# Patient Record
Sex: Male | Born: 1965 | Race: White | Hispanic: No | Marital: Married | Smoking: Former smoker
Health system: Southern US, Community
[De-identification: ages and names within clinical notes are randomized; demographics above are authoritative.]

## PROBLEM LIST (undated history)

## (undated) ENCOUNTER — Emergency Department (HOSPITAL_COMMUNITY): Payer: BC Managed Care – PPO | Source: Home / Self Care

## (undated) DIAGNOSIS — C61 Malignant neoplasm of prostate: Secondary | ICD-10-CM

## (undated) DIAGNOSIS — Z8709 Personal history of other diseases of the respiratory system: Secondary | ICD-10-CM

## (undated) DIAGNOSIS — J45909 Unspecified asthma, uncomplicated: Secondary | ICD-10-CM

## (undated) DIAGNOSIS — Z973 Presence of spectacles and contact lenses: Secondary | ICD-10-CM

## (undated) DIAGNOSIS — E119 Type 2 diabetes mellitus without complications: Secondary | ICD-10-CM

## (undated) DIAGNOSIS — C801 Malignant (primary) neoplasm, unspecified: Secondary | ICD-10-CM

## (undated) DIAGNOSIS — I10 Essential (primary) hypertension: Secondary | ICD-10-CM

## (undated) HISTORY — PX: EYE SURGERY: SHX253

## (undated) HISTORY — PX: TONSILLECTOMY: SUR1361

## (undated) HISTORY — PX: OTHER SURGICAL HISTORY: SHX169

---

## 2001-06-11 ENCOUNTER — Emergency Department (HOSPITAL_COMMUNITY): Admission: EM | Admit: 2001-06-11 | Discharge: 2001-06-11 | Payer: Self-pay | Admitting: *Deleted

## 2005-02-20 ENCOUNTER — Ambulatory Visit: Payer: Self-pay | Admitting: Internal Medicine

## 2005-07-06 ENCOUNTER — Ambulatory Visit: Payer: Self-pay | Admitting: Internal Medicine

## 2005-07-30 ENCOUNTER — Ambulatory Visit: Payer: Self-pay | Admitting: Internal Medicine

## 2006-10-02 ENCOUNTER — Ambulatory Visit: Payer: Self-pay | Admitting: Family Medicine

## 2008-03-27 ENCOUNTER — Telehealth: Payer: Self-pay | Admitting: *Deleted

## 2008-08-23 ENCOUNTER — Ambulatory Visit: Payer: Self-pay | Admitting: Internal Medicine

## 2008-08-23 DIAGNOSIS — E109 Type 1 diabetes mellitus without complications: Secondary | ICD-10-CM | POA: Insufficient documentation

## 2008-08-23 DIAGNOSIS — R062 Wheezing: Secondary | ICD-10-CM | POA: Insufficient documentation

## 2009-05-13 DIAGNOSIS — E785 Hyperlipidemia, unspecified: Secondary | ICD-10-CM | POA: Insufficient documentation

## 2009-08-22 ENCOUNTER — Encounter: Payer: Self-pay | Admitting: Family Medicine

## 2009-09-21 ENCOUNTER — Ambulatory Visit: Payer: Self-pay | Admitting: Family Medicine

## 2009-09-21 DIAGNOSIS — J309 Allergic rhinitis, unspecified: Secondary | ICD-10-CM | POA: Insufficient documentation

## 2009-09-21 DIAGNOSIS — J019 Acute sinusitis, unspecified: Secondary | ICD-10-CM

## 2009-09-23 ENCOUNTER — Ambulatory Visit: Payer: Self-pay | Admitting: Internal Medicine

## 2009-09-23 DIAGNOSIS — J45909 Unspecified asthma, uncomplicated: Secondary | ICD-10-CM | POA: Insufficient documentation

## 2009-09-23 DIAGNOSIS — R0789 Other chest pain: Secondary | ICD-10-CM | POA: Insufficient documentation

## 2009-09-26 ENCOUNTER — Telehealth (INDEPENDENT_AMBULATORY_CARE_PROVIDER_SITE_OTHER): Payer: Self-pay | Admitting: *Deleted

## 2009-10-01 ENCOUNTER — Telehealth (INDEPENDENT_AMBULATORY_CARE_PROVIDER_SITE_OTHER): Payer: Self-pay | Admitting: *Deleted

## 2009-10-02 ENCOUNTER — Ambulatory Visit: Payer: Self-pay | Admitting: Cardiology

## 2009-10-02 ENCOUNTER — Encounter: Payer: Self-pay | Admitting: Internal Medicine

## 2009-10-02 ENCOUNTER — Encounter (HOSPITAL_COMMUNITY): Admission: RE | Admit: 2009-10-02 | Discharge: 2009-12-17 | Payer: Self-pay | Admitting: Internal Medicine

## 2009-10-02 ENCOUNTER — Ambulatory Visit: Payer: Self-pay

## 2009-11-08 ENCOUNTER — Encounter: Payer: Self-pay | Admitting: Family Medicine

## 2009-11-18 ENCOUNTER — Ambulatory Visit: Payer: Self-pay | Admitting: Family Medicine

## 2010-02-25 ENCOUNTER — Ambulatory Visit: Payer: Self-pay | Admitting: Internal Medicine

## 2010-02-25 DIAGNOSIS — J069 Acute upper respiratory infection, unspecified: Secondary | ICD-10-CM | POA: Insufficient documentation

## 2010-07-15 NOTE — Assessment & Plan Note (Signed)
Summary: Sinusitis, rhinitis   Vital Signs:  Patient profile:   45 year old male Weight:      199 pounds O2 Sat:      96 % on Room air Temp:     100.3 degrees F oral Pulse rate:   91 / minute BP sitting:   148 / 84  (left arm) Cuff size:   large  Vitals Entered By: Payton Spark CMA (September 21, 2009 12:46 PM)  O2 Flow:  Room air CC: ? Sinus infction x 1 week. All OTC treatments not helping.    CC:  ? Sinus infction x 1 week. All OTC treatments not helping. [name]  History of Present Illness: ? Sinus infction x 1 week. All OTC treatments not helping.   Pain and pressure over the nasal bridge.  Soreness. Worse onteh left side.  Sxs started last Saturday. Nasal congestin.  Clear nasal d/c.  Used a nettipot this AM and didn't really help.  No ear pain or pressure. Had ST about 2 days ago but it better.  Yesterday took 2 sudafed.  Never checked his temperature. Feels he is about the same.   Current Medications (verified): 1)  Lantus Solostar 100 Unit/ml Soln (Insulin Glargine) 2)  Humalog 100 Unit/ml Soln (Insulin Lispro (Human)) 3)  Altace 10 Mg Caps (Ramipril) 4)  Proair Hfa 108 (90 Base) Mcg/act Aers (Albuterol Sulfate) .... Use As Directed As Needed  Allergies (verified): No Known Drug Allergies  Physical Exam  General:  Well-developed,well-nourished,in no acute distress; alert,appropriate and cooperative throughout examination Head:  Normocephalic and atraumatic without obvious abnormalities. No apparent alopecia or balding. Tender over the nasal bride and left maxillary sinus.  Eyes:  No corneal or conjunctival inflammation noted. EOMI. Perrla.  Ears:  External ear exam shows no significant lesions or deformities.  Otoscopic examination reveals clear canals, tympanic membranes are intact bilaterally without bulging, retraction, inflammation or discharge. Hearing is grossly normal bilaterally. Nose:  External nasal examination shows no deformity or inflammation. Nasal mucosa are  pink and moist without lesions or exudates. Mouth:  Oral mucosa and oropharynx without lesions or exudates.  Teeth in good repair. Neck:  No deformities, masses, or tenderness noted. Lungs:  Normal respiratory effort, chest expands symmetrically. Lungs are clear to auscultation, no crackles or wheezes. Heart:  Normal rate and regular rhythm. S1 and S2 normal without gallop, murmur, click, rub or other extra sounds. Skin:  no rashes.   Cervical Nodes:  No lymphadenopathy noted Psych:  Cognition and judgment appear intact. Alert and cooperative with normal attention span and concentration. No apparent delusions, illusions, hallucinations   Impression & Recommendations:  Problem # 1:  SINUSITIS - ACUTE-NOS (ICD-461.9) Dsicussed that we often don't tx with ABx unless has had sxs for 10 days but since not gettig better and his sxs are worse on one side will treat.  Call if not better in one week. Discussed starting his singulair and I do think part of his sxs are allergic and thi will help him get better more quickly is he treats his allergies as well.  The following medications were removed from the medication list:    Azithromycin 500 Mg Tabs (Azithromycin) .[name]... 2 by mouth x 1 then 1 by mouth once daily for another 4 days His updated medication list for this problem includes:    Amoxicillin 875 Mg Tabs (Amoxicillin) .[name]... Take 1 tablet by mouth two times a day for 10 days  Instructed on treatment. Call if symptoms  persist or worsen.   Problem # 2:  RHINITIS (ICD-477.9) Discussed starting his singulair and I do think part of his sxs are allergic and thi will help him get better more quickly is he treats his allergies as well.   Also highly recommended he be formally tested for asthma. Says Dr. Fabian Sharp has discussed this with him but he just hasn't done it.   Complete Medication List: 1)  Lantus Solostar 100 Unit/ml Soln (Insulin glargine) 2)  Humalog 100 Unit/ml Soln (Insulin lispro  (human)) 3)  Altace 10 Mg Caps (Ramipril) 4)  Proair Hfa 108 (90 Base) Mcg/act Aers (Albuterol sulfate) .... Use as directed as needed 5)  Amoxicillin 875 Mg Tabs (Amoxicillin) .... Take 1 tablet by mouth two times a day for 10 days Prescriptions: AMOXICILLIN 875 MG TABS (AMOXICILLIN) Take 1 tablet by mouth two times a day for 10 days  #20 x 0   Entered and Authorized by:   Nani Gasser MD   Signed by:   Nani Gasser MD on 09/21/2009   Method used:   Electronically to        [facility]  [address] [phone]* (retail)       [address]       [place], [place]  [address]       Ph: [phone] or [phone]       Fax: [phone]   RxID:   [id number]

## 2010-07-15 NOTE — Progress Notes (Signed)
Summary: Nuclear Pre-Procedure  Phone Note Outgoing Call Call back at Work Phone [phone]   Call placed by: Stanton Kidney, EMT-P,  October 01, 2009 1:45 PM Action Taken: Phone Call Completed Summary of Call: Left message with information on Myoview Information Sheet (see scanned document for details).     Nuclear Med Background Indications for Stress Test: Evaluation for Ischemia   History: Asthma   Symptoms: Chest Pain, SOB    Nuclear Pre-Procedure Cardiac Risk Factors: History of Smoking, IDDM Type 1 Height (in): 70

## 2010-07-15 NOTE — Assessment & Plan Note (Signed)
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   Dr. Evlyn Kanner is his endocrinologist.   Preventive Screening-Counseling & Management  Alcohol-Tobacco     Alcohol drinks/day: <1     >5/day in last 3 mos: Socially     Smoking Status: quit     Year Quit: 20 years ago  Caffeine-Diet-Exercise     Caffeine use/day: 2 diet coke     Does Patient Exercise: yes     Type of exercise: wts and crunches     Exercise (avg: min/session): <30     Times/week: 3  Hep-HIV-STD-Contraception     HIV Risk: no risk noted     STD Risk: no risk noted     Testicular SE Education/Counseling to perform regular STE      Sexual History:  currently monogamous.        Drug Use:  never.    Allergies (verified): No Known Drug Allergies  Past History:  Past medical, surgical, family and social histories (including risk factors) reviewed, and no changes noted (except as noted below).  Past Medical History: Reviewed history from 08/23/2008 and no changes required. Diabetes mellitus, type I hx of wheezing ? rad   Past Surgical History: Reviewed history from 08/23/2008 and no changes required. 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 Evlyn Kanner every 3 months   Family History: Reviewed history from 09/23/2009 and no changes required. Father: type 2 diabetes Mother: liver cancer Siblings: no  adopted   Social  History: Reviewed history from 09/23/2009 and no changes required. Married Former Smoker hh  of  3  STD Risk:  no risk noted HIV Risk:  no risk noted Sexual History:  currently monogamous Drug Use:  never  Review of Systems      See HPI General:  Denies chills, fatigue, and fever. Eyes:  Denies blurring. ENT:  Denies sinus pressure and sore throat. CV:  Denies chest pain or discomfort. Resp:  Denies cough. GI:  Denies nausea and vomiting. GU:  Denies decreased libido, dysuria, and erectile dysfunction. MS:  Denies joint pain. Derm:  does have a freely mobile area on the R upper shoulder. Neuro:  Denies tingling. Psych:  Denies anxiety and depression. Endo:  See HPI; DM, seeing endrocrine. Heme:  Denies abnormal bruising, enlarge lymph nodes, and fevers. Allergy:  Denies seasonal allergies.   Impression & Recommendations:  Problem # 1:  HEALTH MAINTENANCE EXAM (ICD-V70.0) The patient's preventative maintenance and recommended screening tests for an annual wellness exam were reviewed in full today. Brought up to date unless services declined.  Counselled on the importance of diet, exercise, and its role in overall health and mortality. The patient's FH and SH was reviewed, including their home life, tobacco status, and drug and alcohol status.   Add PSA to  labs from endocrine  Complete Medication List: 1)  Lantus Solostar 100 Unit/ml Soln (Insulin glargine) 2)  Humalog 100 Unit/ml Soln (Insulin lispro (human)) 3)  Altace 10 Mg Caps (Ramipril) 4)  Proair Hfa 108 (90 Base) Mcg/act Aers (Albuterol sulfate) .... Use as directed as needed  Current Allergies (reviewed today): No known allergies   Physical Exam General Appearance: well developed, well nourished, no acute distress Eyes: conjunctiva and lids normal, PERRLA, EOMI Ears, Nose, Mouth, Throat: TM clear, nares clear, oral exam WNL Neck: supple, no lymphadenopathy, no thyromegaly, no JVD Respiratory: clear to  auscultation and percussion, respiratory effort normal Cardiovascular: regular rate and rhythm, S1-S2, no murmur, rub or gallop, no bruits, peripheral pulses normal and symmetric, no cyanosis, clubbing, edema or varicosities Chest: no scars, masses, tenderness; no asymmetry, skin changes, nipple discharge, no gynecomastia   Gastrointestinal: soft, non-tender; no hepatosplenomegaly, masses; active bowel sounds all quadrants, guaiac negative stool; no masses, tenderness, hemorrhoids  Genitourinary: no hernia, testicular mass, penile discharge, priapism or prostate enlargement Lymphatic: no cervical, axillary or inguinal adenopathy Musculoskeletal: gait normal, muscle tone and strength WNL, no joint swelling, effusions, discoloration, crepitus  Skin: clear, good turgor, color WNL, no rashes, lesions, or ulcerations Neurologic: normal mental status, normal reflexes, normal strength, sensation, and motion Psychiatric: alert; oriented to person, place and time Other Exam:

## 2010-07-15 NOTE — Assessment & Plan Note (Signed)
Summary: Cardiology Nuclear Study  Nuclear Med Background Indications for Stress Test: Evaluation for Ischemia   History: Asthma   Symptoms: Chest Pain, DOE, SOB    Nuclear Pre-Procedure Cardiac Risk Factors: History of Smoking, IDDM Type 1 Caffeine/Decaff Intake: None NPO After: 8:00 AM Lungs: clear IV 0.9% NS with Angio Cath: 22g     IV Site: (R) AC IV Started by: Irean Hong RN Chest Size (in) 44     Height (in): 70 Weight (lb): 200 BMI: 28.80 Tech Comments: FBS 228 at 11:30 am.  Nuclear Med Study 1 or 2 day study:  1 day     Stress Test Type:  Stress Reading MD:  Olga Millers, MD     Referring MD:  W.[facility] Resting Radionuclide:  Technetium 8m Tetrofosmin     Resting Radionuclide Dose:  11 mCi  Stress Radionuclide:  Technetium 2m Tetrofosmin     Stress Radionuclide Dose:  33 mCi   Stress Protocol Exercise Time (min):  8:01 min     Max HR:  157 bpm     Predicted Max HR:  178 bpm  Max Systolic BP: 216 mm Hg     Percent Max HR:  88.20 %     METS: 10.10 Rate Pressure Product:  16109    Stress Test Technologist:  Milana Na EMT-P     Nuclear Technologist:  Domenic Polite CNMT  Rest Procedure  Myocardial perfusion imaging was performed at rest 45 minutes following the intravenous administration of Myoview Technetium 33m Tetrofosmin.  Stress Procedure  The patient exercised for 8:01. The patient stopped due to fatigue and denied any chest pain.  There were no significant ST-T wave changes.  Myoview was injected at peak exercise and myocardial perfusion imaging was performed after a brief delay.  QPS Raw Data Images:  Acuisition technically good; normal left ventricular size. Stress Images:  There is normal uptake in all areas. Rest Images:  Normal homogeneous uptake in all areas of the myocardium. Subtraction (SDS):  No evidence of ischemia. Transient Ischemic Dilatation:  1.03  (Normal <1.22)  Lung/Heart Ratio:  .29  (Normal <0.45)  Quantitative Gated  Spect Images QGS EDV:  110 ml QGS ESV:  47 ml QGS EF:  57 % QGS cine images:  Normal wall motion.   Overall Impression  Exercise Capacity: Good exercise capacity. BP Response: Hypertensive blood pressure response. Clinical Symptoms: No chest pain ECG Impression: No significant ST segment change suggestive of ischemia. Overall Impression: There is no sign of scar or ischemia.  Appended Document: Cardiology Nuclear Study Tell patient that results of stress test are normal.   Did have some excess elevation of BP .    Check BP readings   to make sure they are normal. If pain is persistent or  progressive  then rov or see dr Dallas Schimke   Appended Document: Cardiology Nuclear Study LMT[facility]B  Appended Document: Cardiology Nuclear Study Pt aware of results.

## 2010-07-15 NOTE — Progress Notes (Signed)
Summary: Nuclear Pre-Procedure  Phone Note Outgoing Call Call back at Work Phone [phone]   Call placed by: Stanton Kidney, EMT-P,  September 26, 2009 1:21 PM Action Taken: Phone Call Completed Summary of Call: Reviewed information on Myoview Information Sheet (see scanned document for further details).  Spoke with Patient, R/S to 10/02/09 at 12:30p.     Nuclear Med Background Indications for Stress Test: Evaluation for Ischemia   History: Asthma   Symptoms: Chest Pain, SOB    Nuclear Pre-Procedure Cardiac Risk Factors: History of Smoking, IDDM Type 1 Height (in): 70

## 2010-07-15 NOTE — Assessment & Plan Note (Signed)
Summary: CHEST TIGHTNESS // RS   Vital Signs:  Patient profile:   45 year old male Height:      70 inches Weight:      203 pounds BMI:     29.23 O2 Sat:      99 % on Room air Pulse rate:   106 / minute BP sitting:   144 / 84  (left arm) Cuff size:   large  Vitals Entered By: Romualdo Bolk, CMA (AAMA) (September 23, 2009 11:48 AM)  O2 Flow:  Room air CC: Sinus infection x 1 week seen at [facility]. Pt states that heart rate was elevated. Pt states that he having chest pressure for 1 week. Then every once in awhile he gets chest pains that has been going on for 1 month.   History of Present Illness: [name] comesin for acute visit  because of concern about  cause  of  some left sided chest pressure.    worried about cv causes .   pressure when lays down esp in left side of chest . Has a sinus infection a week ago and was [facility]  and [facility]  with antibiotic   and BP was elevated  .   No  related exercise change  recently  . has had  some chest / discomfort off and on over the last months . this new cp  came with the uri.  Asthma  not bothering him . DM  :  a1c was 7.8.Marland Kitchen  sees Dr Evlyn Kanner .      when tries to get below 7  has had hypoglycemia.     has never had a stress test   but was supposed to do  this . Unsure why not done.   Preventive Screening-Counseling & Management  Alcohol-Tobacco     Alcohol drinks/day: <1     >5/day in last 3 mos: Socially     Smoking Status: quit     Year Quit: 20 years ago  Caffeine-Diet-Exercise     Caffeine use/day: 2 diet coke     Does Patient Exercise: yes     Type of exercise: wts and crunches     Exercise (avg: min/session): 30-60     Times/week: 3  EKG  Procedure date:  09/23/2009  Findings:      Normal sinus rhythm with rate of:  86  Current Medications (verified): 1)  Lantus Solostar 100 Unit/ml Soln (Insulin Glargine) 2)  Humalog 100 Unit/ml Soln (Insulin Lispro (Human)) 3)  Altace 10 Mg Caps (Ramipril) 4)  Proair  Hfa 108 (90 Base) Mcg/act Aers (Albuterol Sulfate) .... Use As Directed As Needed 5)  Amoxicillin 875 Mg Tabs (Amoxicillin) .... Take 1 Tablet By Mouth Two Times A Day For 10 Days  Allergies (verified): No Known Drug Allergies  Past History:  Past medical, surgical, family and social histories (including risk factors) reviewed, and no changes noted (except as noted below).  Past Medical History: Reviewed history from 08/23/2008 and no changes required. Diabetes mellitus, type I hx of wheezing ? rad   Past Surgical History: Reviewed history from 08/23/2008 and no changes required. 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 Evlyn Kanner every 3 months   Family History: Reviewed history from 08/23/2008 and no changes required. Father: type 2 diabetes Mother: liver cancer Siblings: no  adopted   Social History: Reviewed history from 08/23/2008 and no changes required. Married Former  Smoker hh  of  3   Review of Systems       The patient complains of chest pain.  The patient denies anorexia, fever, weight loss, weight gain, vision loss, decreased hearing, hoarseness, syncope, dyspnea on exertion, peripheral edema, abdominal pain, melena, hematochezia, severe indigestion/heartburn, difficulty walking, depression, unusual weight change, abnormal bleeding, enlarged lymph nodes, and angioedema.    Physical Exam  General:  Well-developed,well-nourished,in no acute distress; alert,appropriate and cooperative throughout examination Head:  normocephalic and atraumatic.   Eyes:  vision grossly intact and pupils equal.   Nose:  no nasal discharge.  congested face non tender  Mouth:  pharynx pink and moist.   Neck:  No deformities, masses, or tenderness noted. Chest Wall:  no deformities, no tenderness, and no masses.   Lungs:  Normal respiratory effort, chest expands symmetrically. Lungs are clear to auscultation,  no crackles or wheezes.no dullness.   Heart:  Normal rate and regular rhythm. S1 and S2 normal without gallop, murmur, click, rub or other extra sounds.no lifts.   Abdomen:  Bowel sounds positive,abdomen soft and non-tender without masses, organomegaly or  noted. Pulses:  pulses intact without delay   Extremities:  no clubbing cyanosis or edema  Neurologic:  non focal grossly  Skin:  turgor normal, color normal, no ecchymoses, and no petechiae.   Cervical Nodes:  No lymphadenopathy noted Psych:  Oriented X3, normally interactive, good eye contact, not depressed appearing, and slightly anxious.     Impression & Recommendations:  Problem # 1:  CHEST DISCOMFORT (ICD-786.59) unsure cause      nl exam   rec stress test as his risk of diabetes and  re further testing for risk assessment . Orders: T-2 View CXR ([id number]) Cardiology Referral (Cardiology) EKG w/ Interpretation (93000)  Problem # 2:  SINUSITIS - ACUTE-NOS (ICD-461.9) Assessment: Improved  His updated medication list for this problem includes:    Amoxicillin 875 Mg Tabs (Amoxicillin) .Marland Kitchen... Take 1 tablet by mouth two times a day for 10 days  Problem # 3:  ASTHMA (ICD-493.90) probable     His updated medication list for this problem includes:    Proair Hfa 108 (90 Base) Mcg/act Aers (Albuterol sulfate) ..... Use as directed as needed  Problem # 4:  RHINITIS (ICD-477.9) seems allergic     Problem # 5:  DIABETES MELLITUS, TYPE I (ICD-250.01) under care per endocrine   and  says doesnt need to be on lipid therapy at this time .  on ace inhibitor.  His updated medication list for this problem includes:    Lantus Solostar 100 Unit/ml Soln (Insulin glargine)    Humalog 100 Unit/ml Soln (Insulin lispro (human))    Altace 10 Mg Caps (Ramipril)  Orders: Cardiology Referral (Cardiology) EKG w/ Interpretation (93000)  Complete Medication List: 1)  Lantus Solostar 100 Unit/ml Soln (Insulin glargine) 2)  Humalog 100 Unit/ml Soln  (Insulin lispro (human)) 3)  Altace 10 Mg Caps (Ramipril) 4)  Proair Hfa 108 (90 Base) Mcg/act Aers (Albuterol sulfate) .... Use as directed as needed 5)  Amoxicillin 875 Mg Tabs (Amoxicillin) .... Take 1 tablet by mouth two times a day for 10 days  Patient Instructions: 1)  this discomfort seems not cardiac   but because of your risk factors  will arrange to either get a stress test or get cardiology  to see you.  2)  would get chest x ray    in the meantime.

## 2010-07-15 NOTE — Assessment & Plan Note (Signed)
Summary: SINUSITIS/DLO   Vital Signs:  Patient profile:   45 year old male Weight:      201 pounds Temp:     98.4 degrees F oral Pulse rate:   76 / minute Pulse rhythm:   regular BP sitting:   102 / 58  (left arm) Cuff size:   large  Vitals Entered By: Selena Batten Dance CMA Duncan Dull) (February 25, 2010 4:14 PM) CC: ? Sinus infection (symptoms x5 days)   History of Present Illness: CC: sinus infx?  5-6d h/o ST transitioning to itchy ears.  A little hoarse as well.  No lung involvement yet.  Doesn't think it's viral, wife got abx despite negative rapid strep.  Now ST only in am.  + fever/chill saturday.  + coughing, mildly productive.  Not significant RN, congestion.  + sinus pressure next to nose.  No abd pain, n/v/d.  No ear pain, tooth pain.    T1DM, possible h/o asthma.  A1c low 7's.  No smokers at home.  No new pets, no h/o allergies.  + wife getting over similar sickness at home  Current Medications (verified): 1)  Lantus Solostar 100 Unit/ml Soln (Insulin Glargine) 2)  Humalog 100 Unit/ml Soln (Insulin Lispro (Human)) 3)  Altace 10 Mg Caps (Ramipril) 4)  Proair Hfa 108 (90 Base) Mcg/act Aers (Albuterol Sulfate) .... Use As Directed As Needed  Allergies (verified): No Known Drug Allergies  Past History:  Past Medical History: Last updated: 08/23/2008 Diabetes mellitus, type I hx of wheezing ? rad  PMH-FH-SH reviewed for relevance  Review of Systems       per HPI  Physical Exam  General:  Well-developed,well-nourished,in no acute distress; alert,appropriate and cooperative throughout examination Head:  normocephalic and atraumatic.   Eyes:  vision grossly intact and pupils equal.   Ears:  External ear exam shows no significant lesions or deformities.  Otoscopic examination reveals clear canals, tympanic membranes are intact bilaterally without bulging, retraction, inflammation or discharge. Hearing is grossly normal bilaterally. Nose:  no nasal discharge.  congested  face non tender  Mouth:  pharynx pink, moist. + PND Neck:  No deformities, masses, or tenderness noted. Lungs:  Normal respiratory effort, chest expands symmetrically. Lungs are clear to auscultation, no crackles or wheezes.no dullness.   Heart:  Normal rate and regular rhythm. S1 and S2 normal without gallop, murmur, click, rub or other extra sounds. Pulses:  pulses intact without delay   Extremities:  no clubbing cyanosis or edema  Skin:  turgor normal, color normal, no ecchymoses, and no petechiae.     Impression & Recommendations:  Problem # 1:  VIRAL URI (ICD-465.9) Instructed on symptomatic treatment. Call if symptoms persist or worsen.  treat symptomatically, push rest and fluids.  advised likely run its course 7-10 days, cough can last longer.  Provided with WASP for azithro to cover sinusitis/bronchitis given h/o T1DM, increased risk for complications.  Last A1c per patient recall low 7's.  His updated medication list for this problem includes:    Fenesin Ir 400 Mg Tabs (Guaifenesin) .Marland Kitchen... 1 1/2 pills at 8am and at noon, with fluid for cough    Cheratussin Ac 100-10 Mg/38ml Syrp (Guaifenesin-codeine) ..... One teaspoon by mouth at bedtime as needed cough  Complete Medication List: 1)  Lantus Solostar 100 Unit/ml Soln (Insulin glargine) 2)  Humalog 100 Unit/ml Soln (Insulin lispro (human)) 3)  Altace 10 Mg Caps (Ramipril) 4)  Proair Hfa 108 (90 Base) Mcg/act Aers (Albuterol sulfate) .... Use as directed as  needed 5)  Fenesin Ir 400 Mg Tabs (Guaifenesin) .Marland Kitchen.. 1 1/2 pills at 8am and at noon, with fluid for cough 6)  Cheratussin Ac 100-10 Mg/57ml Syrp (Guaifenesin-codeine) .... One teaspoon by mouth at bedtime as needed cough 7)  Zithromax Z-pak 250 Mg Tabs (Azithromycin) .... Use as directed  Patient Instructions: 1)  Sounds like you have a viral upper respiratory infection.  however given your diabetes, you are at increased risk of complications. 2)  Antibiotics are not needed for  this, but we will give you a prescription to hold in case you are not improving as expected.  Viral infections usually take 7-10 days to resolve.  The cough can last up to 4-6 weeks to go away. 3)  Use medication as prescribed: guaifenesin IR 400mg  in am, then cough suppressant at night for rest. 4)  Ensure you get plenty of fluids and rest. 5)  Please return if you are not improving as expected, or if you have high fevers (>101.5) or difficulty swallowing, drooling or trouble opening mouth. 6)  Call clinic with questions.  Pleasure to see you today!  Prescriptions: ZITHROMAX Z-PAK 250 MG TABS (AZITHROMYCIN) use as directed  #1 x 0   Entered and Authorized by:   Eustaquio Boyden  MD   Signed by:   Eustaquio Boyden  MD on 02/25/2010   Method used:   Print then Give to Patient   RxID:   [id number] Christena Deem Z-PAK 250 MG TABS (AZITHROMYCIN) use as directed  #1 x 0   Entered and Authorized by:   Eustaquio Boyden  MD   Signed by:   Eustaquio Boyden  MD on 02/25/2010   Method used:   Electronically to        [facility] [address] [phone]* (retail)       [address]       [place], [place]  [address]       Ph: [phone] or [phone]       Fax: [phone]   RxID:   [id number] CHERATUSSIN AC 100-10 MG/5ML SYRP (GUAIFENESIN-CODEINE) one teaspoon by mouth at bedtime as needed cough  #100cc x 0   Entered and Authorized by:   Eustaquio Boyden  MD   Signed by:   Eustaquio Boyden  MD on 02/25/2010   Method used:   Print then Give to Patient   RxID:   [id number] FENESIN IR 400 MG TABS (GUAIFENESIN) 1 1/2 pills at 8am and at noon, with fluid for cough  #30 x 0   Entered and Authorized by:   Eustaquio Boyden  MD   Signed by:   Eustaquio Boyden  MD on 02/25/2010   Method used:   [facility]ronically to        [facility] [address] [phone]* (retail)       [address]       [place], [place]  [address]       Ph: [phone] or [phone]       Fax: [phone]   RxID:    [id number]   Current Allergies (reviewed today): No known allergies

## 2010-07-15 NOTE — Letter (Signed)
Summary: Patient Questionnaire  Patient Questionnaire   Imported By: Beau Fanny 08/22/2009 16:25:30  _____________________________________________________________________  External Attachment:    Type:   Image     Comment:   External Document

## 2011-03-09 DIAGNOSIS — Z800 Family history of malignant neoplasm of digestive organs: Secondary | ICD-10-CM | POA: Insufficient documentation

## 2015-07-15 ENCOUNTER — Other Ambulatory Visit: Payer: Self-pay | Admitting: Urology

## 2015-07-15 DIAGNOSIS — C61 Malignant neoplasm of prostate: Secondary | ICD-10-CM

## 2015-07-22 ENCOUNTER — Encounter (HOSPITAL_COMMUNITY)
Admission: RE | Admit: 2015-07-22 | Discharge: 2015-07-22 | Disposition: A | Discharge status: Home / Self Care | Payer: BC Managed Care – PPO | Source: Ambulatory Visit | Attending: Urology | Admitting: Urology

## 2015-07-22 DIAGNOSIS — C61 Malignant neoplasm of prostate: Secondary | ICD-10-CM | POA: Diagnosis not present

## 2015-07-22 MED ORDER — TECHNETIUM TC 99M MEDRONATE IV KIT
25.0000 | PACK | Freq: Once | INTRAVENOUS | Status: AC | PRN
  Administered 2015-07-22: 27 via INTRAVENOUS

## 2015-08-01 ENCOUNTER — Other Ambulatory Visit: Payer: Self-pay | Admitting: Urology

## 2015-08-13 ENCOUNTER — Ambulatory Visit
Admission: RE | Admit: 2015-08-13 | Payer: BC Managed Care – PPO | Source: Ambulatory Visit | Admitting: Radiation Oncology

## 2015-08-13 ENCOUNTER — Inpatient Hospital Stay
Admission: RE | Admit: 2015-08-13 | Payer: BC Managed Care – PPO | Source: Ambulatory Visit | Admitting: Radiation Oncology

## 2015-08-22 ENCOUNTER — Encounter (HOSPITAL_COMMUNITY)
Admission: RE | Admit: 2015-08-22 | Discharge: 2015-08-22 | Disposition: A | Discharge status: Home / Self Care | Payer: BC Managed Care – PPO | Source: Ambulatory Visit | Attending: Urology | Admitting: Urology

## 2015-08-22 ENCOUNTER — Encounter (HOSPITAL_COMMUNITY): Payer: Self-pay

## 2015-08-22 ENCOUNTER — Ambulatory Visit (HOSPITAL_COMMUNITY)
Admission: RE | Admit: 2015-08-22 | Discharge: 2015-08-22 | Disposition: A | Discharge status: Home / Self Care | Payer: BC Managed Care – PPO | Source: Ambulatory Visit | Attending: Urology | Admitting: Urology

## 2015-08-22 DIAGNOSIS — Z01810 Encounter for preprocedural cardiovascular examination: Secondary | ICD-10-CM | POA: Diagnosis not present

## 2015-08-22 DIAGNOSIS — I10 Essential (primary) hypertension: Secondary | ICD-10-CM | POA: Diagnosis not present

## 2015-08-22 HISTORY — DX: Malignant (primary) neoplasm, unspecified: C80.1

## 2015-08-22 HISTORY — DX: Type 2 diabetes mellitus without complications: E11.9

## 2015-08-22 HISTORY — DX: Presence of spectacles and contact lenses: Z97.3

## 2015-08-22 HISTORY — DX: Unspecified asthma, uncomplicated: J45.909

## 2015-08-22 HISTORY — DX: Personal history of other diseases of the respiratory system: Z87.09

## 2015-08-22 HISTORY — DX: Essential (primary) hypertension: I10

## 2015-08-22 LAB — BASIC METABOLIC PANEL WITH GFR
Anion gap: 9 (ref 5–15)
BUN: 13 mg/dL (ref 6–20)
CO2: 25 mmol/L (ref 22–32)
Calcium: 9 mg/dL (ref 8.9–10.3)
Chloride: 104 mmol/L (ref 101–111)
Creatinine, Ser: 0.86 mg/dL (ref 0.61–1.24)
GFR calc Af Amer: >60 mL/min
GFR calc non Af Amer: >60 mL/min
Glucose, Bld: 149 mg/dL — ABNORMAL HIGH (ref 65–99)
Potassium: 4.3 mmol/L (ref 3.5–5.1)
Sodium: 138 mmol/L (ref 135–145)

## 2015-08-22 LAB — CBC
HCT: 39.1 % (ref 39.0–52.0)
Hemoglobin: 14.2 g/dL (ref 13.0–17.0)
MCH: 31.3 pg (ref 26.0–34.0)
MCHC: 36.3 g/dL — ABNORMAL HIGH (ref 30.0–36.0)
MCV: 86.1 fL (ref 78.0–100.0)
Platelets: 306 K/uL (ref 150–400)
RBC: 4.54 MIL/uL (ref 4.22–5.81)
RDW: 12.4 % (ref 11.5–15.5)
WBC: 4.9 K/uL (ref 4.0–10.5)

## 2015-08-22 LAB — TYPE AND SCREEN
ABO/RH(D): O POS
Antibody Screen: NEGATIVE

## 2015-08-22 LAB — ABO/RH: ABO/RH(D): O POS

## 2015-08-22 NOTE — Patient Instructions (Addendum)
[name]  08/22/2015   Your procedure is scheduled on: Monday September 02, 2015  Report to [facility]  Entrance take [place]  elevators to 3rd floor to  [name] at 5:15 AM.  Call this number if you have problems the morning of surgery [phone]   Remember: ONLY 1 PERSON MAY GO WITH YOU TO SHORT STAY TO GET  READY MORNING OF [name].  Do not eat food or drink liquids :After Midnight.    : TAKE 1/2 EVENING DOSE OF INSULIN NIGHT PRIOR TO SURGERY; MAY USE PROAIR INHALER IF NEEDED MORNING OF SURGERY (bring with you day of surgery) DO NOT TAKE ANY DIABETIC MEDICATIONS DAY OF YOUR SURGERY                               You may not have any metal on your body including hair pins and              piercings  Do not wear jewelry,  lotions, powders or colognes, deodorant                        Men may shave face and neck.   Do not bring valuables to the hospital. [name].  Contacts, dentures or bridgework may not be worn into surgery.  Leave suitcase in the car. After surgery it may be brought to your room.    Special Instructions: FOLLOW SURGEON'S INSTRUCTION IN REGARDS TO BOWEL PREPARATION PRIOR TO SURGICAL PROCEDURE;   8-10 OZ MAGNESIUM CITRATE BY NOON DAY BEFORE PROCEDURE; FLEETS EENEMA NIGHT BEFORE PROCEDURE              Please read over the following fact sheets you were given:INCENTIVE SPIROMETER; BLOOD TRANSFUSION INFORMATION SHEET  _____________________________________________________________________             [facility] - Preparing for Surgery Before surgery, you can play an important role.  Because skin is not sterile, your skin needs to be as free of germs as possible.  You can reduce the number of germs on your skin by washing with CHG (chlorahexidine gluconate) soap before surgery.  CHG is an antiseptic cleaner which kills germs and bonds with the skin to continue killing germs even  after washing. Please DO NOT use if you have an allergy to CHG or antibacterial soaps.  If your skin becomes reddened/irritated stop using the CHG and inform your nurse when you arrive at Short Stay. Do not shave (including legs and underarms) for at least 48 hours prior to the first CHG shower.  You may shave your face/neck. Please follow these instructions carefully:  1.  Shower with CHG Soap the night before surgery and the  morning of Surgery.  2.  If you choose to wash your hair, wash your hair first as usual with your  normal  shampoo.  3.  After you shampoo, rinse your hair and body thoroughly to remove the  shampoo.                           4.  Use CHG as you would any other liquid soap.  You can apply chg directly  to the skin and wash                       Gently with a scrungie or clean washcloth.  5.  Apply the CHG Soap to your body ONLY FROM THE NECK DOWN.   Do not use on face/ open                           Wound or open sores. Avoid contact with eyes, ears mouth and genitals (private parts).                       Wash face,  Genitals (private parts) with your normal soap.             6.  Wash thoroughly, paying special attention to the area where your surgery  will be performed.  7.  Thoroughly rinse your body with warm water from the neck down.  8.  DO NOT shower/wash with your normal soap after using and rinsing off  the CHG Soap.                9.  Pat yourself dry with a clean towel.            10.  Wear clean pajamas.            11.  Place clean sheets on your bed the night of your first shower and do not  sleep with pets. Day of Surgery : Do not apply any lotions/deodorants the morning of surgery.  Please wear clean clothes to the hospital/surgery center.  FAILURE TO FOLLOW THESE INSTRUCTIONS MAY RESULT IN THE CANCELLATION OF YOUR SURGERY PATIENT SIGNATURE_________________________________  NURSE  SIGNATURE__________________________________  ________________________________________________________________________   [name]  An incentive spirometer is a tool that can help keep your lungs clear and active. This tool measures how well you are filling your lungs with each breath. Taking long deep breaths may help reverse or decrease the chance of developing breathing (pulmonary) problems (especially infection) following:  A long period of time when you are unable to move or be active. BEFORE THE PROCEDURE   If the spirometer includes an indicator to show your best effort, your nurse or respiratory therapist will set it to a desired goal.  If possible, sit up straight or lean slightly forward. Try not to slouch.  Hold the incentive spirometer in an upright position. INSTRUCTIONS FOR USE   Sit on the edge of your bed if possible, or sit up as far as you can in bed or on a chair.  Hold the incentive spirometer in an upright position.  Breathe out normally.  Place the mouthpiece in your mouth and seal your lips tightly around it.  Breathe in slowly and as deeply as possible, raising the piston or the ball toward the top of the column.  Hold your breath for 3-5 seconds or for as long as possible. Allow the piston or ball to fall to the bottom of the column.  Remove the mouthpiece from your mouth and breathe out normally.  Rest for a few seconds and repeat Steps 1 through 7 at least 10 times every 1-2 hours when you are awake. Take your time and take a few normal breaths between deep breaths.  The spirometer may include an indicator to show your best effort. Use the indicator as a goal to work toward during each repetition.  After each  set of 10 deep breaths, practice coughing to be sure your lungs are clear. If you have an incision (the cut made at the time of surgery), support your incision when coughing by placing a pillow or rolled up towels firmly against it. Once  you are able to get out of bed, walk around indoors and cough well. You may stop using the incentive spirometer when instructed by your caregiver.  RISKS AND COMPLICATIONS  Take your time so you do not get dizzy or light-headed.  If you are in pain, you may need to take or ask for pain medication before doing incentive spirometry. It is harder to take a deep breath if you are having pain. AFTER USE  Rest and breathe slowly and easily.  It can be helpful to keep track of a log of your progress. Your caregiver can provide you with a simple table to help with this. If you are using the spirometer at home, follow these instructions: [place] IF:   You are having difficultly using the spirometer.  You have trouble using the spirometer as often as instructed.  Your pain medication is not giving enough relief while using the spirometer.  You develop fever of 100.5 F (38.1 C) or higher. SEEK IMMEDIATE MEDICAL CARE IF:   You cough up bloody sputum that had not been present before.  You develop fever of 102 F (38.9 C) or greater.  You develop worsening pain at or near the incision site. MAKE SURE YOU:   Understand these instructions.  Will watch your condition.  Will get help right away if you are not doing well or get worse. Document Released: 10/12/2006 Document Revised: 08/24/2011 Document Reviewed: 12/13/2006 ExitCare Patient Information 2014 ExitCare, [place].   ________________________________________________________________________  WHAT IS A BLOOD TRANSFUSION? Blood Transfusion Information  A transfusion is the replacement of blood or some of its parts. Blood is made up of multiple cells which provide different functions.  Red blood cells carry oxygen and are used for blood loss replacement.  White blood cells fight against infection.  Platelets control bleeding.  Plasma helps clot blood.  Other blood products are available for specialized needs, such as  hemophilia or other clotting disorders. BEFORE THE TRANSFUSION  Who gives blood for transfusions?   Healthy volunteers who are fully evaluated to make sure their blood is safe. This is blood bank blood. Transfusion therapy is the safest it has ever been in the practice of medicine. Before blood is taken from a donor, a complete history is taken to make sure that person has no history of diseases nor engages in risky social behavior (examples are intravenous drug use or sexual activity with multiple partners). The donor's travel history is screened to minimize risk of transmitting infections, such as malaria. The donated blood is tested for signs of infectious diseases, such as HIV and hepatitis. The blood is then tested to be sure it is compatible with you in order to minimize the chance of a transfusion reaction. If you or a relative donates blood, this is often done in anticipation of surgery and is not appropriate for emergency situations. It takes many days to process the donated blood. RISKS AND COMPLICATIONS Although transfusion therapy is very safe and saves many lives, the main dangers of transfusion include:   Getting an infectious disease.  Developing a transfusion reaction. This is an allergic reaction to something in the blood you were given. Every precaution is taken to prevent this. The decision to have  a blood transfusion has been considered carefully by your caregiver before blood is given. Blood is not given unless the benefits outweigh the risks. AFTER THE TRANSFUSION  Right after receiving a blood transfusion, you will usually feel much better and more energetic. This is especially true if your red blood cells have gotten low (anemic). The transfusion raises the level of the red blood cells which carry oxygen, and this usually causes an energy increase.  The nurse administering the transfusion will monitor you carefully for complications. HOME CARE INSTRUCTIONS  No special  instructions are needed after a transfusion. You may find your energy is better. Speak with your caregiver about any limitations on activity for underlying diseases you may have. SEEK MEDICAL CARE IF:   Your condition is not improving after your transfusion.  You develop redness or irritation at the intravenous (IV) site. SEEK IMMEDIATE MEDICAL CARE IF:  Any of the following symptoms occur over the next 12 hours:  Shaking chills.  You have a temperature by mouth above 102 F (38.9 C), not controlled by medicine.  Chest, back, or muscle pain.  People around you feel you are not acting correctly or are confused.  Shortness of breath or difficulty breathing.  Dizziness and fainting.  You get a rash or develop hives.  You have a decrease in urine output.  Your urine turns a dark color or changes to pink, red, or brown. Any of the following symptoms occur over the next 10 days:  You have a temperature by mouth above 102 F (38.9 C), not controlled by medicine.  Shortness of breath.  Weakness after normal activity.  The white part of the eye turns yellow (jaundice).  You have a decrease in the amount of urine or are urinating less often.  Your urine turns a dark color or changes to pink, red, or brown. Document Released: 05/29/2000 Document Revised: 08/24/2011 Document Reviewed: 01/16/2008 [facility] Patient Information 2014 [place], [place].  _______________________________________________________________________

## 2015-08-23 LAB — HEMOGLOBIN A1C
Hgb A1c MFr Bld: 7.9 % — ABNORMAL HIGH (ref 4.8–5.6)
Mean Plasma Glucose: 180 mg/dL

## 2015-08-30 NOTE — H&P (Signed)
  Chief Complaint Prostate Cancer     History of Present Illness [name] is a 50 year old gentleman with an elevated PSA of 6.1 and a small confined prostate nodule at the right mid prostate. He underwent a TRUS biopsy of the prostate by Dr. Louis Meckel on 07/05/15 confirming Gleason 4+4=8 adenocarcinoma of the prostate with 9 out of 12 biopsy cores positive for malignancy. He has no family history of prostate cancer. He underwent staging studies including a bone scan and CT scan of the pelvis. His bone scan on 07/22/15 demonstrated no evidence of bone metastases and his CT scan of the pelvis from 07/22/15 demonstrated also demonstrated no lymphadenopathy or evidence of pelvic metastases.  History of okay and then nylon in his abdominal dista  His PMH is significant for insulin dependent diabetes and hypertension  TNM stage: cT2a N0 M0 PSA: 6.1  Gleason score: 4+4=8 Biopsy (07/05/15): 9/12 cores positive   Left: L apex (5%, 3+3=6), L mid (5%, 3+3=6), L lateral base (5%, 3+3=6)   Right: R apex (30%, 4+3=7, PNI), R lateral apex (40%, 4+3=7), R mid (60%, 3+4=7), R lateral mid (90%, 3+4=7), R base (50%, 4+3=7), R lateral base (70%, 4+4=8) Prostate volume: 27 cc  Nomogram OC disease: 14% EPE: 84% SVI: 39% LNI: 39% PFS (surgery): 33% at 5 years, 21% at 10 years  Urinary function: IPSS is 11. Erectile funciton: SHIM score is 11. He does take PDE 5 inhibitors and estimates that he can achieve erections adequate for intercourse approximately 70% a time with medical therapy. He is unable to achieve adequate erections without medication.   Past Medical History Problems  1. History of diabetes mellitus (Z86.39) 2. History of hypertension (Z86.79)  Surgical History Problems  1. History of Inguinal Hernia Repair  He has undergone a prior open inguinal hernia repair.   Current Meds 1. NovoLOG 100 UNIT/ML Subcutaneous Solution;  Therapy: (Recorded:20Dec2016) to Recorded 2. ProAir HFA 108 (90  Base) MCG/ACT Inhalation Aerosol Solution;  Therapy: (Recorded:20Dec2016) to Recorded 3. Ramipril 10 MG Oral Capsule;  Therapy: (Recorded:20Dec2016) to Recorded 4. Tyler Aas FlexTouch 100 UNIT/ML Subcutaneous Solution Pen-injector;  Therapy: (Recorded:20Dec2016) to Recorded  Allergies Medication  1. No Known Drug Allergies  Family History He is adopted and has no knowledge of his family medical history.   Social History Problems    Alcohol use (Z78.9)   3   Married   Never a smoker   Number of children   1 son   Occupation   Press photographer  Review of Systems Genitourinary, constitutional, skin, eye, otolaryngeal, hematologic/lymphatic, cardiovascular, pulmonary, endocrine, musculoskeletal, gastrointestinal, neurological and psychiatric system(s) were reviewed and pertinent findings if present are noted and are otherwise negative.      Physical Exam Constitutional: Well nourished and well developed . No acute distress.  ENT:. The ears and nose are normal in appearance.  Neck: The appearance of the neck is normal and no neck mass is present.  Pulmonary: No respiratory distress, normal respiratory rhythm and effort and clear bilateral breath sounds.  Cardiovascular: Heart rate and rhythm are normal . No peripheral edema.  Abdomen: The abdomen is soft and nontender. No masses are palpated. No CVA tenderness. No hernias are palpable. No hepatosplenomegaly noted.      Discussion/Summary 1. High risk, localized prostate cancer:   He does wish to proceed with surgical therapy. He will proceed with a unilateral left nerve sparing robot-assisted laparoscopic radical prostatectomy and bilateral pelvic lymphadenectomy.

## 2015-08-31 NOTE — Anesthesia Preprocedure Evaluation (Addendum)
Anesthesia Evaluation  Patient identified by MRN, date of birth, ID band Patient awake    Reviewed: Allergy & Precautions, H&P , Patient's Chart, lab work & pertinent test results, reviewed documented beta blocker date and time   Airway Mallampati: II  TM Distance: >3 FB Neck ROM: full    Dental no notable dental hx. (+) Caps, Dental Advisory Given   Pulmonary former smoker,    Pulmonary exam normal breath sounds clear to auscultation       Cardiovascular hypertension,  Rhythm:regular Rate:Normal     Neuro/Psych    GI/Hepatic   Endo/Other  diabetes  Renal/GU      Musculoskeletal   Abdominal   Peds  Hematology   Anesthesia Other Findings Diabetes mellitus without complication .... No insulin since yest am.  BS this am 38....  Rx'd w/Dextrose. Now 124 in DS     Hypertension ....controlled       Asthma .Marland Kitchen... Regular use of inhaler in spring. No flare-ups.  Breathing well today      Reproductive/Obstetrics                          Anesthesia Physical Anesthesia Plan  ASA: II  Anesthesia Plan: General   Post-op Pain Management:    Induction: Intravenous  Airway Management Planned: Oral ETT  Additional Equipment:   Intra-op Plan:   Post-operative Plan: Extubation in OR  Informed Consent: I have reviewed the patients History and Physical, chart, labs and discussed the procedure including the risks, benefits and alternatives for the proposed anesthesia with the patient or authorized representative who has indicated his/her understanding and acceptance.   Dental Advisory Given and Dental advisory given  Plan Discussed with: CRNA and Surgeon  Anesthesia Plan Comments: (  Discussed general anesthesia, including possible nausea, instrumentation of airway, sore throat,pulmonary aspiration, etc. I asked if the were any outstanding questions, or  concerns before we proceeded. )         Anesthesia Quick Evaluation

## 2015-09-01 MED ORDER — MAGNESIUM CITRATE PO SOLN
1.0000 | Freq: Once | ORAL | Status: DC
  Filled 2015-09-01: qty 296

## 2015-09-01 MED ORDER — FLEET ENEMA 7-19 GM/118ML RE ENEM
1.0000 | ENEMA | Freq: Once | RECTAL | Status: DC
  Filled 2015-09-01: qty 1

## 2015-09-02 ENCOUNTER — Inpatient Hospital Stay (HOSPITAL_COMMUNITY): Payer: BC Managed Care – PPO | Admitting: Anesthesiology

## 2015-09-02 ENCOUNTER — Inpatient Hospital Stay (HOSPITAL_COMMUNITY)
Admission: AD | Admit: 2015-09-02 | Discharge: 2015-09-03 | DRG: 708 | Disposition: A | Discharge status: Home / Self Care | Payer: BC Managed Care – PPO | Source: Ambulatory Visit | Attending: Urology | Admitting: Urology

## 2015-09-02 ENCOUNTER — Encounter (HOSPITAL_COMMUNITY): Payer: Self-pay

## 2015-09-02 ENCOUNTER — Encounter (HOSPITAL_COMMUNITY)
Admission: AD | Disposition: A | Discharge status: Home / Self Care | Payer: Self-pay | Source: Ambulatory Visit | Attending: Urology

## 2015-09-02 DIAGNOSIS — Z79899 Other long term (current) drug therapy: Secondary | ICD-10-CM

## 2015-09-02 DIAGNOSIS — E119 Type 2 diabetes mellitus without complications: Secondary | ICD-10-CM | POA: Diagnosis present

## 2015-09-02 DIAGNOSIS — Z87891 Personal history of nicotine dependence: Secondary | ICD-10-CM | POA: Diagnosis not present

## 2015-09-02 DIAGNOSIS — I10 Essential (primary) hypertension: Secondary | ICD-10-CM | POA: Diagnosis present

## 2015-09-02 DIAGNOSIS — Z794 Long term (current) use of insulin: Secondary | ICD-10-CM | POA: Diagnosis not present

## 2015-09-02 DIAGNOSIS — C61 Malignant neoplasm of prostate: Secondary | ICD-10-CM | POA: Diagnosis present

## 2015-09-02 HISTORY — PX: LYMPHADENECTOMY: SHX5960

## 2015-09-02 HISTORY — PX: ROBOT ASSISTED LAPAROSCOPIC RADICAL PROSTATECTOMY: SHX5141

## 2015-09-02 LAB — GLUCOSE, CAPILLARY
GLUCOSE-CAPILLARY: 121 mg/dL — AB (ref 65–99)
GLUCOSE-CAPILLARY: 36 mg/dL — AB (ref 65–99)
GLUCOSE-CAPILLARY: 124 mg/dL — AB (ref 65–99)
GLUCOSE-CAPILLARY: 119 mg/dL — AB (ref 65–99)
GLUCOSE-CAPILLARY: 158 mg/dL — AB (ref 65–99)
Glucose-Capillary: 189 mg/dL — ABNORMAL HIGH (ref 65–99)
Glucose-Capillary: 147 mg/dL — ABNORMAL HIGH (ref 65–99)
Glucose-Capillary: 285 mg/dL — ABNORMAL HIGH (ref 65–99)

## 2015-09-02 LAB — HEMOGLOBIN AND HEMATOCRIT, BLOOD
HEMATOCRIT: 39.9 % (ref 39.0–52.0)
Hemoglobin: 14.4 g/dL (ref 13.0–17.0)

## 2015-09-02 SURGERY — XI ROBOTIC ASSISTED LAPAROSCOPIC RADICAL PROSTATECTOMY LEVEL 2
Anesthesia: General

## 2015-09-02 MED ORDER — HYDROMORPHONE HCL 1 MG/ML IJ SOLN
0.2500 mg | INTRAMUSCULAR | Status: DC | PRN
  Administered 2015-09-02 (×4): 0.5 mg via INTRAVENOUS

## 2015-09-02 MED ORDER — ROCURONIUM BROMIDE 100 MG/10ML IV SOLN
INTRAVENOUS | Status: DC | PRN
Start: 2015-09-02 — End: 2015-09-02
  Administered 2015-09-02: 20 mg via INTRAVENOUS
  Administered 2015-09-02: 60 mg via INTRAVENOUS
  Administered 2015-09-02 (×2): 20 mg via INTRAVENOUS

## 2015-09-02 MED ORDER — CEFAZOLIN SODIUM-DEXTROSE 2-3 GM-% IV SOLR
INTRAVENOUS | Status: AC
  Filled 2015-09-02: qty 50

## 2015-09-02 MED ORDER — HYDROMORPHONE HCL 1 MG/ML IJ SOLN
INTRAMUSCULAR | Status: AC
  Filled 2015-09-02: qty 1

## 2015-09-02 MED ORDER — DEXTROSE 50 % IV SOLN
12.5000 g | INTRAVENOUS | Status: DC | PRN
  Administered 2015-09-02: 12.5 g via INTRAVENOUS

## 2015-09-02 MED ORDER — LIDOCAINE HCL 1 % IJ SOLN
INTRAMUSCULAR | Status: AC
  Filled 2015-09-02: qty 20

## 2015-09-02 MED ORDER — FENTANYL CITRATE (PF) 250 MCG/5ML IJ SOLN
INTRAMUSCULAR | Status: AC
  Filled 2015-09-02: qty 5

## 2015-09-02 MED ORDER — PROPOFOL 10 MG/ML IV BOLUS
INTRAVENOUS | Status: DC | PRN
  Administered 2015-09-02: 200 mg via INTRAVENOUS
  Administered 2015-09-02: 50 mg via INTRAVENOUS

## 2015-09-02 MED ORDER — SUGAMMADEX SODIUM 200 MG/2ML IV SOLN
INTRAVENOUS | Status: AC
  Filled 2015-09-02: qty 2

## 2015-09-02 MED ORDER — LIDOCAINE HCL (CARDIAC) 20 MG/ML IV SOLN
INTRAVENOUS | Status: DC | PRN
  Administered 2015-09-02: 80 mg via INTRAVENOUS

## 2015-09-02 MED ORDER — INSULIN ASPART 100 UNIT/ML ~~LOC~~ SOLN
0.0000 [IU] | SUBCUTANEOUS | Status: DC
  Administered 2015-09-02: 2 [IU] via SUBCUTANEOUS
  Administered 2015-09-02: 3 [IU] via SUBCUTANEOUS
  Administered 2015-09-02: 8 [IU] via SUBCUTANEOUS
  Administered 2015-09-03 (×2): 5 [IU] via SUBCUTANEOUS
  Administered 2015-09-03: 2 [IU] via SUBCUTANEOUS

## 2015-09-02 MED ORDER — DEXTROSE 50 % IV SOLN
INTRAVENOUS | Status: AC
  Filled 2015-09-02: qty 50

## 2015-09-02 MED ORDER — FENTANYL CITRATE (PF) 100 MCG/2ML IJ SOLN
INTRAMUSCULAR | Status: DC | PRN
  Administered 2015-09-02 (×2): 50 ug via INTRAVENOUS
  Administered 2015-09-02: 100 ug via INTRAVENOUS
  Administered 2015-09-02: 50 ug via INTRAVENOUS

## 2015-09-02 MED ORDER — ROCURONIUM BROMIDE 100 MG/10ML IV SOLN
INTRAVENOUS | Status: AC
  Filled 2015-09-02: qty 1

## 2015-09-02 MED ORDER — KETOROLAC TROMETHAMINE 30 MG/ML IJ SOLN
30.0000 mg | Freq: Once | INTRAMUSCULAR | Status: AC
  Administered 2015-09-02: 30 mg via INTRAVENOUS

## 2015-09-02 MED ORDER — MIDAZOLAM HCL 2 MG/2ML IJ SOLN
INTRAMUSCULAR | Status: AC
  Filled 2015-09-02: qty 2

## 2015-09-02 MED ORDER — ONDANSETRON HCL 4 MG/2ML IJ SOLN
INTRAMUSCULAR | Status: AC
  Filled 2015-09-02: qty 4

## 2015-09-02 MED ORDER — PHENYLEPHRINE 40 MCG/ML (10ML) SYRINGE FOR IV PUSH (FOR BLOOD PRESSURE SUPPORT)
PREFILLED_SYRINGE | INTRAVENOUS | Status: AC
  Filled 2015-09-02: qty 10

## 2015-09-02 MED ORDER — PROPOFOL 10 MG/ML IV BOLUS
INTRAVENOUS | Status: AC
  Filled 2015-09-02: qty 20

## 2015-09-02 MED ORDER — POTASSIUM CHLORIDE IN NACL 20-0.45 MEQ/L-% IV SOLN
INTRAVENOUS | Status: DC
  Administered 2015-09-02 – 2015-09-03 (×2): via INTRAVENOUS
  Filled 2015-09-02 (×5): qty 1000

## 2015-09-02 MED ORDER — DIPHENHYDRAMINE HCL 50 MG/ML IJ SOLN: 12.5000 mg | Freq: Four times a day (QID) | INTRAMUSCULAR | Status: DC | PRN

## 2015-09-02 MED ORDER — PHENYLEPHRINE HCL 10 MG/ML IJ SOLN
INTRAMUSCULAR | Status: DC | PRN
  Administered 2015-09-02 (×2): 40 ug via INTRAVENOUS
  Administered 2015-09-02 (×3): 80 ug via INTRAVENOUS

## 2015-09-02 MED ORDER — HYDROCODONE-ACETAMINOPHEN 5-325 MG PO TABS: 1.0000 | ORAL_TABLET | Freq: Four times a day (QID) | ORAL | Status: DC | PRN

## 2015-09-02 MED ORDER — LACTATED RINGERS IV SOLN
INTRAVENOUS | Status: DC | PRN
Start: 2015-09-02 — End: 2015-09-02
  Administered 2015-09-02 (×2): via INTRAVENOUS

## 2015-09-02 MED ORDER — MIDAZOLAM HCL 5 MG/5ML IJ SOLN
INTRAMUSCULAR | Status: DC | PRN
  Administered 2015-09-02: 2 mg via INTRAVENOUS

## 2015-09-02 MED ORDER — SODIUM CHLORIDE 0.9 % IV BOLUS (SEPSIS)
1000.0000 mL | Freq: Once | INTRAVENOUS | Status: AC
  Administered 2015-09-02: 1000 mL via INTRAVENOUS

## 2015-09-02 MED ORDER — SUGAMMADEX SODIUM 200 MG/2ML IV SOLN
INTRAVENOUS | Status: DC | PRN
  Administered 2015-09-02: 200 mg via INTRAVENOUS

## 2015-09-02 MED ORDER — DEXAMETHASONE SODIUM PHOSPHATE 10 MG/ML IJ SOLN
INTRAMUSCULAR | Status: AC
  Filled 2015-09-02: qty 1

## 2015-09-02 MED ORDER — DEXAMETHASONE SODIUM PHOSPHATE 10 MG/ML IJ SOLN
INTRAMUSCULAR | Status: DC | PRN
  Administered 2015-09-02: 5 mg via INTRAVENOUS

## 2015-09-02 MED ORDER — DEXTROSE 5 % IV SOLN
INTRAVENOUS | Status: DC
  Administered 2015-09-02: 06:00:00 via INTRAVENOUS

## 2015-09-02 MED ORDER — HEPARIN SODIUM (PORCINE) 1000 UNIT/ML IJ SOLN
INTRAMUSCULAR | Status: AC
  Filled 2015-09-02: qty 1

## 2015-09-02 MED ORDER — BUPIVACAINE-EPINEPHRINE (PF) 0.25% -1:200000 IJ SOLN
INTRAMUSCULAR | Status: AC
  Filled 2015-09-02: qty 30

## 2015-09-02 MED ORDER — GLUCOSE 40 % PO GEL
ORAL | Status: AC
  Filled 2015-09-02: qty 1

## 2015-09-02 MED ORDER — CEFAZOLIN SODIUM-DEXTROSE 2-3 GM-% IV SOLR
2.0000 g | INTRAVENOUS | Status: AC
  Administered 2015-09-02: 2 g via INTRAVENOUS

## 2015-09-02 MED ORDER — KETOROLAC TROMETHAMINE 30 MG/ML IJ SOLN
INTRAMUSCULAR | Status: AC
  Filled 2015-09-02: qty 1

## 2015-09-02 MED ORDER — ALBUTEROL SULFATE (2.5 MG/3ML) 0.083% IN NEBU: 3.0000 mL | INHALATION_SOLUTION | Freq: Four times a day (QID) | RESPIRATORY_TRACT | Status: DC | PRN

## 2015-09-02 MED ORDER — SODIUM CHLORIDE 0.9 % IR SOLN
Status: DC | PRN
  Administered 2015-09-02: 250 mL

## 2015-09-02 MED ORDER — LACTATED RINGERS IV SOLN
INTRAVENOUS | Status: DC | PRN
  Administered 2015-09-02: 10:00:00

## 2015-09-02 MED ORDER — MORPHINE SULFATE (PF) 2 MG/ML IV SOLN
2.0000 mg | INTRAVENOUS | Status: DC | PRN
  Administered 2015-09-02 (×2): 4 mg via INTRAVENOUS
  Filled 2015-09-02 (×2): qty 2

## 2015-09-02 MED ORDER — ONDANSETRON HCL 4 MG/2ML IJ SOLN
INTRAMUSCULAR | Status: DC | PRN
  Administered 2015-09-02 (×4): 2 mg via INTRAVENOUS

## 2015-09-02 MED ORDER — ACETAMINOPHEN 325 MG PO TABS: 650.0000 mg | ORAL_TABLET | ORAL | Status: DC | PRN

## 2015-09-02 MED ORDER — DOCUSATE SODIUM 100 MG PO CAPS
100.0000 mg | ORAL_CAPSULE | Freq: Two times a day (BID) | ORAL | Status: DC
  Administered 2015-09-02 – 2015-09-03 (×3): 100 mg via ORAL
  Filled 2015-09-02 (×4): qty 1

## 2015-09-02 MED ORDER — BUPIVACAINE-EPINEPHRINE (PF) 0.5% -1:200000 IJ SOLN
INTRAMUSCULAR | Status: AC
  Filled 2015-09-02: qty 30

## 2015-09-02 MED ORDER — LIDOCAINE HCL (CARDIAC) 20 MG/ML IV SOLN
INTRAVENOUS | Status: AC
  Filled 2015-09-02: qty 5

## 2015-09-02 MED ORDER — SULFAMETHOXAZOLE-TRIMETHOPRIM 800-160 MG PO TABS: 1.0000 | ORAL_TABLET | Freq: Two times a day (BID) | ORAL | Status: DC

## 2015-09-02 MED ORDER — CEFAZOLIN SODIUM 1-5 GM-% IV SOLN
1.0000 g | Freq: Three times a day (TID) | INTRAVENOUS | Status: AC
  Administered 2015-09-02 (×2): 1 g via INTRAVENOUS
  Filled 2015-09-02 (×2): qty 50

## 2015-09-02 MED ORDER — KETOROLAC TROMETHAMINE 15 MG/ML IJ SOLN
15.0000 mg | Freq: Four times a day (QID) | INTRAMUSCULAR | Status: DC
  Administered 2015-09-02 – 2015-09-03 (×4): 15 mg via INTRAVENOUS
  Filled 2015-09-02 (×5): qty 1

## 2015-09-02 MED ORDER — ACETAMINOPHEN 160 MG/5ML PO SOLN
975.0000 mg | Freq: Once | ORAL | Status: AC
  Administered 2015-09-02: 975 mg via ORAL
  Filled 2015-09-02: qty 40.6

## 2015-09-02 MED ORDER — BUPIVACAINE-EPINEPHRINE 0.25% -1:200000 IJ SOLN
INTRAMUSCULAR | Status: DC | PRN
  Administered 2015-09-02: 30 mL

## 2015-09-02 MED ORDER — DIPHENHYDRAMINE HCL 12.5 MG/5ML PO ELIX: 12.5000 mg | ORAL_SOLUTION | Freq: Four times a day (QID) | ORAL | Status: DC | PRN

## 2015-09-02 SURGICAL SUPPLY — 52 items
APPLICATOR COTTON TIP 6IN STRL (MISCELLANEOUS) ×4 IMPLANT
CATH FOLEY 2WAY SLVR 18FR 30CC (CATHETERS) ×4 IMPLANT
CATH ROBINSON RED A/P 16FR (CATHETERS) ×4 IMPLANT
CATH ROBINSON RED A/P 8FR (CATHETERS) ×4 IMPLANT
CATH TIEMANN FOLEY 18FR 5CC (CATHETERS) ×4 IMPLANT
CHLORAPREP W/TINT 26ML (MISCELLANEOUS) ×4 IMPLANT
CLIP LIGATING HEM O LOK PURPLE (MISCELLANEOUS) ×8 IMPLANT
COVER SURGICAL LIGHT HANDLE (MISCELLANEOUS) ×4 IMPLANT
COVER TIP SHEARS 8 DVNC (MISCELLANEOUS) ×2 IMPLANT
COVER TIP SHEARS 8MM DA VINCI (MISCELLANEOUS) ×2
CUTTER ECHEON FLEX ENDO 45 340 (ENDOMECHANICALS) ×4 IMPLANT
DECANTER SPIKE VIAL GLASS SM (MISCELLANEOUS) ×4 IMPLANT
DRAPE ARM DVNC X/XI (DISPOSABLE) ×8 IMPLANT
DRAPE COLUMN DVNC XI (DISPOSABLE) ×2 IMPLANT
DRAPE DA VINCI XI ARM (DISPOSABLE) ×8
DRAPE DA VINCI XI COLUMN (DISPOSABLE) ×2
DRAPE SURG IRRIG POUCH 19X23 (DRAPES) ×4 IMPLANT
DRSG TEGADERM 4X4.75 (GAUZE/BANDAGES/DRESSINGS) ×4 IMPLANT
DRSG TEGADERM 6X8 (GAUZE/BANDAGES/DRESSINGS) ×2 IMPLANT
ELECT REM PT RETURN 9FT ADLT (ELECTROSURGICAL) ×4
ELECTRODE REM PT RTRN 9FT ADLT (ELECTROSURGICAL) ×2 IMPLANT
GLOVE BIO SURGEON STRL SZ 6.5 (GLOVE) ×4 IMPLANT
GLOVE BIO SURGEONS STRL SZ 6.5 (GLOVE) ×2
GLOVE BIOGEL M STRL SZ7.5 (GLOVE) ×8 IMPLANT
GOWN STRL REUS W/TWL LRG LVL3 (GOWN DISPOSABLE) ×12 IMPLANT
HOLDER FOLEY CATH W/STRAP (MISCELLANEOUS) ×4 IMPLANT
IV LACTATED RINGERS 1000ML (IV SOLUTION) ×4 IMPLANT
LIQUID BAND (GAUZE/BANDAGES/DRESSINGS) ×2 IMPLANT
NDL SAFETY ECLIPSE 18X1.5 (NEEDLE) ×2 IMPLANT
NEEDLE HYPO 18GX1.5 SHARP (NEEDLE) ×4
PACK ROBOT UROLOGY CUSTOM (CUSTOM PROCEDURE TRAY) ×4 IMPLANT
RELOAD GREEN ECHELON 45 (STAPLE) ×4 IMPLANT
SEAL CANN UNIV 5-8 DVNC XI (MISCELLANEOUS) ×8 IMPLANT
SEAL XI 5MM-8MM UNIVERSAL (MISCELLANEOUS) ×8
SET TUBE IRRIG SUCTION NO TIP (IRRIGATION / IRRIGATOR) ×4 IMPLANT
SOLUTION ELECTROLUBE (MISCELLANEOUS) ×4 IMPLANT
SUT ETHILON 3 0 PS 1 (SUTURE) ×4 IMPLANT
SUT MNCRL 3 0 RB1 (SUTURE) ×2 IMPLANT
SUT MNCRL 3 0 VIOLET RB1 (SUTURE) ×2 IMPLANT
SUT MNCRL AB 4-0 PS2 18 (SUTURE) ×8 IMPLANT
SUT MONOCRYL 3 0 RB1 (SUTURE) ×4
SUT VIC AB 0 CT1 27 (SUTURE) ×4
SUT VIC AB 0 CT1 27XBRD ANTBC (SUTURE) ×2 IMPLANT
SUT VIC AB 0 UR5 27 (SUTURE) ×4 IMPLANT
SUT VIC AB 2-0 SH 27 (SUTURE) ×4
SUT VIC AB 2-0 SH 27X BRD (SUTURE) ×2 IMPLANT
SUT VICRYL 0 UR6 27IN ABS (SUTURE) ×8 IMPLANT
SYR 27GX1/2 1ML LL SAFETY (SYRINGE) ×4 IMPLANT
TOWEL OR 17X26 10 PK STRL BLUE (TOWEL DISPOSABLE) ×4 IMPLANT
TOWEL OR NON WOVEN STRL DISP B (DISPOSABLE) ×4 IMPLANT
TUBING INSUFFLATION 10FT LAP (TUBING) IMPLANT
WATER STERILE IRR 1500ML POUR (IV SOLUTION) ×6 IMPLANT

## 2015-09-02 NOTE — Progress Notes (Addendum)
Post-op note  Subjective: The patient is doing well.  No complaints.  Denies N/V.  Objective: Vital signs in last 24 hours: Temp:  [96.8 F (36 C)-98.1 F (36.7 C)] 97.5 F (36.4 C) (03/20 1358) Pulse Rate:  [85-98] 96 (03/20 1358) Resp:  [9-18] 18 (03/20 1358) BP: (144-159)/(77-87) 154/79 mmHg (03/20 1358) SpO2:  [95 %-100 %] 100 % (03/20 1358) Weight:  [92.222 kg (203 lb 5 oz)-92.8 kg (204 lb 9.4 oz)] 92.8 kg (204 lb 9.4 oz) (03/20 1204)  Intake/Output from previous day:   Intake/Output this shift: Total I/O In: 2500 [I.V.:1500; IV Piggyback:1000] Out: 225 [Urine:50; Drains:50; Blood:125]  Physical Exam:  General: Alert and oriented. Abdomen: Soft, Nondistended. Incisions: Clean and dry. Urine: amber  Lab Results:  Recent Labs  09/02/15 1039  HGB 14.4  HCT 39.9    Assessment/Plan: POD#0   1) Continue to monitor  2) DVT prophy, clears, IS, amb, pain control   LOS: 0 days   [name] 09/02/2015, 2:39 PM

## 2015-09-02 NOTE — Anesthesia Procedure Notes (Signed)
Procedure Name: Intubation Date/Time: 09/02/2015 7:58 AM Performed by: Freddie Breech Pre-anesthesia Checklist: Patient identified, Emergency Drugs available, Suction available, Patient being monitored and Timeout performed Patient Re-evaluated:Patient Re-evaluated prior to inductionOxygen Delivery Method: Circle system utilized Preoxygenation: Pre-oxygenation with 100% oxygen Intubation Type: IV induction Ventilation: Mask ventilation without difficulty and Oral airway inserted - appropriate to patient size Laryngoscope Size: Mac and 4 Grade View: Grade II Tube size: 7.5 mm Number of attempts: 1 Airway Equipment and Method: Patient positioned with wedge pillow and Stylet Placement Confirmation: ETT inserted through vocal cords under direct vision,  positive ETCO2,  CO2 detector and breath sounds checked- equal and bilateral Secured at: 22 cm Tube secured with: Tape Dental Injury: Teeth and Oropharynx as per pre-operative assessment

## 2015-09-02 NOTE — Progress Notes (Signed)
Hypoglycemic Event  CBG: 36  Treatment: D50 IV 25 mL  Symptoms: Shaky and Nervous/irritable  Follow-up CBG: [id number] CBG Result:121  Possible Reasons for Event: Inadequate meal intake and Other: NPO for surgery   Comments/MD notified:DR Joslin Made aware    Tennis Ship

## 2015-09-02 NOTE — Discharge Instructions (Signed)

## 2015-09-02 NOTE — Transfer of Care (Signed)
Immediate Anesthesia Transfer of Care Note  Patient: [name]  Procedure(s) Performed: Procedure(s): XI ROBOTIC ASSISTED LAPAROSCOPIC RADICAL PROSTATECTOMY LEVEL 2 (N/A) BILATERAL LYMPHADENECTOMY (Bilateral)  Patient Location: PACU  Anesthesia Type:General  Level of Consciousness:  sedated, patient cooperative and responds to stimulation  Airway & Oxygen Therapy:Patient Spontanous Breathing and Patient connected to face mask oxgen  Post-op Assessment:  Report given to PACU RN and Post -op Vital signs reviewed and stable  Post vital signs:  Reviewed and stable  Last Vitals:  Filed Vitals:   09/02/15 0541 09/02/15 1026  BP: 152/79   Pulse: 98   Temp: 36.7 C 36 C  Resp: 18     Complications: No apparent anesthesia complications

## 2015-09-02 NOTE — Op Note (Signed)
Preoperative diagnosis: Clinically localized adenocarcinoma of the prostate (clinical stage T2a N0 M0)  Postoperative diagnosis: Clinically localized adenocarcinoma of the prostate (clinical stage T2a N0 M0)  Procedure:  1. Robotic assisted laparoscopic radical prostatectomy (left nerve sparing) 2. Bilateral robotic assisted laparoscopic pelvic lymphadenectomy  Surgeon: Pryor Curia. M.D.  Assistant(s): Debbrah Alar, PA-C  Resident: Dr. Jearld Adjutant  Anesthesia: General  Complications: None  EBL: 125 mL  IVF:  1700 mL crystalloid  Specimens: 1. Prostate and seminal vesicles 2. Right pelvic lymph nodes 3. Left pelvic lymph nodes  Disposition of specimens: Pathology  Drains: 1. 20 Fr coude catheter 2. # 19 Blake pelvic drain  Indication: [name] is a 50 y.o. patient with clinically localized prostate cancer.  After a thorough review of the management options for treatment of prostate cancer, he elected to proceed with surgical therapy and the above procedure(s).  We have discussed the potential benefits and risks of the procedure, side effects of the proposed treatment, the likelihood of the patient achieving the goals of the procedure, and any potential problems that might occur during the procedure or recuperation. Informed consent has been obtained.  Description of procedure:  The patient was taken to the operating room and a general anesthetic was administered. He was given preoperative antibiotics, placed in the dorsal lithotomy position, and prepped and draped in the usual sterile fashion. Next a preoperative timeout was performed. A urethral catheter was placed into the bladder and a site was selected near the umbilicus for placement of the camera port. This was placed using a standard open Hassan technique which allowed entry into the peritoneal cavity under direct vision and without difficulty. An 8 mm port was placed and a pneumoperitoneum established. The  camera was then used to inspect the abdomen and there was no evidence of any intra-abdominal injuries or other abnormalities. The remaining abdominal ports were then placed. 8 mm robotic ports were placed in the right lower quadrant, left lower quadrant, and far left lateral abdominal wall. A 5 mm port was placed in the right upper quadrant and a 12 mm port was placed in the right lateral abdominal wall for laparoscopic assistance. All ports were placed under direct vision without difficulty. The surgical cart was then docked.   Utilizing the cautery scissors, the bladder was reflected posteriorly allowing entry into the space of Retzius and identification of the endopelvic fascia and prostate. The periprostatic fat was then removed from the prostate allowing full exposure of the endopelvic fascia. The endopelvic fascia was then incised from the apex back to the base of the prostate bilaterally and the underlying levator muscle fibers were swept laterally off the prostate thereby isolating the dorsal venous complex. The dorsal vein was then stapled and divided with a 45 mm Flex Echelon stapler. Attention then turned to the bladder neck which was divided anteriorly thereby allowing entry into the bladder and exposure of the urethral catheter. The catheter balloon was deflated and the catheter was brought into the operative field and used to retract the prostate anteriorly. The posterior bladder neck was then examined and was divided allowing further dissection between the bladder and prostate posteriorly until the vasa deferentia and seminal vessels were identified. The vasa deferentia were isolated, divided, and lifted anteriorly. The seminal vesicles were dissected down to their tips with care to control the seminal vascular arterial blood supply. These structures were then lifted anteriorly and the space between Denonvillier's fascia and the anterior rectum was developed  with a combination of sharp and blunt  dissection. This isolated the vascular pedicles of the prostate.  The lateral prostatic fascia on the left side of the prostate was then sharply incised allowing release of the neurovascular bundle. The vascular pedicle of the prostate on the left side was then ligated with Weck clips between the prostate and neurovascular bundle and divided with sharp cold scissor dissection resulting in neurovascular bundle preservation. On the right side, a wide non nerve sparing dissection was performed with Weck clips used to ligate the vascular pedicle of the prostate. The neurovascular bundle on the left side was then separated off the apex of the prostate and urethra.  The urethra was then sharply transected allowing the prostate specimen to be disarticulated. The pelvis was copiously irrigated and hemostasis was ensured. There was no evidence for rectal injury.  Attention then turned to the right pelvic sidewall. The fibrofatty tissue between the external iliac vein, confluence of the iliac vessels, hypogastric artery, and Cooper's ligament was dissected free from the pelvic sidewall with care to preserve the obturator nerve. Weck clips were used for lymphostasis and hemostasis. An identical procedure was performed on the contralateral side and the lymphatic packets were removed for permanent pathologic analysis.  Attention then turned to the urethral anastomosis. A 2-0 Vicryl slip knot was placed between Denonvillier's fascia, the posterior bladder neck, and the posterior urethra to reapproximate these structures. A double-armed 3-0 Monocryl suture was then used to perform a 360 running tension-free anastomosis between the bladder neck and urethra. A new urethral catheter was then placed into the bladder and irrigated. There were no blood clots within the bladder and the anastomosis appeared to be watertight. A #19 Blake drain was then brought through the left lateral 8 mm port site and positioned appropriately  within the pelvis. It was secured to the skin with a nylon suture. The surgical cart was then undocked. The right lateral 12 mm port site was closed at the fascial level with a 0 Vicryl suture placed laparoscopically. All remaining ports were then removed under direct vision. The prostate specimen was removed intact within the Endopouch retrieval bag via the periumbilical camera port site. This fascial opening was closed with two running 0 Vicryl sutures. 0.25% Marcaine was then injected into all port sites and all incisions were reapproximated at the skin level with 4-0 Monocryl subcuticular sutures and Dermabond. The patient appeared to tolerate the procedure well and without complications. The patient was able to be extubated and transferred to the recovery unit in satisfactory condition.   Pryor Curia MD

## 2015-09-03 LAB — GLUCOSE, CAPILLARY
GLUCOSE-CAPILLARY: 232 mg/dL — AB (ref 65–99)
GLUCOSE-CAPILLARY: 79 mg/dL (ref 65–99)
Glucose-Capillary: 127 mg/dL — ABNORMAL HIGH (ref 65–99)
Glucose-Capillary: 213 mg/dL — ABNORMAL HIGH (ref 65–99)

## 2015-09-03 LAB — HEMOGLOBIN AND HEMATOCRIT, BLOOD
HEMATOCRIT: 37.6 % — AB (ref 39.0–52.0)
HEMOGLOBIN: 12.9 g/dL — AB (ref 13.0–17.0)

## 2015-09-03 MED ORDER — BISACODYL 10 MG RE SUPP
10.0000 mg | Freq: Once | RECTAL | Status: AC
  Administered 2015-09-03: 10 mg via RECTAL
  Filled 2015-09-03: qty 1

## 2015-09-03 MED ORDER — HYDROCODONE-ACETAMINOPHEN 5-325 MG PO TABS
1.0000 | ORAL_TABLET | Freq: Four times a day (QID) | ORAL | Status: DC | PRN
  Administered 2015-09-03: 1 via ORAL
  Filled 2015-09-03: qty 1

## 2015-09-03 NOTE — Progress Notes (Signed)
Patient ID: [name], male   DOB: [birth year], 50 y.o.   MRN: [id number]  1 Day Post-Op Subjective: The patient is doing well.  No nausea or vomiting. Pain is adequately controlled.  Objective: Vital signs in last 24 hours: Temp:  [96.8 F (36 C)-99.2 F (37.3 C)] 98.6 F (37 C) (03/21 0446) Pulse Rate:  [85-107] 95 (03/21 0446) Resp:  [9-18] 18 (03/21 0446) BP: (131-159)/(58-87) 131/64 mmHg (03/21 0446) SpO2:  [95 %-100 %] 98 % (03/21 0446) Weight:  [92.8 kg (204 lb 9.4 oz)] 92.8 kg (204 lb 9.4 oz) (03/20 1204)  Intake/Output from previous day: 03/20 0701 - 03/21 0700 In: 4865 [I.V.:3815; IV Piggyback:1050] Out: [id number] [Urine:975; Drains:165; Blood:125] Intake/Output this shift:    Physical Exam:  General: Alert and oriented. CV: RRR Lungs: Clear bilaterally. GI: Soft, Nondistended. Incisions: Clean, dry, and intact Urine: Clear Extremities: Nontender, no erythema, no edema.  Lab Results:  Recent Labs  09/02/15 1039 09/03/15 0554  HGB 14.4 12.9*  HCT 39.9 37.6*      Assessment/Plan: POD# 1 s/p robotic prostatectomy.  1) SL IVF 2) Ambulate, Incentive spirometry 3) Transition to oral pain medication 4) Dulcolax suppository 5) D/C pelvic drain 6) Plan for likely discharge later today   Pryor Curia. MD   LOS: 1 day   Gustav Knueppel,LES 09/03/2015, 7:58 AM

## 2015-09-03 NOTE — Discharge Summary (Signed)
  Date of admission: 09/02/2015  Date of discharge: 09/03/2015  Admission diagnosis: Prostate Cancer  Discharge diagnosis: Prostate Cancer  History and Physical: For full details, please see admission history and physical. Briefly, [name] is a 50 y.o. gentleman with localized prostate cancer.  After discussing management/treatment options, he elected to proceed with surgical treatment.  Hospital Course: [name] was taken to the operating room on 09/02/2015 and underwent a robotic assisted laparoscopic radical prostatectomy. He tolerated this procedure well and without complications. Postoperatively, he was able to be transferred to a regular hospital room following recovery from anesthesia.  He was able to begin ambulating the night of surgery. He remained hemodynamically stable overnight.  He had excellent urine output with appropriately minimal output from his pelvic drain and his pelvic drain was removed on POD #1.  He was transitioned to oral pain medication, tolerated a clear liquid diet, and had met all discharge criteria and was able to be discharged home later on POD#1.  Laboratory values:  Recent Labs  09/02/15 1039 09/03/15 0554  HGB 14.4 12.9*  HCT 39.9 37.6*    Disposition: Home  Discharge instruction: He was instructed to be ambulatory but to refrain from heavy lifting, strenuous activity, or driving. He was instructed on urethral catheter care.  Discharge medications:     Medication List    STOP taking these medications        multivitamin with minerals Tabs tablet      TAKE these medications        HYDROcodone-acetaminophen 5-325 MG tablet  Commonly known as:  NORCO  Take 1-2 tablets by mouth every 6 (six) hours as needed.     NOVOLOG FLEXPEN 100 UNIT/ML FlexPen  Generic drug:  insulin aspart  Inject 4-8 Units into the skin 3 (three) times daily. Sliding Scale     PROAIR HFA 108 (90 Base) MCG/ACT inhaler  Generic drug:  albuterol  Inhale 1 puff  into the lungs every 6 (six) hours as needed for wheezing or shortness of breath.     ramipril 10 MG capsule  Commonly known as:  ALTACE  Take 10 mg by mouth daily.     sulfamethoxazole-trimethoprim 800-160 MG tablet  Commonly known as:  BACTRIM DS,SEPTRA DS  Take 1 tablet by mouth 2 (two) times daily. Start the day prior to foley removal appointment     TRESIBA FLEXTOUCH 200 UNIT/ML Sopn  Generic drug:  Insulin Degludec  Inject 36 Units into the skin every morning.        Followup: He will followup in 1 week for catheter removal and to discuss his surgical pathology results.

## 2015-09-03 NOTE — Care Management Note (Signed)
Case Management Note  Patient Details  Name: [name] MRN: [id number] Date of Birth: 11/02/1965  Subjective/Objective:                    Action/Plan:d/c home no needs or orders.   Expected Discharge Date:                 Expected Discharge Plan:  Home/Self Care  In-House Referral:     Discharge planning Services  CM Consult  Post Acute Care Choice:    Choice offered to:     DME Arranged:    DME Agency:     HH Arranged:    [place] Agency:     Status of Service:  Completed, signed off  Medicare Important Message Given:    Date Medicare IM Given:    Medicare IM give by:    Date Additional Medicare IM Given:    Additional Medicare Important Message give by:     If discussed at [place] of Stay Meetings, dates discussed:    Additional Comments:  Dessa Phi, RN 09/03/2015, 1:16 PM

## 2015-09-03 NOTE — Anesthesia Postprocedure Evaluation (Signed)
Anesthesia Post Note  Patient: [name]  Procedure(s) Performed: Procedure(s) (LRB): XI ROBOTIC ASSISTED LAPAROSCOPIC RADICAL PROSTATECTOMY LEVEL 2 (N/A) BILATERAL LYMPHADENECTOMY (Bilateral)  Patient location during evaluation: PACU Anesthesia Type: General Level of consciousness: sedated Pain management: satisfactory to patient Vital Signs Assessment: post-procedure vital signs reviewed and stable Respiratory status: spontaneous breathing Cardiovascular status: stable Anesthetic complications: no    Last Vitals:  Filed Vitals:   09/03/15 0138 09/03/15 0446  BP: 135/58 131/64  Pulse: 103 95  Temp: 36.9 C 37 C  Resp: 16 18    Last Pain:  Filed Vitals:   09/03/15 0548  PainSc: Asleep                 Nguyen Todorov EDWARD

## 2015-09-03 NOTE — Care Management Note (Signed)
Case Management Note  Patient Details  Name: [name] MRN: [id number] Date of Birth: 1966/04/22  Subjective/Objective:50 y/o m admitted w/Prostate Ca, POD#1 lap rad prostatectomy. From home.                    Action/Plan:d/c plan home.   Expected Discharge Date:                 Expected Discharge Plan:  Home/Self Care  In-House Referral:     Discharge planning Services  CM Consult  Post Acute Care Choice:    Choice offered to:     DME Arranged:    DME Agency:     HH Arranged:    HH Agency:     Status of Service:  In process, will continue to follow  Medicare Important Message Given:    Date Medicare IM Given:    Medicare IM give by:    Date Additional Medicare IM Given:    Additional Medicare Important Message give by:     If discussed at [place] of Stay Meetings, dates discussed:    Additional Comments:  Dessa Phi, RN 09/03/2015, 11:42 AM

## 2017-12-29 DIAGNOSIS — I10 Essential (primary) hypertension: Secondary | ICD-10-CM | POA: Insufficient documentation

## 2018-02-01 ENCOUNTER — Encounter: Payer: Self-pay | Admitting: Radiation Oncology

## 2018-02-01 ENCOUNTER — Other Ambulatory Visit (HOSPITAL_COMMUNITY): Payer: Self-pay | Admitting: Urology

## 2018-02-01 DIAGNOSIS — C61 Malignant neoplasm of prostate: Secondary | ICD-10-CM

## 2018-02-10 ENCOUNTER — Ambulatory Visit (HOSPITAL_COMMUNITY)
Admission: RE | Admit: 2018-02-10 | Discharge: 2018-02-10 | Disposition: A | Discharge status: Home / Self Care | Payer: BC Managed Care – PPO | Source: Ambulatory Visit | Attending: Urology | Admitting: Urology

## 2018-02-10 DIAGNOSIS — C61 Malignant neoplasm of prostate: Secondary | ICD-10-CM | POA: Diagnosis present

## 2018-02-10 DIAGNOSIS — M899 Disorder of bone, unspecified: Secondary | ICD-10-CM | POA: Diagnosis not present

## 2018-02-10 MED ORDER — AXUMIN (FLUCICLOVINE F 18) INJECTION
10.4000 | Freq: Once | INTRAVENOUS | Status: AC
  Administered 2018-02-10: 10.4 via INTRAVENOUS

## 2018-02-18 NOTE — Progress Notes (Signed)
GU Location of Tumor / Histology: Biochemically recurrent prostate cancer  01/28/18 PSA  0.22 12/31/17 PSA  0.22 08/30/17 PSA  0.10 05/18/17 PSA  0.069 10/19/16  PSA  0.040 04/20/16 PSA  0.020 10/24/15 PSA  0.01  If Prostate Cancer, Gleason Score is (4 + 4) and PSA is (6.1) pre treatment.  [name] s/p UNS RAL radical prostatectomy and BPLND on 09/02/2015. Negative surgical margins with 1 of 14 positive lymph nodes.    Past/Anticipated interventions by urology, if any: prostate biopsy, prostatectomy, PET scan (negative), referral to radiation oncology for salvage therapy  Past/Anticipated interventions by medical oncology, if any: no  Weight changes, if any: no  Bowel/Bladder complaints, if any: IPSS 3. SHIM 5. Reports ED. Denies urinary incontinence. Reports his stream is occasionally not as strong as it was prior to his prostatectomy.   Nausea/Vomiting, if any: no  Pain issues, if any:  no  SAFETY ISSUES:  Prior radiation? no  Pacemaker/ICD? no  Possible current pregnancy? no  Is the patient on methotrexate? no  Current Complaints / other details:  52 year old male. Married. Discussed short term ADT with Dr. Alinda Money. No ADT received yet. Patient was adopted. Patient is a Type I diabetic.

## 2018-02-21 ENCOUNTER — Ambulatory Visit
Admission: RE | Admit: 2018-02-21 | Discharge: 2018-02-21 | Disposition: A | Discharge status: Home / Self Care | Payer: BC Managed Care – PPO | Source: Ambulatory Visit | Attending: Radiation Oncology | Admitting: Radiation Oncology

## 2018-02-21 ENCOUNTER — Encounter: Payer: Self-pay | Admitting: Medical Oncology

## 2018-02-21 ENCOUNTER — Other Ambulatory Visit: Payer: Self-pay

## 2018-02-21 ENCOUNTER — Encounter: Payer: Self-pay | Admitting: Radiation Oncology

## 2018-02-21 VITALS — BP 168/94 | HR 101 | Temp 98.6°F | Resp 20 | Ht 70.0 in | Wt 210.2 lb

## 2018-02-21 DIAGNOSIS — E119 Type 2 diabetes mellitus without complications: Secondary | ICD-10-CM | POA: Diagnosis not present

## 2018-02-21 DIAGNOSIS — Z794 Long term (current) use of insulin: Secondary | ICD-10-CM | POA: Diagnosis not present

## 2018-02-21 DIAGNOSIS — C61 Malignant neoplasm of prostate: Secondary | ICD-10-CM

## 2018-02-21 DIAGNOSIS — I10 Essential (primary) hypertension: Secondary | ICD-10-CM | POA: Diagnosis not present

## 2018-02-21 DIAGNOSIS — Z87891 Personal history of nicotine dependence: Secondary | ICD-10-CM | POA: Insufficient documentation

## 2018-02-21 HISTORY — DX: Malignant neoplasm of prostate: C61

## 2018-02-21 NOTE — Progress Notes (Signed)
Radiation Oncology         [phone] ________________________________  Initial outpatient Consultation  Name: [name] MRN: [id number]  Date: 02/21/2018  DOB: 1965-08-02  CC:Sun, Mikeal Hawthorne, MD  Raynelle Bring, MD   REFERRING PHYSICIAN: Raynelle Bring, MD  DIAGNOSIS: 52 y.o. gentleman with biochemical recurrence of prostate cancer following prostatectomy.    ICD-10-CM   1. Malignant neoplasm of prostate ([place]) C61     HISTORY OF PRESENT ILLNESS:[name] is a 52 y.o. gentleman with a history of prostate cancer. He had a pretreamtnet PSA of 6.1, and proceeded with prostatectomy, which was performed on 09/02/15.  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 One of 14 lymph nodes was positive for metastatic disease, with a Gleason score of 4+5=9. He has been followed in surveillance with PSA, and was 0.069 on 05/18/17, 0.10 on 08/30/17, 0.22 on 12/31/17. PET scan performed on 02/10/18 showed no signs of metastatic disease.  The patient reviewed the biopsy results with his urologist and he has kindly been referred today for discussion of potential salvage radiation treatment options.     PREVIOUS RADIATION THERAPY: No  Past Medical History:  Past Medical History:  Diagnosis Date  . Asthma   . Cancer [facility])    prostate cancer   . Diabetes mellitus without complication ([place])   . History of bronchitis   . Hypertension   . Prostate cancer ([place])   . Wears glasses     Past Surgical History: Past Surgical History:  Procedure Laterality Date  . EYE SURGERY     laser eye surgery bilat   . left groin hernia     . LYMPHADENECTOMY Bilateral 09/02/2015   Procedure: BILATERAL LYMPHADENECTOMY;  Surgeon: Raynelle Bring, MD;  Location: [facility];  Service: Urology;  Laterality: Bilateral;  . ROBOT ASSISTED LAPAROSCOPIC RADICAL PROSTATECTOMY N/A  09/02/2015   Procedure: XI ROBOTIC ASSISTED LAPAROSCOPIC RADICAL PROSTATECTOMY LEVEL 2;  Surgeon: Raynelle Bring, MD;  Location: [facility];  Service: Urology;  Laterality: N/A;  . 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 TONSILLECTOMY      Social History:  Social History   Socioeconomic History  . Marital status: Married    Spouse name: Not on file  . Number of children: Not on file  . Years of education: Not on file  . Highest education level: Not on file  Occupational History  . Not on file  Social Needs  . Financial resource strain: Not on file  . Food insecurity:    Worry: Not on file    Inability: Not on file  . Transportation needs:    Medical: Not on file    Non-medical: Not on file  Tobacco Use  . Smoking status: Former Smoker    Packs/day: 0.50    Years: 7.00    Pack years: 3.50    Types: Cigarettes    Last attempt to quit: 06/15/1998    Years since quitting: 19.7  . Smokeless tobacco: Never Used  Substance and Sexual Activity  . Alcohol use: Yes    Comment: beer daily Monday through Friday 5 beers through the weekends  . Drug use: No  . Sexual activity: Not on file  Lifestyle  . Physical activity:    Days per week: Not on file    Minutes per session: Not on file  . Stress:  Not on file  Relationships  . Social connections:    Talks on phone: Not on file    Gets together: Not on file    Attends religious service: Not on file    Active member of club or organization: Not on file    Attends meetings of clubs or organizations: Not on file    Relationship status: Not on file  . Intimate partner violence:    Fear of current or ex partner: Not on file    Emotionally abused: Not on file    Physically abused: Not on file    Forced sexual activity: Not on file  Other Topics Concern  . Not on file  Social History Narrative  . Not on file  The patient is married. He works for a [employer] systems [employer] to [place] reviewed. No pertinent family history. The patient is adopted and does not know his biologic family history.  ALLERGIES: Patient has no known allergies.  MEDICATIONS:  Current Outpatient Medications  Medication Sig Dispense Refill  . amLODipine (NORVASC) 2.5 MG tablet     . insulin lispro (HUMALOG) 100 UNIT/ML injection Inject into the skin.    Marland Kitchen NOVOLOG FLEXPEN 100 UNIT/ML FlexPen Inject 4-8 Units into the skin 3 (three) times daily. Sliding Scale    . PROAIR HFA 108 (90 Base) MCG/ACT inhaler Inhale 1 puff into the lungs every 6 (six) hours as needed for wheezing or shortness of breath.     . ramipril (ALTACE) 10 MG capsule Take 10 mg by mouth daily.    . TRESIBA FLEXTOUCH 200 UNIT/ML SOPN Inject 36 Units into the skin every morning.      No current facility-administered medications for this encounter.     REVIEW OF SYSTEMS:  On review of systems, the patient reports that he is doing well overall. He denies any chest pain, shortness of breath, cough, fevers, chills, night sweats, unintended weight changes. He denies any bowel disturbances, and denies abdominal pain, nausea or vomiting. The patient completed an IPSS and IIEF questionnaire.  His IPSS score was 3 indicating mild urinary outflow obstructive symptoms.  He indicated that his erectile function is limited to complete sexual activity He denies any new musculoskeletal or joint aches or pains, new skin lesions or concerns. A complete review of systems is obtained and is otherwise negative.   PHYSICAL EXAM:  Wt Readings from Last 3 Encounters:  02/21/18 210 lb 3.2 oz (95.3 kg)  09/02/15 204 lb 9.4 oz (92.8 kg)  08/22/15 203 lb 5 oz (92.2 kg)   Temp Readings from Last 3 Encounters:  02/21/18 98.6 F (37 C) (Oral)  09/03/15 98.6 F (37 C) (Oral)  08/22/15 98 F (36.7 C) (Oral)   BP Readings from Last 3 Encounters:  02/21/18 (!) 168/94  09/03/15 131/64  08/22/15 (!) 143/77   Pulse Readings from Last 3  Encounters:  02/21/18 (!) 101  09/03/15 95  08/22/15 (!) 102    In general this is a well appearing caucasian male in no acute distress. He's alert and oriented x4 and appropriate throughout the examination. Cardiopulmonary assessment is negative for acute distress and he exhibits normal effort.   KPS = 100  100 - Normal; no complaints; no evidence of disease. 90   - Able to carry on normal activity; minor signs or symptoms of disease. 80   - Normal activity with effort; some signs or symptoms of disease. 74   - Cares for self;  unable to carry on normal activity or to do active work. 60   - Requires occasional assistance, but is able to care for most of his personal needs. 50   - Requires considerable assistance and frequent medical care. 50   - Disabled; requires special care and assistance. 33   - Severely disabled; hospital admission is indicated although death not imminent. 72   - Very sick; hospital admission necessary; active supportive treatment necessary. 10   - Moribund; fatal processes progressing rapidly. 0     - Dead  Karnofsky DA, Abelmann Stephenson, Craver LS and Burchenal [facility] [phone]) The use of the nitrogen mustards in the palliative treatment of carcinoma: with particular reference to bronchogenic carcinoma Cancer 1 634-56   LABORATORY DATA:  Lab Results  Component Value Date   WBC 4.9 08/22/2015   HGB 12.9 (L) 09/03/2015   HCT 37.6 (L) 09/03/2015   MCV 86.1 08/22/2015   PLT 306 08/22/2015   Lab Results  Component Value Date   NA 138 08/22/2015   K 4.3 08/22/2015   CL 104 08/22/2015   CO2 25 08/22/2015   No results found for: ALT, AST, GGT, ALKPHOS, BILITOT   RADIOGRAPHY: Nm Pet (axumin) Skull Base To Mid Thigh  Result Date: 02/10/2018 CLINICAL DATA:  Prostate carcinoma with biochemical recurrence. PSA equal 0.22 EXAM: NUCLEAR MEDICINE PET SKULL BASE TO THIGH TECHNIQUE: 10.2 mCi F-18 Fluciclovine was injected intravenously. Full-ring PET imaging was performed from  the skull base to thigh after the radiotracer. CT data was obtained and used for attenuation correction and anatomic localization. COMPARISON:  CT 07/22/2015 FINDINGS: NECK No radiotracer activity in neck lymph nodes. Incidental CT finding: None CHEST No radiotracer accumulation within mediastinal or hilar lymph nodes. No suspicious pulmonary nodules on the CT scan. Incidental CT finding: None ABDOMEN/PELVIS Prostate: No focal activity in the prostate bed. Lymph nodes: No abnormal radiotracer accumulation within pelvic or abdominal nodes. Liver: No evidence of liver metastasis Focal activity within the musculature superficial to the RIGHT iliac wing SUV max equal 5.1. Subtle low-density lesion at this level measuring 15 mm (image 164/5.) Incidental CT finding: Calcification of the vas deferens. SKELETON No focal  activity to suggest skeletal metastasis. IMPRESSION: 1. No evidence of local recurrence in the prostate bed or metastatic lymph nodes in the pelvis. 2. No evidence of distant metastatic disease or skeletal metastasis. 3. Focal activity within the musculature superficial to the RIGHT iliac wing would be highly unusual location for solitary prostate cancer metastasis. Favor inflammatory lesion or other benign lesion. Electronically Signed   By: Suzy Bouchard M.D.   On: 02/10/2018 15:52      IMPRESSION/PLAN: 1.   52 y.o. gentleman with a biochemical recurrence of his PSA in the setting of prior prostatectomy.  We met with the patient and his wife and reviewed his case and his recent work-up. Although his PET was negative, the presumption is that his PSA is driven by microscopic local recurrence in the prostatic fossa given the high risk features seen at the time of his prostatectomy. He would be a candidate for androgen deprivation therapy (ADT) as well as radiotherapy to the fossa and pelvic nodes. At the end of the discussion, the patient is interested in moving forward with therapy. I will notify Dr.  Alinda Money so we can get him scheduled for his ADT then coordinate simulation to occur shortly after. We discussed the risks, benefits, short, and long term effects of radiotherapy, and the patient is interested in  proceeding. He will sign consent on the day of his simulation. 2.  Possible genetic predisposition to malignancy. The patient is counseled on the possibilty of his cancer being genetically driven. He was offered referral and will be contacted in the near future by a counselor to determine if he has enough indication to test.  I spent 60 minutes face to face with the patient and more than 50% of that time was spent in counseling and/or coordination of care.      Carola Rhine, PAC    Tyler Pita, MD  [facility] Direct Dial: [phone]  Fax: [phone] [email or web address]  Skype  [email or web address]   This document serves as a record of services personally performed by Tyler Pita, MD and Shona Simpson, PAC. It was created on their behalf by Wilburn Mylar, a trained medical scribe. The creation of this record is based on the scribe's personal observations and the provider's statements to them. This document has been checked and approved by the attending provider.

## 2018-02-21 NOTE — Progress Notes (Signed)
See progress note under physician encounter. 

## 2018-02-23 ENCOUNTER — Encounter: Payer: Self-pay | Admitting: General Practice

## 2018-02-23 NOTE — Progress Notes (Signed)
[place] Psychosocial Distress Screening Clinical Social Work  Clinical Social Work was referred by distress screening protocol.  The patient scored a 8 on the Psychosocial Distress Thermometer which indicates moderate distress. Clinical Social Worker contacted patient by phone to assess for distress and other psychosocial needs. Unable to reach by phone, left generic VM requesting call back if desired.   ONCBCN DISTRESS SCREENING 02/21/2018  Screening Type Initial Screening  Distress experienced in past week (1-10) 8  Family Problem type Partner;Children  Emotional problem type Nervousness/Anxiety;Adjusting to illness  Physician notified of physical symptoms Yes  Referral to clinical psychology No  Referral to clinical social work No  Referral to dietition No  Referral to financial advocate No  Referral to support programs No  Referral to palliative care No    Clinical Social Worker follow up needed: No.  Await call back from patient.  VM left.   If yes, follow up plan:  Beverely Pace, [place], LCSW Clinical Social Worker Phone:  [phone]

## 2018-02-24 ENCOUNTER — Telehealth: Payer: Self-pay | Admitting: General Practice

## 2018-02-24 NOTE — Telephone Encounter (Signed)
[place] Psychosocial Distress Screening Clinical Social Work  Clinical Social Work was referred by distress screening protocol.  The patient scored a 8 on the Psychosocial Distress Thermometer which indicates moderate distress. Clinical Social Worker contacted patient by phone to assess for distress and other psychosocial needs.   Expressed concern about upcoming CT Sim appointment.  Multiple medical concerns in addition to cancer have resulted in frequent MD visits and treatments - is becoming fatigued by multiple issues needing to be addressed.  Family also facing additional stressors including son making college decisions and in laws in failing health resulting in caregiver stress.  Aware that he will be in radiation treatment which will require juggling of work and family responsibilities.  Has adequate support; however, is also facing significant psychosocial stressors.  Nurse navigator alerted and asked to call to clarify medical issues for patient.  Will mail information packet on [place] and resources for coping w stressors related to cancer diagnosis and treatment.    ONCBCN DISTRESS SCREENING 02/21/2018  Screening Type Initial Screening  Distress experienced in past week (1-10) 8  Family Problem type Partner;Children  Emotional problem type Nervousness/Anxiety;Adjusting to illness  Physician notified of physical symptoms Yes  Referral to clinical psychology No  Referral to clinical social work No  Referral to dietition No  Referral to financial advocate No  Referral to support programs No  Referral to palliative care No    Clinical Social Worker follow up needed: No.  If yes, follow up plan:  Beverely Pace, [place], LCSW Clinical Social Worker Phone:  [phone]

## 2018-03-02 ENCOUNTER — Telehealth: Payer: Self-pay | Admitting: Radiation Oncology

## 2018-03-02 NOTE — Progress Notes (Signed)
Phoned Caryl Pina, RN for Dr.Borden at [facility]. Inquired when patient received ADT. Caryl Pina, RN reports calling the patient several times attempting to set up an appointment for him to receive the injection. She states, "one time I called him and he told me he was too busy to talk." She reports calling him 3 more times and leaving him message with no return call. Caryl Pina, RN expressed her intent to continue reaching out to the patient until an appointment is secured.

## 2018-03-15 ENCOUNTER — Telehealth: Payer: Self-pay | Admitting: Medical Oncology

## 2018-03-15 NOTE — Telephone Encounter (Signed)
Left a message requesting a return call as follow up to consult visit with Dr. Tammi Klippel.

## 2018-03-17 ENCOUNTER — Encounter: Payer: Self-pay | Admitting: Medical Oncology

## 2018-03-23 ENCOUNTER — Ambulatory Visit
Admission: RE | Admit: 2018-03-23 | Discharge: 2018-03-23 | Disposition: A | Discharge status: Home / Self Care | Payer: BC Managed Care – PPO | Source: Ambulatory Visit | Attending: Radiation Oncology | Admitting: Radiation Oncology

## 2018-03-23 DIAGNOSIS — C61 Malignant neoplasm of prostate: Secondary | ICD-10-CM | POA: Diagnosis present

## 2018-03-23 DIAGNOSIS — Z510 Encounter for antineoplastic radiation therapy: Secondary | ICD-10-CM | POA: Diagnosis present

## 2018-03-23 NOTE — Progress Notes (Signed)
  Radiation Oncology         [phone] ________________________________  Name: [name] MRN: [id number]  Date: 03/23/2018  DOB: July 01, 1965  SIMULATION AND TREATMENT PLANNING NOTE    ICD-10-CM   1. Prostate cancer ([place]) C61     DIAGNOSIS:  52 y.o. gentleman following prostatectomy with extraprostatic extension identified at the right posterolateral at the apex, mid, and base, with lymphovascular invasion, and with negative margins with one of 14 lymph nodes positive for metastatic disease, with a Gleason score of 4+5=9 with detectable rising PSA of 0.22   NARRATIVE:  The patient was brought to the [place].  Identity was confirmed.  All relevant records and images related to the planned course of therapy were reviewed.  The patient freely provided informed written consent to proceed with treatment after reviewing the details related to the planned course of therapy. The consent form was witnessed and verified by the simulation staff.  Then, the patient was set-up in a stable reproducible supine position for radiation therapy.  A vacuum lock pillow device was custom fabricated to position his legs in a reproducible immobilized position.  Then, I performed a urethrogram under sterile conditions to identify the prostatic bed.  CT images were obtained.  Surface markings were placed.  The CT images were loaded into the planning software.  Then the prostate bed target, pelvic lymph node target and avoidance structures including the rectum, bladder, bowel and hips were contoured.  Treatment planning then occurred.  The radiation prescription was entered and confirmed.  A total of one complex treatment devices were fabricated. I have requested : Intensity Modulated Radiotherapy (IMRT) is medically necessary for this case for the following reason:  Rectal sparing.Marland Kitchen  PLAN:  The patient will receive 45 Gy in 25 fractions of 1.8 Gy, followed by a boost to the prostate bed to a total  dose of 68.4 Gy with 13 additional fractions of 1.8 Gy.   ________________________________  Sheral Apley Tammi Klippel, M.D.  This document serves as a record of services personally performed by Tyler Pita, MD. It was created on his behalf by Wilburn Mylar, a trained medical scribe. The creation of this record is based on the scribe's personal observations and the provider's statements to them. This document has been checked and approved by the attending provider.

## 2018-03-30 DIAGNOSIS — C61 Malignant neoplasm of prostate: Secondary | ICD-10-CM | POA: Diagnosis not present

## 2018-03-31 ENCOUNTER — Encounter: Payer: Self-pay | Admitting: Medical Oncology

## 2018-03-31 ENCOUNTER — Ambulatory Visit
Admission: RE | Admit: 2018-03-31 | Discharge: 2018-03-31 | Disposition: A | Discharge status: Home / Self Care | Payer: BC Managed Care – PPO | Source: Ambulatory Visit | Attending: Radiation Oncology | Admitting: Radiation Oncology

## 2018-03-31 DIAGNOSIS — C61 Malignant neoplasm of prostate: Secondary | ICD-10-CM | POA: Diagnosis not present

## 2018-04-01 ENCOUNTER — Ambulatory Visit
Admission: RE | Admit: 2018-04-01 | Discharge: 2018-04-01 | Disposition: A | Discharge status: Home / Self Care | Payer: BC Managed Care – PPO | Source: Ambulatory Visit | Attending: Radiation Oncology | Admitting: Radiation Oncology

## 2018-04-01 DIAGNOSIS — C61 Malignant neoplasm of prostate: Secondary | ICD-10-CM | POA: Diagnosis not present

## 2018-04-04 ENCOUNTER — Ambulatory Visit: Payer: BC Managed Care – PPO

## 2018-04-04 ENCOUNTER — Ambulatory Visit
Admission: RE | Admit: 2018-04-04 | Discharge: 2018-04-04 | Disposition: A | Discharge status: Home / Self Care | Payer: BC Managed Care – PPO | Source: Ambulatory Visit | Attending: Radiation Oncology | Admitting: Radiation Oncology

## 2018-04-04 DIAGNOSIS — C61 Malignant neoplasm of prostate: Secondary | ICD-10-CM | POA: Diagnosis not present

## 2018-04-05 ENCOUNTER — Ambulatory Visit: Payer: BC Managed Care – PPO

## 2018-04-05 ENCOUNTER — Ambulatory Visit
Admission: RE | Admit: 2018-04-05 | Discharge: 2018-04-05 | Disposition: A | Discharge status: Home / Self Care | Payer: BC Managed Care – PPO | Source: Ambulatory Visit | Attending: Radiation Oncology | Admitting: Radiation Oncology

## 2018-04-05 DIAGNOSIS — C61 Malignant neoplasm of prostate: Secondary | ICD-10-CM | POA: Diagnosis not present

## 2018-04-06 ENCOUNTER — Ambulatory Visit: Payer: BC Managed Care – PPO

## 2018-04-06 ENCOUNTER — Ambulatory Visit
Admission: RE | Admit: 2018-04-06 | Discharge: 2018-04-06 | Disposition: A | Discharge status: Home / Self Care | Payer: BC Managed Care – PPO | Source: Ambulatory Visit | Attending: Radiation Oncology | Admitting: Radiation Oncology

## 2018-04-06 DIAGNOSIS — C61 Malignant neoplasm of prostate: Secondary | ICD-10-CM | POA: Diagnosis not present

## 2018-04-07 ENCOUNTER — Ambulatory Visit
Admission: RE | Admit: 2018-04-07 | Discharge: 2018-04-07 | Disposition: A | Discharge status: Home / Self Care | Payer: BC Managed Care – PPO | Source: Ambulatory Visit | Attending: Radiation Oncology | Admitting: Radiation Oncology

## 2018-04-07 ENCOUNTER — Ambulatory Visit: Payer: BC Managed Care – PPO

## 2018-04-07 DIAGNOSIS — C61 Malignant neoplasm of prostate: Secondary | ICD-10-CM | POA: Diagnosis not present

## 2018-04-08 ENCOUNTER — Ambulatory Visit: Payer: BC Managed Care – PPO

## 2018-04-08 ENCOUNTER — Ambulatory Visit
Admission: RE | Admit: 2018-04-08 | Discharge: 2018-04-08 | Disposition: A | Discharge status: Home / Self Care | Payer: BC Managed Care – PPO | Source: Ambulatory Visit | Attending: Radiation Oncology | Admitting: Radiation Oncology

## 2018-04-08 DIAGNOSIS — C61 Malignant neoplasm of prostate: Secondary | ICD-10-CM | POA: Diagnosis not present

## 2018-04-11 ENCOUNTER — Ambulatory Visit
Admission: RE | Admit: 2018-04-11 | Discharge: 2018-04-11 | Disposition: A | Discharge status: Home / Self Care | Payer: BC Managed Care – PPO | Source: Ambulatory Visit | Attending: Radiation Oncology | Admitting: Radiation Oncology

## 2018-04-11 ENCOUNTER — Ambulatory Visit: Payer: BC Managed Care – PPO

## 2018-04-11 DIAGNOSIS — C61 Malignant neoplasm of prostate: Secondary | ICD-10-CM | POA: Diagnosis not present

## 2018-04-12 ENCOUNTER — Ambulatory Visit
Admission: RE | Admit: 2018-04-12 | Discharge: 2018-04-12 | Disposition: A | Discharge status: Home / Self Care | Payer: BC Managed Care – PPO | Source: Ambulatory Visit | Attending: Radiation Oncology | Admitting: Radiation Oncology

## 2018-04-12 ENCOUNTER — Ambulatory Visit: Payer: BC Managed Care – PPO

## 2018-04-12 DIAGNOSIS — C61 Malignant neoplasm of prostate: Secondary | ICD-10-CM | POA: Diagnosis not present

## 2018-04-13 ENCOUNTER — Ambulatory Visit
Admission: RE | Admit: 2018-04-13 | Discharge: 2018-04-13 | Disposition: A | Discharge status: Home / Self Care | Payer: BC Managed Care – PPO | Source: Ambulatory Visit | Attending: Radiation Oncology | Admitting: Radiation Oncology

## 2018-04-13 ENCOUNTER — Ambulatory Visit: Payer: BC Managed Care – PPO

## 2018-04-13 DIAGNOSIS — C61 Malignant neoplasm of prostate: Secondary | ICD-10-CM | POA: Diagnosis not present

## 2018-04-14 ENCOUNTER — Ambulatory Visit
Admission: RE | Admit: 2018-04-14 | Discharge: 2018-04-14 | Disposition: A | Discharge status: Home / Self Care | Payer: BC Managed Care – PPO | Source: Ambulatory Visit | Attending: Radiation Oncology | Admitting: Radiation Oncology

## 2018-04-14 ENCOUNTER — Ambulatory Visit: Payer: BC Managed Care – PPO

## 2018-04-14 DIAGNOSIS — C61 Malignant neoplasm of prostate: Secondary | ICD-10-CM | POA: Diagnosis not present

## 2018-04-15 ENCOUNTER — Ambulatory Visit: Payer: BC Managed Care – PPO

## 2018-04-15 ENCOUNTER — Ambulatory Visit
Admission: RE | Admit: 2018-04-15 | Discharge: 2018-04-15 | Disposition: A | Discharge status: Home / Self Care | Payer: BC Managed Care – PPO | Source: Ambulatory Visit | Attending: Radiation Oncology | Admitting: Radiation Oncology

## 2018-04-15 DIAGNOSIS — Z510 Encounter for antineoplastic radiation therapy: Secondary | ICD-10-CM | POA: Diagnosis present

## 2018-04-15 DIAGNOSIS — C61 Malignant neoplasm of prostate: Secondary | ICD-10-CM | POA: Insufficient documentation

## 2018-04-15 LAB — GLUCOSE, CAPILLARY: GLUCOSE-CAPILLARY: 54 mg/dL — AB (ref 70–99)

## 2018-04-15 NOTE — Progress Notes (Signed)
Received patient in the clinic for PUT encounter. Patient revealed his blood sugar was 54 per his OneTouch monitor. Provided patient with orange juice, peanut butter and graham crackers. Assessed blood sugar approximately 15 minutes later to discover it was up to 91. Then, 15 minutes after that it was up to 107. Advised patient to follow up with PCP. Patient verbalized understanding. Patient discharged in no distress home with wife. Steady gait noted.

## 2018-04-18 ENCOUNTER — Ambulatory Visit: Payer: BC Managed Care – PPO

## 2018-04-18 ENCOUNTER — Ambulatory Visit
Admission: RE | Admit: 2018-04-18 | Discharge: 2018-04-18 | Disposition: A | Discharge status: Home / Self Care | Payer: BC Managed Care – PPO | Source: Ambulatory Visit | Attending: Radiation Oncology | Admitting: Radiation Oncology

## 2018-04-18 DIAGNOSIS — C61 Malignant neoplasm of prostate: Secondary | ICD-10-CM | POA: Diagnosis not present

## 2018-04-19 ENCOUNTER — Ambulatory Visit
Admission: RE | Admit: 2018-04-19 | Discharge: 2018-04-19 | Disposition: A | Discharge status: Home / Self Care | Payer: BC Managed Care – PPO | Source: Ambulatory Visit | Attending: Radiation Oncology | Admitting: Radiation Oncology

## 2018-04-19 ENCOUNTER — Ambulatory Visit: Payer: BC Managed Care – PPO

## 2018-04-19 DIAGNOSIS — C61 Malignant neoplasm of prostate: Secondary | ICD-10-CM | POA: Diagnosis not present

## 2018-04-20 ENCOUNTER — Ambulatory Visit: Payer: BC Managed Care – PPO

## 2018-04-20 ENCOUNTER — Ambulatory Visit
Admission: RE | Admit: 2018-04-20 | Discharge: 2018-04-20 | Disposition: A | Discharge status: Home / Self Care | Payer: BC Managed Care – PPO | Source: Ambulatory Visit | Attending: Radiation Oncology | Admitting: Radiation Oncology

## 2018-04-20 DIAGNOSIS — C61 Malignant neoplasm of prostate: Secondary | ICD-10-CM | POA: Diagnosis not present

## 2018-04-21 ENCOUNTER — Ambulatory Visit
Admission: RE | Admit: 2018-04-21 | Discharge: 2018-04-21 | Disposition: A | Discharge status: Home / Self Care | Payer: BC Managed Care – PPO | Source: Ambulatory Visit | Attending: Radiation Oncology | Admitting: Radiation Oncology

## 2018-04-21 ENCOUNTER — Ambulatory Visit: Payer: BC Managed Care – PPO

## 2018-04-21 DIAGNOSIS — C61 Malignant neoplasm of prostate: Secondary | ICD-10-CM | POA: Diagnosis not present

## 2018-04-22 ENCOUNTER — Ambulatory Visit
Admission: RE | Admit: 2018-04-22 | Discharge: 2018-04-22 | Disposition: A | Discharge status: Home / Self Care | Payer: BC Managed Care – PPO | Source: Ambulatory Visit | Attending: Radiation Oncology | Admitting: Radiation Oncology

## 2018-04-22 ENCOUNTER — Ambulatory Visit: Payer: BC Managed Care – PPO

## 2018-04-22 DIAGNOSIS — C61 Malignant neoplasm of prostate: Secondary | ICD-10-CM | POA: Diagnosis not present

## 2018-04-25 ENCOUNTER — Ambulatory Visit
Admission: RE | Admit: 2018-04-25 | Discharge: 2018-04-25 | Disposition: A | Discharge status: Home / Self Care | Payer: BC Managed Care – PPO | Source: Ambulatory Visit | Attending: Radiation Oncology | Admitting: Radiation Oncology

## 2018-04-25 ENCOUNTER — Ambulatory Visit: Payer: BC Managed Care – PPO

## 2018-04-25 DIAGNOSIS — C61 Malignant neoplasm of prostate: Secondary | ICD-10-CM | POA: Diagnosis not present

## 2018-04-26 ENCOUNTER — Ambulatory Visit
Admission: RE | Admit: 2018-04-26 | Discharge: 2018-04-26 | Disposition: A | Discharge status: Home / Self Care | Payer: BC Managed Care – PPO | Source: Ambulatory Visit | Attending: Radiation Oncology | Admitting: Radiation Oncology

## 2018-04-26 ENCOUNTER — Ambulatory Visit: Payer: BC Managed Care – PPO

## 2018-04-26 DIAGNOSIS — C61 Malignant neoplasm of prostate: Secondary | ICD-10-CM | POA: Diagnosis not present

## 2018-04-27 ENCOUNTER — Ambulatory Visit: Payer: BC Managed Care – PPO

## 2018-04-27 ENCOUNTER — Ambulatory Visit
Admission: RE | Admit: 2018-04-27 | Discharge: 2018-04-27 | Disposition: A | Discharge status: Home / Self Care | Payer: BC Managed Care – PPO | Source: Ambulatory Visit | Attending: Radiation Oncology | Admitting: Radiation Oncology

## 2018-04-27 DIAGNOSIS — C61 Malignant neoplasm of prostate: Secondary | ICD-10-CM | POA: Diagnosis not present

## 2018-04-28 ENCOUNTER — Ambulatory Visit: Payer: BC Managed Care – PPO

## 2018-04-28 ENCOUNTER — Ambulatory Visit
Admission: RE | Admit: 2018-04-28 | Discharge: 2018-04-28 | Disposition: A | Discharge status: Home / Self Care | Payer: BC Managed Care – PPO | Source: Ambulatory Visit | Attending: Radiation Oncology | Admitting: Radiation Oncology

## 2018-04-28 DIAGNOSIS — C61 Malignant neoplasm of prostate: Secondary | ICD-10-CM | POA: Diagnosis not present

## 2018-04-29 ENCOUNTER — Ambulatory Visit
Admission: RE | Admit: 2018-04-29 | Discharge: 2018-04-29 | Disposition: A | Discharge status: Home / Self Care | Payer: BC Managed Care – PPO | Source: Ambulatory Visit | Attending: Radiation Oncology | Admitting: Radiation Oncology

## 2018-04-29 ENCOUNTER — Ambulatory Visit: Payer: BC Managed Care – PPO

## 2018-04-29 DIAGNOSIS — C61 Malignant neoplasm of prostate: Secondary | ICD-10-CM | POA: Diagnosis not present

## 2018-05-02 ENCOUNTER — Ambulatory Visit
Admission: RE | Admit: 2018-05-02 | Discharge: 2018-05-02 | Disposition: A | Discharge status: Home / Self Care | Payer: BC Managed Care – PPO | Source: Ambulatory Visit | Attending: Radiation Oncology | Admitting: Radiation Oncology

## 2018-05-02 ENCOUNTER — Ambulatory Visit: Payer: BC Managed Care – PPO

## 2018-05-02 DIAGNOSIS — C61 Malignant neoplasm of prostate: Secondary | ICD-10-CM | POA: Diagnosis not present

## 2018-05-03 ENCOUNTER — Ambulatory Visit: Payer: BC Managed Care – PPO

## 2018-05-03 ENCOUNTER — Ambulatory Visit
Admission: RE | Admit: 2018-05-03 | Discharge: 2018-05-03 | Disposition: A | Discharge status: Home / Self Care | Payer: BC Managed Care – PPO | Source: Ambulatory Visit | Attending: Radiation Oncology | Admitting: Radiation Oncology

## 2018-05-03 DIAGNOSIS — C61 Malignant neoplasm of prostate: Secondary | ICD-10-CM | POA: Diagnosis not present

## 2018-05-04 ENCOUNTER — Ambulatory Visit
Admission: RE | Admit: 2018-05-04 | Discharge: 2018-05-04 | Disposition: A | Discharge status: Home / Self Care | Payer: BC Managed Care – PPO | Source: Ambulatory Visit | Attending: Radiation Oncology | Admitting: Radiation Oncology

## 2018-05-04 ENCOUNTER — Ambulatory Visit: Payer: BC Managed Care – PPO

## 2018-05-04 DIAGNOSIS — C61 Malignant neoplasm of prostate: Secondary | ICD-10-CM | POA: Diagnosis not present

## 2018-05-05 ENCOUNTER — Ambulatory Visit: Payer: BC Managed Care – PPO

## 2018-05-05 ENCOUNTER — Ambulatory Visit
Admission: RE | Admit: 2018-05-05 | Discharge: 2018-05-05 | Disposition: A | Discharge status: Home / Self Care | Payer: BC Managed Care – PPO | Source: Ambulatory Visit | Attending: Radiation Oncology | Admitting: Radiation Oncology

## 2018-05-05 DIAGNOSIS — C61 Malignant neoplasm of prostate: Secondary | ICD-10-CM | POA: Diagnosis not present

## 2018-05-06 ENCOUNTER — Ambulatory Visit
Admission: RE | Admit: 2018-05-06 | Discharge: 2018-05-06 | Disposition: A | Discharge status: Home / Self Care | Payer: BC Managed Care – PPO | Source: Ambulatory Visit | Attending: Radiation Oncology | Admitting: Radiation Oncology

## 2018-05-06 ENCOUNTER — Ambulatory Visit: Payer: BC Managed Care – PPO

## 2018-05-06 DIAGNOSIS — C61 Malignant neoplasm of prostate: Secondary | ICD-10-CM | POA: Diagnosis not present

## 2018-05-08 ENCOUNTER — Ambulatory Visit
Admission: RE | Admit: 2018-05-08 | Discharge: 2018-05-08 | Disposition: A | Discharge status: Home / Self Care | Payer: BC Managed Care – PPO | Source: Ambulatory Visit | Attending: Radiation Oncology | Admitting: Radiation Oncology

## 2018-05-08 DIAGNOSIS — C61 Malignant neoplasm of prostate: Secondary | ICD-10-CM | POA: Diagnosis not present

## 2018-05-09 ENCOUNTER — Ambulatory Visit
Admission: RE | Admit: 2018-05-09 | Discharge: 2018-05-09 | Disposition: A | Discharge status: Home / Self Care | Payer: BC Managed Care – PPO | Source: Ambulatory Visit | Attending: Radiation Oncology | Admitting: Radiation Oncology

## 2018-05-09 ENCOUNTER — Ambulatory Visit: Payer: BC Managed Care – PPO

## 2018-05-09 DIAGNOSIS — C61 Malignant neoplasm of prostate: Secondary | ICD-10-CM | POA: Diagnosis not present

## 2018-05-10 ENCOUNTER — Ambulatory Visit: Payer: BC Managed Care – PPO

## 2018-05-10 ENCOUNTER — Ambulatory Visit
Admission: RE | Admit: 2018-05-10 | Discharge: 2018-05-10 | Disposition: A | Discharge status: Home / Self Care | Payer: BC Managed Care – PPO | Source: Ambulatory Visit | Attending: Radiation Oncology | Admitting: Radiation Oncology

## 2018-05-10 DIAGNOSIS — C61 Malignant neoplasm of prostate: Secondary | ICD-10-CM | POA: Diagnosis not present

## 2018-05-11 ENCOUNTER — Ambulatory Visit: Payer: BC Managed Care – PPO

## 2018-05-11 ENCOUNTER — Ambulatory Visit
Admission: RE | Admit: 2018-05-11 | Discharge: 2018-05-11 | Disposition: A | Discharge status: Home / Self Care | Payer: BC Managed Care – PPO | Source: Ambulatory Visit | Attending: Radiation Oncology | Admitting: Radiation Oncology

## 2018-05-11 DIAGNOSIS — C61 Malignant neoplasm of prostate: Secondary | ICD-10-CM | POA: Diagnosis not present

## 2018-05-16 ENCOUNTER — Ambulatory Visit
Admission: RE | Admit: 2018-05-16 | Discharge: 2018-05-16 | Disposition: A | Discharge status: Home / Self Care | Payer: BC Managed Care – PPO | Source: Ambulatory Visit | Attending: Radiation Oncology | Admitting: Radiation Oncology

## 2018-05-16 ENCOUNTER — Ambulatory Visit: Payer: BC Managed Care – PPO

## 2018-05-16 DIAGNOSIS — C61 Malignant neoplasm of prostate: Secondary | ICD-10-CM | POA: Diagnosis not present

## 2018-05-16 DIAGNOSIS — Z510 Encounter for antineoplastic radiation therapy: Secondary | ICD-10-CM | POA: Insufficient documentation

## 2018-05-17 ENCOUNTER — Ambulatory Visit: Payer: BC Managed Care – PPO

## 2018-05-17 ENCOUNTER — Ambulatory Visit
Admission: RE | Admit: 2018-05-17 | Discharge: 2018-05-17 | Disposition: A | Discharge status: Home / Self Care | Payer: BC Managed Care – PPO | Source: Ambulatory Visit | Attending: Radiation Oncology | Admitting: Radiation Oncology

## 2018-05-17 DIAGNOSIS — C61 Malignant neoplasm of prostate: Secondary | ICD-10-CM | POA: Diagnosis not present

## 2018-05-18 ENCOUNTER — Ambulatory Visit: Payer: BC Managed Care – PPO

## 2018-05-18 ENCOUNTER — Ambulatory Visit
Admission: RE | Admit: 2018-05-18 | Discharge: 2018-05-18 | Disposition: A | Discharge status: Home / Self Care | Payer: BC Managed Care – PPO | Source: Ambulatory Visit | Attending: Radiation Oncology | Admitting: Radiation Oncology

## 2018-05-18 DIAGNOSIS — C61 Malignant neoplasm of prostate: Secondary | ICD-10-CM | POA: Diagnosis not present

## 2018-05-19 ENCOUNTER — Ambulatory Visit
Admission: RE | Admit: 2018-05-19 | Discharge: 2018-05-19 | Disposition: A | Discharge status: Home / Self Care | Payer: BC Managed Care – PPO | Source: Ambulatory Visit | Attending: Radiation Oncology | Admitting: Radiation Oncology

## 2018-05-19 ENCOUNTER — Ambulatory Visit: Payer: BC Managed Care – PPO

## 2018-05-19 DIAGNOSIS — C61 Malignant neoplasm of prostate: Secondary | ICD-10-CM | POA: Diagnosis not present

## 2018-05-20 ENCOUNTER — Ambulatory Visit
Admission: RE | Admit: 2018-05-20 | Discharge: 2018-05-20 | Disposition: A | Discharge status: Home / Self Care | Payer: BC Managed Care – PPO | Source: Ambulatory Visit | Attending: Radiation Oncology | Admitting: Radiation Oncology

## 2018-05-20 ENCOUNTER — Ambulatory Visit: Payer: BC Managed Care – PPO

## 2018-05-20 DIAGNOSIS — C61 Malignant neoplasm of prostate: Secondary | ICD-10-CM | POA: Diagnosis not present

## 2018-05-23 ENCOUNTER — Ambulatory Visit: Payer: BC Managed Care – PPO

## 2018-05-23 ENCOUNTER — Ambulatory Visit
Admission: RE | Admit: 2018-05-23 | Discharge: 2018-05-23 | Disposition: A | Discharge status: Home / Self Care | Payer: BC Managed Care – PPO | Source: Ambulatory Visit | Attending: Radiation Oncology | Admitting: Radiation Oncology

## 2018-05-23 DIAGNOSIS — C61 Malignant neoplasm of prostate: Secondary | ICD-10-CM | POA: Diagnosis not present

## 2018-05-24 ENCOUNTER — Encounter: Payer: Self-pay | Admitting: Radiation Oncology

## 2018-05-24 ENCOUNTER — Ambulatory Visit: Payer: BC Managed Care – PPO

## 2018-05-24 ENCOUNTER — Ambulatory Visit
Admission: RE | Admit: 2018-05-24 | Discharge: 2018-05-24 | Disposition: A | Discharge status: Home / Self Care | Payer: BC Managed Care – PPO | Source: Ambulatory Visit | Attending: Radiation Oncology | Admitting: Radiation Oncology

## 2018-05-24 DIAGNOSIS — C61 Malignant neoplasm of prostate: Secondary | ICD-10-CM | POA: Diagnosis not present

## 2018-05-25 ENCOUNTER — Ambulatory Visit: Payer: BC Managed Care – PPO

## 2018-05-26 ENCOUNTER — Ambulatory Visit: Payer: BC Managed Care – PPO

## 2018-05-26 NOTE — Progress Notes (Signed)
  Radiation Oncology         [phone] ________________________________  Name: [name] MRN: [id number]  Date: 05/24/2018  DOB: 1966/01/27  End of Treatment Note  Diagnosis:   52 y.o. male following prostatectomy with extraprostatic extension identified at the right posterolateral at the apex, mid, and base, with lymphovascular invasion, and with negative margins with one of 14 lymph nodes positive for metastatic disease, with a Gleason score of 4+5=9with detectable rising PSA of 0.22       Indication for treatment:  Curative, Salvage Prostatic Fossa Radiotherapy       Radiation treatment dates:   03/31/2018 - 05/24/2018  Site/dose:   The patient received 45 Gy in 25 fractions of 1.8 Gy, followed by a boost to the prostate bed to a total dose of 68.4 Gy with 13 additional fractions of 1.8 Gy.  Beams/energy:   The prostatic fossa was treated using helical intensity modulated radiotherapy delivering 6 megavolt photons. Image guidance was performed with megavoltage CT studies prior to each fraction. He was immobilized with a body fix lower extremity mold.  Narrative: The patient tolerated radiation treatment relatively well.   He experienced mild urinary irritation with incomplete emptying, leakage, and nocturia x1. He also reported having some diarrhea but takes Imodium with relief.  Plan: The patient has completed radiation treatment. He will return to radiation oncology clinic for routine followup in one month. I advised him to call or return sooner if he has any questions or concerns related to his recovery or treatment. ________________________________  Sheral Apley. Tammi Klippel, M.D.  This document serves as a record of services personally performed by Tyler Pita, MD. It was created on his behalf by Rae Lips, a trained medical scribe. The creation of this record is based on the scribe's personal observations and the provider's statements to them. This document has been  checked and approved by the attending provider.

## 2018-05-27 ENCOUNTER — Ambulatory Visit: Payer: BC Managed Care – PPO

## 2018-06-29 ENCOUNTER — Other Ambulatory Visit: Payer: Self-pay

## 2018-06-29 ENCOUNTER — Ambulatory Visit
Admission: RE | Admit: 2018-06-29 | Discharge: 2018-06-29 | Disposition: A | Discharge status: Home / Self Care | Payer: BC Managed Care – PPO | Source: Ambulatory Visit | Attending: Urology | Admitting: Urology

## 2018-06-29 ENCOUNTER — Encounter: Payer: Self-pay | Admitting: Urology

## 2018-06-29 VITALS — BP 142/84 | HR 106 | Temp 98.1°F | Wt 214.8 lb

## 2018-06-29 DIAGNOSIS — Z794 Long term (current) use of insulin: Secondary | ICD-10-CM | POA: Diagnosis not present

## 2018-06-29 DIAGNOSIS — Z79899 Other long term (current) drug therapy: Secondary | ICD-10-CM | POA: Diagnosis not present

## 2018-06-29 DIAGNOSIS — R197 Diarrhea, unspecified: Secondary | ICD-10-CM | POA: Diagnosis not present

## 2018-06-29 DIAGNOSIS — C61 Malignant neoplasm of prostate: Secondary | ICD-10-CM | POA: Insufficient documentation

## 2018-06-29 DIAGNOSIS — R351 Nocturia: Secondary | ICD-10-CM | POA: Insufficient documentation

## 2018-06-29 DIAGNOSIS — R9721 Rising PSA following treatment for malignant neoplasm of prostate: Secondary | ICD-10-CM | POA: Diagnosis not present

## 2018-06-29 NOTE — Progress Notes (Signed)
Radiation Oncology         [phone] ________________________________  Name: SUBHAN HOOPES MRN: [id number]  Date: 06/29/2018  DOB: 1965-07-30  Post Treatment Note  CC: Sandi Mariscal, MD  Raynelle Bring, MD  Diagnosis:   53 y.o. gentleman with biochemical recurrence of prostate cancer following prostatectomy, withdetectable risingPSA of0.22.    Interval Since Last Radiation:  5 weeks  03/31/2018 - 05/24/2018:   The patient received 45 Gy in 25 fractions of 1.8 Gy, followed by a boost to the prostate bed to a total dose of 68.4 Gy with 13 additional fractions of 1.8 Gy.  Narrative:  The patient returns today for routine follow-up.  He tolerated radiation treatment relatively well.   He experienced mild urinary irritation with incomplete emptying, leakage, and nocturia x1. He also reported having some diarrhea which was relieved with Imodium prn.                              On review of systems, the patient states that he is doing well overall.  He has noticed a gradual improvement in his lower urinary tract symptoms.  He specifically denies dysuria, gross hematuria, straining to void or incontinence.  He continues with nocturia 2-3 times per night as well as urgency which is gradually improving.  He denies excessive daytime frequency.  He reports a healthy appetite and is maintaining his weight.  He denies abdominal pain, nausea, vomiting, diarrhea or constipation.  At this point, he is quite pleased with his progress to date.  ALLERGIES:  has No Known Allergies.  Meds: Current Outpatient Medications  Medication Sig Dispense Refill  . amLODipine (NORVASC) 2.5 MG tablet     . insulin lispro (HUMALOG) 100 UNIT/ML injection Inject into the skin.    Marland Kitchen NOVOLOG FLEXPEN 100 UNIT/ML FlexPen Inject 4-8 Units into the skin 3 (three) times daily. Sliding Scale    . PROAIR HFA 108 (90 Base) MCG/ACT inhaler Inhale 1 puff into the lungs every 6 (six) hours as needed for wheezing or shortness of breath.      . ramipril (ALTACE) 10 MG capsule Take 10 mg by mouth daily.    . TRESIBA FLEXTOUCH 200 UNIT/ML SOPN Inject 36 Units into the skin every morning.      No current facility-administered medications for this encounter.     Physical Findings:  weight is 214 lb 12.8 oz (97.4 kg). His oral temperature is 98.1 F (36.7 C). His blood pressure is 142/84 (abnormal) and his pulse is 106 (abnormal). His oxygen saturation is 100%.  Pain Assessment Pain Score: 0-No pain/10 In general this is a well appearing Caucasian male in no acute distress.  He's alert and oriented x4 and appropriate throughout the examination. Cardiopulmonary assessment is negative for acute distress and he exhibits normal effort.   Lab Findings: Lab Results  Component Value Date   WBC 4.9 08/22/2015   HGB 12.9 (L) 09/03/2015   HCT 37.6 (L) 09/03/2015   MCV 86.1 08/22/2015   PLT 306 08/22/2015     Radiographic Findings: No results found.  Impression/Plan: 1. 53 y.o. gentleman with biochemical recurrence of prostate cancer following prostatectomy, withdetectable risingPSA of0.22.   He will continue to follow up with urology for ongoing PSA determinations and has an appointment scheduled with Dr. Alinda Money on 08/02/18. He understands what to expect with regards to PSA monitoring going forward. I will look forward to following his response to treatment  via correspondence with urology, and would be happy to continue to participate in his care if clinically indicated. I talked to the patient about what to expect in the future, including his risk for erectile dysfunction and rectal bleeding. I encouraged him to call or return to the office if he has any questions regarding his previous radiation or possible radiation side effects. He was comfortable with this plan and will follow up as needed.    Nicholos Johns, PA-C

## 2018-09-08 DIAGNOSIS — Z0000 Encounter for general adult medical examination without abnormal findings: Secondary | ICD-10-CM | POA: Insufficient documentation

## 2018-09-19 IMAGING — PT NM PET NOPR SKULL BASE TO THIGH
8 series · 25 of 25 positions shown · non-contrast
Comparison: CT 07/22/2015

CLINICAL DATA: Prostate carcinoma with biochemical recurrence. PSA
equal

EXAM:
NUCLEAR MEDICINE PET SKULL BASE TO THIGH
TECHNIQUE: 10.2 mCi F-18 Fluciclovine was injected intravenously. Full-ring PET
imaging was performed from the skull base to thigh after the
radiotracer. CT data was obtained and used for attenuation
correction and anatomic localization.

[Series 4: pet sk_thigh ac · axial · 2.0mm · 4.07mm/px · z∈[+693,+1645]mm · 8 of 471 slices shown]
[im 1/471]
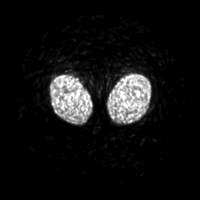
[im 68/471]
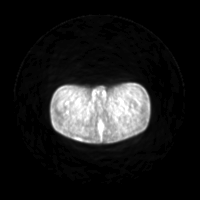
[im 135/471]
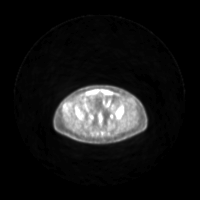
[im 202/471]
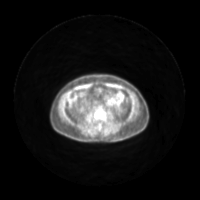
[im 269/471]
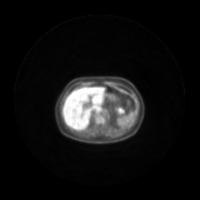
[im 336/471]
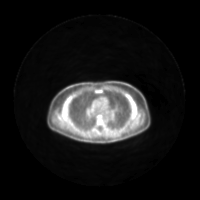
[im 403/471]
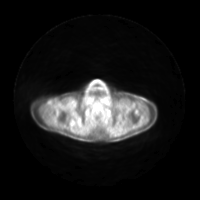
[im 471/471]
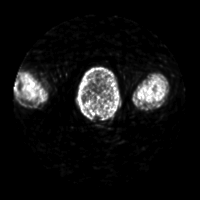

[Series 5: ct sk_thigh 5.0 b31f · axial · 5.0mm · 0.98mm/px · z∈[+692,+1644]mm · 4 of 239 slices shown]
[im 1/239]
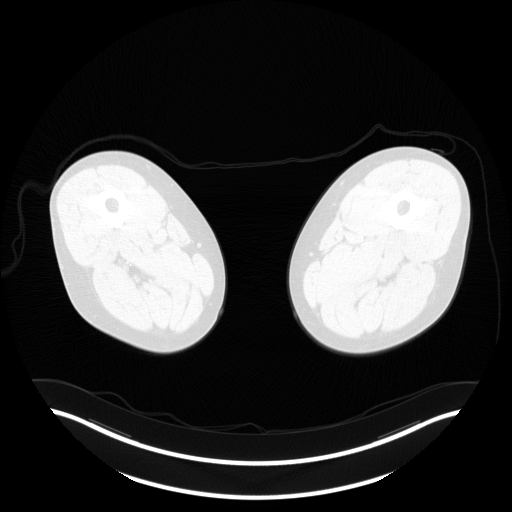
[im 80/239]
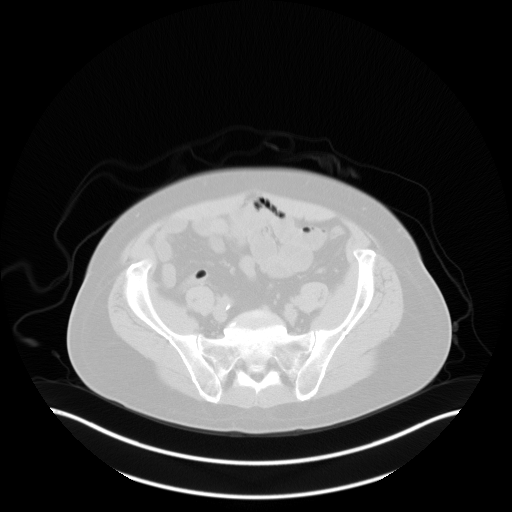
[im 159/239]
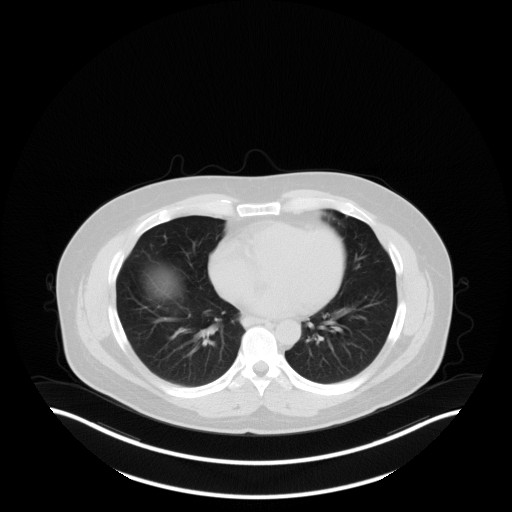
[im 239/239  brain]
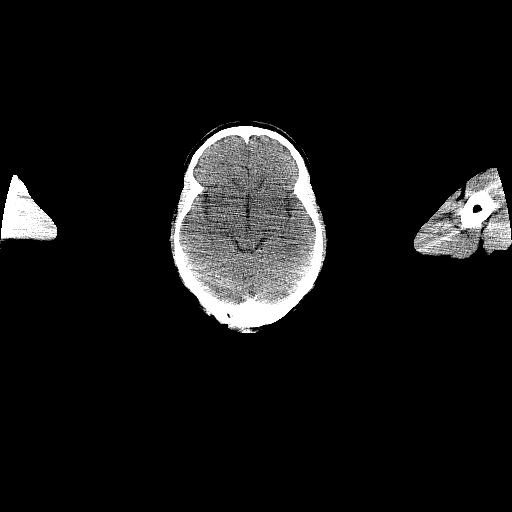

[Series 6: pet sk_thigh nac · axial · 5.0mm · 4.07mm/px · z∈[+692,+1644]mm · 5 of 239 slices shown]
[im 1/239]
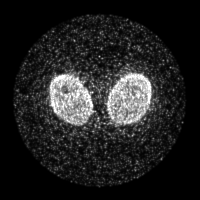
[im 60/239]
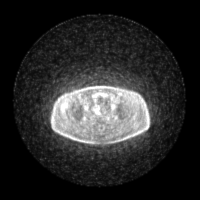
[im 120/239]
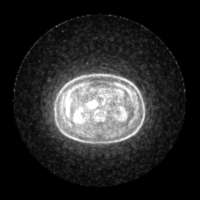
[im 179/239]
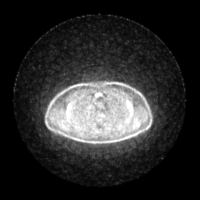
[im 239/239]
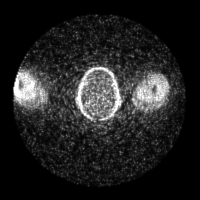

[Series 8: ct sk_thigh 5.0 (id)_bone · axial · 5.0mm · 0.65mm/px · 1 of 63 slices shown]
[im 1/63  bone]
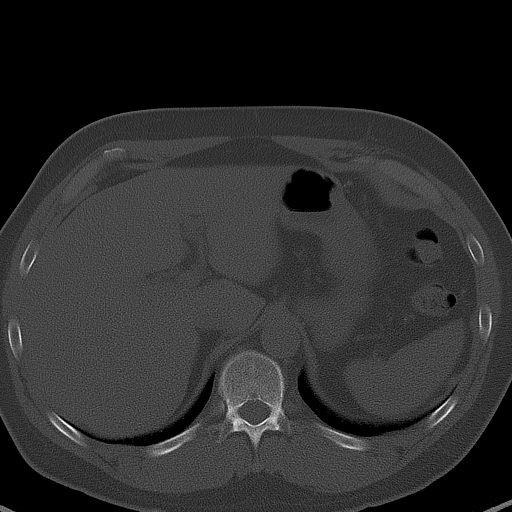

[Series 603: mip range · coronal · 1.97mm/px · 1 of 32 slices shown]
[im 1/32]
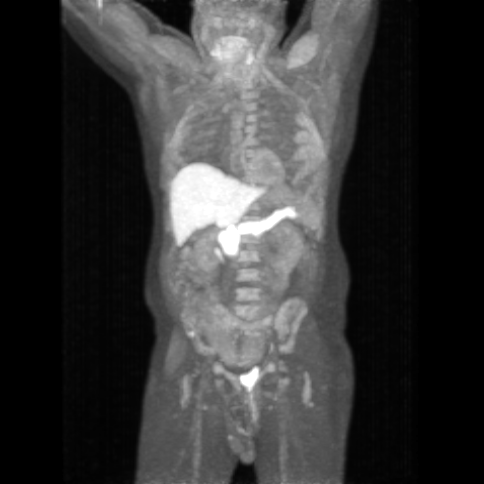

[Series 604: range-ct sk_thigh 5.0 (id)<alpha range> · 1 of 45 slices shown (1 of 2)]
[im 1/45]
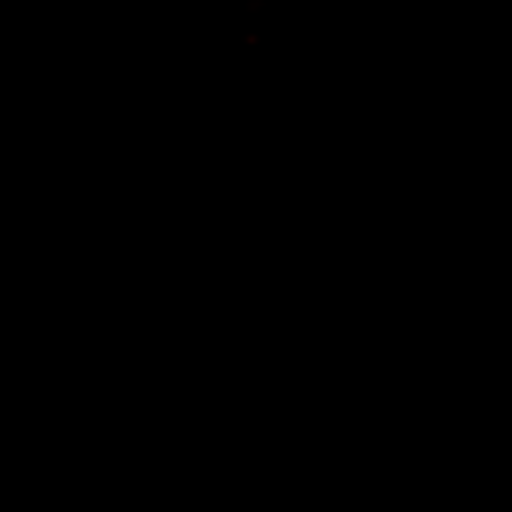

[Series 605: range-ct sk_thigh 5.0 (id)<alpha range> · 4 of 229 slices shown (2 of 2)]
[im 1/229]
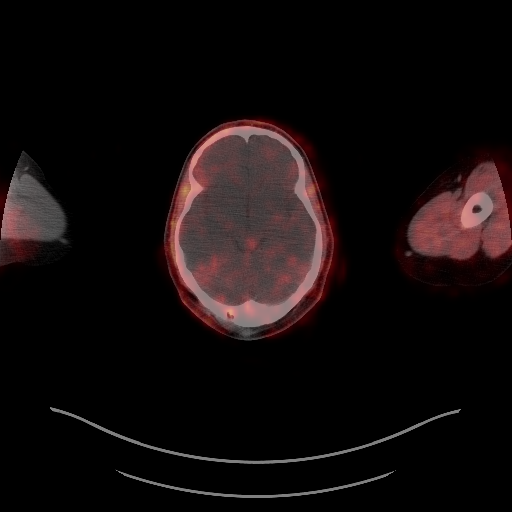
[im 77/229]
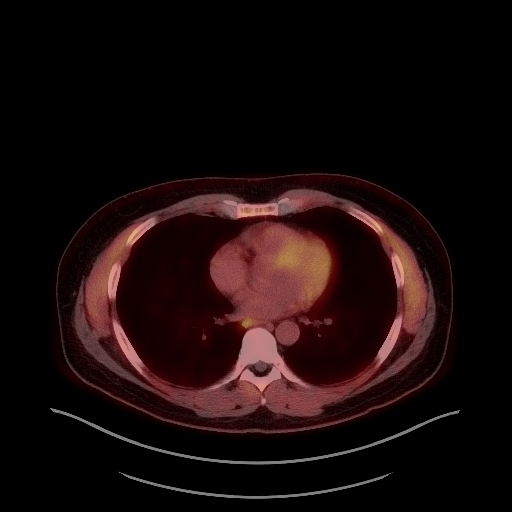
[im 153/229]
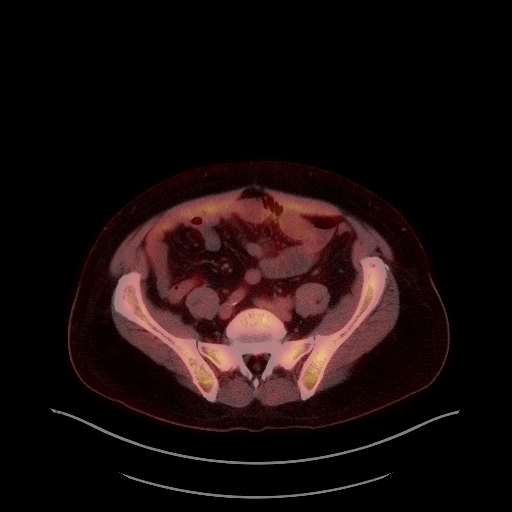
[im 229/229]
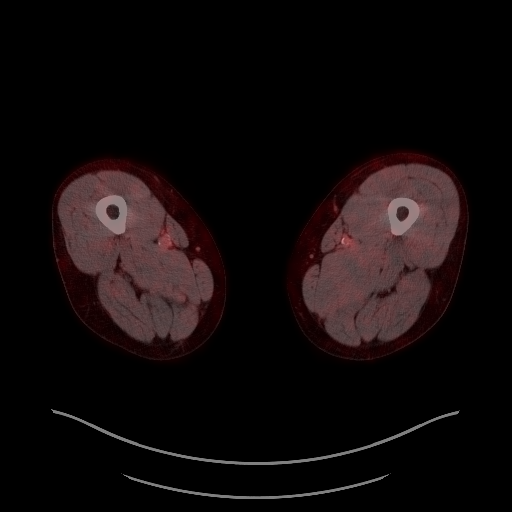

[Series 1072: results mm oncology reading · 1.1mm · 0.69mm/px · 1 of 1 slices shown]
[im 1/1]
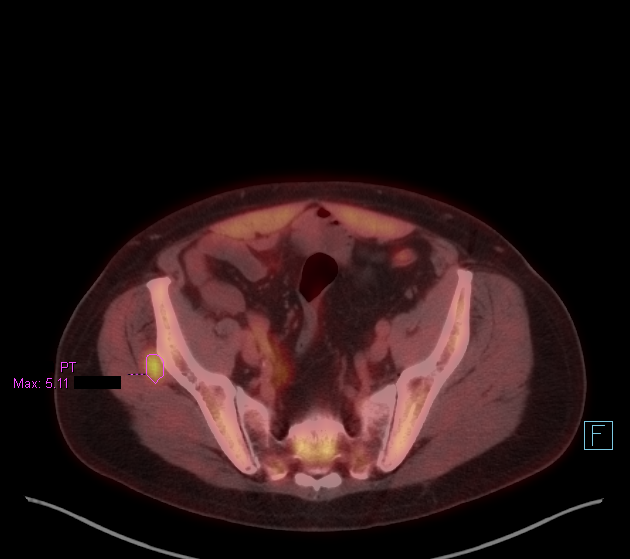

[25 of 25 positions shown; findings below may reference images not displayed]

FINDINGS: NECK

No radiotracer activity in neck lymph nodes.

Incidental CT finding: None

CHEST

No radiotracer accumulation within mediastinal or hilar lymph nodes.
No suspicious pulmonary nodules on the CT scan.

Incidental CT finding: None

ABDOMEN/PELVIS

Prostate: No focal activity in the prostate bed.

Lymph nodes: No abnormal radiotracer accumulation within pelvic or
abdominal nodes.

Liver: No evidence of liver metastasis

Focal activity within the musculature superficial to the RIGHT iliac
wing SUV max equal 5.1. Subtle low-density lesion at this level
measuring 15 mm (image 164/5.)

Incidental CT finding: Calcification of the vas deferens.

SKELETON

No focal  activity to suggest skeletal metastasis.
IMPRESSION: 1. No evidence of local recurrence in the prostate bed or metastatic
lymph nodes in the pelvis.
2. No evidence of distant metastatic disease or skeletal metastasis.
3. Focal activity within the musculature superficial to the RIGHT
iliac wing would be highly unusual location for solitary prostate
cancer metastasis. Favor inflammatory lesion or other benign lesion.

## 2019-05-19 DIAGNOSIS — E669 Obesity, unspecified: Secondary | ICD-10-CM | POA: Insufficient documentation

## 2020-05-18 ENCOUNTER — Ambulatory Visit: Payer: BC Managed Care – PPO | Attending: Internal Medicine

## 2020-05-18 DIAGNOSIS — Z23 Encounter for immunization: Secondary | ICD-10-CM

## 2020-05-18 NOTE — Progress Notes (Signed)
   Covid-19 Vaccination Clinic  Name:  [name]    MRN: [id number] DOB: July 15, 1965  05/18/2020  [name] was observed post Covid-19 immunization for 15 minutes without incident. He was provided with Vaccine Information Sheet and instruction to access the V-Safe system.   [name] was instructed to call 911 with any severe reactions post vaccine: Marland Kitchen Difficulty breathing  . Swelling of face and throat  . A fast heartbeat  . A bad rash all over body  . Dizziness and weakness   Immunizations Administered    Name Date Dose VIS Date Route   JANSSEN COVID-19 VACCINE 05/18/2020  1:15 PM 0.5 mL 04/03/2020 Intramuscular   Manufacturer: [employer]   Lot: [id number]   [place]: [phone]

## 2021-02-14 ENCOUNTER — Ambulatory Visit (HOSPITAL_COMMUNITY)
Admission: RE | Admit: 2021-02-14 | Discharge: 2021-02-14 | Disposition: A | Discharge status: Home / Self Care | Payer: BC Managed Care – PPO | Source: Ambulatory Visit | Attending: Cardiovascular Disease | Admitting: Cardiovascular Disease

## 2021-02-14 ENCOUNTER — Other Ambulatory Visit (HOSPITAL_COMMUNITY): Payer: Self-pay | Admitting: Sports Medicine

## 2021-02-14 ENCOUNTER — Other Ambulatory Visit: Payer: Self-pay

## 2021-02-14 DIAGNOSIS — M25562 Pain in left knee: Secondary | ICD-10-CM | POA: Insufficient documentation

## 2021-02-14 DIAGNOSIS — M25572 Pain in left ankle and joints of left foot: Secondary | ICD-10-CM | POA: Insufficient documentation

## 2021-02-14 DIAGNOSIS — M79605 Pain in left leg: Secondary | ICD-10-CM | POA: Diagnosis not present

## 2021-03-03 DIAGNOSIS — S82402A Unspecified fracture of shaft of left fibula, initial encounter for closed fracture: Secondary | ICD-10-CM | POA: Insufficient documentation

## 2021-07-02 ENCOUNTER — Other Ambulatory Visit (HOSPITAL_COMMUNITY): Payer: Self-pay | Admitting: Urology

## 2021-07-02 DIAGNOSIS — C61 Malignant neoplasm of prostate: Secondary | ICD-10-CM

## 2021-07-09 ENCOUNTER — Ambulatory Visit (HOSPITAL_COMMUNITY)
Admission: RE | Admit: 2021-07-09 | Discharge: 2021-07-09 | Disposition: A | Discharge status: Home / Self Care | Payer: BC Managed Care – PPO | Source: Ambulatory Visit | Attending: Urology | Admitting: Urology

## 2021-07-09 ENCOUNTER — Other Ambulatory Visit: Payer: Self-pay

## 2021-07-09 DIAGNOSIS — C61 Malignant neoplasm of prostate: Secondary | ICD-10-CM | POA: Diagnosis present

## 2021-07-09 MED ORDER — PIFLIFOLASTAT F 18 (PYLARIFY) INJECTION
9.0000 | Freq: Once | INTRAVENOUS | Status: AC
  Administered 2021-07-09: 15:00:00 8.9 via INTRAVENOUS

## 2021-07-28 ENCOUNTER — Telehealth: Payer: Self-pay | Admitting: Radiation Oncology

## 2021-07-28 NOTE — Telephone Encounter (Signed)
Called patient to schedule a consultation w. Dr. Manning. No answer, LVM for a return call.  ?

## 2021-08-06 NOTE — Progress Notes (Signed)
Histology and Location of Primary Cancer: Biochemically recurrent prostate Ca with mets to right 8th rib.  PSA:  5.57 05/2021, 1.33 12/2020, and 0.38 06/2020.  Sites of Visceral and Bony Metastatic Disease:   Location(s) of Symptomatic Metastases:    Past/Anticipated chemotherapy by medical oncology, if any:   Pain on a scale of 0-10 is:  0/10  IPSS:  9 SHIM:  5   If Spine Met(s), symptoms, if any, include: Bowel/Bladder retention or incontinence (please describe): No bowel or bladder issues Numbness or weakness in extremities (please describe): No Current Decadron regimen, if applicable:  No  Ambulatory status?None  SAFETY ISSUES: Prior radiation? Yes, 03/31/2018-05/24/2018 (Prostate Ca) Pacemaker/ICD? No Possible current pregnancy? Male Is the patient on methotrexate? No  Current Complaints / other details:

## 2021-08-11 ENCOUNTER — Other Ambulatory Visit: Payer: Self-pay

## 2021-08-11 ENCOUNTER — Ambulatory Visit
Admission: RE | Admit: 2021-08-11 | Discharge: 2021-08-11 | Disposition: A | Discharge status: Home / Self Care | Payer: BC Managed Care – PPO | Source: Ambulatory Visit | Attending: Radiation Oncology | Admitting: Radiation Oncology

## 2021-08-11 VITALS — BP 157/81 | HR 95 | Temp 97.0°F | Resp 18 | Ht 70.0 in | Wt 217.2 lb

## 2021-08-11 DIAGNOSIS — Z87891 Personal history of nicotine dependence: Secondary | ICD-10-CM | POA: Insufficient documentation

## 2021-08-11 DIAGNOSIS — E119 Type 2 diabetes mellitus without complications: Secondary | ICD-10-CM | POA: Diagnosis not present

## 2021-08-11 DIAGNOSIS — C7951 Secondary malignant neoplasm of bone: Secondary | ICD-10-CM | POA: Diagnosis not present

## 2021-08-11 DIAGNOSIS — I700 Atherosclerosis of aorta: Secondary | ICD-10-CM | POA: Insufficient documentation

## 2021-08-11 DIAGNOSIS — R590 Localized enlarged lymph nodes: Secondary | ICD-10-CM | POA: Diagnosis not present

## 2021-08-11 DIAGNOSIS — I10 Essential (primary) hypertension: Secondary | ICD-10-CM | POA: Insufficient documentation

## 2021-08-11 DIAGNOSIS — C61 Malignant neoplasm of prostate: Secondary | ICD-10-CM | POA: Diagnosis not present

## 2021-08-11 DIAGNOSIS — E103523 Type 1 diabetes mellitus with proliferative diabetic retinopathy with traction retinal detachment involving the macula, bilateral: Secondary | ICD-10-CM | POA: Insufficient documentation

## 2021-08-11 NOTE — Progress Notes (Signed)
Radiation Oncology         [phone] ________________________________  Initial Outpatient Consultation  Name: DEVELLE SIEVERS MRN: [id number]  Date: 08/11/2021  DOB: 1966-04-23  WN:IOEVO, Annie Main, MD  Raynelle Bring, MD   REFERRING PHYSICIAN: Raynelle Bring, MD  DIAGNOSIS: 56 y.o. gentleman with oligometastatic recurrence of prostate cancer to the right posterior 8th rib, prescacral lymph node and paraaortic lymph nodes.    ICD-10-CM   1. Prostate cancer [facility])  C61       HISTORY OF PRESENT ILLNESS: MATISSE SALAIS is a 57 y.o. male with a diagnosis of prostate cancer. He had prostatectomy, which was performed on 09/02/15.  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 One of 14 lymph nodes was positive for metastatic disease, with a Gleason score of 4+5=9. He has been followed in surveillance with PSA, and was 0.069 on 05/18/17, 0.10 on 08/30/17, 0.22 on 12/31/17. PET scan performed on 02/10/18 showed no signs of metastatic disease.   The patient received 45 Gy in 25 fractions of 1.8 Gy, followed by a boost to the prostate bed to a total dose of 68.4 Gy with 13 additional fractions of 1.8 Gy - 03/31/2018 - 05/24/2018.  PSA was as low as 0.022 on 07/26/18 and increased as shown to 5/57 on 06/10/21  So, PSMA PET was completed on 07/09/21 showing: 1. Solitary sclerotic skeletal metastasis in the posterior RIGHT eighth rib with intense radiotracer activity. Findings consistent with oligometastatic skeletal diease.   2. Tiny lymph node in the presacral space with intense radiotracer activity most consistent with a small nodal metastasis. Cluster of small periaortic retroperitoneal nodes of similar size are also concerning for early nodal metastasis.     3. No evidence of local recurrence in the prostatectomy bed.    The patient reviewed the  biopsy results with his urologist and he has kindly been referred today for discussion of potential radiation treatment options.   PREVIOUS RADIATION THERAPY: Yes as above  PAST MEDICAL HISTORY:  Past Medical History:  Diagnosis Date   Asthma    Cancer ([place])    prostate cancer    Diabetes mellitus without complication ([place])    History of bronchitis    Hypertension    Prostate cancer ([place])    Wears glasses       PAST SURGICAL HISTORY: Past Surgical History:  Procedure Laterality Date   EYE SURGERY     laser eye surgery bilat    left groin hernia      LYMPHADENECTOMY Bilateral 09/02/2015   Procedure: BILATERAL LYMPHADENECTOMY;  Surgeon: Raynelle Bring, MD;  Location: [facility];  Service: Urology;  Laterality: Bilateral;   ROBOT ASSISTED LAPAROSCOPIC RADICAL PROSTATECTOMY N/A 09/02/2015   Procedure: XI ROBOTIC ASSISTED LAPAROSCOPIC RADICAL PROSTATECTOMY LEVEL 2;  Surgeon: Raynelle Bring, MD;  Location: [facility];  Service: Urology;  Laterality: N/A;   schwannoma     removed approx 16 to 18 years ago    TONSILLECTOMY      FAMILY HISTORY: No family history on file.  SOCIAL HISTORY:  Social History   Socioeconomic History   Marital status: Married    Spouse name: Not on file   Number of children: Not on file   Years of education: Not on file   Highest education level: Not on file  Occupational History   Not on file  Tobacco Use   Smoking status: Former  Packs/day: 0.50    Years: 7.00    Pack years: 3.50    Types: Cigarettes    Quit date: 06/15/1998    Years since quitting: 23.1   Smokeless tobacco: Never  Vaping Use   Vaping Use: Never used  Substance and Sexual Activity   Alcohol use: Yes    Comment: beer daily Monday through Friday 5 beers through the weekends   Drug use: No   Sexual activity: Not on file  Other Topics Concern   Not on file  Social History Narrative   Not on file   Social Determinants of Health   Financial Resource Strain: Not on file  Food  Insecurity: Not on file  Transportation Needs: Not on file  Physical Activity: Not on file  Stress: Not on file  Social Connections: Not on file  Intimate Partner Violence: Not on file    ALLERGIES: Patient has no known allergies.  MEDICATIONS:  Current Outpatient Medications  Medication Sig Dispense Refill   amLODipine (NORVASC) 2.5 MG tablet      insulin lispro (HUMALOG) 100 UNIT/ML injection Inject into the skin.     NOVOLOG FLEXPEN 100 UNIT/ML FlexPen Inject 4-8 Units into the skin 3 (three) times daily. Sliding Scale     PROAIR HFA 108 (90 Base) MCG/ACT inhaler Inhale 1 puff into the lungs every 6 (six) hours as needed for wheezing or shortness of breath.      ramipril (ALTACE) 10 MG capsule Take 10 mg by mouth daily.     TRESIBA FLEXTOUCH 200 UNIT/ML SOPN Inject 36 Units into the skin every morning.      No current facility-administered medications for this visit.    REVIEW OF SYSTEMS:  On review of systems, the patient reports that he is doing well overall. He denies any chest pain, shortness of breath, cough, fevers, chills, night sweats, unintended weight changes. He denies any bowel disturbances, and denies abdominal pain, nausea or vomiting. He denies any new musculoskeletal or joint aches or pains. A complete review of systems is obtained and is otherwise negative.   PHYSICAL EXAM:  Wt Readings from Last 3 Encounters:  06/29/18 214 lb 12.8 oz (97.4 kg)  02/21/18 210 lb 3.2 oz (95.3 kg)  09/02/15 204 lb 9.4 oz (92.8 kg)   Temp Readings from Last 3 Encounters:  06/29/18 98.1 F (36.7 C) (Oral)  02/21/18 98.6 F (37 C) (Oral)  09/03/15 98.6 F (37 C) (Oral)   BP Readings from Last 3 Encounters:  06/29/18 (!) 142/84  02/21/18 (!) 168/94  09/03/15 131/64   Pulse Readings from Last 3 Encounters:  06/29/18 (!) 106  02/21/18 (!) 101  09/03/15 95    /10  In general this is a well appearing gentleman in no acute distress. He's alert and oriented x4 and appropriate  throughout the examination. Cardiopulmonary assessment is negative for acute distress, and he exhibits normal effort.    KPS = 100  100 - Normal; no complaints; no evidence of disease. 90   - Able to carry on normal activity; minor signs or symptoms of disease. 80   - Normal activity with effort; some signs or symptoms of disease. 71   - Cares for self; unable to carry on normal activity or to do active work. 60   - Requires occasional assistance, but is able to care for most of his personal needs. 50   - Requires considerable assistance and frequent medical care. 50   - Disabled; requires special care and  assistance. 41   - Severely disabled; hospital admission is indicated although death not imminent. 45   - Very sick; hospital admission necessary; active supportive treatment necessary. 10   - Moribund; fatal processes progressing rapidly. 0     - Dead  Karnofsky DA, Abelmann [place], Craver LS and Burchenal [facility] [phone]) The use of the nitrogen mustards in the palliative treatment of carcinoma: with particular reference to bronchogenic carcinoma Cancer 1 634-56  LABORATORY DATA:  Lab Results  Component Value Date   WBC 4.9 08/22/2015   HGB 12.9 (L) 09/03/2015   HCT 37.6 (L) 09/03/2015   MCV 86.1 08/22/2015   PLT 306 08/22/2015   Lab Results  Component Value Date   NA 138 08/22/2015   K 4.3 08/22/2015   CL 104 08/22/2015   CO2 25 08/22/2015   No results found for: ALT, AST, GGT, ALKPHOS, BILITOT   RADIOGRAPHY: No results found.    IMPRESSION/PLAN: 1.  56 y.o. gentleman with oligometastatic recurrence of prostate cancer to the right posterior 8th rib, prescacral lymph node and paraaortic lymph nodes.  Today, I talked to the patient and family about the findings and work-up thus far.  We discussed the natural history of oligometastatic prostate cancer and general treatment, highlighting the role of stereotactic radiotherapy in the management.  We discussed the available radiation  techniques, and focused on the details of logistics and delivery.  We reviewed the anticipated acute and late sequelae associated with radiation in this setting.  The patient was encouraged to ask questions that I answered to the best of my ability.   The patient would like to proceed with radiation and will be scheduled for CT simulation.  At the conclusion of our conversation, the patient is interested in moving forward with SBRT to the right 8th rib and Ultrahypofractionated Radiotherapy [facility]) to the paraaortic and presacral lymph nodes.  We personally spent 60 minutes in this encounter including chart review, reviewing radiological studies, meeting face-to-face with the patient, entering orders and completing documentation.      Tyler Pita, MD  [facility]   Radiation Oncology Direct Dial: [phone]   Fax: [phone] [email or web address]   Skype   LinkedIn

## 2021-08-17 NOTE — Progress Notes (Signed)
?  Radiation Oncology         [phone] ?________________________________ ? ?Name: [name] MRN: [id number]  ?Date: 08/18/2021  DOB: [birth year] ? ?STEREOTACTIC BODY RADIOTHERAPY ?SIMULATION AND TREATMENT PLANNING NOTE ? ?  ICD-10-CM   ?1. Prostate cancer ([place])  C61   ?  ? ? ?DIAGNOSIS:  56 y.o. gentleman with oligometastatic recurrence of prostate cancer to the right posterior 8th rib, prescacral lymph node and paraaortic lymph nodes. ? ?NARRATIVE:  The patient was brought to the [place].  Identity was confirmed.  All relevant records and images related to the planned course of therapy were reviewed.  The patient freely provided informed written consent to proceed with treatment after reviewing the details related to the planned course of therapy. The consent form was witnessed and verified by the simulation staff.  Then, the patient was set-up in a stable reproducible  supine position for radiation therapy.  A BodyFix immobilization pillow was fabricated for reproducible positioning.  Surface markings were placed.  The CT images were loaded into the planning software.  The gross target volumes (GTV) and planning target volumes (PTV) were delinieated, and avoidance structures were contoured.  Treatment planning then occurred.  The radiation prescription was entered and confirmed.  A total of two complex treatment devices were fabricated in the form of the BodyFix immobilization pillow and a neck accuform cushion.  I have requested : 3D Simulation  I have requested a DVH of the following structures: targets and all normal structures near the target including lungs, spinal cord, heart, liver, kidneys and spinal cord as noted on the radiation plan to maintain doses in adherence with established limits ? ?SPECIAL TREATMENT PROCEDURE:  The planned course of therapy using radiation constitutes a special treatment procedure. Special care is required in the management of this patient for the  following reasons. High dose per fraction requiring special monitoring for increased toxicities of treatment including daily imaging..  The special nature of the planned course of radiotherapy will require increased physician supervision and oversight to ensure patient's safety with optimal treatment outcomes.    This requires extended time and effort.   ? ?PLAN:  The patient will receive 50 Gy in 5 fractions to the right 8th rib, and 50 Gy in 10 fractions to the paraaortic/pre-sacral adenopathy, while delivering 30 Gy in 10 fractions to the involved echelons. ? ?________________________________ ? ?Sheral Apley Tammi Klippel, M.D. ? ?

## 2021-08-18 ENCOUNTER — Other Ambulatory Visit: Payer: Self-pay

## 2021-08-18 ENCOUNTER — Ambulatory Visit
Admission: RE | Admit: 2021-08-18 | Discharge: 2021-08-18 | Disposition: A | Discharge status: Home / Self Care | Payer: BC Managed Care – PPO | Source: Ambulatory Visit | Attending: Radiation Oncology | Admitting: Radiation Oncology

## 2021-08-18 DIAGNOSIS — Z510 Encounter for antineoplastic radiation therapy: Secondary | ICD-10-CM | POA: Diagnosis present

## 2021-08-18 DIAGNOSIS — C61 Malignant neoplasm of prostate: Secondary | ICD-10-CM | POA: Diagnosis present

## 2021-08-25 DIAGNOSIS — C61 Malignant neoplasm of prostate: Secondary | ICD-10-CM | POA: Diagnosis not present

## 2021-08-27 ENCOUNTER — Ambulatory Visit
Admission: RE | Admit: 2021-08-27 | Discharge: 2021-08-27 | Disposition: A | Discharge status: Home / Self Care | Payer: BC Managed Care – PPO | Source: Ambulatory Visit | Attending: Radiation Oncology | Admitting: Radiation Oncology

## 2021-08-27 DIAGNOSIS — C61 Malignant neoplasm of prostate: Secondary | ICD-10-CM

## 2021-08-28 ENCOUNTER — Ambulatory Visit
Admission: RE | Admit: 2021-08-28 | Discharge: 2021-08-28 | Disposition: A | Discharge status: Home / Self Care | Payer: BC Managed Care – PPO | Source: Ambulatory Visit | Attending: Radiation Oncology | Admitting: Radiation Oncology

## 2021-08-28 ENCOUNTER — Other Ambulatory Visit: Payer: Self-pay

## 2021-08-28 DIAGNOSIS — C61 Malignant neoplasm of prostate: Secondary | ICD-10-CM | POA: Diagnosis not present

## 2021-08-29 ENCOUNTER — Ambulatory Visit
Admission: RE | Admit: 2021-08-29 | Discharge: 2021-08-29 | Disposition: A | Discharge status: Home / Self Care | Payer: BC Managed Care – PPO | Source: Ambulatory Visit | Attending: Radiation Oncology | Admitting: Radiation Oncology

## 2021-08-29 DIAGNOSIS — C61 Malignant neoplasm of prostate: Secondary | ICD-10-CM | POA: Diagnosis not present

## 2021-09-01 ENCOUNTER — Ambulatory Visit
Admission: RE | Admit: 2021-09-01 | Discharge: 2021-09-01 | Disposition: A | Discharge status: Home / Self Care | Payer: BC Managed Care – PPO | Source: Ambulatory Visit | Attending: Radiation Oncology | Admitting: Radiation Oncology

## 2021-09-01 ENCOUNTER — Other Ambulatory Visit: Payer: Self-pay

## 2021-09-01 DIAGNOSIS — C61 Malignant neoplasm of prostate: Secondary | ICD-10-CM

## 2021-09-02 ENCOUNTER — Ambulatory Visit
Admission: RE | Admit: 2021-09-02 | Discharge: 2021-09-02 | Disposition: A | Discharge status: Home / Self Care | Payer: BC Managed Care – PPO | Source: Ambulatory Visit | Attending: Radiation Oncology | Admitting: Radiation Oncology

## 2021-09-02 DIAGNOSIS — C61 Malignant neoplasm of prostate: Secondary | ICD-10-CM | POA: Diagnosis not present

## 2021-09-03 ENCOUNTER — Other Ambulatory Visit: Payer: Self-pay

## 2021-09-03 ENCOUNTER — Ambulatory Visit
Admission: RE | Admit: 2021-09-03 | Discharge: 2021-09-03 | Disposition: A | Discharge status: Home / Self Care | Payer: BC Managed Care – PPO | Source: Ambulatory Visit | Attending: Radiation Oncology | Admitting: Radiation Oncology

## 2021-09-03 DIAGNOSIS — C61 Malignant neoplasm of prostate: Secondary | ICD-10-CM | POA: Diagnosis not present

## 2021-09-04 ENCOUNTER — Ambulatory Visit
Admission: RE | Admit: 2021-09-04 | Discharge: 2021-09-04 | Disposition: A | Discharge status: Home / Self Care | Payer: BC Managed Care – PPO | Source: Ambulatory Visit | Attending: Radiation Oncology | Admitting: Radiation Oncology

## 2021-09-04 DIAGNOSIS — C61 Malignant neoplasm of prostate: Secondary | ICD-10-CM | POA: Diagnosis not present

## 2021-09-05 ENCOUNTER — Ambulatory Visit
Admission: RE | Admit: 2021-09-05 | Discharge: 2021-09-05 | Disposition: A | Discharge status: Home / Self Care | Payer: BC Managed Care – PPO | Source: Ambulatory Visit | Attending: Radiation Oncology | Admitting: Radiation Oncology

## 2021-09-05 ENCOUNTER — Other Ambulatory Visit: Payer: Self-pay

## 2021-09-05 ENCOUNTER — Encounter: Payer: Self-pay | Admitting: Urology

## 2021-09-05 DIAGNOSIS — C61 Malignant neoplasm of prostate: Secondary | ICD-10-CM

## 2021-09-05 DIAGNOSIS — C778 Secondary and unspecified malignant neoplasm of lymph nodes of multiple regions: Secondary | ICD-10-CM

## 2021-09-08 ENCOUNTER — Ambulatory Visit
Admission: RE | Admit: 2021-09-08 | Discharge: 2021-09-08 | Disposition: A | Discharge status: Home / Self Care | Payer: BC Managed Care – PPO | Source: Ambulatory Visit | Attending: Radiation Oncology | Admitting: Radiation Oncology

## 2021-09-08 DIAGNOSIS — C61 Malignant neoplasm of prostate: Secondary | ICD-10-CM | POA: Diagnosis not present

## 2021-09-09 ENCOUNTER — Other Ambulatory Visit: Payer: Self-pay

## 2021-09-09 ENCOUNTER — Ambulatory Visit
Admission: RE | Admit: 2021-09-09 | Discharge: 2021-09-09 | Disposition: A | Discharge status: Home / Self Care | Payer: BC Managed Care – PPO | Source: Ambulatory Visit | Attending: Radiation Oncology | Admitting: Radiation Oncology

## 2021-09-09 DIAGNOSIS — C61 Malignant neoplasm of prostate: Secondary | ICD-10-CM | POA: Diagnosis not present

## 2021-10-14 ENCOUNTER — Telehealth: Payer: Self-pay

## 2021-10-14 NOTE — Telephone Encounter (Signed)
Telephone appointment reminder. I left a voicemail reminding patient of their 9:00am-10/15/21 telephone appointment w/ Ashlyn Bruning PA-C. I left my extension [phone] and requested that patient return my call, so that I may complete the nursing portion of this appointment. ?

## 2021-10-15 ENCOUNTER — Ambulatory Visit
Admission: RE | Admit: 2021-10-15 | Discharge: 2021-10-15 | Disposition: A | Discharge status: Home / Self Care | Payer: BC Managed Care – PPO | Source: Ambulatory Visit | Attending: Urology | Admitting: Urology

## 2021-10-15 ENCOUNTER — Encounter: Payer: Self-pay | Admitting: Urology

## 2021-10-15 ENCOUNTER — Telehealth: Payer: Self-pay | Admitting: *Deleted

## 2021-10-15 DIAGNOSIS — C778 Secondary and unspecified malignant neoplasm of lymph nodes of multiple regions: Secondary | ICD-10-CM | POA: Insufficient documentation

## 2021-10-15 DIAGNOSIS — C61 Malignant neoplasm of prostate: Secondary | ICD-10-CM

## 2021-10-15 NOTE — Progress Notes (Incomplete)
?Radiation Oncology         [phone] ?________________________________ ? ?Name: [name] MRN: [id number]  ?Date: 10/15/2021  DOB: 1965/12/04 ? ?Post Treatment Note ? ?CC: [name], MD  [name], MD ? ?Diagnosis:   56 y.o. gentleman with oligometastatic recurrence of prostate cancer to the right posterior 8th rib, prescacral lymph node and paraaortic lymph nodes.    ? ?Interval Since Last Radiation:  5 weeks  ?08/27/21 - 09/09/21:  The patient received 50 Gy in 5 fractions to the right 8th rib, and 50 Gy in 10 fractions to the paraaortic/pre-sacral adenopathy, while delivering 30 Gy in 10 fractions to the involved nodal echelons. ? ?03/31/2018 - 05/24/2018: The patient received 45 Gy in 25 fractions of 1.8 Gy, followed by a boost to the prostate bed to a total dose of 68.4 Gy with 13 additional fractions of 1.8 Gy. ? ?Narrative:  I spoke with the patient to conduct his routine scheduled 1 month follow up visit via telephone to spare the patient unnecessary potential exposure in the healthcare setting during the current COVID-19 pandemic.  The patient was notified in advance and gave permission to proceed with this visit format. ? ?He tolerated radiation treatment relatively well without ill side effects                             ? ?On review of systems, the patient states *** ? ?ALLERGIES:  has No Known Allergies. ? ?Meds: ?Current Outpatient Medications  ?Medication Sig Dispense Refill  ? amLODipine (NORVASC) 2.5 MG tablet     ? amLODipine (NORVASC) 5 MG tablet Take 5 mg by mouth daily.    ? Continuous Blood Gluc Sensor (FREESTYLE LIBRE 2 SENSOR) MISC USE TO MONITOR CBG EVERY 14 DAYS AS DIRECTED    ? insulin lispro (HUMALOG) 100 UNIT/ML injection Inject into the skin.    ? Insulin Pen Needle (B-D UF III MINI PEN NEEDLES) 31G X 5 MM MISC USE TO INJECT INSULIN 4 TIMES DAILY E10.9    ? Insulin Syringe-Needle U-100 (B-D INS SYRINGE 0.5CC/31GX5/16) 31G X 5/16" 0.5 ML MISC use 1 syringe as directed  (does 5 injections a day    ? NOVOLOG FLEXPEN 100 UNIT/ML FlexPen Inject 4-8 Units into the skin 3 (three) times daily. Sliding Scale    ? PROAIR HFA 108 (90 Base) MCG/ACT inhaler Inhale 1 puff into the lungs every 6 (six) hours as needed for wheezing or shortness of breath.     ? QUICKVUE AT-HOME COVID-19 TEST KIT See admin instructions.    ? ramipril (ALTACE) 10 MG capsule Take 10 mg by mouth daily.    ? rosuvastatin (CRESTOR) 5 MG tablet TAKE 1 TABLET BY MOUTH TWICE A WEEK    ? TRESIBA FLEXTOUCH 200 UNIT/ML SOPN Inject 36 Units into the skin every morning.     ? ?No current facility-administered medications for this visit.  ? ? ?Physical Findings: ? vitals were not taken for this visit.  ? /10 ?Unable to assess due to telephone follow-up visit format. ? ?Lab Findings: ?Lab Results  ?Component Value Date  ? WBC 4.9 08/22/2015  ? HGB 12.9 (L) 09/03/2015  ? HCT 37.6 (L) 09/03/2015  ? MCV 86.1 08/22/2015  ? PLT 306 08/22/2015  ? ? ? ?Radiographic Findings: ?No results found. ? ?Impression/Plan: ?1. 56 y.o. gentleman with oligometastatic recurrence of prostate cancer to the right posterior 8th rib, prescacral lymph node and paraaortic  lymph nodes.    ?The patient appears to have recovered well from the effects of his recent stereotactic body radiotherapy and is currently without complaints.  We discussed that while we are happy to continue to participate in his care if clinically indicated, at this point, we will plan to see him back on an as-needed basis.  He will continue in routine follow-up under the care and direction of Dr. Alinda Money for continued monitoring of his PSA and management of his systemic disease.  He appears to have a good understanding of these recommendations and is comfortable and in agreement with the stated plan.  He knows that he is welcome to call at anytime with any questions or concerns related to his previous radiotherapy. ? ? ? ?Nicholos Johns, PA-C  ?

## 2021-10-15 NOTE — Telephone Encounter (Signed)
CALLED PATIENT TO RESCHEDULE TELEPHONE FU, PATIENT AGREED TO BE CALLED ON 10/30/21 @ 8:30 AM BY ASHLYN BRUNING ?

## 2021-10-15 NOTE — Progress Notes (Signed)
Unable to reach patient in reference to today's 10/15/21 telephone appointment w/ Ashlyn Bruning PA-C. Multiple voicemail's left for patient. ?

## 2021-10-15 NOTE — Progress Notes (Signed)
?  Radiation Oncology         [phone] ?________________________________ ? ?Name: [name] MRN: [id number]  ?Date: 09/05/2021  DOB: 1965-07-15 ? ?End of Treatment Note ? ?Diagnosis:   56 y.o. gentleman with oligometastatic recurrence of prostate cancer to the right posterior 8th rib, prescacral lymph node and paraaortic lymph nodes.    ? ?Indication for treatment:  Curative, Definitive SBRT      ? ?Radiation treatment dates:   08/27/21 - 09/09/21 ? ?Site/dose:   The patient received 50 Gy in 5 fractions to the right 8th rib, and 50 Gy in 10 fractions to the paraaortic/pre-sacral adenopathy, while delivering 30 Gy in 10 fractions to the involved nodal echelons. ? ?Beams/energy:   The patient was treated using stereotactic body radiotherapy according to a 3D conformal radiotherapy plan.  Volumetric arc fields were employed to deliver 6 MV X-rays.  Image guidance was performed with per fraction cone beam CT prior to treatment under personal MD supervision.  Immobilization was achieved using BodyFix Pillow. ? ?Narrative: The patient tolerated radiation treatment relatively well without ill side effects. ? ?Plan: The patient has completed radiation treatment. The patient will return to radiation oncology clinic for routine followup in one month. I advised them to call or return sooner if they have any questions or concerns related to their recovery or treatment. ?________________________________ ? ?Sheral Apley Tammi Klippel, M.D. ? ?  ?

## 2021-10-29 ENCOUNTER — Encounter: Payer: Self-pay | Admitting: Urology

## 2021-10-29 NOTE — Progress Notes (Signed)
Telephone appointment. I verified patient identity and began nursing interview. Patient reports doing well. No issues reported at this time. ? ?Meaningful use complete. ?I-PSS score of 3 (mild). ?No urinary management medications. ?Urology appointment- August, 2023 -per patient ? ?Patient notified of his 8:30am-10/30/21 telephone appointment w/ Ashlyn Bruning PA-C. I left my extension [phone] in case patient needs anything. ?Patient verbalized understanding of information. ? ?Patient preferred contact [phone] ?

## 2021-10-30 ENCOUNTER — Ambulatory Visit
Admission: RE | Admit: 2021-10-30 | Discharge: 2021-10-30 | Disposition: A | Discharge status: Home / Self Care | Payer: BC Managed Care – PPO | Source: Ambulatory Visit | Attending: Urology | Admitting: Urology

## 2021-10-30 DIAGNOSIS — C61 Malignant neoplasm of prostate: Secondary | ICD-10-CM

## 2021-10-30 NOTE — Progress Notes (Signed)
Radiation Oncology         [phone] ________________________________  Name: [name] MRN: [id number]  Date: 10/30/2021  DOB: 21-Mar-1966  Post Treatment Note  CC: [name], MD  [name], MD  Diagnosis:   56 y.o. gentleman with oligometastatic recurrence of prostate cancer to the right posterior 8th rib, prescacral lymph node and paraaortic lymph nodes.     Interval Since Last Radiation:  7 weeks  08/27/21 - 09/09/21:  The patient received 50 Gy in 5 fractions SBRT to the right 8th rib, and 50 Gy in 10 fractions UHFR to the paraaortic/pre-sacral adenopathy, while delivering 30 Gy in 10 fractions to the involved nodal echelons.  03/31/2018 - 05/24/2018: The patient received 45 Gy in 25 fractions of 1.8 Gy, followed by a boost to the prostate bed to a total dose of 68.4 Gy with 13 additional fractions of 1.8 Gy.  Narrative:  I spoke with the patient to conduct his routine scheduled 1 month follow up visit via telephone to spare the patient unnecessary potential exposure in the healthcare setting during the current COVID-19 pandemic.  The patient was notified in advance and gave permission to proceed with this visit format.  He tolerated radiation treatment relatively well without ill side effects                              On review of systems, the patient states that he is doing very general.  He had a follow-up visit with Dr. Alinda Money earlier this month and his PSA, drawn prior to that visit on 10/07/2021, had increased from 5.57 in December 2022 up to 9.74.  Therefore, he was started on Xtandi and given a 42-month Eligard injection on 10/22/2021.  He feels like he is tolerating the ADT fairly well despite some decreased stamina, decreased libido and occasional hot flashes.  He specifically denies dysuria, excessive daytime frequency, urgency, straining to void, incomplete bladder emptying or incontinence.  He has had some intermittent gross hematuria with passage of very small,  dark clots.  He reports a healthy appetite and is maintaining his weight.  He denies abdominal pain, nausea, vomiting, diarrhea or constipation.  He is quite anxious and interested in having a repeat PSMA PET scan to assess his treatment response and/or whether there is any additional metastatic disease that may be driving the PSA increase.  His next scheduled follow-up visit with Dr. Alinda Money is 01/13/2022.  ALLERGIES:  has No Known Allergies.  Meds: Current Outpatient Medications  Medication Sig Dispense Refill   amLODipine (NORVASC) 2.5 MG tablet      amLODipine (NORVASC) 5 MG tablet Take 5 mg by mouth daily.     Continuous Blood Gluc Sensor (FREESTYLE LIBRE 2 SENSOR) MISC USE TO MONITOR CBG EVERY 14 DAYS AS DIRECTED     insulin lispro (HUMALOG) 100 UNIT/ML injection Inject into the skin.     Insulin Pen Needle (B-D UF III MINI PEN NEEDLES) 31G X 5 MM MISC USE TO INJECT INSULIN 4 TIMES DAILY E10.9     Insulin Syringe-Needle U-100 (B-D INS SYRINGE 0.5CC/31GX5/16) 31G X 5/16" 0.5 ML MISC use 1 syringe as directed (does 5 injections a day     NOVOLOG FLEXPEN 100 UNIT/ML FlexPen Inject 4-8 Units into the skin 3 (three) times daily. Sliding Scale     PROAIR HFA 108 (90 Base) MCG/ACT inhaler Inhale 1 puff into the lungs every 6 (six) hours as needed  for wheezing or shortness of breath.      QUICKVUE AT-HOME COVID-19 TEST KIT See admin instructions.     ramipril (ALTACE) 10 MG capsule Take 10 mg by mouth daily.     rosuvastatin (CRESTOR) 5 MG tablet TAKE 1 TABLET BY MOUTH TWICE A WEEK     TRESIBA FLEXTOUCH 200 UNIT/ML SOPN Inject 36 Units into the skin every morning.      No current facility-administered medications for this encounter.    Physical Findings:  vitals were not taken for this visit.  Pain Assessment Pain Score: 0-No pain/10 Unable to assess due to telephone follow-up visit format.  Lab Findings: Lab Results  Component Value Date   WBC 4.9 08/22/2015   HGB 12.9 (L) 09/03/2015    HCT 37.6 (L) 09/03/2015   MCV 86.1 08/22/2015   PLT 306 08/22/2015     Radiographic Findings: No results found.  Impression/Plan: 1. 56 y.o. gentleman with oligometastatic recurrence of prostate cancer to the right posterior 8th rib, prescacral lymph node and paraaortic lymph nodes.    The patient appears to have recovered well from the effects of his recent stereotactic body radiotherapy (SBRT)/ultra-hypo-fractionated radiotherapy [facility]) and is currently without complaints aside from intermittent gross hematuria that resolves spontaneously with increased fluid intake.  He was recently started on Xtandi and given a 54-month Eligard injection on 10/22/2021.  He feels like he is tolerating the ADT fairly well despite some decreased stamina, decreased libido and occasional hot flashes.  He is quite anxious and interested in having a repeat PSMA PET scan to assess his treatment response and/or whether there is any additional metastatic disease that may be driving the PSA increase.  His next scheduled follow-up visit with Dr. Alinda Money is 01/13/2022 and he will discuss this further at that time. We discussed that while we are happy to continue to participate in his care if clinically indicated, at this point, we will plan to see him back on an as-needed basis.  He will continue in routine follow-up under the care and direction of Dr. Alinda Money for continued monitoring of his PSA and management of his systemic disease.  He appears to have a good understanding of these recommendations and is comfortable and in agreement with the stated plan.  He knows that he is welcome to call at anytime with any questions or concerns related to his previous radiotherapy.    Nicholos Johns, PA-C

## 2022-03-01 ENCOUNTER — Encounter (HOSPITAL_COMMUNITY): Payer: Self-pay

## 2022-03-01 ENCOUNTER — Other Ambulatory Visit: Payer: Self-pay

## 2022-03-01 ENCOUNTER — Emergency Department (HOSPITAL_COMMUNITY)
Admission: EM | Admit: 2022-03-01 | Discharge: 2022-03-02 | Disposition: A | Discharge status: Home / Self Care | Payer: BC Managed Care – PPO | Attending: Emergency Medicine | Admitting: Emergency Medicine

## 2022-03-01 DIAGNOSIS — R319 Hematuria, unspecified: Secondary | ICD-10-CM | POA: Insufficient documentation

## 2022-03-01 DIAGNOSIS — Z794 Long term (current) use of insulin: Secondary | ICD-10-CM | POA: Insufficient documentation

## 2022-03-01 DIAGNOSIS — R339 Retention of urine, unspecified: Secondary | ICD-10-CM | POA: Insufficient documentation

## 2022-03-01 DIAGNOSIS — Z8546 Personal history of malignant neoplasm of prostate: Secondary | ICD-10-CM | POA: Diagnosis not present

## 2022-03-01 LAB — URINALYSIS, ROUTINE W REFLEX MICROSCOPIC
Bacteria, UA: NONE SEEN
Bilirubin Urine: NEGATIVE
Glucose, UA: 50 mg/dL — AB
Ketones, ur: NEGATIVE mg/dL
Leukocytes,Ua: NEGATIVE
Nitrite: NEGATIVE
Protein, ur: 100 mg/dL — AB
RBC / HPF: >50 RBC/hpf — ABNORMAL HIGH (ref 0–5)
Specific Gravity, Urine: 1.013 (ref 1.005–1.030)
pH: 6 (ref 5.0–8.0)

## 2022-03-01 MED ORDER — CEPHALEXIN 500 MG PO CAPS
500.0000 mg | ORAL_CAPSULE | Freq: Once | ORAL | Status: AC
  Administered 2022-03-01: 500 mg via ORAL
  Filled 2022-03-01: qty 1

## 2022-03-01 MED ORDER — CEPHALEXIN 500 MG PO CAPS: 500.0000 mg | ORAL_CAPSULE | Freq: Four times a day (QID) | ORAL | 0 refills | Status: DC

## 2022-03-01 NOTE — ED Triage Notes (Signed)
Ambulatory to ED with c/o urinary retention. States he hasnt urinated in 5 hours. Currently being tx for prostate cancer.

## 2022-03-01 NOTE — ED Provider Notes (Signed)
[place] DEPT Provider Note   CSN: [id number] Arrival date & time: 03/01/22  2118     History  Chief Complaint  Patient presents with   Urinary Retention    [name] is a 56 y.o. male.  Patient has a history of prostate cancer.  He states that he had been able to pee for 5 to 6 hours and he is having blood come out of his penis.  The history is provided by the patient and medical records. No language interpreter was used.  Hematuria This is a new problem. The current episode started 6 to 12 hours ago. The problem occurs constantly. The problem has not changed since onset.Pertinent negatives include no chest pain, no abdominal pain and no headaches. Nothing aggravates the symptoms. Nothing relieves the symptoms. He has tried nothing for the symptoms.       Home Medications Prior to Admission medications   Medication Sig Start Date End Date Taking? Authorizing Provider  cephALEXin (KEFLEX) 500 MG capsule Take 1 capsule (500 mg total) by mouth 4 (four) times daily. 03/01/22  Yes Milton Ferguson, MD  amLODipine (NORVASC) 2.5 MG tablet  12/29/17   [provider]  amLODipine (NORVASC) 5 MG tablet Take 5 mg by mouth daily. 06/23/21   [provider]  Continuous Blood Gluc Sensor (FREESTYLE LIBRE 2 SENSOR) MISC USE TO MONITOR CBG EVERY 14 DAYS AS DIRECTED 06/11/21   [provider]  insulin lispro (HUMALOG) 100 UNIT/ML injection Inject into the skin.    [provider]  Insulin Pen Needle (B-D UF III MINI PEN NEEDLES) 31G X 5 MM MISC USE TO INJECT INSULIN 4 TIMES DAILY E10.9 01/22/16   [provider]  Insulin Syringe-Needle U-100 (B-D INS SYRINGE 0.5CC/31GX5/16) 31G X 5/16" 0.5 ML MISC use 1 syringe as directed (does 5 injections a day 02/04/10   [provider]  NOVOLOG FLEXPEN 100 UNIT/ML FlexPen Inject 4-8 Units into the skin 3 (three) times daily. Sliding Scale 07/25/15   [provider]   PROAIR HFA 108 (90 Base) MCG/ACT inhaler Inhale 1 puff into the lungs every 6 (six) hours as needed for wheezing or shortness of breath.  07/26/15   [provider]  QUICKVUE AT-HOME COVID-19 TEST KIT See admin instructions. 06/09/21   [provider]  ramipril (ALTACE) 10 MG capsule Take 10 mg by mouth daily.    [provider]  rosuvastatin (CRESTOR) 5 MG tablet TAKE 1 TABLET BY MOUTH TWICE A WEEK    [provider]  TRESIBA FLEXTOUCH 200 UNIT/ML SOPN Inject 36 Units into the skin every morning.  07/25/15   [provider]      Allergies    Patient has no known allergies.    Review of Systems   Review of Systems  Constitutional:  Negative for appetite change and fatigue.  HENT:  Negative for congestion, ear discharge and sinus pressure.   Eyes:  Negative for discharge.  Respiratory:  Negative for cough.   Cardiovascular:  Negative for chest pain.  Gastrointestinal:  Negative for abdominal pain and diarrhea.  Genitourinary:  Positive for hematuria. Negative for frequency.  Musculoskeletal:  Negative for back pain.  Skin:  Negative for rash.  Neurological:  Negative for seizures and headaches.  Psychiatric/Behavioral:  Negative for hallucinations.     Physical Exam Updated Vital Signs BP (!) 163/88 (BP Location: Left Arm)   Pulse 94   Temp 98.2 F (36.8 C) (Oral)   Resp  20   Ht _0  (1.778 m)   Wt 98.5 kg   SpO2 100%   BMI 31.16 kg/m  Physical Exam Vitals and nursing note reviewed.  Constitutional:      Appearance: He is well-developed.  HENT:     Head: Normocephalic.     Nose: Nose normal.  Eyes:     General: No scleral icterus.    Conjunctiva/sclera: Conjunctivae normal.  Neck:     Thyroid: No thyromegaly.  Cardiovascular:     Rate and Rhythm: Normal rate and regular rhythm.     Heart sounds: No murmur heard.    No friction rub. No gallop.  Pulmonary:     Breath sounds: No stridor. No wheezing or rales.  Chest:      Chest wall: No tenderness.  Abdominal:     General: There is no distension.     Tenderness: There is no abdominal tenderness. There is no rebound.  Genitourinary:    Comments: Blood from penis Musculoskeletal:        General: Normal range of motion.     Cervical back: Neck supple.  Lymphadenopathy:     Cervical: No cervical adenopathy.  Skin:    Findings: No erythema or rash.  Neurological:     Mental Status: He is alert and oriented to person, place, and time.     Motor: No abnormal muscle tone.     Coordination: Coordination normal.  Psychiatric:        Behavior: Behavior normal.     ED Results / Procedures / Treatments   Labs (all labs ordered are listed, but only abnormal results are displayed) Labs Reviewed  URINALYSIS, ROUTINE W REFLEX MICROSCOPIC - Abnormal; Notable for the following components:      Result Value   Color, Urine RED (*)    APPearance CLOUDY (*)    Glucose, UA 50 (*)    Hgb urine dipstick MODERATE (*)    Protein, ur 100 (*)    RBC / HPF >50 (*)    All other components within normal limits  URINE CULTURE    EKG None  Radiology No results found.  Procedures Procedures    Medications Ordered in ED Medications  cephALEXin (KEFLEX) capsule 500 mg (has no administration in time range)    ED Course/ Medical Decision Making/ A&P  Patient had a Foley catheter placed with relieved his obstruction                         Medical Decision Making Amount and/or Complexity of Data Reviewed Labs: ordered.  Risk Prescription drug management.  This patient presents to the ED for concern of hematuria, this involves an extensive number of treatment options, and is a complaint that carries with it a high risk of complications and morbidity.  The differential diagnosis includes UTI, bleeding from prostate cancer   Co morbidities that complicate the patient evaluation  Prostate cancer   Additional history obtained:  Additional history obtained  from family External records from outside source obtained and reviewed including hospital records   Lab Tests:  I Ordered, and personally interpreted labs.  The pertinent results include: Urinalysis shows greater than 50 RBCs   Imaging Studies ordered:  No imaging  Cardiac Monitoring: / EKG:  The patient was maintained on a cardiac monitor.  I personally viewed and interpreted the cardiac monitored which showed an underlying rhythm of: Normal sinus rhythm   Consultations Obtained: No consultant  Problem  List / ED Course / Critical interventions / Medication management  Urinary retention and  hematuria and prostate cancer I ordered medication including Keflex for possible UTI Reevaluation of the patient after these medicines showed that the patient stayed the same I have reviewed the patients home medicines and have made adjustments as needed   Social Determinants of Health:  None   Test / Admission - Considered:  Patient does not need admission  Patient with hematuria and urinary retention.  Patient improving with a Foley catheter.  He will have the urine cultured and started on Keflex and will fill see his urologist this week        Final Clinical Impression(s) / ED Diagnoses Final diagnoses:  Urinary retention    Rx / DC Orders ED Discharge Orders          Ordered    cephALEXin (KEFLEX) 500 MG capsule  4 times daily        03/01/22 2305              Milton Ferguson, MD 03/06/22 1211

## 2022-03-01 NOTE — ED Notes (Signed)
Pt provided leg bag and alcohol wipes. Pt educated on changing bags and cleaning the bags. Pt provided teach back and verbalized understanding.

## 2022-03-01 NOTE — Discharge Instructions (Signed)
Drink plenty of fluids.  Follow-up with your urologist this week.

## 2022-03-02 ENCOUNTER — Emergency Department (HOSPITAL_COMMUNITY)
Admission: EM | Admit: 2022-03-02 | Discharge: 2022-03-02 | Disposition: A | Discharge status: Home / Self Care | Payer: BC Managed Care – PPO | Source: Home / Self Care | Attending: Emergency Medicine | Admitting: Emergency Medicine

## 2022-03-02 ENCOUNTER — Encounter (HOSPITAL_COMMUNITY): Payer: Self-pay

## 2022-03-02 DIAGNOSIS — R339 Retention of urine, unspecified: Secondary | ICD-10-CM | POA: Insufficient documentation

## 2022-03-02 DIAGNOSIS — Z8546 Personal history of malignant neoplasm of prostate: Secondary | ICD-10-CM | POA: Insufficient documentation

## 2022-03-02 NOTE — ED Provider Notes (Signed)
[place] DEPT Provider Note   CSN: [id number] Arrival date & time: 03/02/22  0743     History  Chief Complaint  Patient presents with   Hematuria    [name] is a 56 y.o. male with medical history of prostate cancer.  Patient presents to the ED for evaluation of urinary retention.  The patient was seen last night in the ED for the same complaint.  Patient had Foley catheter placed, urine was cultured, patient was started on Keflex for suspected UTI, the patient was advised to follow-up with his urologist.  Patient reports last night he went home after being discharged from ED, noted that his Foley catheter was not passing urine.  The patient states that he then dislodged his Foley catheter to attempt to urinate on his own however was unsuccessful.  Patient now complaining of urinary retention, pressure and pain.  Patient requesting that we reinsert his Foley catheter. The patient denies any fevers, nausea or vomiting.   Hematuria       Home Medications Prior to Admission medications   Medication Sig Start Date End Date Taking? Authorizing Provider  amLODipine (NORVASC) 2.5 MG tablet  12/29/17   [provider]  amLODipine (NORVASC) 5 MG tablet Take 5 mg by mouth daily. 06/23/21   [provider]  cephALEXin (KEFLEX) 500 MG capsule Take 1 capsule (500 mg total) by mouth 4 (four) times daily. 03/01/22   Milton Ferguson, MD  Continuous Blood Gluc Sensor (FREESTYLE LIBRE 2 SENSOR) MISC USE TO MONITOR CBG EVERY 14 DAYS AS DIRECTED 06/11/21   [provider]  insulin lispro (HUMALOG) 100 UNIT/ML injection Inject into the skin.    [provider]  Insulin Pen Needle (B-D UF III MINI PEN NEEDLES) 31G X 5 MM MISC USE TO INJECT INSULIN 4 TIMES DAILY E10.9 01/22/16   [provider]  Insulin Syringe-Needle U-100 (B-D INS SYRINGE 0.5CC/31GX5/16) 31G X 5/16" 0.5 ML MISC use 1 syringe as directed (does 5 injections a day  02/04/10   [provider]  NOVOLOG FLEXPEN 100 UNIT/ML FlexPen Inject 4-8 Units into the skin 3 (three) times daily. Sliding Scale 07/25/15   [provider]  PROAIR HFA 108 (90 Base) MCG/ACT inhaler Inhale 1 puff into the lungs every 6 (six) hours as needed for wheezing or shortness of breath.  07/26/15   [provider]  QUICKVUE AT-HOME COVID-19 TEST KIT See admin instructions. 06/09/21   [provider]  ramipril (ALTACE) 10 MG capsule Take 10 mg by mouth daily.    [provider]  rosuvastatin (CRESTOR) 5 MG tablet TAKE 1 TABLET BY MOUTH TWICE A WEEK    [provider]  TRESIBA FLEXTOUCH 200 UNIT/ML SOPN Inject 36 Units into the skin every morning.  07/25/15   [provider]      Allergies    Patient has no known allergies.    Review of Systems   Review of Systems  Constitutional:  Negative for chills and fever.  Gastrointestinal:  Negative for nausea and vomiting.  Genitourinary:  Positive for difficulty urinating, dysuria and hematuria.    Physical Exam Updated Vital Signs BP (!) 171/87   Pulse 85   Temp 97.8 F (36.6 C) (Oral)   Resp 18   SpO2 97%  Physical Exam Vitals and nursing note reviewed.  Constitutional:      General: He is not in acute distress.    Appearance: He is not ill-appearing, toxic-appearing or diaphoretic.  HENT:     Head: Normocephalic and atraumatic.     Nose: Nose normal. No congestion.     Mouth/Throat:     Mouth: Mucous membranes are moist.     Pharynx: Oropharynx is clear.  Eyes:     Extraocular Movements: Extraocular movements intact.     Conjunctiva/sclera: Conjunctivae normal.     Pupils: Pupils are equal, round, and reactive to light.  Cardiovascular:     Rate and Rhythm: Normal rate and regular rhythm.  Pulmonary:     Effort: Pulmonary effort is normal.     Breath sounds: Normal breath sounds. No wheezing.  Abdominal:     General: Abdomen is flat. Bowel sounds are normal.      Palpations: Abdomen is soft.     Tenderness: There is abdominal tenderness in the suprapubic area. There is no right CVA tenderness or left CVA tenderness.  Musculoskeletal:     Cervical back: Normal range of motion and neck supple. No tenderness.  Skin:    General: Skin is warm and dry.     Capillary Refill: Capillary refill takes less than 2 seconds.  Neurological:     Mental Status: He is alert and oriented to person, place, and time.     ED Results / Procedures / Treatments   Labs (all labs ordered are listed, but only abnormal results are displayed) Labs Reviewed - No data to display  EKG None  Radiology No results found.  Procedures Procedures   Medications Ordered in ED Medications - No data to display  ED Course/ Medical Decision Making/ A&P                           Medical Decision Making  56 year old male presents to the ED for evaluation.  Please see HPI for further details.  On examination patient uncomfortable, unable to sit still.  Patient complaining of suprapubic abdominal tenderness.  The patient is afebrile and nontachycardic.  The patient is not hypoxic.  Patient bladder scan showed 450 mL of urine in bladder.  The patient had a Foley catheter placed with return of frank bloody urine and immediate relief of suprapubic abdominal tenderness.  Patient urine culture has not yet resulted, I will advise the patient to continue taking Keflex and follow-up with his urologist this week.  Patient was given return precautions and he voiced understanding.  I counseled him and explained to the patient how to flush his Foley catheter and provided him with supplies in the event that his Foley catheter becomes clogged again.  Patient stable for discharge at this time.  Final Clinical Impression(s) / ED Diagnoses Final diagnoses:  Urinary retention    Rx / DC Orders ED Discharge Orders     None         Azucena Cecil, PA-C 03/02/22 5947     Regan Lemming, MD 03/02/22 1454

## 2022-03-02 NOTE — Discharge Instructions (Signed)
Please return to the ED with any new symptoms or worsening symptoms such as inability to pass urine, clogged Foley catheter Please continue taking Keflex. Please follow-up with urology this week Please utilize supplies provided to flush Foley catheter at home in the event that it becomes clogged again

## 2022-03-02 NOTE — ED Triage Notes (Signed)
PT c/o hematuria, foley placed in ER last night and patient removed home due to having blood clots with no output. Patient is pacing around room in discomfort.  Hx prostate ca with radiation last 3 months ago.

## 2022-03-03 LAB — URINE CULTURE: Culture: NO GROWTH

## 2022-03-20 ENCOUNTER — Inpatient Hospital Stay (HOSPITAL_COMMUNITY)
Admission: EM | Admit: 2022-03-20 | Discharge: 2022-03-23 | DRG: 669 | Disposition: A | Discharge status: Home / Self Care | Payer: BC Managed Care – PPO | Attending: Internal Medicine | Admitting: Internal Medicine

## 2022-03-20 DIAGNOSIS — Z794 Long term (current) use of insulin: Secondary | ICD-10-CM | POA: Diagnosis not present

## 2022-03-20 DIAGNOSIS — Z79899 Other long term (current) drug therapy: Secondary | ICD-10-CM | POA: Diagnosis not present

## 2022-03-20 DIAGNOSIS — Z923 Personal history of irradiation: Secondary | ICD-10-CM | POA: Diagnosis not present

## 2022-03-20 DIAGNOSIS — Z87891 Personal history of nicotine dependence: Secondary | ICD-10-CM

## 2022-03-20 DIAGNOSIS — C7951 Secondary malignant neoplasm of bone: Secondary | ICD-10-CM | POA: Diagnosis present

## 2022-03-20 DIAGNOSIS — E119 Type 2 diabetes mellitus without complications: Secondary | ICD-10-CM | POA: Diagnosis present

## 2022-03-20 DIAGNOSIS — I10 Essential (primary) hypertension: Secondary | ICD-10-CM | POA: Diagnosis present

## 2022-03-20 DIAGNOSIS — Y842 Radiological procedure and radiotherapy as the cause of abnormal reaction of the patient, or of later complication, without mention of misadventure at the time of the procedure: Secondary | ICD-10-CM | POA: Diagnosis present

## 2022-03-20 DIAGNOSIS — D62 Acute posthemorrhagic anemia: Secondary | ICD-10-CM | POA: Diagnosis present

## 2022-03-20 DIAGNOSIS — R339 Retention of urine, unspecified: Secondary | ICD-10-CM | POA: Diagnosis present

## 2022-03-20 DIAGNOSIS — E785 Hyperlipidemia, unspecified: Secondary | ICD-10-CM | POA: Diagnosis present

## 2022-03-20 DIAGNOSIS — J45909 Unspecified asthma, uncomplicated: Secondary | ICD-10-CM | POA: Diagnosis present

## 2022-03-20 DIAGNOSIS — R338 Other retention of urine: Secondary | ICD-10-CM | POA: Diagnosis present

## 2022-03-20 DIAGNOSIS — D649 Anemia, unspecified: Secondary | ICD-10-CM

## 2022-03-20 DIAGNOSIS — R319 Hematuria, unspecified: Secondary | ICD-10-CM | POA: Diagnosis present

## 2022-03-20 DIAGNOSIS — C61 Malignant neoplasm of prostate: Secondary | ICD-10-CM | POA: Diagnosis present

## 2022-03-20 DIAGNOSIS — R310 Gross hematuria: Secondary | ICD-10-CM | POA: Diagnosis not present

## 2022-03-20 DIAGNOSIS — N3041 Irradiation cystitis with hematuria: Principal | ICD-10-CM | POA: Diagnosis present

## 2022-03-20 LAB — CBC WITH DIFFERENTIAL/PLATELET
Abs Immature Granulocytes: 0.02 10*3/uL (ref 0.00–0.07)
Basophils Absolute: 0 10*3/uL (ref 0.0–0.1)
Basophils Relative: 0 %
Eosinophils Absolute: 0 10*3/uL (ref 0.0–0.5)
Eosinophils Relative: 0 %
HCT: 23 % — ABNORMAL LOW (ref 39.0–52.0)
Hemoglobin: 7.8 g/dL — ABNORMAL LOW (ref 13.0–17.0)
Immature Granulocytes: 0 %
Lymphocytes Relative: 2 %
Lymphs Abs: 0.1 10*3/uL — ABNORMAL LOW (ref 0.7–4.0)
MCH: 32.8 pg (ref 26.0–34.0)
MCHC: 33.9 g/dL (ref 30.0–36.0)
MCV: 96.6 fL (ref 80.0–100.0)
Monocytes Absolute: 0.5 10*3/uL (ref 0.1–1.0)
Monocytes Relative: 7 %
Neutro Abs: 6.3 10*3/uL (ref 1.7–7.7)
Neutrophils Relative %: 91 %
Platelets: 392 10*3/uL (ref 150–400)
RBC: 2.38 MIL/uL — ABNORMAL LOW (ref 4.22–5.81)
RDW: 13.3 % (ref 11.5–15.5)
WBC: 7 10*3/uL (ref 4.0–10.5)
nRBC: 0 % (ref 0.0–0.2)

## 2022-03-20 LAB — URINALYSIS, ROUTINE W REFLEX MICROSCOPIC

## 2022-03-20 LAB — BASIC METABOLIC PANEL
Anion gap: 11 (ref 5–15)
BUN: 16 mg/dL (ref 6–20)
CO2: 19 mmol/L — ABNORMAL LOW (ref 22–32)
Calcium: 8.8 mg/dL — ABNORMAL LOW (ref 8.9–10.3)
Chloride: 104 mmol/L (ref 98–111)
Creatinine, Ser: 1.2 mg/dL (ref 0.61–1.24)
GFR, Estimated: >60 mL/min (ref >60)
Glucose, Bld: 109 mg/dL — ABNORMAL HIGH (ref 70–99)
Potassium: 4.1 mmol/L (ref 3.5–5.1)
Sodium: 134 mmol/L — ABNORMAL LOW (ref 135–145)

## 2022-03-20 LAB — I-STAT CHEM 8, ED
BUN: 14 mg/dL (ref 6–20)
Calcium, Ion: 1.13 mmol/L — ABNORMAL LOW (ref 1.15–1.40)
Chloride: 102 mmol/L (ref 98–111)
Creatinine, Ser: 1.2 mg/dL (ref 0.61–1.24)
Glucose, Bld: 106 mg/dL — ABNORMAL HIGH (ref 70–99)
HCT: 23 % — ABNORMAL LOW (ref 39.0–52.0)
Hemoglobin: 7.8 g/dL — ABNORMAL LOW (ref 13.0–17.0)
Potassium: 4.1 mmol/L (ref 3.5–5.1)
Sodium: 134 mmol/L — ABNORMAL LOW (ref 135–145)
TCO2: 21 mmol/L — ABNORMAL LOW (ref 22–32)

## 2022-03-20 LAB — URINALYSIS, MICROSCOPIC (REFLEX)
Bacteria, UA: NONE SEEN
RBC / HPF: >50 RBC/hpf (ref 0–5)
Squamous Epithelial / HPF: NONE SEEN (ref 0–5)
WBC, UA: NONE SEEN WBC/hpf (ref 0–5)

## 2022-03-20 LAB — I-STAT BETA HCG BLOOD, ED (MC, WL, AP ONLY): I-stat hCG, quantitative: <5 m[IU]/mL (ref <5)

## 2022-03-20 MED ORDER — LIDOCAINE HCL (CARDIAC) PF 100 MG/5ML IV SOSY
PREFILLED_SYRINGE | INTRAVENOUS | Status: AC
  Filled 2022-03-20: qty 5

## 2022-03-20 MED ORDER — LIDOCAINE HCL URETHRAL/MUCOSAL 2 % EX GEL
1.0000 | Freq: Once | CUTANEOUS | Status: AC
  Administered 2022-03-20: 1 via URETHRAL
  Filled 2022-03-20: qty 11

## 2022-03-20 MED ORDER — ENZALUTAMIDE 40 MG PO TABS: 80.0000 mg | ORAL_TABLET | Freq: Two times a day (BID) | ORAL | Status: DC

## 2022-03-20 MED ORDER — FENTANYL CITRATE PF 50 MCG/ML IJ SOSY
50.0000 ug | PREFILLED_SYRINGE | Freq: Once | INTRAMUSCULAR | Status: AC
  Administered 2022-03-20: 50 ug via INTRAVENOUS
  Filled 2022-03-20: qty 1

## 2022-03-20 MED ORDER — AMLODIPINE BESYLATE 5 MG PO TABS
5.0000 mg | ORAL_TABLET | Freq: Every day | ORAL | Status: DC
  Administered 2022-03-22 – 2022-03-23 (×2): 5 mg via ORAL
  Filled 2022-03-20 (×2): qty 1

## 2022-03-20 MED ORDER — ALPRAZOLAM 0.25 MG PO TABS
0.2500 mg | ORAL_TABLET | Freq: Three times a day (TID) | ORAL | Status: DC | PRN
  Administered 2022-03-21: 0.25 mg via ORAL
  Filled 2022-03-20: qty 1

## 2022-03-20 MED ORDER — MORPHINE SULFATE (PF) 2 MG/ML IV SOLN
2.0000 mg | INTRAVENOUS | Status: DC | PRN
  Administered 2022-03-21 – 2022-03-23 (×6): 2 mg via INTRAVENOUS
  Filled 2022-03-20 (×6): qty 1

## 2022-03-20 NOTE — H&P (Signed)
PCP:   [name], MD   Chief Complaint:  Hematuria  HPI: This is a 56 year old male with history of prostate cancer with mets.  The approximately 5 months ago he underwent radiation therapy and is currently maintained on Xtandi.  Since his radiation therapy, the patient has been doing well.  Two weeks ago he developed hematuria.  He came to the ER where Foley was placed.  He was discharged with the foley in place.  The next day at home the foley clotted off and he removed it.  He passed a handful of clots but has been urinating successfully since.  Today he again developed hematuria with clots.  He had abdominal pain all day.  He denies fever, chills, nausea, vomiting or burning urination.  He came to the ER.  In the ER at 82 French catheter was placed which clotted off, a 22 three-way French catheter was placed which also clotted and was removed.  The hospitalist service has been asked admit.  Urology has been consulted and will see patient in the ER.  Admission the patient's hemoglobin is 7.8, baseline hemoglobin 12.9, last done in 2017  History provided by the patient and his significant other was present at bedside.  Review of Systems:  The patient endorses hematuria, abdominal pain and clots.  The patient denies anorexia, fever, weight loss,, vision loss, decreased hearing, hoarseness, chest pain, syncope, dyspnea on exertion, peripheral edema, balance deficits, hemoptysis, melena, hematochezia, severe indigestion/heartburn, hematuria, incontinence, genital sores, muscle weakness, suspicious skin lesions, transient blindness, difficulty walking, depression, unusual weight change, abnormal bleeding, enlarged lymph nodes, angioedema, and breast masses.  Past Medical History: Past Medical History:  Diagnosis Date   Asthma    Cancer ([place])    prostate cancer    Diabetes mellitus without complication ([place])    History of bronchitis    Hypertension    Prostate cancer ([place])    Wears glasses     Past Surgical History:  Procedure Laterality Date   EYE SURGERY     laser eye surgery bilat    left groin hernia      LYMPHADENECTOMY Bilateral 09/02/2015   Procedure: BILATERAL LYMPHADENECTOMY;  Surgeon: Raynelle Bring, MD;  Location: [facility];  Service: Urology;  Laterality: Bilateral;   ROBOT ASSISTED LAPAROSCOPIC RADICAL PROSTATECTOMY N/A 09/02/2015   Procedure: XI ROBOTIC ASSISTED LAPAROSCOPIC RADICAL PROSTATECTOMY LEVEL 2;  Surgeon: Raynelle Bring, MD;  Location: [facility];  Service: Urology;  Laterality: N/A;   schwannoma     removed approx 16 to 18 years ago    TONSILLECTOMY      Medications: Prior to Admission medications   Medication Sig Start Date End Date Taking? Authorizing Provider  ALPRAZolam (XANAX) 0.25 MG tablet Take 0.25 mg by mouth 3 (three) times daily as needed. 03/17/22   [provider]  amLODipine (NORVASC) 2.5 MG tablet  12/29/17   [provider]  amLODipine (NORVASC) 5 MG tablet Take 5 mg by mouth daily. 06/23/21   [provider]  cephALEXin (KEFLEX) 500 MG capsule Take 1 capsule (500 mg total) by mouth 4 (four) times daily. 03/01/22   Milton Ferguson, MD  Continuous Blood Gluc Sensor (FREESTYLE LIBRE 2 SENSOR) MISC USE TO MONITOR CBG EVERY 14 DAYS AS DIRECTED 06/11/21   [provider]  insulin lispro (HUMALOG) 100 UNIT/ML injection Inject into the skin.    [provider]  Insulin Pen Needle (B-D UF III MINI PEN NEEDLES) 31G X 5 MM MISC USE TO INJECT INSULIN  4 TIMES DAILY E10.9 01/22/16   [provider]  Insulin Syringe-Needle U-100 (B-D INS SYRINGE 0.5CC/31GX5/16) 31G X 5/16" 0.5 ML MISC use 1 syringe as directed (does 5 injections a day 02/04/10   [provider]  LYUMJEV TEMPO PEN 100 UNIT/ML SOPN Inject into the skin. 02/05/22   [provider]  NOVOLOG FLEXPEN 100 UNIT/ML FlexPen Inject 4-8 Units into the skin 3 (three) times daily. Sliding Scale 07/25/15   [provider]  PROAIR HFA 108  (90 Base) MCG/ACT inhaler Inhale 1 puff into the lungs every 6 (six) hours as needed for wheezing or shortness of breath.  07/26/15   [provider]  QUICKVUE AT-HOME COVID-19 TEST KIT See admin instructions. 06/09/21   [provider]  ramipril (ALTACE) 10 MG capsule Take 10 mg by mouth daily.    [provider]  rosuvastatin (CRESTOR) 5 MG tablet TAKE 1 TABLET BY MOUTH TWICE A WEEK    [provider]  TRESIBA FLEXTOUCH 200 UNIT/ML SOPN Inject 36 Units into the skin every morning.  07/25/15   [provider]  XTANDI 40 MG tablet Take 80 mg by mouth 2 (two) times daily. 03/04/22   [provider]    Allergies:  No Known Allergies  Social History:  reports that he quit smoking about 23 years ago. His smoking use included cigarettes. He has a 3.50 pack-year smoking history. He has never used smokeless tobacco. He reports current alcohol use. He reports that he does not use drugs.  Family History: No family history on file.  Physical Exam: Vitals:   03/20/22 1946 03/20/22 1949  BP: (!) 187/68 (!) 187/68  Pulse: (!) 115   Resp: 16   Temp: 98.6 F (37 C)   TempSrc: Oral   SpO2: 99%   Weight: 109.3 kg   Height: $Remove'5\' 10"'[id number]$  (1.778 m)     General:  Alert and oriented times three, well developed and nourished, no acute distress Eyes: PERRLA, pink conjunctiva, no scleral icterus ENT: Moist oral mucosa, neck supple, no thyromegaly Lungs: clear to ascultation, no wheeze, no crackles, no use of accessory muscles Cardiovascular: regular rate and rhythm, no regurgitation, no gallops, no murmurs. No carotid bruits, no JVD Abdomen: soft, positive BS, non-tender, non-distended, no organomegaly, not an acute abdomen GU: Hematuria Neuro: CN II - XII grossly intact, sensation intact Musculoskeletal: strength 5/5 all extremities, 2+ B/L pitting edema Skin: no rash, no subcutaneous crepitation, no decubitus Psych: appropriate patient   Labs on  Admission:  Recent Labs    03/20/22 2109 03/20/22 2117  NA 134* 134*  K 4.1 4.1  CL 102 104  CO2  --  19*  GLUCOSE 106* 109*  BUN 14 16  CREATININE 1.20 1.20  CALCIUM  --  8.8*   No results for input(s): "AST", "ALT", "ALKPHOS", "BILITOT", "PROT", "ALBUMIN" in the last 72 hours. No results for input(s): "LIPASE", "AMYLASE" in the last 72 hours. Recent Labs    03/20/22 2109 03/20/22 2117  WBC  --  7.0  NEUTROABS  --  6.3  HGB 7.8* 7.8*  HCT 23.0* 23.0*  MCV  --  96.6  PLT  --  392   No results for input(s): "CKTOTAL", "CKMB", "CKMBINDEX", "TROPONINI" in the last 72 hours. Invalid input(s): "POCBNP" No results for input(s): "DDIMER" in the last 72 hours. No results for input(s): "HGBA1C" in the last 72 hours. No results for input(s): "CHOL", "HDL", "LDLCALC", "TRIG", "CHOLHDL", "LDLDIRECT" in the last 72 hours. No  results for input(s): "TSH", "T4TOTAL", "T3FREE", "THYROIDAB" in the last 72 hours.  Invalid input(s): "FREET3" No results for input(s): "VITAMINB12", "FOLATE", "FERRITIN", "TIBC", "IRON", "RETICCTPCT" in the last 72 hours.  Micro Results: No results found for this or any previous visit (from the past 240 hour(s)).   Radiological Exams on Admission: No results found.  Assessment/Plan Present on Admission:  Hematuria with clots/acute urinary retention -Admit to [place] -Urology currently at bedside.  Defer management to urology -Serial H&H's -SCDs on for DVT prophylaxis  Acute anemia, secondary to acute hemorrhage -Serial H&H.  See above -Baseline hemoglobin 12.9 -Patient has been typed and screened.  Transfusion not ordered.  Transfuse if hemoglobin less than 7  Hypertension uncontrolled -Likely related to pain -As needed blood pressure medications ordered  Dyslipidemia -Stable resume Crestor  Borderline diabetes mellitus -Sliding scale insulin   Prostate cancer ([place])  Malignant neoplasm of prostate metastatic to bone [facility]) -Per  oncologist  Mckinley Adelstein 03/20/2022, 10:50 PM

## 2022-03-20 NOTE — ED Provider Notes (Signed)
[facility] DEPT Provider Note   CSN: [id number] Arrival date & time: 03/20/22  1932     History  No chief complaint on file.   [name] is a 56 y.o. male.  Patient is a 56 year old male who presents with urinary retention and bloody urine.  He states that he has a history of prostate cancer status post radiation.  Last month he had 2 episodes where he had urinary retention and had to have a Foley catheter placed.  The urine was bloody and he had clots.  He actually pulled the catheter out himself a couple days after the Foley catheter was placed due to he felt like it was clogged.  He followed up with urology after that and seem to be doing okay.  However he has had pretty much daily hematuria with clots.  Today he complains of feeling like his urine is blocked again.  He complains of lower abdominal pain.  No nausea or vomiting.  No fevers.  [facility] is followed by [facility].  He states that he has only had dribbling urine throughout the day.  Now he is not able to pee at all and he feels like he is blocked.       Home Medications Prior to Admission medications   Medication Sig Start Date End Date Taking? Authorizing Provider  ALPRAZolam (XANAX) 0.25 MG tablet Take 0.25 mg by mouth 3 (three) times daily as needed. 03/17/22   [provider]  amLODipine (NORVASC) 2.5 MG tablet  12/29/17   [provider]  amLODipine (NORVASC) 5 MG tablet Take 5 mg by mouth daily. 06/23/21   [provider]  cephALEXin (KEFLEX) 500 MG capsule Take 1 capsule (500 mg total) by mouth 4 (four) times daily. 03/01/22   Milton Ferguson, MD  Continuous Blood Gluc Sensor (FREESTYLE LIBRE 2 SENSOR) MISC USE TO MONITOR CBG EVERY 14 DAYS AS DIRECTED 06/11/21   [provider]  insulin lispro (HUMALOG) 100 UNIT/ML injection Inject into the skin.    [provider]  Insulin Pen Needle (B-D UF III MINI PEN NEEDLES) 31G X 5 MM MISC USE TO INJECT  INSULIN 4 TIMES DAILY E10.9 01/22/16   [provider]  Insulin Syringe-Needle U-100 (B-D INS SYRINGE 0.5CC/31GX5/16) 31G X 5/16" 0.5 ML MISC use 1 syringe as directed (does 5 injections a day 02/04/10   [provider]  LYUMJEV TEMPO PEN 100 UNIT/ML SOPN Inject into the skin. 02/05/22   [provider]  NOVOLOG FLEXPEN 100 UNIT/ML FlexPen Inject 4-8 Units into the skin 3 (three) times daily. Sliding Scale 07/25/15   [provider]  PROAIR HFA 108 (90 Base) MCG/ACT inhaler Inhale 1 puff into the lungs every 6 (six) hours as needed for wheezing or shortness of breath.  07/26/15   [provider]  QUICKVUE AT-HOME COVID-19 TEST KIT See admin instructions. 06/09/21   [provider]  ramipril (ALTACE) 10 MG capsule Take 10 mg by mouth daily.    [provider]  rosuvastatin (CRESTOR) 5 MG tablet TAKE 1 TABLET BY MOUTH TWICE A WEEK    [provider]  TRESIBA FLEXTOUCH 200 UNIT/ML SOPN Inject 36 Units into the skin every morning.  07/25/15   [provider]  XTANDI 40 MG tablet Take 80 mg by mouth 2 (two) times daily. 03/04/22   [provider]      Allergies    Patient has no known allergies.    Review of  Systems   Review of Systems  Constitutional:  Negative for chills, diaphoresis, fatigue and fever.  HENT:  Negative for congestion, rhinorrhea and sneezing.   Eyes: Negative.   Respiratory:  Negative for cough, chest tightness and shortness of breath.   Cardiovascular:  Negative for chest pain and leg swelling.  Gastrointestinal:  Positive for abdominal pain. Negative for blood in stool, diarrhea, nausea and vomiting.  Genitourinary:  Positive for difficulty urinating and hematuria. Negative for flank pain and frequency.  Musculoskeletal:  Negative for arthralgias and back pain.  Skin:  Negative for rash.  Neurological:  Negative for dizziness, speech difficulty, weakness, numbness and headaches.    Physical  Exam Updated Vital Signs BP (!) 187/114   Pulse (!) 105   Temp 98.6 F (37 C) (Oral)   Resp 16   Ht 5\' 10"  (1.778 m)   Wt 109.3 kg   SpO2 100%   BMI 34.58 kg/m  Physical Exam Constitutional:      Appearance: He is well-developed.     Comments: Appears uncomfortable  HENT:     Head: Normocephalic and atraumatic.  Eyes:     Pupils: Pupils are equal, round, and reactive to light.  Cardiovascular:     Rate and Rhythm: Normal rate and regular rhythm.     Heart sounds: Normal heart sounds.  Pulmonary:     Effort: Pulmonary effort is normal. No respiratory distress.     Breath sounds: Normal breath sounds. No wheezing or rales.  Chest:     Chest wall: No tenderness.  Abdominal:     General: Bowel sounds are normal.     Palpations: Abdomen is soft.     Tenderness: There is abdominal tenderness. There is no guarding or rebound.  Musculoskeletal:        General: Normal range of motion.     Cervical back: Normal range of motion and neck supple.  Lymphadenopathy:     Cervical: No cervical adenopathy.  Skin:    General: Skin is warm and dry.     Findings: No rash.  Neurological:     Mental Status: He is alert and oriented to person, place, and time.     ED Results / Procedures / Treatments   Labs (all labs ordered are listed, but only abnormal results are displayed) Labs Reviewed  URINALYSIS, ROUTINE W REFLEX MICROSCOPIC - Abnormal; Notable for the following components:      Result Value   Color, Urine RED (*)    APPearance TURBID (*)    Glucose, UA   (*)    Value: TEST NOT REPORTED DUE TO COLOR INTERFERENCE OF URINE PIGMENT   Hgb urine dipstick   (*)    Value: TEST NOT REPORTED DUE TO COLOR INTERFERENCE OF URINE PIGMENT   Bilirubin Urine   (*)    Value: TEST NOT REPORTED DUE TO COLOR INTERFERENCE OF URINE PIGMENT   Ketones, ur   (*)    Value: TEST NOT REPORTED DUE TO COLOR INTERFERENCE OF URINE PIGMENT   Protein, ur   (*)    Value: TEST NOT REPORTED DUE TO COLOR  INTERFERENCE OF URINE PIGMENT   Nitrite   (*)    Value: TEST NOT REPORTED DUE TO COLOR INTERFERENCE OF URINE PIGMENT   Leukocytes,Ua   (*)    Value: TEST NOT REPORTED DUE TO COLOR INTERFERENCE OF URINE PIGMENT   All other components within normal limits  BASIC METABOLIC PANEL - Abnormal; Notable for the following components:  Sodium 134 (*)    CO2 19 (*)    Glucose, Bld 109 (*)    Calcium 8.8 (*)    All other components within normal limits  CBC WITH DIFFERENTIAL/PLATELET - Abnormal; Notable for the following components:   RBC 2.38 (*)    Hemoglobin 7.8 (*)    HCT 23.0 (*)    Lymphs Abs 0.1 (*)    All other components within normal limits  I-STAT CHEM 8, ED - Abnormal; Notable for the following components:   Sodium 134 (*)    Glucose, Bld 106 (*)    Calcium, Ion 1.13 (*)    TCO2 21 (*)    Hemoglobin 7.8 (*)    HCT 23.0 (*)    All other components within normal limits  URINE CULTURE  URINALYSIS, MICROSCOPIC (REFLEX)  I-STAT BETA HCG BLOOD, ED ([facility], [facility], [facility] ONLY)  TYPE AND SCREEN    EKG None  Radiology No results found.  Procedures Procedures    Medications Ordered in ED Medications  lidocaine (cardiac) 100 mg/35mL (XYLOCAINE) 100 MG/5ML injection 2% (  Not Given 03/20/22 2235)  lidocaine (XYLOCAINE) 2 % jelly 1 Application (1 Application Urethral Given 03/20/22 2229)  fentaNYL (SUBLIMAZE) injection 50 mcg (50 mcg Intravenous Given 03/20/22 2242)    ED Course/ Medical Decision Making/ A&P                           Medical Decision Making Amount and/or Complexity of Data Reviewed Labs: ordered. Radiology: ordered.  Risk Prescription drug management.   Patient is a 56 year old male who presents with urinary retention and gross hematuria.  Foley catheter was placed on arrival with about 3 to 400 cc of grossly bloody urine and some clots.  Ultimately this catheter clotted and was not draining.  Is unable to be irrigated.  It was pulled and a 22-gauge three-way  catheter was placed.  This also did not drain.  We were able to flush it somewhat and withdraw bloody urine from the irrigation port but it never would drain.  Ultimately patient was adamantly insisting that the catheter be removed.  It was removed and a large clot passed.  His hemoglobin is noted to be low at 7.8.  On chart review, the last labs he had was about 6 years ago and his hemoglobin was normal at that time.  His other labs are nonconcerning.  I discussed this patient with urology, Dr. Kathrynn Ducking who will come in and see the pt given that we are unable to get a catheter in that would drain.  Also spoke with Dr. Claria Dice who will admit the pt.  Urology is requesting a CT abd/pelvis hematuria protocol which has been ordered.  Final Clinical Impression(s) / ED Diagnoses Final diagnoses:  Urinary retention  Gross hematuria  Anemia, unspecified type    Rx / DC Orders ED Discharge Orders     None         Malvin Johns, MD 03/20/22 2336

## 2022-03-20 NOTE — ED Triage Notes (Signed)
Ambulatory to ED with c/o pain with catheter. Hx of hematuria and blood clots. Unable to pass clots d/t small size of catheter.   States last time this happened, had to have catheter replaced.   Hx of prostate ca with radiation.

## 2022-03-20 NOTE — Consult Note (Addendum)
Urology Consult   Physician requesting consult: Dr. Quintella Baton, MD  Reason for consult: Clot retention, gross hematuria  History of Present Illness: [name] is a 56 y.o. 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  Patient notes that he developed gross hematuria approximately 2 weeks ago and was passing large sized clots at that time.  He initially had a Foley catheter but had it removed.  He developed gross hematuria again yesterday with clot retention.  He took 2 baby aspirin hoping to thin out his blood to help him pass the clots but was unsuccessful.  He then presented to the emergency department for further evaluation.  In the emergency department, he is afebrile, tachycardic and hypertensive likely secondary to pain.  Labs notable for hemoglobin 7.8, creatinine 1.2.  UA without concern for infection.  Urine culture in process.  Cross-sectional imaging in process.  He denies any fevers or chills.  Denies any dysuria.  [facility] in August with plans for follow-up in November.  ED nursing staff initially placed an 29 French catheter due to urinary retention.  That catheter clotted off and they exchanged it for 22 [place] three-way catheter.  This catheter subsequently clotted off and it was removed.  I was able to place a 24 three-way hematuria catheter with minimal clot return.  Approximately 400 cc of watermelon colored urine.  Catheter irrigated without any issue and draining urine was light pink.  The third port was plugged and CBI was not started.    Past Medical History:  Diagnosis Date   Asthma    Cancer ([place])    prostate cancer    Diabetes mellitus without complication ([place])    History of bronchitis    Hypertension    Prostate cancer ([place])    Wears glasses     Past Surgical History:  Procedure Laterality Date   EYE  SURGERY     laser eye surgery bilat    left groin hernia      LYMPHADENECTOMY Bilateral 09/02/2015   Procedure: BILATERAL LYMPHADENECTOMY;  Surgeon: Raynelle Bring, MD;  Location: [facility];  Service: Urology;  Laterality: Bilateral;   ROBOT ASSISTED LAPAROSCOPIC RADICAL PROSTATECTOMY N/A 09/02/2015   Procedure: XI ROBOTIC ASSISTED LAPAROSCOPIC RADICAL PROSTATECTOMY LEVEL 2;  Surgeon: Raynelle Bring, MD;  Location: [facility];  Service: Urology;  Laterality: N/A;   schwannoma     removed approx 16 to 18 years ago    [facility] Medications:  Home Meds:  No outpatient medications have been marked as taking for the 03/20/22 encounter [facility] Encounter).    Scheduled Meds:  lidocaine (cardiac) 100 mg/95m       Continuous Infusions: PRN Meds:.lidocaine (cardiac) 100 mg/544m Allergies: No Known Allergies  No family history on file.  Social History:  reports that he quit smoking about 23 years ago. His smoking use included cigarettes. He has a 3.50 pack-year smoking history. He has never used smokeless tobacco. He reports current alcohol use. He reports that he does not use drugs.  ROS: A complete review of systems was performed.  All systems are negative except for pertinent findings as noted.  Physical Exam:  Vital signs in last 24 hours: Temp:  [98.6 F (37 C)] 98.6 F (37 C) (10/06 1946) Pulse Rate:  [105-115] 105 (10/06 2130) Resp:  [16] 16 (10/06  1946) BP: (187)/(68-114) 187/114 (10/06 2130) SpO2:  [99 %-100 %] 100 % (10/06 2130) Weight:  [109.3 kg] 109.3 kg (10/06 1946)  Constitutional:  Alert and oriented, in mild distress Cardiovascular: Regular rate  Respiratory: Normal respiratory effort on room air GI: Abdomen soft, suprapubic distention secondary to urinary retention GU: Blood at urethral meatus, circumcised male phallus Neurologic: Grossly intact, no focal deficits  Laboratory Data:  Recent Labs    03/20/22 2109 03/20/22 2117  WBC  --  7.0   HGB 7.8* 7.8*  HCT 23.0* 23.0*  PLT  --  392    Recent Labs    03/20/22 2109 03/20/22 2117  NA 134* 134*  K 4.1 4.1  CL 102 104  GLUCOSE 106* 109*  BUN 14 16  CALCIUM  --  8.8*  CREATININE 1.20 1.20     Results for orders placed or performed during the hospital encounter of 03/20/22 (from the past 24 hour(s))  Urinalysis, Routine w reflex microscopic Urine, Clean Catch     Status: Abnormal   Collection Time: 03/20/22  8:07 PM  Result Value Ref Range   Color, Urine RED (A) YELLOW   APPearance TURBID (A) CLEAR   Specific Gravity, Urine  1.005 - 1.030    TEST NOT REPORTED DUE TO COLOR INTERFERENCE OF URINE PIGMENT   pH  5.0 - 8.0    TEST NOT REPORTED DUE TO COLOR INTERFERENCE OF URINE PIGMENT   Glucose, UA (A) NEGATIVE mg/dL    TEST NOT REPORTED DUE TO COLOR INTERFERENCE OF URINE PIGMENT   Hgb urine dipstick (A) NEGATIVE    TEST NOT REPORTED DUE TO COLOR INTERFERENCE OF URINE PIGMENT   Bilirubin Urine (A) NEGATIVE    TEST NOT REPORTED DUE TO COLOR INTERFERENCE OF URINE PIGMENT   Ketones, ur (A) NEGATIVE mg/dL    TEST NOT REPORTED DUE TO COLOR INTERFERENCE OF URINE PIGMENT   Protein, ur (A) NEGATIVE mg/dL    TEST NOT REPORTED DUE TO COLOR INTERFERENCE OF URINE PIGMENT   Nitrite (A) NEGATIVE    TEST NOT REPORTED DUE TO COLOR INTERFERENCE OF URINE PIGMENT   Leukocytes,Ua (A) NEGATIVE    TEST NOT REPORTED DUE TO COLOR INTERFERENCE OF URINE PIGMENT  Urinalysis, Microscopic (reflex)     Status: None   Collection Time: 03/20/22  8:07 PM  Result Value Ref Range   RBC / HPF >50 0 - 5 RBC/hpf   WBC, UA NONE SEEN 0 - 5 WBC/hpf   Bacteria, UA NONE SEEN NONE SEEN   Squamous Epithelial / LPF NONE SEEN 0 - 5  I-Stat beta hCG blood, ED ([facility], [facility], [facility] only)     Status: None   Collection Time: 03/20/22  8:27 PM  Result Value Ref Range   I-stat hCG, quantitative <5.0 <5 mIU/mL   Comment 3          I-stat chem 8, ED     Status: Abnormal   Collection Time: 03/20/22  9:09 PM  Result  Value Ref Range   Sodium 134 (L) 135 - 145 mmol/L   Potassium 4.1 3.5 - 5.1 mmol/L   Chloride 102 98 - 111 mmol/L   BUN 14 6 - 20 mg/dL   Creatinine, Ser 1.20 0.61 - 1.24 mg/dL   Glucose, Bld 106 (H) 70 - 99 mg/dL   Calcium, Ion 1.13 (L) 1.15 - 1.40 mmol/L   TCO2 21 (L) 22 - 32 mmol/L   Hemoglobin 7.8 (L) 13.0 - 17.0 g/dL   HCT  23.0 (L) 39.0 - 58.0 %  Basic metabolic panel     Status: Abnormal   Collection Time: 03/20/22  9:17 PM  Result Value Ref Range   Sodium 134 (L) 135 - 145 mmol/L   Potassium 4.1 3.5 - 5.1 mmol/L   Chloride 104 98 - 111 mmol/L   CO2 19 (L) 22 - 32 mmol/L   Glucose, Bld 109 (H) 70 - 99 mg/dL   BUN 16 6 - 20 mg/dL   Creatinine, Ser 1.20 0.61 - 1.24 mg/dL   Calcium 8.8 (L) 8.9 - 10.3 mg/dL   GFR, Estimated >60 >60 mL/min   Anion gap 11 5 - 15  CBC with Differential     Status: Abnormal   Collection Time: 03/20/22  9:17 PM  Result Value Ref Range   WBC 7.0 4.0 - 10.5 K/uL   RBC 2.38 (L) 4.22 - 5.81 MIL/uL   Hemoglobin 7.8 (L) 13.0 - 17.0 g/dL   HCT 23.0 (L) 39.0 - 52.0 %   MCV 96.6 80.0 - 100.0 fL   MCH 32.8 26.0 - 34.0 pg   MCHC 33.9 30.0 - 36.0 g/dL   RDW 13.3 11.5 - 15.5 %   Platelets 392 150 - 400 K/uL   nRBC 0.0 0.0 - 0.2 %   Neutrophils Relative % 91 %   Neutro Abs 6.3 1.7 - 7.7 K/uL   Lymphocytes Relative 2 %   Lymphs Abs 0.1 (L) 0.7 - 4.0 K/uL   Monocytes Relative 7 %   Monocytes Absolute 0.5 0.1 - 1.0 K/uL   Eosinophils Relative 0 %   Eosinophils Absolute 0.0 0.0 - 0.5 K/uL   Basophils Relative 0 %   Basophils Absolute 0.0 0.0 - 0.1 K/uL   Immature Granulocytes 0 %   Abs Immature Granulocytes 0.02 0.00 - 0.07 K/uL  Type and screen [place]     Status: None   Collection Time: 03/20/22  9:17 PM  Result Value Ref Range   ABO/RH(D) O POS    Antibody Screen NEG    Sample Expiration      03/23/2022,2359 Performed at [facility], [place] [address] [place], [place] [address]    No results found for  this or any previous visit (from the past 240 hour(s)).  Renal Function: Recent Labs    03/20/22 2109 03/20/22 2117  CREATININE 1.20 1.20   Estimated Creatinine Clearance: 87.1 mL/min (by C-G formula based on SCr of 1.2 mg/dL).  Radiologic Imaging: No results found.  I independently reviewed the above imaging studies.  Impression/Recommendation Castrate sensitive metastatic prostate cancer status post radiation therapy, ADT, s/p RALP in 2017 with Dr. 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 -Continue hematuria catheter at this time to gravity drainage.  Urine color has greatly improved and is now light pink.  If hematuria worsens, consider starting CBI. -We will follow-up CT images to rule out any other cause of patient's hematuria. -Urology will continue to follow along.   Coralyn Pear, MD Resident Physician [facility] 03/20/2022, 11:35 PM   I have seen and examined the patient and agree with the above assessment and plan.  In clot retention with persistent gross hematuria. CT with large well formed clot in bladder. Will take to OR for cystoscopy, clot evacuation, fulguration  of bladder.  [facility]  Pager: [phone]

## 2022-03-20 NOTE — ED Notes (Signed)
3 way catheter removed per pt's insistence.  EDP notified.  Pt passed large clot.  Pt in 10/10 pain. Unable to pass urine through penis after catheter removed. EDP aware.  Awaiting urology consult.

## 2022-03-20 NOTE — ED Notes (Signed)
Removed 90F catheter. Replaced with 65F 3 way catheter. No urine return initially.  Irrigation yielded minimal result.  Pt in continued severe pain. Unable to irrigate further.  EDP at bedside.

## 2022-03-21 ENCOUNTER — Inpatient Hospital Stay (HOSPITAL_COMMUNITY): Payer: BC Managed Care – PPO | Admitting: Anesthesiology

## 2022-03-21 ENCOUNTER — Inpatient Hospital Stay (HOSPITAL_COMMUNITY): Payer: BC Managed Care – PPO

## 2022-03-21 ENCOUNTER — Encounter (HOSPITAL_COMMUNITY)
Admission: EM | Disposition: A | Discharge status: Home / Self Care | Payer: Self-pay | Source: Home / Self Care | Attending: Internal Medicine

## 2022-03-21 ENCOUNTER — Encounter (HOSPITAL_COMMUNITY): Payer: Self-pay | Admitting: Family Medicine

## 2022-03-21 ENCOUNTER — Other Ambulatory Visit: Payer: Self-pay

## 2022-03-21 DIAGNOSIS — R338 Other retention of urine: Secondary | ICD-10-CM

## 2022-03-21 DIAGNOSIS — I10 Essential (primary) hypertension: Secondary | ICD-10-CM

## 2022-03-21 HISTORY — PX: CYSTOSCOPY WITH FULGERATION: SHX6638

## 2022-03-21 LAB — BASIC METABOLIC PANEL
Anion gap: 10 (ref 5–15)
BUN: 16 mg/dL (ref 6–20)
CO2: 23 mmol/L (ref 22–32)
Calcium: 8.9 mg/dL (ref 8.9–10.3)
Chloride: 103 mmol/L (ref 98–111)
Creatinine, Ser: 1.01 mg/dL (ref 0.61–1.24)
GFR, Estimated: >60 mL/min (ref >60)
Glucose, Bld: 115 mg/dL — ABNORMAL HIGH (ref 70–99)
Potassium: 4.4 mmol/L (ref 3.5–5.1)
Sodium: 136 mmol/L (ref 135–145)

## 2022-03-21 LAB — HEMOGLOBIN A1C
Hgb A1c MFr Bld: 7.7 % — ABNORMAL HIGH (ref 4.8–5.6)
Mean Plasma Glucose: 174.29 mg/dL

## 2022-03-21 LAB — HIV ANTIBODY (ROUTINE TESTING W REFLEX): HIV Screen 4th Generation wRfx: NONREACTIVE

## 2022-03-21 LAB — PROTIME-INR
INR: 1 (ref 0.8–1.2)
Prothrombin Time: 13.3 seconds (ref 11.4–15.2)

## 2022-03-21 LAB — GLUCOSE, CAPILLARY
Glucose-Capillary: 93 mg/dL (ref 70–99)
Glucose-Capillary: 149 mg/dL — ABNORMAL HIGH (ref 70–99)
Glucose-Capillary: 280 mg/dL — ABNORMAL HIGH (ref 70–99)
Glucose-Capillary: 298 mg/dL — ABNORMAL HIGH (ref 70–99)

## 2022-03-21 LAB — HEMOGLOBIN AND HEMATOCRIT, BLOOD
HCT: 23.6 % — ABNORMAL LOW (ref 39.0–52.0)
HCT: 23.3 % — ABNORMAL LOW (ref 39.0–52.0)
Hemoglobin: 8.1 g/dL — ABNORMAL LOW (ref 13.0–17.0)
Hemoglobin: 7.8 g/dL — ABNORMAL LOW (ref 13.0–17.0)

## 2022-03-21 SURGERY — CYSTOSCOPY, WITH BLADDER FULGURATION
Anesthesia: General | Site: Urethra

## 2022-03-21 MED ORDER — PROMETHAZINE HCL 25 MG/ML IJ SOLN: 6.2500 mg | INTRAMUSCULAR | Status: DC | PRN

## 2022-03-21 MED ORDER — ACETAMINOPHEN 10 MG/ML IV SOLN
INTRAVENOUS | Status: DC | PRN
  Administered 2022-03-21: 1000 mg via INTRAVENOUS

## 2022-03-21 MED ORDER — LACTATED RINGERS IV SOLN: INTRAVENOUS | Status: DC | PRN

## 2022-03-21 MED ORDER — FENTANYL CITRATE PF 50 MCG/ML IJ SOSY
PREFILLED_SYRINGE | INTRAMUSCULAR | Status: AC
  Administered 2022-03-21: 50 ug via INTRAVENOUS
  Filled 2022-03-21: qty 2

## 2022-03-21 MED ORDER — DEXAMETHASONE SODIUM PHOSPHATE 10 MG/ML IJ SOLN
INTRAMUSCULAR | Status: AC
  Filled 2022-03-21: qty 1

## 2022-03-21 MED ORDER — SODIUM CHLORIDE 0.9 % IR SOLN
Status: DC | PRN
  Administered 2022-03-21: 3000 mL

## 2022-03-21 MED ORDER — OXYCODONE HCL 5 MG PO TABS: 5.0000 mg | ORAL_TABLET | Freq: Once | ORAL | Status: DC | PRN

## 2022-03-21 MED ORDER — FENTANYL CITRATE (PF) 100 MCG/2ML IJ SOLN
INTRAMUSCULAR | Status: DC | PRN
  Administered 2022-03-21: 100 ug via INTRAVENOUS

## 2022-03-21 MED ORDER — ENZALUTAMIDE 40 MG PO TABS: 160.0000 mg | ORAL_TABLET | Freq: Every day | ORAL | Status: DC

## 2022-03-21 MED ORDER — ACETAMINOPHEN 650 MG RE SUPP: 650.0000 mg | Freq: Four times a day (QID) | RECTAL | Status: DC | PRN

## 2022-03-21 MED ORDER — PROPOFOL 10 MG/ML IV BOLUS
INTRAVENOUS | Status: AC
  Filled 2022-03-21: qty 20

## 2022-03-21 MED ORDER — ACETAMINOPHEN 325 MG PO TABS
650.0000 mg | ORAL_TABLET | Freq: Four times a day (QID) | ORAL | Status: DC | PRN
  Administered 2022-03-23: 650 mg via ORAL
  Filled 2022-03-21: qty 2

## 2022-03-21 MED ORDER — ONDANSETRON HCL 4 MG/2ML IJ SOLN
INTRAMUSCULAR | Status: AC
  Filled 2022-03-21: qty 2

## 2022-03-21 MED ORDER — MIDAZOLAM HCL 2 MG/2ML IJ SOLN: 0.5000 mg | Freq: Once | INTRAMUSCULAR | Status: DC | PRN

## 2022-03-21 MED ORDER — ONDANSETRON HCL 4 MG/2ML IJ SOLN
INTRAMUSCULAR | Status: DC | PRN
  Administered 2022-03-21: 4 mg via INTRAVENOUS

## 2022-03-21 MED ORDER — ONDANSETRON HCL 4 MG/2ML IJ SOLN: 4.0000 mg | Freq: Four times a day (QID) | INTRAMUSCULAR | Status: DC | PRN

## 2022-03-21 MED ORDER — PHENYLEPHRINE 80 MCG/ML (10ML) SYRINGE FOR IV PUSH (FOR BLOOD PRESSURE SUPPORT)
PREFILLED_SYRINGE | INTRAVENOUS | Status: DC | PRN
  Administered 2022-03-21 (×5): 80 ug via INTRAVENOUS

## 2022-03-21 MED ORDER — IOHEXOL 300 MG/ML  SOLN
125.0000 mL | Freq: Once | INTRAMUSCULAR | Status: AC | PRN
  Administered 2022-03-21: 125 mL via INTRAVENOUS

## 2022-03-21 MED ORDER — PROPOFOL 10 MG/ML IV BOLUS
INTRAVENOUS | Status: DC | PRN
  Administered 2022-03-21: 200 mg via INTRAVENOUS

## 2022-03-21 MED ORDER — CIPROFLOXACIN IN D5W 400 MG/200ML IV SOLN
INTRAVENOUS | Status: AC
  Filled 2022-03-21: qty 200

## 2022-03-21 MED ORDER — INSULIN ASPART 100 UNIT/ML IJ SOLN
0.0000 [IU] | Freq: Three times a day (TID) | INTRAMUSCULAR | Status: DC
  Administered 2022-03-21: 8 [IU] via SUBCUTANEOUS
  Administered 2022-03-23: 3 [IU] via SUBCUTANEOUS

## 2022-03-21 MED ORDER — MEPERIDINE HCL 50 MG/ML IJ SOLN: 6.2500 mg | INTRAMUSCULAR | Status: DC | PRN

## 2022-03-21 MED ORDER — CIPROFLOXACIN IN D5W 400 MG/200ML IV SOLN
INTRAVENOUS | Status: DC | PRN
  Administered 2022-03-21: 400 mg via INTRAVENOUS

## 2022-03-21 MED ORDER — INSULIN DETEMIR 100 UNIT/ML ~~LOC~~ SOLN
10.0000 [IU] | Freq: Every day | SUBCUTANEOUS | Status: DC
  Administered 2022-03-21: 10 [IU] via SUBCUTANEOUS
  Filled 2022-03-21 (×4): qty 0.1

## 2022-03-21 MED ORDER — OXYCODONE HCL 5 MG/5ML PO SOLN: 5.0000 mg | Freq: Once | ORAL | Status: DC | PRN

## 2022-03-21 MED ORDER — FENTANYL CITRATE PF 50 MCG/ML IJ SOSY
25.0000 ug | PREFILLED_SYRINGE | INTRAMUSCULAR | Status: DC | PRN
  Administered 2022-03-21: 50 ug via INTRAVENOUS

## 2022-03-21 MED ORDER — LIDOCAINE 2% (20 MG/ML) 5 ML SYRINGE
INTRAMUSCULAR | Status: DC | PRN
  Administered 2022-03-21: 100 mg via INTRAVENOUS

## 2022-03-21 MED ORDER — INSULIN ASPART 100 UNIT/ML IJ SOLN
0.0000 [IU] | Freq: Every day | INTRAMUSCULAR | Status: DC
  Administered 2022-03-21: 3 [IU] via SUBCUTANEOUS

## 2022-03-21 MED ORDER — ONDANSETRON HCL 4 MG PO TABS: 4.0000 mg | ORAL_TABLET | Freq: Four times a day (QID) | ORAL | Status: DC | PRN

## 2022-03-21 MED ORDER — INSULIN ASPART 100 UNIT/ML IJ SOLN
0.0000 [IU] | Freq: Three times a day (TID) | INTRAMUSCULAR | Status: DC
  Administered 2022-03-21: 8 [IU] via SUBCUTANEOUS
  Filled 2022-03-21: qty 0.15

## 2022-03-21 MED ORDER — LIDOCAINE HCL (PF) 2 % IJ SOLN
INTRAMUSCULAR | Status: AC
  Filled 2022-03-21: qty 5

## 2022-03-21 MED ORDER — DEXAMETHASONE SODIUM PHOSPHATE 10 MG/ML IJ SOLN
INTRAMUSCULAR | Status: DC | PRN
  Administered 2022-03-21: 4 mg via INTRAVENOUS

## 2022-03-21 MED ORDER — ACETAMINOPHEN 10 MG/ML IV SOLN
INTRAVENOUS | Status: AC
  Filled 2022-03-21: qty 100

## 2022-03-21 MED ORDER — MIDAZOLAM HCL 2 MG/2ML IJ SOLN
INTRAMUSCULAR | Status: AC
  Filled 2022-03-21: qty 2

## 2022-03-21 MED ORDER — HYDRALAZINE HCL 20 MG/ML IJ SOLN: 5.0000 mg | INTRAMUSCULAR | Status: DC | PRN

## 2022-03-21 MED ORDER — SODIUM CHLORIDE 0.9 % IV BOLUS
250.0000 mL | Freq: Once | INTRAVENOUS | Status: AC
  Administered 2022-03-21: 250 mL via INTRAVENOUS

## 2022-03-21 MED ORDER — FENTANYL CITRATE (PF) 100 MCG/2ML IJ SOLN
INTRAMUSCULAR | Status: AC
  Filled 2022-03-21: qty 2

## 2022-03-21 MED ORDER — MIDAZOLAM HCL 5 MG/5ML IJ SOLN
INTRAMUSCULAR | Status: DC | PRN
  Administered 2022-03-21: 2 mg via INTRAVENOUS

## 2022-03-21 SURGICAL SUPPLY — 18 items
BAG DRN RND TRDRP ANRFLXCHMBR (UROLOGICAL SUPPLIES)
BAG URINE DRAIN 2000ML AR STRL (UROLOGICAL SUPPLIES) IMPLANT
BAG URO CATCHER STRL LF (MISCELLANEOUS) ×2 IMPLANT
DRAPE FOOT SWITCH (DRAPES) ×2 IMPLANT
ELECT REM PT RETURN 15FT ADLT (MISCELLANEOUS) ×2 IMPLANT
EVACUATOR MICROVAS BLADDER (UROLOGICAL SUPPLIES) IMPLANT
GLOVE SURG LX STRL 7.5 STRW (GLOVE) ×2 IMPLANT
GOWN STRL REUS W/ TWL XL LVL3 (GOWN DISPOSABLE) ×2 IMPLANT
GOWN STRL REUS W/TWL XL LVL3 (GOWN DISPOSABLE) ×1
KIT TURNOVER KIT A (KITS) IMPLANT
LOOP CUT BIPOLAR 24F LRG (ELECTROSURGICAL) IMPLANT
MANIFOLD NEPTUNE II (INSTRUMENTS) ×2 IMPLANT
PACK CYSTO (CUSTOM PROCEDURE TRAY) ×2 IMPLANT
PENCIL SMOKE EVACUATOR (MISCELLANEOUS) IMPLANT
SYR TOOMEY IRRIG 70ML (MISCELLANEOUS)
SYRINGE TOOMEY IRRIG 70ML (MISCELLANEOUS) IMPLANT
TUBING CONNECTING 10 (TUBING) ×2 IMPLANT
TUBING UROLOGY SET (TUBING) ×2 IMPLANT

## 2022-03-21 NOTE — ED Notes (Signed)
Pt says CBG was 99 according to left arm implant. He also says he didn't need to be "pricked" due to having implant.

## 2022-03-21 NOTE — Progress Notes (Signed)
  Progress Note   Patient: [name] TLX:[id number] DOB: [birth year] DOA: 03/20/2022     1 DOS: the patient was seen and examined on 03/21/2022   Brief hospital course: 56 year old male with history of prostate cancer with mets.  The approximately 5 months ago he underwent radiation therapy and is currently maintained on Xtandi.  Since his radiation therapy, the patient has been doing well.  Two weeks ago he developed hematuria.  He came to the ER where Foley was placed.  He was discharged with the foley in place.  The next day at home the foley clotted off and he removed it.  He passed a handful of clots but has been urinating successfully since.  Today he again developed hematuria with clots.  He had abdominal pain all day.  He denies fever, chills, nausea, vomiting or burning urination.  He came to the ER.  In the ER at 36 French catheter was placed which clotted off, a 22 three-way French catheter was placed which also clotted and was removed.  The hospitalist service has been asked admit.  Urology has been consulted   Assessment and Plan:  Hematuria with clots/acute urinary retention -Urology consulted and following -SCDs on for DVT prophylaxis -This AM, Urology was unable to irrigate cath with CT findings of mod clot burden. Pt now s/p cystoscopy with clot evacuation and fulguration -Urology recs to monitor another 24hrs -recheck bmet and CBC in AM   Acute anemia, secondary to acute hemorrhage -Baseline hemoglobin 12.9 -Patient has been typed and screened.   -Hgb stable at present without need for transfusion   Hypertension uncontrolled -Likely related to pain -BP currently stable -Cont PRN bp meds as needed   Dyslipidemia -Stable resume Crestor   Borderline diabetes mellitus -Sliding scale insulin -glycemic trends stable    Prostate cancer ([place])  Malignant neoplasm of prostate metastatic to bone [facility]) -Per oncologist      Subjective: Seen prior to cystoscopy this AM.  Complained of groin pain  Physical Exam: Vitals:   03/21/22 1230 03/21/22 1245 03/21/22 1302 03/21/22 1628  BP: 127/61 132/61 (!) 148/61 135/61  Pulse: 84 84 91 91  Resp: '13 13 15 17  '$ Temp:  98.8 F (37.1 C) 98.8 F (37.1 C) 98.6 F (37 C)  TempSrc:   Oral Oral  SpO2: 91% 98% 100% 100%  Weight:      Height:       General exam: Awake, laying in bed, in nad Respiratory system: Normal respiratory effort, no wheezing Cardiovascular system: regular rate, s1, s2 Gastrointestinal system: Soft, nondistended, positive BS Central nervous system: CN2-12 grossly intact, strength intact Extremities: Perfused, no clubbing Skin: Normal skin turgor, no notable skin lesions seen, penile crown swollen Psychiatry: Mood normal // no visual hallucinations   Data Reviewed:  Labs reviewed: Na 136, K 4.4, Cr 1.01, Hgb 8.1  Family Communication: Pt in room, family at bedside  Disposition: Status is: Inpatient Remains inpatient appropriate because: Severity of illness  Planned Discharge Destination: Home     Author: Marylu Lund, MD 03/21/2022 4:50 PM  For on call review [email or web address].

## 2022-03-21 NOTE — Consult Note (Signed)
Day of Surgery Subjective: CT imaging overnight with moderate clot burden within the bladder. Patient is afebrile and hemodynamically stable.  Hemoglobin 8.1 this morning, creatinine 1.01. Patient reports having difficulty passing some clots via catheter overnight.  Urine this morning is cherry red.  Attempted to irrigate catheter without any clot return this morning.  Objective: Vital signs in last 24 hours: Temp:  [98.2 F (36.8 C)-98.6 F (37 C)] 98.2 F (36.8 C) (10/07 0808) Pulse Rate:  [92-116] 94 (10/07 1000) Resp:  [14-16] 14 (10/07 0808) BP: (105-187)/(60-114) 111/60 (10/07 0845) SpO2:  [95 %-100 %] 100 % (10/07 1000) Weight:  [109.3 kg] 109.3 kg (10/06 1946)  Intake/Output from previous day: 10/06 0701 - 10/07 0700 In: -  Out: 1000 [Urine:1000] Intake/Output this shift: No intake/output data recorded.  Physical Exam:  General: Alert and oriented CV: Regular rate Lungs: Normal work of breathing Abdomen: Soft, ND GU: Foley catheter in place, cherry red urine in catheter tubing, unable to irrigate well Ext: NT, No erythema  Lab Results: Recent Labs    03/20/22 2109 03/20/22 2117 03/21/22 0432  HGB 7.8* 7.8* 8.1*  HCT 23.0* 23.0* 23.6*   BMET Recent Labs    03/20/22 2117 03/21/22 0432  NA 134* 136  K 4.1 4.4  CL 104 103  CO2 19* 23  GLUCOSE 109* 115*  BUN 16 16  CREATININE 1.20 1.01  CALCIUM 8.8* 8.9     Studies/Results: CT ABDOMEN PELVIS W WO CONTRAST  Result Date: 03/21/2022 CLINICAL DATA:  Hematuria, urinary retention. History of prostate cancer. EXAM: CT ABDOMEN AND PELVIS WITHOUT AND WITH CONTRAST TECHNIQUE: Multidetector CT imaging of the abdomen and pelvis was performed following the standard protocol before and following the bolus administration of intravenous contrast. RADIATION DOSE REDUCTION: This exam was performed according to the departmental dose-optimization program which includes automated exposure control, adjustment of the mA  and/or kV according to patient size and/or use of iterative reconstruction technique. CONTRAST:  144m OMNIPAQUE IOHEXOL 300 MG/ML  SOLN COMPARISON:  PET-CT dated 07/09/2021 FINDINGS: Lower chest: Subpleural patchy opacity in the posterior right lower lobe (series 2/image 15), possibly reflecting post infectious/inflammatory scarring, new from the prior. Hepatobiliary: Unenhanced liver is unremarkable. Gallbladder is unremarkable. No intrahepatic or extrahepatic duct dilatation. Pancreas: Within normal limits. Spleen: Within normal limits. Adrenals/Urinary Tract: Adrenal glands are within normal limits. Kidneys are within normal limits. No renal, ureteral, or bladder calculi. No hydronephrosis. Bladder is notable for moderate intraluminal hemorrhage (series 3/image 99) indwelling Foley catheter. Trace nondependent gas. Stomach/Bowel: Stomach is within normal limits. No evidence of bowel obstruction. Normal appendix (series 3/image 84). No colonic wall thickening or inflammatory changes. Vascular/Lymphatic: No evidence of abdominal aortic aneurysm. Atherosclerotic calcifications of the abdominal aorta and branch vessels. No suspicious abdominopelvic lymphadenopathy. Reproductive: Status post prostatectomy. Other: No abdominopelvic ascites. Mild diastasis of the midline supraumbilical abdominal wall (series 3/image 43). Musculoskeletal: Mild degenerative changes of the lumbar spine. IMPRESSION: Moderate intraluminal hemorrhage in the bladder. Indwelling Foley catheter. Status post prostatectomy. No evidence of recurrent or metastatic disease. Additional ancillary findings as above. Electronically Signed   By: SJulian HyM.D.   On: 03/21/2022 02:07    Assessment/Plan: Castrate sensitive metastatic prostate cancer status post radiation therapy, ADT, s/p RALP in 2017 with Dr. 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  Plan to take patient for cystourethroscopy, clot evacuation, fulguration, possible catheter placement this morning.  Risks and benefits discussed in detail.  He is in agreement with plan.   LOS: 1 day   Jerri Hargadon Rockwell Zentz 03/21/2022, 10:45 AM

## 2022-03-21 NOTE — ED Notes (Signed)
Pt had monitor turned completely off, pt was told not to touch the monitor.

## 2022-03-21 NOTE — Hospital Course (Signed)
56 year old male with history of prostate cancer with mets.  The approximately 5 months ago he underwent radiation therapy and is currently maintained on Xtandi.  Since his radiation therapy, the patient has been doing well.  Two weeks ago he developed hematuria.  He came to the ER where Foley was placed.  He was discharged with the foley in place.  The next day at home the foley clotted off and he removed it.  He passed a handful of clots but has been urinating successfully since.  Today he again developed hematuria with clots.  He had abdominal pain all day.  He denies fever, chills, nausea, vomiting or burning urination.  He came to the ER.  In the ER at 45 French catheter was placed which clotted off, a 22 three-way French catheter was placed which also clotted and was removed.  The hospitalist service has been asked admit.  Urology has been consulted

## 2022-03-21 NOTE — Anesthesia Preprocedure Evaluation (Addendum)
Anesthesia Evaluation  Patient identified by MRN, date of birth, ID band Patient awake    Reviewed: Allergy & Precautions, NPO status , Patient's Chart, lab work & pertinent test results  History of Anesthesia Complications Negative for: history of anesthetic complications  Airway Mallampati: I  TM Distance: >3 FB Neck ROM: Full    Dental  (+) Dental Advisory Given, Teeth Intact   Pulmonary asthma , COPD (last inhaler use yesterday),  COPD inhaler, former smoker,    breath sounds clear to auscultation       Cardiovascular hypertension, Pt. on medications (-) angina Rhythm:Regular Rate:Normal     Neuro/Psych negative neurological ROS     GI/Hepatic negative GI ROS, Neg liver ROS,   Endo/Other  diabetes (glu 93), Insulin DependentBMI 34.6  Renal/GU negative Renal ROS   Prostate cancer    Musculoskeletal   Abdominal (+) + obese,   Peds  Hematology  (+) Blood dyscrasia (Hb 8.1), anemia ,   Anesthesia Other Findings   Reproductive/Obstetrics                            Anesthesia Physical Anesthesia Plan  ASA: 3  Anesthesia Plan: General   Post-op Pain Management: Ofirmev IV (intra-op)*   Induction: Intravenous  PONV Risk Score and Plan: 2 and Ondansetron and Dexamethasone  Airway Management Planned: LMA  Additional Equipment: None  Intra-op Plan:   Post-operative Plan:   Informed Consent: I have reviewed the patients History and Physical, chart, labs and discussed the procedure including the risks, benefits and alternatives for the proposed anesthesia with the patient or authorized representative who has indicated his/her understanding and acceptance.     Dental advisory given  Plan Discussed with: CRNA and Surgeon  Anesthesia Plan Comments:        Anesthesia Quick Evaluation

## 2022-03-21 NOTE — Transfer of Care (Signed)
Immediate Anesthesia Transfer of Care Note  Patient: [name]  Procedure(s) Performed: CYSTOSCOPY WITH FULGERATION (Urethra)  Patient Location: PACU  Anesthesia Type:General  Level of Consciousness: awake and patient cooperative  Airway & Oxygen Therapy: Patient Spontanous Breathing and Patient connected to face mask oxygen  Post-op Assessment: Report given to RN and Post -op Vital signs reviewed and stable  Post vital signs: Reviewed and stable  Last Vitals:  Vitals Value Taken Time  BP 121/69 03/21/22 1145  Temp 36.2 C 03/21/22 1145  Pulse 93 03/21/22 1154  Resp 13 03/21/22 1154  SpO2 99 % 03/21/22 1154  Vitals shown include unvalidated device data.  Last Pain:  Vitals:   03/21/22 0808  TempSrc: Oral  PainSc:          Complications: No notable events documented.

## 2022-03-21 NOTE — ED Notes (Signed)
Pt wife back in room. Recliner provided for her.

## 2022-03-21 NOTE — Plan of Care (Signed)
  Problem: Education: Goal: Knowledge of General Education information will improve Description: Including pain rating scale, medication(s)/side effects and non-pharmacologic comfort measures Outcome: Progressing   Problem: Elimination: Goal: Will not experience complications related to urinary retention Outcome: Progressing   

## 2022-03-21 NOTE — Anesthesia Postprocedure Evaluation (Signed)
Anesthesia Post Note  Patient: [name]  Procedure(s) Performed: CYSTOSCOPY WITH FULGERATION (Urethra)     Patient location during evaluation: PACU Anesthesia Type: General Level of consciousness: awake and alert, patient cooperative and oriented Pain management: pain level controlled (pain improving) Vital Signs Assessment: post-procedure vital signs reviewed and stable Respiratory status: spontaneous breathing, nonlabored ventilation and respiratory function stable Cardiovascular status: blood pressure returned to baseline and stable Postop Assessment: no apparent nausea or vomiting Anesthetic complications: no   No notable events documented.  Last Vitals:  Vitals:   03/21/22 1145 03/21/22 1200  BP: 121/69 132/66  Pulse: 96 88  Resp: 18 13  Temp: (!) 36.2 C   SpO2: 100% 99%    Last Pain:  Vitals:   03/21/22 1145  TempSrc:   PainSc: 7                  Daisy Lites,E. Jozy Mcphearson

## 2022-03-21 NOTE — Anesthesia Procedure Notes (Signed)
Procedure Name: LMA Insertion Date/Time: 03/21/2022 11:07 AM  Performed by: Renato Shin, CRNAPre-anesthesia Checklist: Patient identified, Emergency Drugs available, Suction available and Patient being monitored Patient Re-evaluated:Patient Re-evaluated prior to induction Oxygen Delivery Method: Circle system utilized Preoxygenation: Pre-oxygenation with 100% oxygen Induction Type: IV induction Ventilation: Mask ventilation without difficulty LMA: LMA inserted LMA Size: 4.0 Number of attempts: 1 Placement Confirmation: positive ETCO2 and breath sounds checked- equal and bilateral Tube secured with: Tape Dental Injury: Teeth and Oropharynx as per pre-operative assessment

## 2022-03-21 NOTE — Op Note (Addendum)
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  Cystourethroscopy notable for cystitis changes of the bladder mucosa.  Active bleeding from trigone and posterior bladder wall which was fulgurated without issue.  Bilateral ureteral orifices orthotopic and uninvolved  EBL: Minimal  Specimens: None  Indication: [name] is a 56 y.o. 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  Foley catheter was placed but was unable to be irrigated despite upsizing to 24Fr hematuria catheter.  He presents for cystourethroscopy, clot evacuation with fulguration.  After reviewing the management options for treatment, he elected to proceed with the above surgical procedure(s). We have discussed the potential benefits and risks of the procedure, side effects of the proposed treatment, the likelihood of the patient achieving the goals of the procedure, and any potential problems that might occur during the procedure or recuperation. Informed consent has been obtained.  Description of procedure:  The patient was taken to the operating room and general anesthesia was induced.  The patient was placed in the dorsal lithotomy position, prepped and draped in the usual sterile fashion, and preoperative antibiotics were administered. A preoperative time-out was performed.   Cystourethroscopy was performed.  There was a moderate amount of clot burden within the lumen of  the bladder which was evacuated using a Toomey syringe.  The patient's prostate was surgically absent.  The bladder mucosa was significant for diffuse cystitis changes.  There were no tumors or stones appreciated.  Bilateral ureteral orifices were orthotopic and were uninvolved.  There was active oozing from the trigone and posterior bladder wall which was fulgurated using the bipolar electrocautery loop.  Excellent hemostasis was achieved.  Due to this and not wanting to restart bleeding, a catheter was not placed at the end of the case.    The bladder was then emptied and the procedure ended.  The patient appeared to tolerate the procedure well and without complications.  The patient was able to be awakened and transferred to the recovery unit in satisfactory condition. Dr. Abner Greenspan was present and scrubbed for the entirety of the case.   Plan: -Recommend observation for another 24 hours to ensure patient is voiding without difficulty -Urology will continue to follow  [facility]  Pager: [phone]

## 2022-03-22 ENCOUNTER — Encounter (HOSPITAL_COMMUNITY): Payer: Self-pay | Admitting: Urology

## 2022-03-22 ENCOUNTER — Inpatient Hospital Stay (HOSPITAL_COMMUNITY): Payer: BC Managed Care – PPO

## 2022-03-22 ENCOUNTER — Encounter (HOSPITAL_COMMUNITY)
Admission: EM | Disposition: A | Discharge status: Home / Self Care | Payer: Self-pay | Source: Home / Self Care | Attending: Internal Medicine

## 2022-03-22 DIAGNOSIS — R310 Gross hematuria: Secondary | ICD-10-CM | POA: Diagnosis not present

## 2022-03-22 DIAGNOSIS — R338 Other retention of urine: Secondary | ICD-10-CM | POA: Diagnosis not present

## 2022-03-22 DIAGNOSIS — I10 Essential (primary) hypertension: Secondary | ICD-10-CM | POA: Diagnosis not present

## 2022-03-22 LAB — GLUCOSE, CAPILLARY
Glucose-Capillary: 79 mg/dL (ref 70–99)
Glucose-Capillary: 95 mg/dL (ref 70–99)
Glucose-Capillary: 182 mg/dL — ABNORMAL HIGH (ref 70–99)
Glucose-Capillary: 157 mg/dL — ABNORMAL HIGH (ref 70–99)

## 2022-03-22 LAB — COMPREHENSIVE METABOLIC PANEL
ALT: 19 U/L (ref 0–44)
AST: 23 U/L (ref 15–41)
Albumin: 3 g/dL — ABNORMAL LOW (ref 3.5–5.0)
Alkaline Phosphatase: 53 U/L (ref 38–126)
Anion gap: 5 (ref 5–15)
BUN: 21 mg/dL — ABNORMAL HIGH (ref 6–20)
CO2: 26 mmol/L (ref 22–32)
Calcium: 8.3 mg/dL — ABNORMAL LOW (ref 8.9–10.3)
Chloride: 106 mmol/L (ref 98–111)
Creatinine, Ser: 1.14 mg/dL (ref 0.61–1.24)
GFR, Estimated: >60 mL/min (ref >60)
Glucose, Bld: 233 mg/dL — ABNORMAL HIGH (ref 70–99)
Potassium: 4.1 mmol/L (ref 3.5–5.1)
Sodium: 137 mmol/L (ref 135–145)
Total Bilirubin: 0.4 mg/dL (ref 0.3–1.2)
Total Protein: 5.4 g/dL — ABNORMAL LOW (ref 6.5–8.1)

## 2022-03-22 LAB — URINE CULTURE: Culture: NO GROWTH

## 2022-03-22 LAB — CBC
HCT: 19.6 % — ABNORMAL LOW (ref 39.0–52.0)
Hemoglobin: 6.5 g/dL — CL (ref 13.0–17.0)
MCH: 32.3 pg (ref 26.0–34.0)
MCHC: 33.2 g/dL (ref 30.0–36.0)
MCV: 97.5 fL (ref 80.0–100.0)
Platelets: 263 10*3/uL (ref 150–400)
RBC: 2.01 MIL/uL — ABNORMAL LOW (ref 4.22–5.81)
RDW: 13 % (ref 11.5–15.5)
WBC: 5.4 10*3/uL (ref 4.0–10.5)
nRBC: 0 % (ref 0.0–0.2)

## 2022-03-22 LAB — HEMOGLOBIN AND HEMATOCRIT, BLOOD
HCT: 22.5 % — ABNORMAL LOW (ref 39.0–52.0)
HCT: 25.3 % — ABNORMAL LOW (ref 39.0–52.0)
Hemoglobin: 7.5 g/dL — ABNORMAL LOW (ref 13.0–17.0)
Hemoglobin: 8.7 g/dL — ABNORMAL LOW (ref 13.0–17.0)

## 2022-03-22 LAB — PREPARE RBC (CROSSMATCH)

## 2022-03-22 SURGERY — CYSTOSCOPY
Anesthesia: General

## 2022-03-22 MED ORDER — SODIUM CHLORIDE 0.9% IV SOLUTION: Freq: Once | INTRAVENOUS | Status: DC

## 2022-03-22 NOTE — Consult Note (Addendum)
1 Day Post-Op Subjective: No acute events overnight. Voiding spontaneously. Notes urine red but clearing up this morning. Passing intermittent dime-sized clots yesterday after procedure. Hgb 6.5 this morning, receiving transfusion. Urine culture no growth.  Objective: Vital signs in last 24 hours: Temp:  [97.2 F (36.2 C)-99.5 F (37.5 C)] 98.7 F (37.1 C) (10/08 0653) Pulse Rate:  [81-116] 81 (10/08 0653) Resp:  [13-18] 17 (10/08 0653) BP: (105-154)/(56-73) 129/61 (10/08 0653) SpO2:  [91 %-100 %] 100 % (10/08 0653)  Intake/Output from previous day: 10/07 0701 - 10/08 0700 In: 920 [P.O.:120; I.V.:500; IV Piggyback:300] Out: 425 [Urine:425] Intake/Output this shift: No intake/output data recorded.  Physical Exam:  General: Alert and oriented CV: Regular rate Lungs: Normal work of breathing Abdomen: Soft, ND GU: Voiding spontaneously Ext: NT, No erythema  Lab Results: Recent Labs    03/21/22 0432 03/21/22 1657 03/22/22 0034  HGB 8.1* 7.8* 6.5*  HCT 23.6* 23.3* 19.6*    BMET Recent Labs    03/21/22 0432 03/22/22 0034  NA 136 137  K 4.4 4.1  CL 103 106  CO2 23 26  GLUCOSE 115* 233*  BUN 16 21*  CREATININE 1.01 1.14  CALCIUM 8.9 8.3*      Studies/Results: CT ABDOMEN PELVIS W WO CONTRAST  Result Date: 03/21/2022 CLINICAL DATA:  Hematuria, urinary retention. History of prostate cancer. EXAM: CT ABDOMEN AND PELVIS WITHOUT AND WITH CONTRAST TECHNIQUE: Multidetector CT imaging of the abdomen and pelvis was performed following the standard protocol before and following the bolus administration of intravenous contrast. RADIATION DOSE REDUCTION: This exam was performed according to the departmental dose-optimization program which includes automated exposure control, adjustment of the mA and/or kV according to patient size and/or use of iterative reconstruction technique. CONTRAST:  143m OMNIPAQUE IOHEXOL 300 MG/ML  SOLN COMPARISON:  PET-CT dated 07/09/2021 FINDINGS:  Lower chest: Subpleural patchy opacity in the posterior right lower lobe (series 2/image 15), possibly reflecting post infectious/inflammatory scarring, new from the prior. Hepatobiliary: Unenhanced liver is unremarkable. Gallbladder is unremarkable. No intrahepatic or extrahepatic duct dilatation. Pancreas: Within normal limits. Spleen: Within normal limits. Adrenals/Urinary Tract: Adrenal glands are within normal limits. Kidneys are within normal limits. No renal, ureteral, or bladder calculi. No hydronephrosis. Bladder is notable for moderate intraluminal hemorrhage (series 3/image 99) indwelling Foley catheter. Trace nondependent gas. Stomach/Bowel: Stomach is within normal limits. No evidence of bowel obstruction. Normal appendix (series 3/image 84). No colonic wall thickening or inflammatory changes. Vascular/Lymphatic: No evidence of abdominal aortic aneurysm. Atherosclerotic calcifications of the abdominal aorta and branch vessels. No suspicious abdominopelvic lymphadenopathy. Reproductive: Status post prostatectomy. Other: No abdominopelvic ascites. Mild diastasis of the midline supraumbilical abdominal wall (series 3/image 43). Musculoskeletal: Mild degenerative changes of the lumbar spine. IMPRESSION: Moderate intraluminal hemorrhage in the bladder. Indwelling Foley catheter. Status post prostatectomy. No evidence of recurrent or metastatic disease. Additional ancillary findings as above. Electronically Signed   By: SJulian HyM.D.   On: 03/21/2022 02:07    Assessment/Plan: Castrate sensitive metastatic prostate cancer status post radiation therapy, ADT, s/p RALP in 2017 with Dr. 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 Urine clearing although passing some clots after procedure Acute blood loss anemia, hemoglobin  6.5 this morning, receiving transfusion  -Will re-check urine this morning to ensure urine is improving. If not improving and still passing clots, may need to place  catheter to evacuate clot burden. - Transfusions per primary team.    LOS: 2 days   [name] 03/22/2022, 7:58 AM  I have seen and examined the patient and agree with the above assessment and plan.  S/p clot evac yesterday with clear yellow urine at end of case so no catheter was left at could be causing irritation of bladder consistent with radiation cystitis. Pt states passing "more clear than bloody urine" however did note a few dime sized clots.  -Pt void in urinal this AM and will re-evaluate to see if we need to replace foley catheter. -Receiving pRBC this AM for hgb 6.5 from 7.8. -Check bladder ultrasound to ensure no large clot burden. -Would likely benefit from outpatient HBO2 given radiation cystitis  [name]  Pager: [phone]

## 2022-03-22 NOTE — Progress Notes (Signed)
Urine is clear yellow. Pelvic u/s with <1cm probable clot. No need for catheter at this time. Hopefully he can discharge home tomorrow.  [facility]  Pager: [phone]

## 2022-03-22 NOTE — Plan of Care (Signed)
Plan of care reviewed and dicussed with the patient.

## 2022-03-22 NOTE — Progress Notes (Signed)
  Progress Note   Patient: [name] YCX:[id number] DOB: 1965/10/07 DOA: 03/20/2022     2 DOS: the patient was seen and examined on 03/22/2022   Brief hospital course: 56 year old male with history of prostate cancer with mets.  The approximately 5 months ago he underwent radiation therapy and is currently maintained on Xtandi.  Since his radiation therapy, the patient has been doing well.  Two weeks ago he developed hematuria.  He came to the ER where Foley was placed.  He was discharged with the foley in place.  The next day at home the foley clotted off and he removed it.  He passed a handful of clots but has been urinating successfully since.  Today he again developed hematuria with clots.  He had abdominal pain all day.  He denies fever, chills, nausea, vomiting or burning urination.  He came to the ER.  In the ER at 18 French catheter was placed which clotted off, a 22 three-way French catheter was placed which also clotted and was removed.  The hospitalist service has been asked admit.  Urology has been consulted   Assessment and Plan:  Hematuria with clots/acute urinary retention -Urology consulted and following -SCDs on for DVT prophylaxis -This AM, Urology was unable to irrigate cath with CT findings of mod clot burden. Pt now s/p cystoscopy with clot evacuation and fulguration on 10/7 -Hgb down to 6.5 this AM, now s/p 1 unit PRBC with appropriate correction in hgb -Urology recs for bladder US to ensure no large clot burden. Urology to re-eval to see if pt needs to have catheter replaced -recheck bmet and CBC in AM   Acute anemia, secondary to acute hemorrhage, acute blood loss anemia -Baseline hemoglobin 12.9 -Hgb down to 6.5, responded appropriately with 1 unit PRBC -Recheck cbc in AM   Hypertension uncontrolled -Likely related to pain -BP currently stable -Cont PRN bp meds as needed   Dyslipidemia -Stable resume Crestor   Borderline diabetes mellitus -Sliding scale  insulin -glycemic trends stable    Prostate cancer ([place])  Malignant neoplasm of prostate metastatic to bone [facility]) -Per oncologist      Subjective: Pt reports still passing clots in urine  Physical Exam: Vitals:   03/22/22 0653 03/22/22 0940 03/22/22 1056 03/22/22 1332  BP: 129/61 (!) 157/82 (!) 145/68 (!) 148/88  Pulse: 81 89 84 94  Resp: '17 16 16 16  '$ Temp: 98.7 F (37.1 C) 98.6 F (37 C) 98.1 F (36.7 C) 98.1 F (36.7 C)  TempSrc: Oral Oral Oral   SpO2: 100% 100% 100% 100%  Weight:      Height:       General exam: Conversant, in no acute distress Respiratory system: normal chest rise, clear, no audible wheezing Cardiovascular system: regular rhythm, s1-s2 Gastrointestinal system: Nondistended, nontender, pos BS Central nervous system: No seizures, no tremors Extremities: No cyanosis, no joint deformities Skin: No rashes, no pallor Psychiatry: Affect normal // no auditory hallucinations   Data Reviewed:  Labs reviewed: Na 137, K 4.1, Cr 1.14, Hgb 7.5  Family Communication: Pt in room, family not at bedside  Disposition: Status is: Inpatient Remains inpatient appropriate because: Severity of illness  Planned Discharge Destination: Home     Author: Marylu Lund, MD 03/22/2022 2:30 PM  For on call review [email or web address].

## 2022-03-23 LAB — BPAM RBC
Blood Product Expiration Date: 202311082359
ISSUE DATE / TIME: 202310080621
Unit Type and Rh: 5100

## 2022-03-23 LAB — COMPREHENSIVE METABOLIC PANEL
ALT: 20 U/L (ref 0–44)
AST: 22 U/L (ref 15–41)
Albumin: 3.1 g/dL — ABNORMAL LOW (ref 3.5–5.0)
Alkaline Phosphatase: 48 U/L (ref 38–126)
Anion gap: 6 (ref 5–15)
BUN: 18 mg/dL (ref 6–20)
CO2: 25 mmol/L (ref 22–32)
Calcium: 8.2 mg/dL — ABNORMAL LOW (ref 8.9–10.3)
Chloride: 106 mmol/L (ref 98–111)
Creatinine, Ser: 1.2 mg/dL (ref 0.61–1.24)
GFR, Estimated: >60 mL/min (ref >60)
Glucose, Bld: 191 mg/dL — ABNORMAL HIGH (ref 70–99)
Potassium: 4.1 mmol/L (ref 3.5–5.1)
Sodium: 137 mmol/L (ref 135–145)
Total Bilirubin: 0.4 mg/dL (ref 0.3–1.2)
Total Protein: 5.6 g/dL — ABNORMAL LOW (ref 6.5–8.1)

## 2022-03-23 LAB — TYPE AND SCREEN
ABO/RH(D): O POS
Antibody Screen: NEGATIVE
Unit division: 0

## 2022-03-23 LAB — CBC
HCT: 22.5 % — ABNORMAL LOW (ref 39.0–52.0)
Hemoglobin: 7.6 g/dL — ABNORMAL LOW (ref 13.0–17.0)
MCH: 31.8 pg (ref 26.0–34.0)
MCHC: 33.8 g/dL (ref 30.0–36.0)
MCV: 94.1 fL (ref 80.0–100.0)
Platelets: 257 10*3/uL (ref 150–400)
RBC: 2.39 MIL/uL — ABNORMAL LOW (ref 4.22–5.81)
RDW: 14.7 % (ref 11.5–15.5)
WBC: 4.7 10*3/uL (ref 4.0–10.5)
nRBC: 0 % (ref 0.0–0.2)

## 2022-03-23 LAB — GLUCOSE, CAPILLARY: Glucose-Capillary: 156 mg/dL — ABNORMAL HIGH (ref 70–99)

## 2022-03-23 LAB — HEMOGLOBIN AND HEMATOCRIT, BLOOD
HCT: 23.7 % — ABNORMAL LOW (ref 39.0–52.0)
Hemoglobin: 7.8 g/dL — ABNORMAL LOW (ref 13.0–17.0)

## 2022-03-23 NOTE — Progress Notes (Signed)
Mobility Specialist - Progress Note   03/23/22 1052  Mobility  Activity Ambulated independently in hallway  Activity Response Tolerated well  Distance Ambulated (ft) 500 ft  $Mobility charge 1 Mobility  Level of Assistance Independent  Assistive Device None  HOB Elevated/Bed Position Self regulated  Range of Motion/Exercises Active  Transport method Ambulatory  Mobility Referral Yes   Pt received in room and agreeable to mobility.C/o of pain in genitalia area. Nurse notified. Pt to room after session with all needs met & wife in room.     [facility]

## 2022-03-23 NOTE — Consult Note (Signed)
2 Days Post-Op Subjective: No acute events overnight. Voiding spontaneously. Urine remains clear yellow. Denies passing clots. Hgb 7.6 this morning after transfusion yesterday.  Objective: Vital signs in last 24 hours: Temp:  [97.9 F (36.6 C)-98.6 F (37 C)] 98.2 F (36.8 C) (10/09 0439) Pulse Rate:  [84-94] 87 (10/09 0439) Resp:  [16-18] 17 (10/09 0439) BP: (130-157)/(60-88) 130/76 (10/09 0439) SpO2:  [99 %-100 %] 100 % (10/09 0439)  Intake/Output from previous day: 10/08 0701 - 10/09 0700 In: 550 [Blood:550] Out: 700 [Urine:700] Intake/Output this shift: No intake/output data recorded.  Physical Exam:  General: Alert and oriented CV: Regular rate Lungs: Normal work of breathing Abdomen: Soft, ND GU: Voiding spontaneously Ext: NT, No erythema  Lab Results: Recent Labs    03/22/22 1121 03/22/22 2006 03/23/22 0045  HGB 7.5* 8.7* 7.6*  HCT 22.5* 25.3* 22.5*    BMET Recent Labs    03/22/22 0034 03/23/22 0045  NA 137 137  K 4.1 4.1  CL 106 106  CO2 26 25  GLUCOSE 233* 191*  BUN 21* 18  CREATININE 1.14 1.20  CALCIUM 8.3* 8.2*      Studies/Results: US PELVIS LIMITED (TRANSABDOMINAL ONLY)  Result Date: 03/22/2022 CLINICAL DATA:  Assess for clot burden within bladder. EXAM: LIMITED ULTRASOUND OF PELVIS TECHNIQUE: Limited transabdominal ultrasound examination of the pelvis was performed. COMPARISON:  CT, 03/21/2022. FINDINGS: Bladder is minimally distended. There is hyperechoic material in the bladder with adjacent hypoechoic material. Hyperechoic material suggests a stone, but may be dense thrombus. Bladder wall appears irregularly thickened, but difficult to assess in the mostly decompressed state. Hyperechoic area measures 8 x 6 x 7 mm. IMPRESSION: 1. Small hyperechoic area within the dependent bladder which could reflect a stone. However, there was no radiopaque stone evident on the previous day's CT. This material is more likely dense thrombus. Clot burden,  compared to the CT, is significantly decreased. Electronically Signed   By: Lajean Manes M.D.   On: 03/22/2022 12:10    Assessment/Plan: Castrate sensitive metastatic prostate cancer status post radiation therapy, ADT, s/p RALP in 2017 with Dr. 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  Acute blood loss anemia, hemoglobin 6.5 this morning, s/p 1u pRBC 10/8  - Gross hematuria resolved and Hgb stable  - Patient will likely benefit from outpatient HBO2 given radiation cystitis.  - We will set up follow up for patient.   LOS: 3 days   Romain Erion A[place] 03/23/2022, 7:30 AM

## 2022-03-23 NOTE — TOC Progression Note (Signed)
  Transition of Care [facility]) Screening Note   Patient Details  Name: [name] Date of Birth: 1966-05-30   Transition of Care [facility]) CM/SW Contact:    Ross Ludwig, LCSW Phone Number: 03/23/2022, 2:29 PM    Transition of Care Department [facility]) has reviewed patient and no TOC needs have been identified at this time. We will continue to monitor patient advancement through interdisciplinary progression rounds. If new patient transition needs arise, please place a TOC consult.

## 2022-03-23 NOTE — Discharge Summary (Signed)
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 *  Hospital Course: 56 year old male with history of prostate cancer with mets.  The approximately 5 months ago he underwent radiation therapy and is currently maintained on Xtandi.  Since his radiation therapy, the patient has been doing well.  Two weeks ago he developed hematuria.  He came to the ER where Foley was placed.  He was discharged with the foley in place.  The next day at home the foley clotted off and he removed it.  He passed a handful of clots but has been urinating successfully since.  Today he again developed hematuria with clots.  He had abdominal pain all day.  He denies fever, chills, nausea, vomiting or burning urination.  He came to the ER.  In the ER at 94 French catheter was placed which clotted off, a 22 three-way French catheter was placed which also clotted and was removed.  The hospitalist service has been asked admit.  Urology has been consulted   Assessment and Plan:  Hematuria with clots/acute urinary retention -Urology consulted and following -SCDs on for DVT prophylaxis -This AM, Urology was unable to irrigate cath with CT findings of mod clot burden. Pt now s/p cystoscopy with clot evacuation and fulguration on 10/7 -Hgb down to 6.5 on 10/8 AM, now s/p 1 unit PRBC with appropriate correction in hgb -Pt voiding and remained without cath. Pt to follow up closely with Urology as  outpatient   Acute anemia, secondary to acute hemorrhage, acute blood loss anemia -Baseline hemoglobin 12.9 -Hgb down to 6.5, responded appropriately with 1 unit PRBC -Recheck cbc in AM   Hypertension uncontrolled -Likely related to pain -BP remained stable   Dyslipidemia -Stable resume Crestor   Borderline diabetes mellitus -Sliding scale insulin -glycemic trends stable    Prostate cancer ([place])  Malignant neoplasm of prostate metastatic to bone [facility]) -Per oncologist       Consultants: Urology Procedures performed: Cystoscopy  Disposition: Home Diet recommendation:  Carb modified diet DISCHARGE MEDICATION: Allergies as of 03/23/2022   No Known Allergies      Medication List     STOP taking these medications    cephALEXin 500 MG capsule Commonly known as: KEFLEX       TAKE these medications    ALPRAZolam 0.25 MG tablet Commonly known as: XANAX Take 0.25 mg by mouth 3 (three) times daily as needed.   amLODipine 5 MG tablet Commonly known as: NORVASC Take 5 mg by mouth daily.   B-D INS SYRINGE 0.5CC/31GX5/16 31G X 5/16" 0.5 ML Misc Generic drug: Insulin Syringe-Needle U-100 use 1 syringe as directed (does 5 injections a day   B-D UF III MINI PEN NEEDLES 31G X 5 MM Misc Generic drug: Insulin Pen Needle USE TO INJECT INSULIN 4 TIMES DAILY E10.9   FreeStyle Libre 2 Sensor Misc USE TO MONITOR CBG EVERY 14 DAYS AS DIRECTED   Lyumjev Tempo Pen 100 UNIT/ML  Sopn Generic drug: Insulin Lispro-aabc w/TranPort Inject into the skin.   ramipril 10 MG capsule Commonly known as: ALTACE Take 10 mg by mouth daily.   rosuvastatin 5 MG tablet Commonly known as: CRESTOR Take 5 mg by mouth. Twice a week   Tresiba FlexTouch 200 UNIT/ML FlexTouch Pen Generic drug: insulin degludec Inject 36 Units into the skin every morning.   Xtandi 40 MG tablet Generic drug: enzalutamide Take 160 mg by mouth at bedtime.        Discharge Exam: Filed Weights    03/20/22 1946  Weight: 109.3 kg   General exam: Awake, laying in bed, in nad Respiratory system: Normal respiratory effort, no wheezing Cardiovascular system: regular rate, s1, s2 Gastrointestinal system: Soft, nondistended, positive BS Central nervous system: CN2-12 grossly intact, strength intact Extremities: Perfused, no clubbing Skin: Normal skin turgor, no notable skin lesions seen Psychiatry: Mood normal // no visual hallucinations   Condition at discharge: fair  The results of significant diagnostics from this hospitalization (including imaging, microbiology, ancillary and laboratory) are listed below for reference.   Imaging Studies: US PELVIS LIMITED (TRANSABDOMINAL ONLY)  Result Date: 03/22/2022 CLINICAL DATA:  Assess for clot burden within bladder. EXAM: LIMITED ULTRASOUND OF PELVIS TECHNIQUE: Limited transabdominal ultrasound examination of the pelvis was performed. COMPARISON:  CT, 03/21/2022. FINDINGS: Bladder is minimally distended. There is hyperechoic material in the bladder with adjacent hypoechoic material. Hyperechoic material suggests a stone, but may be dense thrombus. Bladder wall appears irregularly thickened, but difficult to assess in the mostly decompressed state. Hyperechoic area measures 8 x 6 x 7 mm. IMPRESSION: 1. Small hyperechoic area within the dependent bladder which could reflect a stone. However, there was no radiopaque stone evident on the previous day's CT. This material is more likely dense thrombus. Clot burden, compared to the CT, is significantly decreased. Electronically Signed   By: Lajean Manes M.D.   On: 03/22/2022 12:10   CT ABDOMEN PELVIS W WO CONTRAST  Result Date: 03/21/2022 CLINICAL DATA:  Hematuria, urinary retention. History of prostate cancer. EXAM: CT ABDOMEN AND PELVIS WITHOUT AND WITH CONTRAST TECHNIQUE: Multidetector CT imaging of the abdomen and pelvis was performed following the standard protocol before and following the bolus  administration of intravenous contrast. RADIATION DOSE REDUCTION: This exam was performed according to the departmental dose-optimization program which includes automated exposure control, adjustment of the mA and/or kV according to patient size and/or use of iterative reconstruction technique. CONTRAST:  143m OMNIPAQUE IOHEXOL 300 MG/ML  SOLN COMPARISON:  PET-CT dated 07/09/2021 FINDINGS: Lower chest: Subpleural patchy opacity in the posterior right lower lobe (series 2/image 15), possibly reflecting post infectious/inflammatory scarring, new from the prior. Hepatobiliary: Unenhanced liver is unremarkable. Gallbladder is unremarkable. No intrahepatic or extrahepatic duct dilatation. Pancreas: Within normal limits. Spleen: Within normal limits. Adrenals/Urinary Tract: Adrenal glands are within normal limits. Kidneys are within normal limits. No renal, ureteral, or bladder calculi. No hydronephrosis. Bladder is notable for moderate intraluminal hemorrhage (series 3/image 99) indwelling Foley catheter. Trace nondependent gas. Stomach/Bowel: Stomach is within normal limits. No evidence of bowel obstruction. Normal appendix (series 3/image 84). No colonic wall thickening or inflammatory changes. Vascular/Lymphatic: No evidence of abdominal aortic aneurysm. Atherosclerotic calcifications of the abdominal aorta and branch vessels. No suspicious abdominopelvic lymphadenopathy. Reproductive: Status post prostatectomy. Other: No abdominopelvic ascites. Mild diastasis of the midline supraumbilical abdominal wall (series 3/image 43). Musculoskeletal: Mild degenerative changes of the lumbar spine. IMPRESSION: Moderate intraluminal hemorrhage in the bladder. Indwelling Foley catheter. Status post prostatectomy. No  evidence of recurrent or metastatic disease. Additional ancillary findings as above. Electronically Signed   By: Julian Hy M.D.   On: 03/21/2022 02:07    Microbiology: Results for orders placed or performed  during the hospital encounter of 03/20/22  Urine Culture     Status: None   Collection Time: 03/20/22  8:07 PM   Specimen: Urine, Clean Catch  Result Value Ref Range Status   Specimen Description   Final    URINE, CLEAN CATCH Performed at [facility], [place] [address], [place], [place] [address]    Special Requests   Final    NONE Performed at [facility], [place] [address] [place], [place] [address]    Culture   Final    NO GROWTH Performed at [facility], [place] [address], [place],  [address]    Report Status 03/22/2022 FINAL  Final    Labs: CBC: Recent Labs  Lab 03/20/22 2117 03/21/22 0432 03/22/22 0034 03/22/22 1121 03/22/22 2006 03/23/22 0045 03/23/22 0843  WBC 7.0  --  5.4  --   --  4.7  --   NEUTROABS 6.3  --   --   --   --   --   --   HGB 7.8*   < > 6.5* 7.5* 8.7* 7.6* 7.8*  HCT 23.0*   < > 19.6* 22.5* 25.3* 22.5* 23.7*  MCV 96.6  --  97.5  --   --  94.1  --   PLT 392  --  263  --   --  257  --    < > = values in this interval not displayed.   Basic Metabolic Panel: Recent Labs  Lab 03/20/22 2109 03/20/22 2117 03/21/22 0432 03/22/22 0034 03/23/22 0045  NA 134* 134* 136 137 137  K 4.1 4.1 4.4 4.1 4.1  CL 102 104 103 106 106  CO2  --  19* '23 26 25  '$ GLUCOSE 106* 109* 115* 233* 191*  BUN '14 16 16 '$ 21* 18  CREATININE 1.20 1.20 1.01 1.14 1.20  CALCIUM  --  8.8* 8.9 8.3* 8.2*   Liver Function Tests: Recent Labs  Lab 03/22/22 0034 03/23/22 0045  AST 23 22  ALT 19 20  ALKPHOS 53 48  BILITOT 0.4 0.4  PROT 5.4* 5.6*  ALBUMIN 3.0* 3.1*   CBG: Recent Labs  Lab 03/22/22 0714 03/22/22 1140 03/22/22 1646 03/22/22 2139 03/23/22 0751  GLUCAP 79 95 182* 157* 156*    Discharge time spent: less than 30 minutes.  Signed: Marylu Lund, MD [facility] 03/23/2022

## 2022-03-23 NOTE — Plan of Care (Signed)
Plan of care reviewed and discussed with the patient. 

## 2022-03-23 NOTE — Progress Notes (Signed)
Date and time results received: 03/22/2022 0130 (use smartphrase ".now" to insert current time)  Test: Hgb:  Critical Value: 6.5  Name of Provider Notified: Lonia Skinner  Orders Received? Or Actions Taken?: orders received.

## 2022-03-25 ENCOUNTER — Ambulatory Visit (HOSPITAL_BASED_OUTPATIENT_CLINIC_OR_DEPARTMENT_OTHER): Payer: BC Managed Care – PPO | Admitting: Internal Medicine

## 2022-03-31 ENCOUNTER — Other Ambulatory Visit (HOSPITAL_COMMUNITY): Payer: Self-pay | Admitting: General Surgery

## 2022-03-31 ENCOUNTER — Ambulatory Visit (HOSPITAL_COMMUNITY)
Admission: RE | Admit: 2022-03-31 | Discharge: 2022-03-31 | Disposition: A | Discharge status: Home / Self Care | Payer: BC Managed Care – PPO | Source: Ambulatory Visit | Attending: General Surgery | Admitting: General Surgery

## 2022-03-31 ENCOUNTER — Encounter (HOSPITAL_BASED_OUTPATIENT_CLINIC_OR_DEPARTMENT_OTHER): Payer: BC Managed Care – PPO | Attending: Internal Medicine | Admitting: General Surgery

## 2022-03-31 DIAGNOSIS — N3041 Irradiation cystitis with hematuria: Secondary | ICD-10-CM | POA: Insufficient documentation

## 2022-03-31 DIAGNOSIS — Z9289 Personal history of other medical treatment: Secondary | ICD-10-CM

## 2022-03-31 DIAGNOSIS — Z9079 Acquired absence of other genital organ(s): Secondary | ICD-10-CM | POA: Diagnosis not present

## 2022-03-31 DIAGNOSIS — E103529 Type 1 diabetes mellitus with proliferative diabetic retinopathy with traction retinal detachment involving the macula, unspecified eye: Secondary | ICD-10-CM | POA: Diagnosis not present

## 2022-03-31 DIAGNOSIS — C61 Malignant neoplasm of prostate: Secondary | ICD-10-CM | POA: Insufficient documentation

## 2022-03-31 DIAGNOSIS — R339 Retention of urine, unspecified: Secondary | ICD-10-CM | POA: Insufficient documentation

## 2022-03-31 DIAGNOSIS — Z87891 Personal history of nicotine dependence: Secondary | ICD-10-CM | POA: Diagnosis not present

## 2022-03-31 DIAGNOSIS — Z794 Long term (current) use of insulin: Secondary | ICD-10-CM | POA: Insufficient documentation

## 2022-03-31 NOTE — Progress Notes (Signed)
FAMOUS, EISENHARDT ([id number]) [id number] Page 1 of 4 Visit Report for 03/31/2022 Allergy List Details Patient Name: Date of Service: [name] MES E. 03/31/2022 8:00 A M Medical Record Number: [id number] Patient Account Number: [id number] Date of Birth/Sex: Treating RN: 30-May-1966 (56 y.o. [name] Primary Care Gavrielle Streck: [name] Other Clinician: Referring Tito Ausmus: Treating Akshith Moncus/Extender: [name] in Treatment: 0 Allergies Active Allergies No Known Allergies Allergy Notes Electronic Signature(s) Signed: 03/31/2022 4:42:17 PM By: Dellie Catholic RN Entered By: Dellie Catholic on 03/31/2022 08:22:05 -------------------------------------------------------------------------------- Arrival Information Details Patient Name: Date of Service: [name] MES E. 03/31/2022 8:00 A M Medical Record Number: [id number] Patient Account Number: [id number] Date of Birth/Sex: Treating RN: [birth year] (56 y.o. [name] Primary Care Dorri Ozturk: [name] Other Clinician: Referring Eliza Grissinger: Treating Quatavious Rossa/Extender: [name] in Treatment: 0 Visit Information Patient Arrived: Ambulatory Arrival Time: 08:05 Accompanied By: spouse Transfer Assistance: None Patient Identification Verified: Yes Electronic Signature(s) Signed: 03/31/2022 4:42:17 PM By: Dellie Catholic RN Entered By: Dellie Catholic on 03/31/2022 08:19:44 -------------------------------------------------------------------------------- Encounter Discharge Information Details Patient Name: Date of Service: [name] MES E. 03/31/2022 8:00 A M Medical Record Number: [id number] Patient Account Number: [id number] Date of Birth/Sex: Treating RN: [birth year] (56 y.o. [name] Primary Care Dmarius Reeder: [name] Other Clinician: Referring Sira Adsit: Treating Jasimine Simms/Extender: Amanuel, [place] ([id number]) [id number] Page 2 of 4 Weeks in Treatment: 0 Encounter Discharge Information Items Discharge Condition: Stable Ambulatory Status: Ambulatory Discharge Destination: Home Transportation: Private Auto Accompanied By: spouse Schedule Follow-up Appointment: Yes Clinical Summary of Care: Patient Declined Electronic Signature(s) Signed: 03/31/2022 4:42:17 PM By: Dellie Catholic RN Entered By: Dellie Catholic on 03/31/2022 16:31:40 -------------------------------------------------------------------------------- Lower Extremity Assessment Details Patient Name: Date of Service: [name] MES E. 03/31/2022 8:00 A M Medical Record Number: [id number] Patient Account Number: [id number] Date of Birth/Sex: Treating RN: [birth year] (56 y.o. [name] Primary Care Emaley Applin: [name] Other Clinician: Referring Taeden Geller: Treating Wallice Granville/Extender: [name] in Treatment: 0 Electronic Signature(s) Signed: 03/31/2022 4:42:17 PM By: Dellie Catholic RN Entered By: Dellie Catholic on 03/31/2022 09:02:37 -------------------------------------------------------------------------------- Multi Wound Chart Details Patient Name: Date of Service: [name] MES E. 03/31/2022 8:00 A M Medical Record Number: [id number] Patient Account Number: [id number] Date of Birth/Sex: Treating RN: Jun 10, 1966 (56 y.o. M) Primary Care Jeraldine Primeau: [name] Other Clinician: Referring Elvert Cumpton: Treating Monroe Qin/Extender: [name] in Treatment: 0 Vital Signs Height(in): 70 Pulse(bpm): 101 Weight(lbs): 210 Blood Pressure(mmHg): 147/79 Body Mass Index(BMI): 30.1 Temperature(F): 98.5 Respiratory Rate(breaths/min): 16 [Treatment Notes:Wound Assessments Treatment Notes] Electronic Signature(s) Signed: 03/31/2022 9:16:05 AM By: [name] MD FACS Entered By: [name] on  03/31/2022 09:16:05 [name] ([id number]) [id number] Page 3 of 4 -------------------------------------------------------------------------------- Multi-Disciplinary Care Plan Details Patient Name: Date of Service: [name] MES E. 03/31/2022 8:00 A M Medical Record Number: [id number] Patient Account Number: [id number] Date of Birth/Sex: Treating RN: 11-20-1965 (56 y.o. 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 and treatment procedures Goals: Patient and/or family will be able to state/discuss factors appropriate to the management of their disease process during treatment Date Initiated: 03/31/2022 Target Resolution Date: 07/15/2022 Goal Status: Active Interventions: Administer decongestants, per physician orders, prior to HBO2 Notes: Electronic  Signature(s) Signed: 03/31/2022 4:42:17 PM By: Dellie Catholic RN Entered By: Dellie Catholic on 03/31/2022 09:31:59 -------------------------------------------------------------------------------- Pain Assessment Details Patient Name: Date of Service: [name] MES E. 03/31/2022 8:00 A M Medical Record Number: [id number] Patient Account Number: [id number] Date of Birth/Sex: Treating RN: 1966/02/25 (56 y.o. [name] Primary Care Lindon Kiel: [name] Other Clinician: Referring Stasia Somero: Treating Andreea Arca/Extender: [name] in Treatment: 0 Active Problems Location of Pain Severity and Description of Pain Patient Has Paino No Site Locations YASMIN, DIBELLO ([id number]) [phone].pdf Page 4 of 4 Pain Management and Medication Current Pain Management: Electronic Signature(s) Signed: 03/31/2022 4:42:17 PM  By: Dellie Catholic RN Entered By: Dellie Catholic on 03/31/2022 09:02:48 -------------------------------------------------------------------------------- Patient/Caregiver Education Details Patient Name: Date of Service: [name] MES E. 10/17/2023andnbsp8:00 A M Medical Record Number: [id number] Patient Account Number: [id number] Date of Birth/Gender: Treating RN: July 11, 1965 (56 y.o. [name] Primary Care Physician: [name] Other Clinician: Referring Physician: Treating Physician/Extender: [name] in Treatment: 0 Education Assessment Education Provided To: Patient Education Topics Provided Hyperbaric Oxygenation: Methods: Explain/Verbal Responses: Reinforcements needed Electronic Signature(s) Signed: 03/31/2022 4:42:17 PM By: Dellie Catholic RN Entered By: Dellie Catholic on 03/31/2022 09:32:06 -------------------------------------------------------------------------------- Vitals Details Patient Name: Date of Service: [name] MES E. 03/31/2022 8:00 A M Medical Record Number: [id number] Patient Account Number: [id number] Date of Birth/Sex: Treating RN: May 05, 1966 (56 y.o. [name] Primary Care Maxxwell Edgett: [name] Other Clinician: Referring Elya Diloreto: Treating Devonta Blanford/Extender: [name] in Treatment: 0 Vital Signs Time Taken: 08:05 Temperature (F): 98.5 Height (in): 70 Pulse (bpm): 101 Weight (lbs): 210 Respiratory Rate (breaths/min): 16 Body Mass Index (BMI): 30.1 Blood Pressure (mmHg): 147/79 Reference Range: 80 - 120 mg / dl Electronic Signature(s) Signed: 03/31/2022 4:42:17 PM By: Dellie Catholic RN Entered By: Dellie Catholic on 03/31/2022 08:21:48

## 2022-03-31 NOTE — Progress Notes (Addendum)
TAISHAWN, SMALDONE ([id number]) [id number] Page 1 of 8 Visit Report for 03/31/2022 Chief Complaint Document Details Patient Name: Date of Service: Dillon Dun MES E. 03/31/2022 8:00 A M Medical Record Number: [id number] Patient Account Number: [id number] Date of Birth/Sex: Treating RN: 01/04/1966 (56 y.o. M) Primary Care Provider: [name] Other Clinician: Referring Provider: Treating Provider/Extender: [name] in Treatment: 0 Information Obtained from: Patient Chief Complaint Patient [facility] for HBO eval due to radiation cystitis with hematuria Electronic Signature(s) Signed: 03/31/2022 9:16:12 AM By: [name] MD FACS Previous Signature: 03/31/2022 8:22:27 AM Version By: [name] MD FACS Entered By: [name] on 03/31/2022 09:16:12 -------------------------------------------------------------------------------- HPI Details Patient Name: Date of Service: Dillon Dun MES E. 03/31/2022 8:00 A M Medical Record Number: [id number] Patient Account Number: [id number] Date of Birth/Sex: Treating RN: Feb 16, 1966 (56 y.o. M) Primary Care Provider: [name] Other Clinician: Referring Provider: Treating Provider/Extender: [name] in Treatment: 0 History of Present Illness HPI Description: ADMISSION 03/31/2022 This is a 56 year old male with a past medical history significant for type 1 diabetes mellitus and prostate cancer. He underwent a robotic prostatectomy with retroperitoneal lymph node dissection in 2017. He developed recurrence based upon biochemical data in 2019 and received salvage radiation therapy from October to December of that year. He received 45 Gy in 25 fractions of 1.8 Gy followed by a boost to the prostate bed for a total dose of 68.4 Gy. In February 2023, He developed oligometastatic recurrence to multiple lymph nodes and to bone. As  result, he underwent additional radiation therapy including SBRT to the right eighth rib and ultra hypofractionated radiotherapy to the periaortic and presacral lymph nodes. He received 50 Gy in 5 fractions to the right eighth rib and 50 Gy in 10 fractions to the periaortic presacral lymph nodes while delivering 30 Gy in 10 fractions to the involved echelons. After this treatment, he was placed on antihormonal therapy. He began experiencing intermittent gross hematuria with passage of dark clots in May 2023. In September 2023, he presented to the emergency department with urinary retention and frank blood from his penis. A Foley catheter was placed but this obstructed with blood. It was flushed and replaced. He subsequently was admitted to the hospital on March 20, 2022 after he removed his Foley catheter at home, feeling like it was clogged. He was experiencing daily hematuria with clots and became obstructed once again. A CT scan performed showed moderate intraluminal hemorrhage in the bladder. He was taken to the operating room the next day by urology who performed cystoscopy and clot evacuation with fulguration. The intraoperative findings included moderate clot burden, cystitis changes of the bladder mucosa and active bleeding from the trigone and posterior bladder wall. He also was anemic with a hemoglobin down to 6.5 g/dL on October 8, with a baseline normal hemoglobin at 12.9. He required transfusion to resolve this. 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 Electronic Signature(s) Signed: 03/31/2022 9:16:18 AM By: [name] MD FACS Previous Signature: 03/31/2022 8:34:20 AM Version By: [name] MD FACS Entered By: [name] on 03/31/2022 09:16:18 [name] ([id number]) [id number] Page 2 of  8 -------------------------------------------------------------------------------- Physical Exam Details Patient Name: Date of Service: Dillon Dun MES E. 03/31/2022 8:00 A M Medical Record Number: [id number] Patient Account Number: [id number] Date of Birth/Sex: Treating RN: [birth year] (56 y.o. M) Primary Care  Provider: [name] Other Clinician: Referring Provider: Treating Provider/Extender: [name] in Treatment: 0 Constitutional Slightly hypertensive. Slightly tachycardic, asymptomatic.. . . No acute distress. Ears, Nose, Mouth, and Throat . Respiratory Normal work of breathing on room air.. Clear to auscultation bilaterally.. Electronic Signature(s) Signed: 03/31/2022 9:17:01 AM By: [name] MD FACS Entered By: [name] on 03/31/2022 09:17:00 -------------------------------------------------------------------------------- Physician Orders Details Patient Name: Date of Service: Dillon Dun MES E. 03/31/2022 8:00 A M Medical Record Number: [id number] Patient Account Number: [id number] Date of Birth/Sex: Treating RN: 1966-02-14 (56 y.o. 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 Follow Hyperbaric Oxygen Glycemia Protocol Afrin (Oxymetazoline HCL) 0.05% nasal spray - 1 spray in both nostrils daily as needed prior to HBO treatment for difficulty clearing ears Consults Cardiology - 12 Lead EKG- Pre Hyperbaric Therapy Treatments - (ICD10 N30.41 - Irradiation cystitis with hematuria) Radiology X-ray, Chest - PA and Lateral -Pre Hyperbaric Therapy Treatments - (ICD10 N30.41 - Irradiation cystitis with hematuria) GLYCEMIA INTERVENTIONS PROTOCOL [name], [name] ([id number]) [id number] Page 3 of 8 PRE-HBO GLYCEMIA INTERVENTIONS ACTION INTERVENTION Obtain pre-HBO capillary blood glucose (ensure 1 physician order is in chart). A. Notify HBO physician and await physician orders. 2 If result is 70 mg/dl or below: B. If the result meets the hospital definition of a critical result, follow hospital policy. A. Give patient an 8 ounce Glucerna Shake, an 8 ounce Ensure, or 8 ounces of a Glucerna/Ensure equivalent dietary supplement*. B. Wait 30 minutes. If result is 71 mg/dl to 130 mg/dl: C. Retest patients capillary blood glucose (CBG). D. If result greater than or equal to 110 mg/dl, proceed with HBO. If result less than 110 mg/dl, notify HBO physician and consider holding HBO. If result is 131 mg/dl to 249 mg/dl: A. Proceed with HBO. A. Notify HBO physician and await physician orders. B. It is recommended to hold HBO and do If result is 250 mg/dl or greater: blood/urine ketone testing. C. If the result meets the hospital definition of a critical result, follow hospital policy. POST-HBO GLYCEMIA INTERVENTIONS ACTION INTERVENTION Obtain post HBO capillary blood glucose (ensure 1 physician order is in chart). A. Notify HBO physician and await physician orders. 2 If result is 70 mg/dl or below: B. If the result meets the hospital definition of a critical result, follow hospital policy. A. Give  patient an 8 ounce Glucerna Shake, an 8 ounce Ensure, or 8 ounces of a Glucerna/Ensure equivalent dietary supplement*. B. Wait 15 minutes for symptoms of If result is 71 mg/dl to 100 mg/dl: hypoglycemia (i.e. nervousness, anxiety, sweating, chills, clamminess, irritability, confusion, tachycardia or dizziness). C. If patient asymptomatic, discharge patient. If patient symptomatic, repeat capillary blood glucose (CBG) and notify HBO physician. If result is 101 mg/dl to 249 mg/dl: A. Discharge patient. A. Notify HBO physician and await physician orders. B. It is recommended to do  blood/urine ketone If result is 250 mg/dl or greater: testing. C. If the result meets the hospital definition of a critical result, follow hospital policy. *Juice or candies are NOT equivalent products. If patient refuses the Glucerna or Ensure, please consult the hospital dietitian for an appropriate substitute. Electronic Signature(s) Signed: 03/31/2022 9:18:41 AM By: [name] MD FACS Entered By: [name] on 03/31/2022 09:18:41 Prescription 03/31/2022 -------------------------------------------------------------------------------- Melina Modena MD Patient Name: Provider: Jan 11, 1966 [id number] Date of Birth: NPI#Jerilynn Mages [id number] Sex: DEA #: [phone] Phone #: License #: [place] Patient Address: [facility] [address] [place], [place] [address] [place], [place] [address] [phone] Allergies No Known Allergies [name], [name] ([id number]) [id number] Page 4 of 8 Provider's Orders X-ray, Chest - ICD10: N30.41 - PA and Lateral -Pre Hyperbaric Therapy Treatments Hand Signature: Date(s): Prescription 03/31/2022 Melina Modena MD Patient Name: Provider: 11-29-1965 [id number] Date of Birth: NPI#: Jerilynn Mages [id number] Sex: DEA #: [phone]  [phone] Phone #: License #: [place] Patient Address: [facility] [address] [place], [place] [address] [place], [place] [address] [phone] Allergies No Known Allergies Provider's Orders Cardiology - ICD10: N30.41 - 12 Lead EKG- Pre Hyperbaric Therapy Treatments Hand Signature: Date(s): Electronic Signature(s) Signed: 03/31/2022 9:26:40 AM By: [name] MD FACS Entered By: [name] on 03/31/2022 09:26:40 -------------------------------------------------------------------------------- Problem List Details Patient Name: Date of Service: Dillon Dun MES E. 03/31/2022 8:00 A M Medical Record Number: [id number] Patient Account Number: [id number] Date of Birth/Sex: Treating RN: 24-Jan-1966 (56 y.o. 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 03/31/2022 8:00 A M Medical Record Number: [id number] Patient Account Number:  [id number] Date of Birth/Sex: Treating RN: [birth year] (56 y.o. M) Primary Care Provider: [name] Other Clinician: Referring Provider: Treating Provider/Extender: [name] in Treatment: 0 Subjective Chief Complaint Information obtained from Patient Patient presents to the [facility]ound Care center for HBO eval due to radiation cystitis with hematuria History of Present Illness (HPI) ADMISSION 03/31/2022 This is a 56 year old male with a past medical history significant for type 1 diabetes mellitus and prostate cancer. He underwent a robotic prostatectomy with retroperitoneal lymph node dissection in 2017. He developed recurrence based upon biochemical data in 2019 and received salvage radiation therapy from October to December of that year. He received 45 Gy in 25 fractions of 1.8 Gy followed by a boost to the prostate bed for a total dose of 68.4 Gy. In February 2023, He developed oligometastatic recurrence to multiple lymph nodes and to bone. As result, he underwent additional radiation therapy including SBRT to the right eighth rib and ultra hypofractionated radiotherapy to the periaortic and presacral lymph nodes.  He received 50 Gy in 5 fractions to the right eighth rib and 50 Gy in 10 fractions to the periaortic presacral lymph nodes while delivering 30 Gy in 10 fractions to the involved echelons. After this treatment, he was placed on antihormonal therapy. He began experiencing intermittent gross hematuria with passage of dark clots in May 2023. In September 2023, he presented to the emergency department with urinary retention and frank blood from his penis. A Foley catheter was placed but this obstructed with blood. It was flushed and replaced. He subsequently was admitted to the hospital on March 20, 2022 after he removed his Foley catheter at home, feeling like it was clogged. He was experiencing daily hematuria with clots and became obstructed once again. A  CT scan performed showed moderate intraluminal hemorrhage in the bladder. He was taken to the operating room the next day by urology who performed cystoscopy and clot evacuation with fulguration. The intraoperative findings included moderate clot burden, cystitis changes of the bladder mucosa and active bleeding from the trigone and posterior bladder wall. He also was anemic with a hemoglobin down to 6.5 g/dL on October 8, with a baseline normal hemoglobin at 12.9. He required transfusion to resolve this. 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 Patient History Information obtained from Patient. Allergies No Known Allergies Family History Unknown History. Social History Former smoker - started on 06/15/1998, Marital Status - Married, Alcohol Use - Moderate, Drug Use - No History, Caffeine Use - Daily. Medical History Respiratory Patient has history of Asthma - Uses Inhaler Cardiovascular Patient has history of Hypertension Endocrine Patient has history of Type I Diabetes - Takes Tresiba (Long acting) and Lyumjev (short acting) Oncologic Patient has history of Received Radiation - 08/27/21-09/09/21 Patient is treated with Insulin. Blood sugar is tested. Blood sugar results noted at the following times: Breakfast - 88. Hospitalization/Surgery History - Cystoscopy and fulgeration (03/21/22); Schwannoma (2005);Eye surgery;Lymphadenectomy (2017); Robotic assisted Lap. Radical Protatectomy Level 2. Medical A Surgical History Notes nd Eyes VQ:[id number] Retinopathy Respiratory [name], [name] ([id number]) [phone].pdf Page 6 of 8 Hx: Bronchitis Sinusitis Rhinitis Cardiovascular Hardening of the aorta Genitourinary DZ:HGDJM Hematuria Oncologic Prostate cancer Review of Systems (ROS) Constitutional Symptoms (General Health) Denies complaints or symptoms of Fatigue, Fever, Chills, Marked Weight  Change. Objective Constitutional Slightly hypertensive. Slightly tachycardic, asymptomatic.Marland Kitchen No acute distress. Vitals Time Taken: 8:05 AM, Height: 70 in, Weight: 210 lbs, BMI: 30.1, Temperature: 98.5 F, Pulse: 101 bpm, Respiratory Rate: 16 breaths/min, Blood Pressure: 147/79 mmHg. Respiratory Normal work of breathing on room air.. Clear to auscultation bilaterally.. 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 Follow Hyperbaric Oxygen Glycemia Protocol Afrin (Oxymetazoline HCL) 0.05% nasal spray - 1 spray in both nostrils daily as needed prior to HBO treatment for difficulty clearing ears Radiology ordered were: X-ray, Chest - PA and Lateral -Pre Hyperbaric Therapy Treatments Consults ordered were: Cardiology - 12 Lead EKG- Pre Hyperbaric Therapy Treatments 03/31/2022: This is a 56 year old man with a history of prostate cancer who underwent 2 separate  rounds of radiation therapy, 68.4 Gy initially, with a subsequent additional 50 Gy for recurrence/metastases. 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 He has had gross hematuria and required clot evacuation secondary to obstruction. He will benefit from hyperbaric oxygen therapy. I have recommended 40 treatments of 90 minutes at 2.0 atm without air breaks. As he is diabetic, we will follow the hyperbaric oxygen therapy glucose protocol. He does need an EKG and chest x-ray  prior to initiating treatment. Once these are complete, I anticipate that he may start treatment. He will continue to be monitored for hematuria and receive intermittent therapy as indicated per urology. Electronic Signature(s) Signed: 03/31/2022 9:20:43 AM By: [name] MD FACS Entered By: [name] on 03/31/2022 09:20:43 [name] ([id number]) [id number] Page 7 of 8 -------------------------------------------------------------------------------- HxROS Details Patient Name: Date of Service: Dillon Dun MES E. 03/31/2022 8:00 A M Medical Record Number: [id number] Patient Account Number: [id number] Date of Birth/Sex: Treating RN: 03/17/1966 (56 y.o. [name] Primary Care Provider: [name] Other Clinician: Referring Provider: Treating Provider/Extender: [name] in Treatment: 0 Information Obtained From Patient Constitutional Symptoms (General Health) Complaints and Symptoms: Negative for: Fatigue; Fever; Chills; Marked Weight Change Eyes Medical History: Past Medical History Notes: JA:[id number] Retinopathy Hematologic/Lymphatic Respiratory Medical History: Positive for: Asthma - Uses Inhaler Past Medical History Notes: Hx: Bronchitis Sinusitis Rhinitis Cardiovascular Medical History: Positive for: Hypertension Past Medical History Notes: Hardening of the aorta Endocrine Medical History: Positive for: Type I Diabetes - Takes Tresiba (Long acting) and Lyumjev (short acting) Time with diabetes: 31 years Treated with: Insulin Blood sugar tested every day: Yes Tested : Wears a Libre on right arm Blood sugar testing results: Breakfast: 88 Genitourinary Medical History: Past Medical History Notes: BH:ALPFX Hematuria Oncologic Medical History: Positive for: Received Radiation - 08/27/21-09/09/21 Past Medical History Notes: Prostate cancer Immunizations Pneumococcal  Vaccine: Received Pneumococcal Vaccination: No Implantable Devices None [name], [name] ([id number]) [id number] Page 8 of 8 Hospitalization / Surgery History Type of Hospitalization/Surgery Cystoscopy and fulgeration (03/21/22); Schwannoma (2005);Eye surgery;Lymphadenectomy (2017); Robotic assisted Lap. Radical Protatectomy Level 2 Family and Social History Unknown History: Yes; Former smoker - started on 06/15/1998; Marital Status - Married; Alcohol Use: Moderate; Drug Use: No History; Caffeine Use: Daily; Financial Concerns: No; Food, Clothing or Shelter Needs: No; Support System Lacking: No; Transportation Concerns: No Electronic Signature(s) Signed: 03/31/2022 9:29:42 AM By: [name] MD FACS Signed: 03/31/2022 4:42:17 PM By: Dellie Catholic RN Entered By: Dellie Catholic on 03/31/2022 08:43:08 -------------------------------------------------------------------------------- SuperBill Details Patient Name: Date of Service: Dillon Dun MES E. 03/31/2022 Medical Record Number: [id number] Patient Account Number: [id number] Date of Birth/Sex: Treating RN: 21-Aug-1965 (56 y.o. 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

## 2022-03-31 NOTE — Progress Notes (Signed)
MIN, COLLYMORE ([id number]) [id number] Page 1 of 4 Visit Report for 03/31/2022 Abuse Risk Screen Details Patient Name: Date of Service: Dillon Dun MES E. 03/31/2022 8:00 A M Medical Record Number: [id number] Patient Account Number: [id number] Date of Birth/Sex: Treating RN: [name] (56 y.o. [name] Primary Care [name]: [name] Other Clinician: Referring [name]: Treating [name]/Extender: [name] in Treatment: 0 Abuse Risk Screen Items Answer ABUSE RISK SCREEN: Has anyone close to you tried to hurt or harm you recentlyo No Do you feel uncomfortable with anyone in your familyo No Has anyone forced you do things that you didnt want to doo No Electronic Signature(s) Signed: 03/31/2022 4:42:17 PM By: Dillon Catholic RN Entered By: [name] on 03/31/2022 09:00:53 -------------------------------------------------------------------------------- Activities of Daily Living Details Patient Name: Date of Service: Dillon Dun MES E. 03/31/2022 8:00 A M Medical Record Number: [id number] Patient Account Number: [id number] Date of Birth/Sex: Treating RN: [birth year] [name] (56 y.o. [name] Primary Care Melitta Tigue: [name] Other Clinician: Referring Brayli Klingbeil: Treating Shrihaan Porzio/Extender: [name] in Treatment: 0 Activities of Daily Living Items Answer Activities of Daily Living (Please select one for each item) Drive Automobile Completely Able T Medications ake Completely Able Use T elephone Completely Able Care for Appearance Completely Able Use T oilet Completely Able Bath / Shower Completely Able Dress Self Completely Able Feed Self Completely Able Walk Completely Able Get In / Out Bed Completely Able Housework Completely Able Prepare Meals Completely Able Handle Money Completely Able Shop for Self Completely Able Electronic Signature(s) Signed:  03/31/2022 4:42:17 PM By: Dillon Catholic RN Entered By: [name] on 03/31/2022 09:01:16 [name], [name] ([id number]) [phone] [id number] Page 2 of 4 -------------------------------------------------------------------------------- Education Screening Details Patient Name: Date of Service: Dillon Dun MES E. 03/31/2022 8:00 A M Medical Record Number: [id number] Patient Account Number: [id number] Date of Birth/Sex: Treating RN: [name] (56 y.o. [name] Primary Care Arther Heisler: [name] Other Clinician: Referring Raveena Hebdon: Treating Nicklas Mcsweeney/Extender: [name] in Treatment: 0 Learning Preferences/Education Level/Primary Language Learning Preference: Explanation, Demonstration, Printed Material Highest Education Level: College or Above Preferred Language: English Cognitive Barrier Language Barrier: No Translator Needed: No Memory Deficit: No Emotional Barrier: No Cultural/Religious Beliefs Affecting Medical Care: No Physical Barrier Impaired Vision: No Impaired Hearing: No Decreased Hand dexterity: No Knowledge/Comprehension Knowledge Level: High Comprehension Level: High Ability to understand written instructions: High Ability to understand verbal instructions: High Motivation Anxiety Level: Calm Cooperation: Cooperative Education Importance: Acknowledges Need Interest in Health Problems: Asks Questions Perception: Coherent Willingness to Engage in Self-Management High Activities: Readiness to Engage in Self-Management High Activities: Electronic Signature(s) Signed: 03/31/2022 4:42:17 PM By: Dillon Catholic RN Entered By: [name] on 03/31/2022 09:01:45 -------------------------------------------------------------------------------- Fall Risk Assessment Details Patient Name: Date of Service: Dillon Dun MES E. 03/31/2022 8:00 A M Medical Record Number: [id number] Patient Account Number:  [id number] Date of Birth/Sex: Treating RN: [name] (56 y.o. [name] Primary Care Neven Fina: [name] Other Clinician: Referring Aubert Choyce: Treating Makaelah Cranfield/Extender: [name] in Treatment: 0 Fall Risk Assessment Items Have you had 2 or more falls in the last 12 monthso 0 No Have you had any fall that resulted in injury in the last 12 monthso 0 No [name], [name] ([id number]) [phone] [id number] Page 3 of 4 FALLS RISK SCREEN History of falling - immediate or within 3 months 0 No Secondary diagnosis (Do you have 2 or more medical  diagnoseso) 0 No Ambulatory aid None/bed rest/wheelchair/nurse 0 No Crutches/cane/walker 0 No Furniture 0 No Intravenous therapy Access/Saline/Heparin Lock 0 No Gait/Transferring Normal/ bed rest/ wheelchair 0 No Weak (short steps with or without shuffle, stooped but able to lift head while walking, may seek 0 No support from furniture) Impaired (short steps with shuffle, may have difficulty arising from chair, head down, impaired 0 No balance) Mental Status Oriented to own ability 0 No Electronic Signature(s) Signed: 03/31/2022 4:42:17 PM By: Dillon Catholic RN Entered By: [name] on 03/31/2022 09:01:51 -------------------------------------------------------------------------------- Foot Assessment Details Patient Name: Date of Service: Dillon Dun MES E. 03/31/2022 8:00 A M Medical Record Number: [id number] Patient Account Number: [id number] Date of Birth/Sex: Treating RN: [birth year] (56 y.o. [name] Primary Care Micheale Schlack: [name] Other Clinician: Referring Shandra Szymborski: Treating Allyce Bochicchio/Extender: [name] in Treatment: 0 Foot Assessment Items Site Locations + = Sensation present, - = Sensation absent, C = Callus, U = Ulcer R = Redness, W = Warmth, M = Maceration, PU = Pre-ulcerative lesion F = Fissure, S = Swelling, D =  Dryness Assessment Right: Left: Other Deformity: No No Prior Foot Ulcer: No No Prior Amputation: No No Charcot Joint: No No Ambulatory Status: Ambulatory Without Help [name], [name] ([id number]) [id number] Page 4 of 4 Electronic Signature(s) Signed: 03/31/2022 4:42:17 PM By: Dillon Catholic RN Entered By: [name] on 03/31/2022 09:02:24 -------------------------------------------------------------------------------- Nutrition Risk Screening Details Patient Name: Date of Service: Dillon Dun MES E. 03/31/2022 8:00 A M Medical Record Number: [id number] Patient Account Number: [id number] Date of Birth/Sex: Treating RN: [name] (56 y.o. [name] Primary Care Valoree Agent: [name] Other Clinician: Referring Sosha Shepherd: Treating Donnalyn Juran/Extender: [name] in Treatment: 0 Height (in): 70 Weight (lbs): 210 Body Mass Index (BMI): 30.1 Nutrition Risk Screening Items Score Screening NUTRITION RISK SCREEN: I have an illness or condition that made me change the kind and/or amount of food I eat 0 No I eat fewer than two meals per day 0 No I eat few fruits and vegetables, or milk products 0 No I have three or more drinks of beer, liquor or wine almost every day 0 No I have tooth or mouth problems that make it hard for me to eat 0 No I don't always have enough money to buy the food I need 0 No I eat alone most of the time 0 No I take three or more different prescribed or over-the-counter drugs a day 0 No Without wanting to, I have lost or gained 10 pounds in the last six months 0 No I am not always physically able to shop, cook and/or feed myself 0 No Nutrition Protocols Good Risk Protocol 0 No interventions needed Moderate Risk Protocol High Risk Proctocol Risk Level: Good Risk Score: 0 Electronic Signature(s) Signed: 03/31/2022 4:42:17 PM By: Dillon Catholic RN Entered By: [name] on 03/31/2022 09:02:10

## 2022-04-01 ENCOUNTER — Ambulatory Visit (HOSPITAL_COMMUNITY)
Admission: RE | Admit: 2022-04-01 | Discharge: 2022-04-01 | Disposition: A | Discharge status: Home / Self Care | Payer: BC Managed Care – PPO | Source: Ambulatory Visit | Attending: Endocrinology | Admitting: Endocrinology

## 2022-04-01 DIAGNOSIS — N3041 Irradiation cystitis with hematuria: Secondary | ICD-10-CM | POA: Insufficient documentation

## 2022-04-09 ENCOUNTER — Encounter (HOSPITAL_BASED_OUTPATIENT_CLINIC_OR_DEPARTMENT_OTHER): Payer: BC Managed Care – PPO | Admitting: General Surgery

## 2022-04-09 DIAGNOSIS — N3041 Irradiation cystitis with hematuria: Secondary | ICD-10-CM | POA: Diagnosis not present

## 2022-04-09 LAB — GLUCOSE, CAPILLARY
Glucose-Capillary: 211 mg/dL — ABNORMAL HIGH (ref 70–99)
Glucose-Capillary: 212 mg/dL — ABNORMAL HIGH (ref 70–99)

## 2022-04-09 NOTE — Progress Notes (Addendum)
CADEN, FUKUSHIMA ([id number]) [id number] Page 1 of 2 Visit Report for 04/09/2022 Arrival Information Details Patient Name: Date of Service: [name] MES E. 04/09/2022 10:00 A M Medical Record Number: [id number] Patient Account Number: [id number] Date of Birth/Sex: Treating RN: December 05, 1965 (56 y.o. [name] Primary Care Keerthana Vanrossum: [name] Other Clinician: [name] Referring Towanda Hornstein: Treating Sherod Cisse/Extender: [name] in Treatment: 1 Visit Information History Since Last Visit All ordered tests and consults were completed: Yes Patient Arrived: Ambulatory Added or deleted any medications: No Arrival Time: 09:31 Any new allergies or adverse reactions: No Accompanied By: None Had a fall or experienced change in No Transfer Assistance: None activities of daily living that may affect Patient Identification Verified: Yes risk of falls: Secondary Verification Process Completed: Yes Signs or symptoms of abuse/neglect since last visito No Patient Requires Transmission-Based Precautions: No Hospitalized since last visit: No Patient Has Alerts: No Implantable device outside of the clinic excluding No cellular tissue based products placed in the center since last visit: Pain Present Now: No Electronic Signature(s) Signed: 04/09/2022 1:58:26 PM By: [name] EMT Entered By: [name] on 04/09/2022 13:58:26 -------------------------------------------------------------------------------- Encounter Discharge Information Details Patient Name: Date of Service: [name] MES E. 04/09/2022 10:00 A M Medical Record Number: [id number] Patient Account Number: [id number] Date of Birth/Sex: Treating RN: Jan 19, 1966 (56 y.o. [name] Primary Care Keelan Tripodi: [name] Other Clinician: [name] Referring Alhassan Everingham: Treating Marai Teehan/Extender: [name] in  Treatment: 1 Encounter Discharge Information Items Discharge Condition: Stable Ambulatory Status: Ambulatory Discharge Destination: Home Transportation: Private Auto Accompanied By: None Schedule Follow-up Appointment: Yes Clinical Summary of Care: Electronic Signature(s) Signed: 04/09/2022 3:19:21 PM By: [name] EMT Entered By: [name] on 04/09/2022 15:19:20 [name] ([id number]) [id number] Page 2 of 2 -------------------------------------------------------------------------------- Vitals Details Patient Name: Date of Service: [name] MES E. 04/09/2022 10:00 A M Medical Record Number: [id number] Patient Account Number: [id number] Date of Birth/Sex: Treating RN: 10-31-1965 (56 y.o. [name] Primary Care Jorita Bohanon: [name] Other Clinician: [name] Referring Hadlei Stitt: Treating Jan Olano/Extender: [name] in Treatment: 1 Vital Signs Time Taken: 09:53 Temperature (F): 97.7 Height (in): 70 Pulse (bpm): 94 Weight (lbs): 210 Respiratory Rate (breaths/min): 18 Body Mass Index (BMI): 30.1 Blood Pressure (mmHg): 163/72 Capillary Blood Glucose (mg/dl): 211 Reference Range: 80 - 120 mg / dl Electronic Signature(s) Signed: 04/09/2022 1:58:56 PM By: [name] EMT Entered By: [name] on 04/09/2022 13:58:56

## 2022-04-09 NOTE — Progress Notes (Addendum)
ADMIRAL, MARCUCCI ([id number]) [id number] Page 1 of 2 Visit Report for 04/09/2022 HBO Details Patient Name: Date of Service: [name] MES E. 04/09/2022 10:00 A M Medical Record Number: [id number] Patient Account Number: [id number] Date of Birth/Sex: Treating RN: 04-19-1966 (56 y.o. [name] Primary Care Ira Dougher: [name] Other Clinician: [name] Referring Riyah Bardon: Treating Brina Umeda/Extender: [name] in Treatment: 1 HBO Treatment Course Details Treatment Course Number: 1 Ordering Tabia Landowski: [name] T Treatments Ordered: otal 40 HBO Treatment Start Date: 04/09/2022 HBO Indication: Late Effect of Radiation HBO Treatment Details Treatment Number: 1 Patient Type: Outpatient Chamber Type: Monoplace Chamber Serial #: [id number] Treatment Protocol: 2.0 ATA with 90 minutes oxygen, and no air breaks Treatment Details Compression Rate Down: 1.0 psi / minute De-Compression Rate Up: 1.0 psi / minute Air breaks and breathing Decompress Decompress Compress Tx Pressure Begins Reached periods Begins Ends (leave unused spaces blank) Chamber Pressure (ATA 1 2 ------2 1 ) Clock Time (24 hr) 10:33 10:45 - - - - - - 12:15 12:31 Treatment Length: 118 (minutes) Treatment Segments: 4 Vital Signs Capillary Blood Glucose Reference Range: 80 - 120 mg / dl HBO Diabetic Blood Glucose Intervention Range: <131 mg/dl or >249 mg/dl Time Vitals Blood Respiratory Capillary Blood Glucose Pulse Action Type: Pulse: Temperature: Taken: Pressure: Rate: Glucose (mg/dl): Meter #: Oximetry (%) Taken: Pre 09:53 163/72 94 18 97.7 211 Post 12:35 174/94 82 18 97.7 212 Treatment Response Treatment Toleration: Well Treatment Completion Status: Treatment Completed without Adverse Event Treatment Notes The patient was seen by Dr. Heber [place] before and after treatment. The patient had no problems clearing his ears. Physician  HBO Attestation: I certify that I supervised this HBO treatment in accordance with Medicare guidelines. A trained emergency response team is readily available per Yes hospital policies and procedures. Continue HBOT as ordered. Yes Electronic Signature(s) Signed: 04/10/2022 8:02:55 AM By: [name] MD FACS Previous Signature: 04/09/2022 3:18:42 PM Version By: [name] EMT Previous Signature: 04/09/2022 3:17:04 PM Version By: [name] EMT Entered By: [name] on 04/10/2022 08:02:54 [name] ([id number]) [id number] Page 2 of 2 -------------------------------------------------------------------------------- HBO Safety Checklist Details Patient Name: Date of Service: [name] MES E. 04/09/2022 10:00 A M Medical Record Number: [id number] Patient Account Number: [id number] Date of Birth/Sex: Treating RN: 1966/02/28 (56 y.o. [name] Primary Care Elwyn Lowden: [name] Other Clinician: [name] Referring Ladasha Schnackenberg: Treating Ikia Cincotta/Extender: [name] in Treatment: 1 [name] Safety Checklist Consent Form Signed Patient voided / foley secured and emptied When did you last eato 0900 Last dose of injectable or oral agent 0800 Ostomy pouch emptied and vented if applicable NA All implantable devices assessed, documented and approved Libre2 Intravenous access site secured and place NA Valuables secured Linens and cotton and cotton/polyester blend (less than 51% polyester) Personal oil-based products / skin lotions / body lotions removed Wigs or hairpieces removed NA Smoking or tobacco materials removed Books / newspapers / magazines / loose paper removed Cologne, aftershave, perfume and deodorant removed Jewelry removed (may wrap wedding band) Make-up removed NA Hair care products removed Battery operated devices (external) removed Heating patches and chemical  warmers removed Titanium eyewear removed NA Nail polish cured greater than 10 hours NA Casting material cured greater than 10 hours NA Hearing aids removed NA Loose dentures or partials removed NA Prosthetics have been removed NA Patient demonstrates correct use of air break device (if applicable) Patient concerns have been  addressed Patient grounding bracelet on and cord attached to chamber Specifics for Inpatients (complete in addition to above) Medication sheet sent with patient NA Intravenous medications needed or due during therapy sent with patient NA Drainage tubes (e.g. nasogastric tube or chest tube secured and vented) NA Endotracheal or Tracheotomy tube secured NA Cuff deflated of air and inflated with saline NA Airway suctioned NA Notes The safety checklist was done before the treatment was started. Electronic Signature(s) Signed: 04/09/2022 3:13:11 PM By: [name] EMT Entered By: [name] on 04/09/2022 15:13:11

## 2022-04-10 ENCOUNTER — Encounter (HOSPITAL_BASED_OUTPATIENT_CLINIC_OR_DEPARTMENT_OTHER): Payer: BC Managed Care – PPO | Admitting: General Surgery

## 2022-04-10 DIAGNOSIS — N3041 Irradiation cystitis with hematuria: Secondary | ICD-10-CM | POA: Diagnosis not present

## 2022-04-10 LAB — GLUCOSE, CAPILLARY
Glucose-Capillary: 254 mg/dL — ABNORMAL HIGH (ref 70–99)
Glucose-Capillary: 193 mg/dL — ABNORMAL HIGH (ref 70–99)

## 2022-04-10 NOTE — Progress Notes (Signed)
**Note Dillon-Identified via Obfuscation** [name] ([id number]) [id number] Page 1 of 2 Visit Report for 04/10/2022 Arrival Information Details Patient Name: Date of Service: Dillon Dun MES E. 04/10/2022 10:00 A M Medical Record Number: [id number] Patient Account Number: [id number] Date of Birth/Sex: Treating RN: 07/05/1965 (56 y.o. [name] Primary Care [name]: [name] Other Clinician: [name] Referring [name]: Treating [name]/Extender: [name] in Treatment: 1 Visit Information History Since Last Visit All ordered tests and consults were completed: Yes Patient Arrived: Ambulatory Added or deleted any medications: No Arrival Time: 09:43 Any new allergies or adverse reactions: No Accompanied By: None Had a fall or experienced change in No Transfer Assistance: None activities of daily living that may affect Patient Identification Verified: Yes risk of falls: Secondary Verification Process Completed: Yes Signs or symptoms of abuse/neglect since last visito No Patient Requires Transmission-Based Precautions: No Hospitalized since last visit: No Patient Has Alerts: No Implantable device outside of the clinic excluding No cellular tissue based products placed in the center since last visit: Pain Present Now: No Electronic Signature(s) Signed: 04/10/2022 2:27:47 PM By: [name] EMT Entered By: [name] on 04/10/2022 14:27:46 -------------------------------------------------------------------------------- Encounter Discharge Information Details Patient Name: Date of Service: Dillon Dun MES E. 04/10/2022 10:00 A M Medical Record Number: [id number] Patient Account Number: [id number] Date of Birth/Sex: Treating RN: June 05, 1966 (56 y.o. [name] Primary Care [name]: [name] Other Clinician: [name] Referring [name]: Treating [name]/Extender: [name] in Treatment:  1 Encounter Discharge Information Items Discharge Condition: Stable Ambulatory Status: Ambulatory Discharge Destination: Home Transportation: Private Auto Accompanied By: None Schedule Follow-up Appointment: Yes Clinical Summary of Care: Electronic Signature(s) Signed: 04/10/2022 2:35:20 PM By: [name] EMT Entered By: [name] on 04/10/2022 14:35:20 [name] ([id number]) [id number] Page 2 of 2 -------------------------------------------------------------------------------- Vitals Details Patient Name: Date of Service: Dillon Dun MES E. 04/10/2022 10:00 A M Medical Record Number: [id number] Patient Account Number: [id number] Date of Birth/Sex: Treating RN: [birth year] (56 y.o. [name] Primary Care [name]: [name] Other Clinician: [name] Referring [name]: Treating [name] in Treatment: 1 Vital Signs Time Taken: 10:31 Temperature (F): 97.7 Height (in): 70 Pulse (bpm): 91 Weight (lbs): 210 Respiratory Rate (breaths/min): 18 Body Mass Index (BMI): 30.1 Blood Pressure (mmHg): 152/75 Capillary Blood Glucose (mg/dl): 254 Reference Range: 80 - 120 mg / dl Electronic Signature(s) Signed: 04/10/2022 2:29:22 PM By: [name] EMT Entered By: [name] on 04/10/2022 14:29:22

## 2022-04-10 NOTE — Progress Notes (Signed)
CHARLEE, WHITEBREAD ([id number]) [id number] Page 1 of 2 Visit Report for 04/09/2022 Problem List Details Patient Name: Date of Service: Dillon Dun MES E. 04/09/2022 10:00 A M Medical Record Number: [id number] Patient Account Number: [id number] Date of Birth/Sex: Treating RN: 12/07/1965 (56 y.o. 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 04/09/2022 Medical Record Number: [id number] Patient Account Number: [id number] Date of Birth/Sex: Treating RN: 10/25/1965 (56 y.o. [name] Primary Care Provider: [name] Other Clinician: [name] Referring Provider: Treating Provider/Extender: [name] in Treatment: 1 Diagnosis Coding ICD-10 Codes Code Description N30.41 Irradiation cystitis with hematuria DELAWRENCE, FRIDMAN ([id number]) [id number] Page 2 of 2 R31.9 Hematuria, unspecified C61 Malignant neoplasm of prostate E10.3529 Type 1 diabetes mellitus with proliferative  diabetic retinopathy with traction retinal detachment involving the macula, unspecified eye Facility Procedures : CPT4 Code: [id number] Description: G0277-(Facility Use Only) HBOT full body chamber, 59mn , ICD-10 Diagnosis Description 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 Hematuria, unspecified C61 Malignant neoplasm of prostate Modifier: Quantity: 4 Physician Procedures : CPT4 Code Description Modifier [id number] PHYS HYPERBARIC OXYGEN THERAPY ICD-10 Diagnosis Description 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 Hematuria, unspecified C61 Malignant neoplasm of prostate Quantity: 1 Electronic Signature(s) Signed: 04/09/2022 3:17:36 PM By: GValeria BatmanEMT Signed: 04/10/2022 8:01:11 AM By: CFredirick MaudlinMD FACS Entered By: GValeria Batmanon 04/09/2022 15:17:36

## 2022-04-10 NOTE — Progress Notes (Signed)
Dillon, [name] ([id number]) [id number] Page 1 of 2 Visit Report for 04/10/2022 HBO Details Patient Name: Date of Service: Dillon [name]. 04/10/2022 10:00 A M Medical Record Number: [id number] Patient Account Number: [id number] Date of Birth/Sex: Treating RN: 03-Feb-1966 (56 y.o. Dillon [name] Primary Care Dillon [name]: [name] Other Clinician: [name] Referring Dillon [name]: Treating [place]hann [place]/Extender: [name] in Treatment: 1 HBO Treatment Course Details Treatment Course Number: 1 Ordering Telma Pyeatt: [name] T Treatments Ordered: otal 40 HBO Treatment Start Date: 04/09/2022 HBO Indication: Late Effect of Radiation HBO Treatment Details Treatment Number: 2 Patient Type: Outpatient Chamber Type: Monoplace Chamber Serial #: [id number] Treatment Protocol: 2.0 ATA with 90 minutes oxygen, and no air breaks Treatment Details Compression Rate Down: 1.0 psi / minute De-Compression Rate Up: 1.0 psi / minute Air breaks and breathing Decompress Decompress Compress Tx Pressure Begins Reached periods Begins Ends (leave unused spaces blank) Chamber Pressure (ATA 1 2 ------2 1 ) Clock Time (24 hr) 10:36 10:49 - - - - - - 12:19 12:32 Treatment Length: 116 (minutes) Treatment Segments: 4 Vital Signs Capillary Blood Glucose Reference Range: 80 - 120 mg / dl HBO Diabetic Blood Glucose Intervention Range: <131 mg/dl or >249 mg/dl Time Vitals Blood Respiratory Capillary Blood Glucose Pulse Action Type: Pulse: Temperature: Taken: Pressure: Rate: Glucose (mg/dl): Meter #: Oximetry (%) Taken: Pre 10:31 152/75 91 18 97.7 254 Post 12:39 165/83 80 18 97.7 193 Treatment Response Treatment Toleration: Well Treatment Completion Status: Treatment Completed without Adverse Event Physician HBO Attestation: I certify that I supervised this HBO treatment in accordance with Medicare guidelines. A trained emergency response  team is readily available per Yes hospital policies and procedures. Continue HBOT as ordered. Yes Electronic Signature(s) Signed: 04/10/2022 3:49:49 PM By: [name] MD FACS Previous Signature: 04/10/2022 2:32:50 PM Version By: [name] EMT Entered By: [name] on 04/10/2022 15:49:49 [name] ([id number]) [phone].pdf Page 2 of 2 -------------------------------------------------------------------------------- HBO Safety Checklist Details Patient Name: Date of Service: Dillon [name]. 04/10/2022 10:00 A M Medical Record Number: [id number] Patient Account Number: [id number] Date of Birth/Sex: Treating RN: 08-25-1965 (56 y.o. Dillon [name], Meta.Reding Primary Care Dillon [name]: [name] Other Clinician: [name] Referring Dillon [name]: Treating [place]/Extender: [name] in Treatment: 1 [name] Safety Checklist Consent Form Signed Patient voided / foley secured and emptied When did you last eato 0900 Last dose of injectable or oral agent 0800 Ostomy pouch emptied and vented if applicable NA All implantable devices assessed, documented and approved Libre2 Intravenous access site secured and place NA Valuables secured Linens and cotton and cotton/polyester blend (less than 51% polyester) Personal oil-based products / skin lotions / body lotions removed Wigs or hairpieces removed NA Smoking or tobacco materials removed Books / newspapers / magazines / loose paper removed Cologne, aftershave, perfume and deodorant removed Jewelry removed (may wrap wedding band) Make-up removed NA Hair care products removed Battery operated devices (external) removed Heating patches and chemical warmers removed Titanium eyewear removed NA Nail polish cured greater than 10 hours NA Casting material cured greater than 10 hours NA Hearing aids removed NA Loose dentures or partials removed removed by  patient Prosthetics have been removed NA Patient demonstrates correct use of air break device (if applicable) Patient concerns have been addressed Patient grounding bracelet on and cord attached to chamber Specifics for Inpatients (complete in addition to above) Medication sheet sent with patient NA Intravenous medications needed or due  during therapy sent with patient NA Drainage tubes ([name].g. nasogastric tube or chest tube secured and vented) NA Endotracheal or Tracheotomy tube secured NA Cuff deflated of air and inflated with saline NA Airway suctioned NA Notes The safety checklist was done before the treatment was started. Electronic Signature(s) Signed: 04/10/2022 2:34:26 PM By: [name] EMT Previous Signature: 04/10/2022 2:30:32 PM Version By: [name] EMT Entered By: [name] on 04/10/2022 14:34:26

## 2022-04-10 NOTE — Progress Notes (Signed)
KOURY, RODDY ([id number]) [id number] Page 1 of 2 Visit Report for 04/10/2022 Problem List Details Patient Name: Date of Service: Dillon Dun MES E. 04/10/2022 10:00 A M Medical Record Number: [id number] Patient Account Number: [id number] Date of Birth/Sex: Treating RN: 13-Sep-1965 (56 y.o. 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 04/10/2022 Medical Record Number: [id number] Patient Account Number: [id number] Date of Birth/Sex: Treating RN: 1965/08/30 (56 y.o. [name] Primary Care Provider: [name] Other Clinician: [name] Referring Provider: Treating Provider/Extender: [name] in Treatment: 1 Diagnosis Coding ICD-10 Codes Code Description N30.41 Irradiation cystitis with hematuria [name], [name] ([id number]) [id number] Page 2 of 2 R31.9 Hematuria, unspecified C61 Malignant neoplasm of prostate E10.3529 Type 1 diabetes mellitus with proliferative diabetic  retinopathy with traction retinal detachment involving the macula, unspecified eye Facility Procedures : CPT4 Code: 11941740 Description: G0277-(Facility Use Only) HBOT full body chamber, 32mn , ICD-10 Diagnosis Description 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 Hematuria, unspecified C61 Malignant neoplasm of prostate Modifier: Quantity: 4 Physician Procedures : CPT4 Code Description Modifier [id number] PHYS HYPERBARIC OXYGEN THERAPY ICD-10 Diagnosis Description 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 Hematuria, unspecified C61 Malignant neoplasm of prostate Quantity: 1 Electronic Signature(s) Signed: 04/10/2022 2:33:13 PM By: GValeria BatmanEMT Signed: 04/10/2022 3:48:38 PM By: CFredirick MaudlinMD FACS Entered By: GValeria Batmanon 04/10/2022 14:33:13

## 2022-04-13 ENCOUNTER — Encounter (HOSPITAL_BASED_OUTPATIENT_CLINIC_OR_DEPARTMENT_OTHER): Payer: BC Managed Care – PPO | Admitting: Internal Medicine

## 2022-04-13 DIAGNOSIS — R319 Hematuria, unspecified: Secondary | ICD-10-CM

## 2022-04-13 DIAGNOSIS — C61 Malignant neoplasm of prostate: Secondary | ICD-10-CM

## 2022-04-13 DIAGNOSIS — N3041 Irradiation cystitis with hematuria: Secondary | ICD-10-CM

## 2022-04-13 LAB — GLUCOSE, CAPILLARY
Glucose-Capillary: 215 mg/dL — ABNORMAL HIGH (ref 70–99)
Glucose-Capillary: 141 mg/dL — ABNORMAL HIGH (ref 70–99)

## 2022-04-13 NOTE — Progress Notes (Signed)
BURK, HOCTOR ([id number]) [id number] Page 1 of 2 Visit Report for 04/13/2022 Arrival Information Details Patient Name: Date of Service: [name] MES E. 04/13/2022 10:00 A M Medical Record Number: [id number] Patient Account Number: [id number] Date of Birth/Sex: Treating RN: 1966-06-13 (56 y.o. [name] Primary Care Terica Yogi: [name] Other Clinician: [name] Referring Liyat Faulkenberry: Treating Peony Barner/Extender: [name] in Treatment: 1 Visit Information History Since Last Visit All ordered tests and consults were completed: Yes Patient Arrived: Ambulatory Added or deleted any medications: No Arrival Time: 09:30 Any new allergies or adverse reactions: No Accompanied By: self Had a fall or experienced change in No Transfer Assistance: None activities of daily living that may affect Patient Identification Verified: Yes risk of falls: Secondary Verification Process Completed: Yes Signs or symptoms of abuse/neglect since last visito No Patient Requires Transmission-Based Precautions: No Hospitalized since last visit: No Patient Has Alerts: No Implantable device outside of the clinic excluding No cellular tissue based products placed in the center since last visit: Pain Present Now: No Electronic Signature(s) Signed: 04/13/2022 4:03:30 PM By: Donavan Burnet CHT EMT BS , , Entered By: Donavan Burnet on 04/13/2022 16:03:30 -------------------------------------------------------------------------------- Encounter Discharge Information Details Patient Name: Date of Service: [name] MES E. 04/13/2022 10:00 A M Medical Record Number: [id number] Patient Account Number: [id number] Date of Birth/Sex: Treating RN: 05-Feb-1966 (56 y.o. [name] Primary Care Everleigh Colclasure: [name] Other Clinician: Donavan Burnet Referring Belen Pesch: Treating Thailan Sava/Extender: [name] in Treatment: 1 Encounter Discharge Information Items Discharge Condition: Stable Ambulatory Status: Ambulatory Discharge Destination: Home Transportation: Private Auto Accompanied By: self Schedule Follow-up Appointment: No Clinical Summary of Care: Electronic Signature(s) Signed: 04/13/2022 4:31:03 PM By: Donavan Burnet CHT EMT BS , , Entered By: Donavan Burnet on 04/13/2022 Perdido, Maciej E ([id number]) [id number] Page 2 of 2 -------------------------------------------------------------------------------- Vitals Details Patient Name: Date of Service: [name] MES E. 04/13/2022 10:00 A M Medical Record Number: [id number] Patient Account Number: [id number] Date of Birth/Sex: Treating RN: Oct 25, 1965 (56 y.o. [name] Primary Care Geo Slone: [name] Other Clinician: Donavan Burnet Referring Caedyn Raygoza: Treating Alishia Lebo/Extender: [name] in Treatment: 1 Vital Signs Time Taken: 09:47 Temperature (F): 97.3 Height (in): 70 Pulse (bpm): 94 Weight (lbs): 210 Respiratory Rate (breaths/min): 16 Body Mass Index (BMI): 30.1 Blood Pressure (mmHg): 170/65 Capillary Blood Glucose (mg/dl): 215 Reference Range: 80 - 120 mg / dl Electronic Signature(s) Signed: 04/13/2022 4:04:13 PM By: Donavan Burnet CHT EMT BS , , Entered By: Donavan Burnet on 04/13/2022 16:04:13

## 2022-04-13 NOTE — Progress Notes (Addendum)
[name] ([id number]) [id number] Page 1 of 2 Visit Report for 04/13/2022 HBO Details Patient Name: Date of Service: [name]. 04/13/2022 10:00 A M Medical Record Number: [id number] Patient Account Number: [id number] Date of Birth/Sex: Treating RN: 05-01-1966 (57 y.o. [name] Primary Care [name]: [name] Other Clinician: Donavan [name] Referring [name]: Treating [name]/Extender: [name] in Treatment: 1 HBO Treatment Course Details Treatment Course Number: 1 Ordering [name]: [name] T Treatments Ordered: otal 40 HBO Treatment Start Date: 04/09/2022 HBO Indication: Late Effect of Radiation HBO Treatment Details Treatment Number: 3 Patient Type: Outpatient Chamber Type: Monoplace Chamber Serial #: [id number] Treatment Protocol: 2.0 ATA with 90 minutes oxygen, and no air breaks Treatment Details Compression Rate Down: 1.0 psi / minute De-Compression Rate Up: 1.5 psi / minute Air breaks and breathing Decompress Decompress Compress Tx Pressure Begins Reached periods Begins Ends (leave unused spaces blank) Chamber Pressure (ATA 1 2 ------2 1 ) Clock Time (24 hr) 10:14 10:07 - - - - - - 11:37 11:46 Treatment Length: 92 (minutes) Treatment Segments: 3 Vital Signs Capillary Blood Glucose Reference Range: 80 - 120 mg / dl HBO Diabetic Blood Glucose Intervention Range: <131 mg/dl or >249 mg/dl Type: Time Vitals Blood Respiratory Capillary Blood Glucose Pulse Action Pulse: Temperature: Taken: Pressure: Rate: Glucose (mg/dl): Meter #: Oximetry (%) Taken: Pre 09:47 170/65 94 16 97.3 215 1 none per protocol Post 11:49 160/76 77 16 97.6 141 1 none per protocol Treatment Response Treatment Toleration: Well Treatment Completion Status: Treatment Completed without Adverse Event Physician HBO Attestation: I certify that I supervised this HBO treatment in accordance with  Medicare guidelines. A trained emergency response team is readily available per Yes hospital policies and procedures. Continue HBOT as ordered. Yes Electronic Signature(s) Signed: 04/17/2022 1:40:10 PM By: Dillon Shan DO Previous Signature: 04/13/2022 4:10:56 PM Version By: [name] CHT EMT BS , , Entered By: [name] on 04/14/2022 14:46:43 [name] ([id number]) [id number] Page 2 of 2 -------------------------------------------------------------------------------- HBO Safety Checklist Details Patient Name: Date of Service: [name]. 04/13/2022 10:00 A M Medical Record Number: [id number] Patient Account Number: [id number] Date of Birth/Sex: Treating RN: 1965-06-18 (56 y.o. [name] Primary Care [name]: [name] Other Clinician: [name] Referring [name]: Treating [name]/Extender: [name] in Treatment: 1 [name] Safety Checklist Consent Form Signed Patient voided / foley secured and emptied When did you last eato 0800 Last dose of injectable or oral agent 0815 Ostomy pouch emptied and vented if applicable NA All implantable devices assessed, documented and approved NA Intravenous access site secured and place NA Valuables secured Linens and cotton and cotton/polyester blend (less than 51% polyester) Personal oil-based products / skin lotions / body lotions removed Wigs or hairpieces removed Smoking or tobacco materials removed Books / newspapers / magazines / loose paper removed Cologne, aftershave, perfume and deodorant removed Jewelry removed (may wrap wedding band) Make-up removed NA Hair care products removed Battery operated devices (external) removed Heating patches and chemical warmers removed Titanium eyewear removed Nail polish cured greater than 10 hours NA Casting material cured greater than 10 hours NA Hearing aids  removed NA Loose dentures or partials removed NA Prosthetics have been removed NA Patient demonstrates correct use of air break device (if applicable) Patient concerns have been addressed Patient grounding bracelet on and cord attached to chamber Specifics for Inpatients (complete in addition to above) Medication sheet sent with  patient NA Intravenous medications needed or due during therapy sent with patient NA Drainage tubes ([name].g. nasogastric tube or chest tube secured and vented) NA Endotracheal or Tracheotomy tube secured NA Cuff deflated of air and inflated with saline NA Airway suctioned NA Notes Paper version used prior to treatment start. Electronic Signature(s) Signed: 04/13/2022 4:05:47 PM By: [name] CHT EMT BS , , Entered By: [name] on 04/13/2022 16:05:47

## 2022-04-14 ENCOUNTER — Encounter (HOSPITAL_BASED_OUTPATIENT_CLINIC_OR_DEPARTMENT_OTHER): Payer: BC Managed Care – PPO | Admitting: Internal Medicine

## 2022-04-14 DIAGNOSIS — R319 Hematuria, unspecified: Secondary | ICD-10-CM

## 2022-04-14 DIAGNOSIS — C61 Malignant neoplasm of prostate: Secondary | ICD-10-CM | POA: Diagnosis not present

## 2022-04-14 DIAGNOSIS — N3041 Irradiation cystitis with hematuria: Secondary | ICD-10-CM | POA: Diagnosis not present

## 2022-04-14 LAB — GLUCOSE, CAPILLARY
Glucose-Capillary: 154 mg/dL — ABNORMAL HIGH (ref 70–99)
Glucose-Capillary: 200 mg/dL — ABNORMAL HIGH (ref 70–99)

## 2022-04-14 NOTE — Progress Notes (Signed)
PERL, KERNEY ([id number]) [id number] Page 1 of 2 Visit Report for 04/14/2022 Arrival Information Details Patient Name: Date of Service: [name] MES E. 04/14/2022 10:00 A M Medical Record Number: [id number] Patient Account Number: [id number] Date of Birth/Sex: Treating RN: [birth year] (56 y.o. [name], Meta.Reding Primary Care [name]: [name] Other Clinician: [name] Referring [name]: Treating [name]/Extender: [name] in Treatment: 2 Visit Information History Since Last Visit All ordered tests and consults were completed: Yes Patient Arrived: Ambulatory Added or deleted any medications: No Arrival Time: 09:32 Any new allergies or adverse reactions: No Accompanied By: None Had a fall or experienced change in No Transfer Assistance: None activities of daily living that may affect Patient Identification Verified: Yes risk of falls: Secondary Verification Process Completed: Yes Signs or symptoms of abuse/neglect since last visito No Patient Requires Transmission-Based Precautions: No Hospitalized since last visit: No Patient Has Alerts: No Implantable device outside of the clinic excluding No cellular tissue based products placed in the center since last visit: Pain Present Now: No Electronic Signature(s) Signed: 04/14/2022 3:55:34 PM By: [name] EMT Entered By: [name] on 04/14/2022 15:55:34 -------------------------------------------------------------------------------- Encounter Discharge Information Details Patient Name: Date of Service: [name] MES E. 04/14/2022 10:00 A M Medical Record Number: [id number] Patient Account Number: [id number] Date of Birth/Sex: Treating RN: 1966/04/22 (56 y.o. [name] Primary Care [name]: [name] Other Clinician: [name] Referring Tranisha Tissue: Treating [name]/Extender: [name] in Treatment:  2 Encounter Discharge Information Items Discharge Condition: Stable Ambulatory Status: Ambulatory Discharge Destination: Home Transportation: Private Auto Accompanied By: None Schedule Follow-up Appointment: Yes Clinical Summary of Care: Electronic Signature(s) Signed: 04/14/2022 4:08:18 PM By: [name] EMT Entered By: [name] on 04/14/2022 16:08:17 [name] ([id number]) [id number] Page 2 of 2 -------------------------------------------------------------------------------- Vitals Details Patient Name: Date of Service: [name] MES E. 04/14/2022 10:00 A M Medical Record Number: [id number] Patient Account Number: [id number] Date of Birth/Sex: Treating RN: 10-30-1965 (56 y.o. [name] Primary Care [name]: [name] Other Clinician: [name] Referring [name]: Treating [name]/Extender: [name] in Treatment: 2 Vital Signs Time Taken: 10:09 Temperature (F): 97.2 Height (in): 70 Pulse (bpm): 94 Weight (lbs): 210 Respiratory Rate (breaths/min): 16 Body Mass Index (BMI): 30.1 Blood Pressure (mmHg): 141/88 Capillary Blood Glucose (mg/dl): 154 Reference Range: 80 - 120 mg / dl Electronic Signature(s) Signed: 04/14/2022 3:57:51 PM By: [name] EMT Entered By: [name] on 04/14/2022 15:57:51

## 2022-04-14 NOTE — Progress Notes (Addendum)
SMITH, POTENZA ([id number]) [id number] Page 1 of 2 Visit Report for 04/14/2022 HBO Details Patient Name: Date of Service: Dillon Dun MES [name]. 04/14/2022 10:00 A M Medical Record Number: [id number] Patient Account Number: [id number] Date of Birth/Sex: Treating RN: 04/03/1966 (56 y.o. [name] Primary Care Dillon Bethards: Dillon Bowen Other Clinician: [name] Referring [name]: Treating [name]/Extender: [name] in Treatment: 2 HBO Treatment Course Details Treatment Course Number: 1 Ordering [name]: [name] T Treatments Ordered: otal 40 HBO Treatment Start Date: 04/09/2022 HBO Indication: Late Effect of Radiation HBO Treatment Details Treatment Number: 4 Patient Type: Outpatient Chamber Type: Monoplace Chamber Serial #: [id number] Treatment Protocol: 2.0 ATA with 90 minutes oxygen, and no air breaks Treatment Details Compression Rate Down: 1.5 psi / minute De-Compression Rate Up: 2.0 psi / minute Air breaks and breathing Decompress Decompress Compress Tx Pressure Begins Reached periods Begins Ends (leave unused spaces blank) Chamber Pressure (ATA 1 2 ------2 1 ) Clock Time (24 hr) 10:23 10:34 - - - - - - 12:04 12:11 Treatment Length: 108 (minutes) Treatment Segments: 4 Vital Signs Capillary Blood Glucose Reference Range: 80 - 120 mg / dl HBO Diabetic Blood Glucose Intervention Range: <131 mg/dl or >249 mg/dl Time Vitals Blood Respiratory Capillary Blood Glucose Pulse Action Type: Pulse: Temperature: Taken: Pressure: Rate: Glucose (mg/dl): Meter #: Oximetry (%) Taken: Pre 10:09 141/88 94 16 97.2 154 Post 12:17 173/75 80 16 97.2 200 Treatment Response Treatment Toleration: Well Treatment Completion Status: Treatment Completed without Adverse Event Physician HBO Attestation: I certify that I supervised this HBO treatment in accordance with Medicare guidelines. A trained emergency response  team is readily available per Yes hospital policies and procedures. Continue HBOT as ordered. Yes Electronic Signature(s) Signed: 04/17/2022 1:40:10 PM By: Dillon Shan DO Previous Signature: 04/14/2022 4:06:46 PM Version By: [name] EMT Entered By: [name] on 04/14/2022 [place], [name] ([id number]) [id number] Page 2 of 2 -------------------------------------------------------------------------------- HBO Safety Checklist Details Patient Name: Date of Service: Dillon Dun MES [name]. 04/14/2022 10:00 A M Medical Record Number: [id number] Patient Account Number: [id number] Date of Birth/Sex: Treating RN: 10-27-1965 (56 y.o. [name], [name] Primary Care Froilan Mclean: Dillon Bowen Other Clinician: [name] Referring [name]: Treating [name]/Extender: [name] in Treatment: 2 [name] Safety Checklist Consent Form Signed Patient voided / foley secured and emptied When did you last eato 0830 Last dose of injectable or oral agent 0900 Ostomy pouch emptied and vented if applicable NA All implantable devices assessed, documented and approved NA Intravenous access site secured and place NA Valuables secured Linens and cotton and cotton/polyester blend (less than 51% polyester) Personal oil-based products / skin lotions / body lotions removed Wigs or hairpieces removed NA Smoking or tobacco materials removed Books / newspapers / magazines / loose paper removed Cologne, aftershave, perfume and deodorant removed Jewelry removed (may wrap wedding band) Make-up removed NA Hair care products removed Battery operated devices (external) removed Heating patches and chemical warmers removed Titanium eyewear removed NA Nail polish cured greater than 10 hours NA Casting material cured greater than 10 hours NA Hearing aids removed NA Loose dentures or partials removed NA Prosthetics  have been removed NA Patient demonstrates correct use of air break device (if applicable) Patient concerns have been addressed Patient grounding bracelet on and cord attached to chamber Specifics for Inpatients (complete in addition to above) Medication sheet sent with patient NA Intravenous medications needed or due during therapy sent  with patient NA Drainage tubes (Mooreg. nasogastric tube or chest tube secured and vented) NA Endotracheal or Tracheotomy tube secured NA Cuff deflated of air and inflated with saline NA Airway suctioned NA Notes The safety checklist was done before the treatment was started. Electronic Signature(s) Signed: 04/14/2022 4:03:08 PM By: [name] EMT Entered By: [name] on 04/14/2022 16:03:07

## 2022-04-15 ENCOUNTER — Encounter (HOSPITAL_BASED_OUTPATIENT_CLINIC_OR_DEPARTMENT_OTHER): Payer: BC Managed Care – PPO | Attending: General Surgery | Admitting: General Surgery

## 2022-04-15 DIAGNOSIS — N3041 Irradiation cystitis with hematuria: Secondary | ICD-10-CM | POA: Diagnosis not present

## 2022-04-15 DIAGNOSIS — C61 Malignant neoplasm of prostate: Secondary | ICD-10-CM | POA: Diagnosis not present

## 2022-04-15 DIAGNOSIS — Y842 Radiological procedure and radiotherapy as the cause of abnormal reaction of the patient, or of later complication, without mention of misadventure at the time of the procedure: Secondary | ICD-10-CM | POA: Insufficient documentation

## 2022-04-15 DIAGNOSIS — E103529 Type 1 diabetes mellitus with proliferative diabetic retinopathy with traction retinal detachment involving the macula, unspecified eye: Secondary | ICD-10-CM | POA: Diagnosis not present

## 2022-04-15 LAB — GLUCOSE, CAPILLARY: Glucose-Capillary: 184 mg/dL — ABNORMAL HIGH (ref 70–99)

## 2022-04-15 NOTE — Progress Notes (Signed)
LAVERNE, KLUGH ([id number]) [id number] Page 1 of 2 Visit Report for 04/15/2022 Problem List Details Patient Name: Date of Service: Dillon Dun MES E. 04/15/2022 10:00 A M Medical Record Number: [id number] Patient Account Number: [id number] Date of Birth/Sex: Treating RN: [birth year] (56 y.o. 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 04/15/2022 Medical Record Number: [id number] Patient Account Number: [id number] Date of Birth/Sex: Treating RN: [birth year] (56 y.o. [name] Primary Care Provider: [name] Other Clinician: [name] Referring Provider: Treating Provider/Extender: [name] in Treatment: 2 Diagnosis Coding ICD-10 Codes Code Description N30.41 Irradiation cystitis with hematuria [name], [name] ([id number]) [id number] Page 2 of 2 R31.9 Hematuria, unspecified C61 Malignant neoplasm of prostate E10.3529 Type 1 diabetes mellitus with proliferative diabetic retinopathy  with traction retinal detachment involving the macula, unspecified eye Facility Procedures : CPT4 Code: 02409735 Description: G0277-(Facility Use Only) HBOT full body chamber, 31mn , ICD-10 Diagnosis Description N30.41 Irradiation cystitis with hematuria C61 Malignant neoplasm of prostate R31.9 Hematuria, unspecified Modifier: Quantity: 4 Physician Procedures : CPT4 Code Description Modifier [id number]- WC PHYS HYPERBARIC OXYGEN THERAPY ICD-10 Diagnosis Description N30.41 Irradiation cystitis with hematuria C61 Malignant neoplasm of prostate R31.9 Hematuria, unspecified Quantity: 1 Electronic Signature(s) Signed: 04/15/2022 2:17:11 PM By: GValeria BatmanEMT Signed: 04/15/2022 3:43:37 PM By: CFredirick MaudlinMD FACS Entered By: GValeria Batmanon 04/15/2022 14:17:11

## 2022-04-15 NOTE — Progress Notes (Addendum)
PARTH, MCCORMAC ([id number]) [id number] Page 1 of 2 Visit Report for 04/15/2022 HBO Details Patient Name: Date of Service: [name] MES E. 04/15/2022 10:00 A M Medical Record Number: [id number] Patient Account Number: [id number] Date of Birth/Sex: Treating RN: Mar 01, 1966 (56 y.o. [name] Primary Care Quilla Freeze: [name] Other Clinician: [name] Referring Jakevion Arney: Treating Skiler Tye/Extender: [name] in Treatment: 2 HBO Treatment Course Details Treatment Course Number: 1 Ordering Paizlie Klaus: [name] T Treatments Ordered: otal 40 HBO Treatment Start Date: 04/09/2022 HBO Indication: Late Effect of Radiation HBO Treatment Details Treatment Number: 5 Patient Type: Outpatient Chamber Type: Monoplace Chamber Serial #: [id number] Treatment Protocol: 2.0 ATA with 90 minutes oxygen, and no air breaks Treatment Details Compression Rate Down: 1.5 psi / minute De-Compression Rate Up: 2.0 psi / minute Air breaks and breathing Decompress Decompress Compress Tx Pressure Begins Reached periods Begins Ends (leave unused spaces blank) Chamber Pressure (ATA 1 2 ------2 1 ) Clock Time (24 hr) 10:42 10:52 - - - - - - 12:22 12:30 Treatment Length: 108 (minutes) Treatment Segments: 4 Vital Signs Capillary Blood Glucose Reference Range: 80 - 120 mg / dl HBO Diabetic Blood Glucose Intervention Range: <131 mg/dl or >249 mg/dl Time Vitals Blood Respiratory Capillary Blood Glucose Pulse Action Type: Pulse: Temperature: Taken: Pressure: Rate: Glucose (mg/dl): Meter #: Oximetry (%) Taken: Pre 10:35 135/73 91 18 98.2 184 Post 12:36 133/97 81 16 97.3 131 Treatment Response Treatment Toleration: Well Treatment Completion Status: Treatment Completed without Adverse Event Physician HBO Attestation: I certify that I supervised this HBO treatment in accordance with Medicare guidelines. A trained emergency response team  is readily available per Yes hospital policies and procedures. Continue HBOT as ordered. Yes Electronic Signature(s) Signed: 04/15/2022 4:40:22 PM By: [name] MD FACS Previous Signature: 04/15/2022 2:16:35 PM Version By: [name] EMT Entered By: [name] on 04/15/2022 16:40:22 [name] ([id number]) [id number] Page 2 of 2 -------------------------------------------------------------------------------- HBO Safety Checklist Details Patient Name: Date of Service: [name] MES E. 04/15/2022 10:00 A M Medical Record Number: [id number] Patient Account Number: [id number] Date of Birth/Sex: Treating RN: 11/08/1965 (56 y.o. [name] Primary Care Kaelie Henigan: [name] Other Clinician: [name] Referring Pacey Altizer: Treating Graysen Depaula/Extender: [name] in Treatment: 2 [name] Safety Checklist Consent Form Signed Patient voided / foley secured and emptied When did you last eato 0830 Last dose of injectable or oral agent 0900 Ostomy pouch emptied and vented if applicable NA All implantable devices assessed, documented and approved NA Intravenous access site secured and place NA Valuables secured Linens and cotton and cotton/polyester blend (less than 51% polyester) Personal oil-based products / skin lotions / body lotions removed Wigs or hairpieces removed NA Smoking or tobacco materials removed Books / newspapers / magazines / loose paper removed Cologne, aftershave, perfume and deodorant removed Jewelry removed (may wrap wedding band) Make-up removed NA Hair care products removed Battery operated devices (external) removed Heating patches and chemical warmers removed Titanium eyewear removed NA Nail polish cured greater than 10 hours NA Casting material cured greater than 10 hours NA Hearing aids removed NA Loose dentures or partials removed NA Prosthetics have  been removed NA Patient demonstrates correct use of air break device (if applicable) Patient concerns have been addressed Patient grounding bracelet on and cord attached to chamber Specifics for Inpatients (complete in addition to above) Medication sheet sent with patient NA Intravenous medications needed or due during therapy  sent with patient NA Drainage tubes (e.g. nasogastric tube or chest tube secured and vented) NA Endotracheal or Tracheotomy tube secured NA Cuff deflated of air and inflated with saline NA Airway suctioned NA Notes The safety checklist was done before the treatment was started. Electronic Signature(s) Signed: 04/15/2022 2:14:30 PM By: [name] EMT Entered By: [name] on 04/15/2022 14:14:29

## 2022-04-15 NOTE — Progress Notes (Signed)
KHALIK, PEWITT ([id number]) [id number] Page 1 of 2 Visit Report for 04/15/2022 Arrival Information Details Patient Name: Date of Service: [name] MES E. 04/15/2022 10:00 A M Medical Record Number: [id number] Patient Account Number: [id number] Date of Birth/Sex: Treating RN: 1965-08-19 (56 y.o. [name] Primary Care Thatiana Renbarger: [name] Other Clinician: [name] Referring Khadar Monger: Treating Safa Derner/Extender: [name] in Treatment: 2 Visit Information History Since Last Visit All ordered tests and consults were completed: Yes Patient Arrived: Ambulatory Added or deleted any medications: No Arrival Time: 09:31 Any new allergies or adverse reactions: No Accompanied By: None Had a fall or experienced change in No Transfer Assistance: None activities of daily living that may affect Patient Identification Verified: Yes risk of falls: Secondary Verification Process Completed: Yes Signs or symptoms of abuse/neglect since last visito No Patient Requires Transmission-Based Precautions: No Hospitalized since last visit: No Patient Has Alerts: No Implantable device outside of the clinic excluding No cellular tissue based products placed in the center since last visit: Pain Present Now: No Electronic Signature(s) Signed: 04/15/2022 2:13:04 PM By: [name] EMT Entered By: [name] on 04/15/2022 14:13:04 -------------------------------------------------------------------------------- Encounter Discharge Information Details Patient Name: Date of Service: [name] MES E. 04/15/2022 10:00 A M Medical Record Number: [id number] Patient Account Number: [id number] Date of Birth/Sex: Treating RN: December 16, 1965 (56 y.o. [name] Primary Care Burlie Cajamarca: [name] Other Clinician: [name] Referring Mal Asher: Treating Kaylin Marcon/Extender: [name] in Treatment:  2 Encounter Discharge Information Items Discharge Condition: Stable Ambulatory Status: Ambulatory Discharge Destination: Home Transportation: Private Auto Accompanied By: None Schedule Follow-up Appointment: Yes Clinical Summary of Care: Electronic Signature(s) Signed: 04/15/2022 2:17:44 PM By: [name] EMT Entered By: [name] on 04/15/2022 14:17:44 [name] ([id number]) [id number] Page 2 of 2 -------------------------------------------------------------------------------- Vitals Details Patient Name: Date of Service: [name] MES E. 04/15/2022 10:00 A M Medical Record Number: [id number] Patient Account Number: [id number] Date of Birth/Sex: Treating RN: [birth year] (56 y.o. [name] Primary Care Abbygael Curtiss: [name] Other Clinician: [name] Referring Kendria Halberg: Treating Qunisha Bryk/Extender: [name] in Treatment: 2 Vital Signs Time Taken: 10:35 Temperature (F): 98.2 Height (in): 70 Pulse (bpm): 91 Weight (lbs): 210 Respiratory Rate (breaths/min): 18 Body Mass Index (BMI): 30.1 Blood Pressure (mmHg): 135/73 Capillary Blood Glucose (mg/dl): 184 Reference Range: 80 - 120 mg / dl Electronic Signature(s) Signed: 04/15/2022 2:13:40 PM By: [name] EMT Entered By: [name] on 04/15/2022 14:13:40

## 2022-04-16 ENCOUNTER — Encounter (HOSPITAL_BASED_OUTPATIENT_CLINIC_OR_DEPARTMENT_OTHER): Payer: BC Managed Care – PPO | Admitting: General Surgery

## 2022-04-16 DIAGNOSIS — N3041 Irradiation cystitis with hematuria: Secondary | ICD-10-CM | POA: Diagnosis not present

## 2022-04-16 NOTE — Progress Notes (Signed)
CHENG, DEC ([id number]) [id number] Page 1 of 2 Visit Report for 04/16/2022 HBO Details Patient Name: Date of Service: [name]. 04/16/2022 10:00 A M Medical Record Number: [id number] Patient Account Number: [id number] Date of Birth/Sex: Treating RN: 1965/09/23 (56 y.o. [name] Primary Care Calvina Liptak: [name] Other Clinician: [name] Referring Phaedra Colgate: Treating Alyxandra Tenbrink/Extender: [name] in Treatment: 2 HBO Treatment Course Details Treatment Course Number: 1 Ordering Chapel Silverthorn: [name] T Treatments Ordered: otal 40 HBO Treatment Start Date: 04/09/2022 HBO Indication: Late Effect of Radiation HBO Treatment Details Treatment Number: 6 Patient Type: Outpatient Chamber Type: Monoplace Chamber Serial #: [id number] Treatment Protocol: 2.0 ATA with 90 minutes oxygen, and no air breaks Treatment Details Compression Rate Down: 2.0 psi / minute De-Compression Rate Up: 2.0 psi / minute Air breaks and breathing Decompress Decompress Compress Tx Pressure Begins Reached periods Begins Ends (leave unused spaces blank) Chamber Pressure (ATA 1 2 ------2 1 ) Clock Time (24 hr) 11:01 11:10 - - - - - - 12:41 12:49 Treatment Length: 108 (minutes) Treatment Segments: 4 Vital Signs Capillary Blood Glucose Reference Range: 80 - 120 mg / dl HBO Diabetic Blood Glucose Intervention Range: <131 mg/dl or >249 mg/dl Type: Time Vitals Blood Pulse: Respiratory Capillary Blood Glucose Pulse Action Temperature: Taken: Pressure: Rate: Glucose (mg/dl): Meter #: Oximetry (%) Taken: Pre 10:29 144/75 93 18 97.2 117 Patient given 8 oz glucerna Post 12:56 136/78 85 16 97.2 150 Pre 10:56 133 Treatment Response Treatment Toleration: Well Treatment Completion Status: Treatment Completed without Adverse Event Treatment Notes The patient Blood sugar on arrival was 117. The patient was given 8 oz Glucerna. Blood  sugar was rechecked at 1056 and was 133. Treatment started. No problems noted during the treatment. Physician HBO Attestation: I certify that I supervised this HBO treatment in accordance with Medicare guidelines. A trained emergency response team is readily available per Yes hospital policies and procedures. Continue HBOT as ordered. Yes Electronic Signature(s) Signed: 04/16/2022 3:46:07 PM By: [name] MD FACS Previous Signature: 04/16/2022 2:22:14 PM Version By: [name] EMT Entered By: [name] on 04/16/2022 15:46:06 [name] ([id number]) [id number] Page 2 of 2 -------------------------------------------------------------------------------- HBO Safety Checklist Details Patient Name: Date of Service: [name]. 04/16/2022 10:00 A M Medical Record Number: [id number] Patient Account Number: [id number] Date of Birth/Sex: Treating RN: 1965-08-02 (56 y.o. [name] Primary Care Demetria Iwai: [name] Other Clinician: [name] Referring Niana Martorana: Treating Davier Tramell/Extender: [name] in Treatment: 2 [name] Safety Checklist Consent Form Signed Patient voided / foley secured and emptied When did you last eato 0830 Last dose of injectable or oral agent 0900 Ostomy pouch emptied and vented if applicable NA All implantable devices assessed, documented and approved Libre2 Intravenous access site secured and place NA Valuables secured Linens and cotton and cotton/polyester blend (less than 51% polyester) Personal oil-based products / skin lotions / body lotions removed Wigs or hairpieces removed NA Smoking or tobacco materials removed Books / newspapers / magazines / loose paper removed Cologne, aftershave, perfume and deodorant removed Jewelry removed (may wrap wedding band) Make-up removed NA Hair care products removed Battery operated devices (external)  removed Heating patches and chemical warmers removed Titanium eyewear removed NA Nail polish cured greater than 10 hours NA Casting material cured greater than 10 hours NA Hearing aids removed NA Loose dentures or partials removed NA Prosthetics have been removed NA Patient demonstrates correct  use of air break device (if applicable) Patient concerns have been addressed Patient grounding bracelet on and cord attached to chamber Specifics for Inpatients (complete in addition to above) Medication sheet sent with patient NA Intravenous medications needed or due during therapy sent with patient NA Drainage tubes ([name].g. nasogastric tube or chest tube secured and vented) NA Endotracheal or Tracheotomy tube secured NA Cuff deflated of air and inflated with saline NA Airway suctioned NA Notes The safety checklist was done before the treatment was started. Electronic Signature(s) Signed: 04/16/2022 2:16:27 PM By: [name] EMT Entered By: [name] on 04/16/2022 14:16:26

## 2022-04-16 NOTE — Progress Notes (Signed)
CORDARREL, STIEFEL ([id number]) [id number] Page 1 of 2 Visit Report for 04/16/2022 Arrival Information Details Patient Name: Date of Service: [name] MES E. 04/16/2022 10:00 A M Medical Record Number: [id number] Patient Account Number: [id number] Date of Birth/Sex: Treating RN: 12-31-1965 (56 y.o. [name] Primary Care Cezar Misiaszek: [name] Other Clinician: [name] Referring Charels Stambaugh: Treating Kamyra Schroeck/Extender: [name] in Treatment: 2 Visit Information History Since Last Visit All ordered tests and consults were completed: Yes Patient Arrived: Ambulatory Added or deleted any medications: No Arrival Time: 09:31 Any new allergies or adverse reactions: No Accompanied By: None Had a fall or experienced change in No Transfer Assistance: None activities of daily living that may affect Patient Identification Verified: Yes risk of falls: Secondary Verification Process Completed: Yes Signs or symptoms of abuse/neglect since last visito No Patient Requires Transmission-Based Precautions: No Hospitalized since last visit: No Patient Has Alerts: No Implantable device outside of the clinic excluding No cellular tissue based products placed in the center since last visit: Pain Present Now: No Electronic Signature(s) Signed: 04/16/2022 2:14:23 PM By: [name] EMT Entered By: [name] on 04/16/2022 14:14:23 -------------------------------------------------------------------------------- Encounter Discharge Information Details Patient Name: Date of Service: [name] MES E. 04/16/2022 10:00 A M Medical Record Number: [id number] Patient Account Number: [id number] Date of Birth/Sex: Treating RN: 06/29/1965 (56 y.o. [name] Primary Care Ansar Skoda: [name] Other Clinician: [name] Referring Roshunda Keir: Treating Amaani Guilbault/Extender: [name] in Treatment:  2 Encounter Discharge Information Items Discharge Condition: Stable Ambulatory Status: Ambulatory Discharge Destination: Home Transportation: Private Auto Accompanied By: None Schedule Follow-up Appointment: Yes Clinical Summary of Care: Electronic Signature(s) Signed: 04/16/2022 2:23:08 PM By: [name] EMT Entered By: [name] on 04/16/2022 14:23:07 [name] ([id number]) [id number] Page 2 of 2 -------------------------------------------------------------------------------- Vitals Details Patient Name: Date of Service: [name] MES E. 04/16/2022 10:00 A M Medical Record Number: [id number] Patient Account Number: [id number] Date of Birth/Sex: Treating RN: 04-21-1966 (56 y.o. [name] Primary Care Cris Gibby: [name] Other Clinician: [name] Referring Taesha Goodell: Treating Heydi Swango/Extender: [name] in Treatment: 2 Vital Signs Time Taken: 10:29 Temperature (F): 97.2 Height (in): 70 Pulse (bpm): 93 Weight (lbs): 210 Respiratory Rate (breaths/min): 18 Body Mass Index (BMI): 30.1 Blood Pressure (mmHg): 144/75 Capillary Blood Glucose (mg/dl): 117 Reference Range: 80 - 120 mg / dl Electronic Signature(s) Signed: 04/16/2022 2:15:02 PM By: [name] EMT Entered By: [name] on 04/16/2022 14:15:01

## 2022-04-16 NOTE — Progress Notes (Signed)
DONIE, LEMELIN ([id number]) [id number] Page 1 of 2 Visit Report for 04/16/2022 Problem List Details Patient Name: Date of Service: Dillon Dun MES E. 04/16/2022 10:00 A M Medical Record Number: [id number] Patient Account Number: [id number] Date of Birth/Sex: Treating RN: Jan 04, 1966 (56 y.o. 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 04/16/2022 Medical Record Number: [id number] Patient Account Number: [id number] Date of Birth/Sex: Treating RN: 1965/12/03 (56 y.o. [name] Primary Care Provider: [name] Other Clinician: [name] Referring Provider: Treating Provider/Extender: [name] in Treatment: 2 Diagnosis Coding ICD-10 Codes Code Description N30.41 Irradiation cystitis with hematuria [name], [name] ([id number]) [id number] Page 2 of 2 R31.9 Hematuria, unspecified C61 Malignant neoplasm of prostate E10.3529 Type 1 diabetes mellitus with proliferative diabetic  retinopathy with traction retinal detachment involving the macula, unspecified eye Facility Procedures : CPT4 Code: [id number] Description: G0277-(Facility Use Only) HBOT full body chamber, 30mn , ICD-10 Diagnosis Description 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 Hematuria, unspecified C61 Malignant neoplasm of prostate Modifier: Quantity: 4 Physician Procedures : CPT4 Code Description Modifier [id number] 19509- WC PHYS HYPERBARIC OXYGEN THERAPY ICD-10 Diagnosis Description 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 Hematuria, unspecified C61 Malignant neoplasm of prostate Quantity: 1 Electronic Signature(s) Signed: 04/16/2022 2:22:32 PM By: GValeria BatmanEMT Signed: 04/16/2022 3:43:56 PM By: CFredirick MaudlinMD FACS Entered By: GValeria Batmanon 04/16/2022 14:22:32

## 2022-04-17 ENCOUNTER — Encounter (HOSPITAL_BASED_OUTPATIENT_CLINIC_OR_DEPARTMENT_OTHER): Payer: BC Managed Care – PPO | Admitting: General Surgery

## 2022-04-17 DIAGNOSIS — N3041 Irradiation cystitis with hematuria: Secondary | ICD-10-CM | POA: Diagnosis not present

## 2022-04-17 LAB — GLUCOSE, CAPILLARY
Glucose-Capillary: 131 mg/dL — ABNORMAL HIGH (ref 70–99)
Glucose-Capillary: 117 mg/dL — ABNORMAL HIGH (ref 70–99)
Glucose-Capillary: 133 mg/dL — ABNORMAL HIGH (ref 70–99)
Glucose-Capillary: 150 mg/dL — ABNORMAL HIGH (ref 70–99)
Glucose-Capillary: 141 mg/dL — ABNORMAL HIGH (ref 70–99)
Glucose-Capillary: 163 mg/dL — ABNORMAL HIGH (ref 70–99)

## 2022-04-17 NOTE — Progress Notes (Signed)
JADIER, ROCKERS ([id number]) [id number] Page 1 of 1 Visit Report for 04/13/2022 SuperBill Details Patient Name: Date of Service: Dillon Dun MES E. 04/13/2022 Medical Record Number: [id number] Patient Account Number: [id number] Date of Birth/Sex: Treating RN: 1965-11-05 (56 y.o. 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, E10.3529 unspecified eye Facility Procedures CPT4 Code Description Modifier Quantity [id number] G0277-(Facility Use Only) HBOT full body chamber, 74mn , 3 ICD-10 Diagnosis Description 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 Hematuria, unspecified C61 Malignant neoplasm of prostate Physician Procedures Quantity CPT4 Code Description Modifier [id number] 76808- WC PHYS HYPERBARIC OXYGEN THERAPY 1 ICD-10 Diagnosis Description 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 Hematuria, unspecified C61 Malignant neoplasm of prostate Electronic Signature(s) Signed: 04/13/2022 4:16:23 PM By: SDonavan BurnetCHT EMT BS , , Signed: 04/17/2022 1:40:10 PM By: HKalman ShanDO Entered By: SDonavan Burneton 04/13/2022 16:16:23

## 2022-04-17 NOTE — Progress Notes (Signed)
OBERON, HEHIR ([id number]) [id number] Page 1 of 2 Visit Report for 04/14/2022 Problem List Details Patient Name: Date of Service: Dillon Dun MES E. 04/14/2022 10:00 A M Medical Record Number: [id number] Patient Account Number: [id number] Date of Birth/Sex: Treating RN: [birth year] (56 y.o. 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 04/14/2022 Medical Record Number: [id number] Patient Account Number: [id number] Date of Birth/Sex: Treating RN: 16-Feb-1966 (56 y.o. [name] Primary Care Provider: [name] Other Clinician: [name] Referring Provider: Treating Provider/Extender: [name] in Treatment: 2 Diagnosis Coding ICD-10 Codes Code Description N30.41 Irradiation cystitis with hematuria [name], [name] ([id number]) [id number] Page 2 of 2 R31.9 Hematuria, unspecified C61 Malignant neoplasm of prostate E10.3529 Type 1 diabetes mellitus with proliferative diabetic retinopathy  with traction retinal detachment involving the macula, unspecified eye Facility Procedures : CPT4 Code: 31517616 Description: G0277-(Facility Use Only) HBOT full body chamber, 17mn , ICD-10 Diagnosis Description N30.41 Irradiation cystitis with hematuria C61 Malignant neoplasm of prostate R31.9 Hematuria, unspecified Modifier: Quantity: 4 Physician Procedures : CPT4 Code Description Modifier [id number]- WC PHYS HYPERBARIC OXYGEN THERAPY ICD-10 Diagnosis Description N30.41 Irradiation cystitis with hematuria C61 Malignant neoplasm of prostate R31.9 Hematuria, unspecified Quantity: 1 Electronic Signature(s) Signed: 04/14/2022 4:07:39 PM By: GValeria BatmanEMT Signed: 04/17/2022 1:40:10 PM By: HKalman ShanDO Entered By: GValeria Batmanon 04/14/2022 16:07:39

## 2022-04-17 NOTE — Progress Notes (Signed)
Dillon, [name] ([id number]) [id number] Page 1 of 2 Visit Report for 04/17/2022 Arrival Information Details Patient Name: Date of Service: [name] MES E. 04/17/2022 10:00 A M Medical Record Number: [id number] Patient Account Number: [id number] Date of Birth/Sex: Treating RN: [birth year] (56 y.o. [name] Primary Care [name]: [name] Other Clinician: [name] Referring [name]: Treating [name]/Extender: [name] in Treatment: 2 Visit Information History Since Last Visit All ordered tests and consults were completed: Yes Patient Arrived: Ambulatory Added or deleted any medications: No Arrival Time: 09:31 Any new allergies or adverse reactions: No Accompanied By: None Had a fall or experienced change in No Transfer Assistance: None activities of daily living that may affect Patient Identification Verified: Yes risk of falls: Secondary Verification Process Completed: Yes Signs or symptoms of abuse/neglect since last visito No Patient Requires Transmission-Based Precautions: No Hospitalized since last visit: No Patient Has Alerts: No Implantable device outside of the clinic excluding No cellular tissue based products placed in the center since last visit: Pain Present Now: No Electronic Signature(s) Signed: 04/17/2022 3:55:01 PM By: [name] EMT Entered By: [name] on 04/17/2022 15:55:01 -------------------------------------------------------------------------------- Encounter Discharge Information Details Patient Name: Date of Service: [name] MES E. 04/17/2022 10:00 A M Medical Record Number: [id number] Patient Account Number: [id number] Date of Birth/Sex: Treating RN: 11-May-1966 (56 y.o. [name] Primary Care [name]: [name] Other Clinician: [name] Referring Cagney Steenson: Treating Verlyn Lambert/Extender: [name] in  Treatment: 2 Encounter Discharge Information Items Discharge Condition: Stable Ambulatory Status: Ambulatory Discharge Destination: Home Transportation: Private Auto Accompanied By: None Schedule Follow-up Appointment: Yes Clinical Summary of Care: Electronic Signature(s) Signed: 04/17/2022 3:59:20 PM By: [name] EMT Entered By: [name] on 04/17/2022 15:59:20 [name] ([id number]) [id number] Page 2 of 2 -------------------------------------------------------------------------------- Vitals Details Patient Name: Date of Service: [name] MES E. 04/17/2022 10:00 A M Medical Record Number: [id number] Patient Account Number: [id number] Date of Birth/Sex: Treating RN: June 23, 1965 (56 y.o. [name] Primary Care [name]: [name] Other Clinician: [name] Referring [name]: Treating Dajanique Robley/Extender: [name] in Treatment: 2 Vital Signs Time Taken: 09:36 Temperature (F): 97.2 Height (in): 70 Pulse (bpm): 97 Weight (lbs): 210 Respiratory Rate (breaths/min): 18 Body Mass Index (BMI): 30.1 Blood Pressure (mmHg): 154/72 Capillary Blood Glucose (mg/dl): 141 Reference Range: 80 - 120 mg / dl Electronic Signature(s) Signed: 04/17/2022 3:55:38 PM By: [name] EMT Entered By: [name] on 04/17/2022 15:55:37

## 2022-04-17 NOTE — Progress Notes (Signed)
KAYA, KLAUSING ([id number]) [id number] Page 1 of 2 Visit Report for 04/17/2022 Problem List Details Patient Name: Date of Service: [name] MES E. 04/17/2022 10:00 A M Medical Record Number: [id number] Patient Account Number: [id number] Date of Birth/Sex: Treating RN: 1966/04/14 (56 y.o. 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 04/17/2022 Medical Record Number: [id number] Patient Account Number: [id number] Date of Birth/Sex: Treating RN: Mar 09, 1966 (56 y.o. [name] Primary Care Provider: [name] Other Clinician: [name] Referring Provider: Treating Provider/Extender: [name] in Treatment: 2 Diagnosis Coding ICD-10 Codes Code Description N30.41 Irradiation cystitis with hematuria MACRAE, WIEGMAN ([id number]) [id number] Page 2 of 2 R31.9 Hematuria, unspecified C61 Malignant neoplasm of prostate E10.3529 Type 1 diabetes mellitus with proliferative diabetic  retinopathy with traction retinal detachment involving the macula, unspecified eye Facility Procedures : CPT4 Code: 66063016 Description: G0277-(Facility Use Only) HBOT full body chamber, 69mn , ICD-10 Diagnosis Description 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 Hematuria, unspecified C61 Malignant neoplasm of prostate Modifier: Quantity: 4 Physician Procedures : CPT4 Code Description Modifier [id number]- WC PHYS HYPERBARIC OXYGEN THERAPY ICD-10 Diagnosis Description 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 Hematuria, unspecified C61 Malignant neoplasm of prostate Quantity: 1 Electronic Signature(s) Signed: 04/17/2022 3:58:18 PM By: GValeria BatmanEMT Signed: 04/17/2022 4:27:08 PM By: CFredirick MaudlinMD FACS Entered By: GValeria Batmanon 04/17/2022 15:58:18

## 2022-04-17 NOTE — Progress Notes (Signed)
TEO, MOEDE ([id number]) [id number] Page 1 of 2 Visit Report for 04/17/2022 HBO Details Patient Name: Date of Service: [name] MES E. 04/17/2022 10:00 A M Medical Record Number: [id number] Patient Account Number: [id number] Date of Birth/Sex: Treating RN: [birth year] (56 y.o. [name] Primary Care Cecia Egge: [name] Other Clinician: [name] Referring Gordy Goar: Treating Seichi Kaufhold/Extender: [name] in Treatment: 2 HBO Treatment Course Details Treatment Course Number: 1 Ordering Jerrid Forgette: [name] T Treatments Ordered: otal 40 HBO Treatment Start Date: 04/09/2022 HBO Indication: Late Effect of Radiation HBO Treatment Details Treatment Number: 7 Patient Type: Outpatient Chamber Type: Monoplace Chamber Serial #: [id number] Treatment Protocol: 2.0 ATA with 90 minutes oxygen, and no air breaks Treatment Details Compression Rate Down: 2.0 psi / minute De-Compression Rate Up: 2.0 psi / minute Air breaks and breathing Decompress Decompress Compress Tx Pressure Begins Reached periods Begins Ends (leave unused spaces blank) Chamber Pressure (ATA 1 2 ------2 1 ) Clock Time (24 hr) 10:14 10:22 - - - - - - 11:52 12:00 Treatment Length: 106 (minutes) Treatment Segments: 4 Vital Signs Capillary Blood Glucose Reference Range: 80 - 120 mg / dl HBO Diabetic Blood Glucose Intervention Range: <131 mg/dl or >249 mg/dl Time Vitals Blood Respiratory Capillary Blood Glucose Pulse Action Type: Pulse: Temperature: Taken: Pressure: Rate: Glucose (mg/dl): Meter #: Oximetry (%) Taken: Pre 09:36 154/72 97 18 97.2 141 Post 12:06 161/89 80 16 97.5 163 Treatment Response Treatment Toleration: Well Treatment Completion Status: Treatment Completed without Adverse Event Physician HBO Attestation: I certify that I supervised this HBO treatment in accordance with Medicare guidelines. A trained emergency response  team is readily available per Yes hospital policies and procedures. Continue HBOT as ordered. Yes Electronic Signature(s) Signed: 04/17/2022 4:28:36 PM By: [name] MD FACS Previous Signature: 04/17/2022 3:57:59 PM Version By: [name] EMT Entered By: [name] on 04/17/2022 16:28:35 [name] ([id number]) [id number] Page 2 of 2 -------------------------------------------------------------------------------- HBO Safety Checklist Details Patient Name: Date of Service: [name] MES E. 04/17/2022 10:00 A M Medical Record Number: [id number] Patient Account Number: [id number] Date of Birth/Sex: Treating RN: 11/01/1965 (56 y.o. [name] Primary Care Honesty Menta: [name] Other Clinician: [name] Referring Neils Siracusa: Treating Sonnie Bias/Extender: [name] in Treatment: 2 [name] Safety Checklist Consent Form Signed Patient voided / foley secured and emptied When did you last eato 0800 Last dose of injectable or oral agent 0800 Ostomy pouch emptied and vented if applicable NA All implantable devices assessed, documented and approved NA Intravenous access site secured and place NA Valuables secured Linens and cotton and cotton/polyester blend (less than 51% polyester) Personal oil-based products / skin lotions / body lotions removed Wigs or hairpieces removed NA Smoking or tobacco materials removed Books / newspapers / magazines / loose paper removed Cologne, aftershave, perfume and deodorant removed Jewelry removed (may wrap wedding band) Make-up removed NA Hair care products removed Battery operated devices (external) removed Heating patches and chemical warmers removed Titanium eyewear removed NA Nail polish cured greater than 10 hours NA Casting material cured greater than 10 hours NA Hearing aids removed NA Loose dentures or partials  removed NA Prosthetics have been removed NA Patient demonstrates correct use of air break device (if applicable) Patient concerns have been addressed Patient grounding bracelet on and cord attached to chamber Specifics for Inpatients (complete in addition to above) Medication sheet sent with patient NA Intravenous medications needed or due during therapy  sent with patient NA Drainage tubes (e.g. nasogastric tube or chest tube secured and vented) NA Endotracheal or Tracheotomy tube secured NA Cuff deflated of air and inflated with saline NA Airway suctioned NA Notes The safety checklist was done before the treatment was started. Electronic Signature(s) Signed: 04/17/2022 3:56:39 PM By: [name] EMT Entered By: [name] on 04/17/2022 15:56:38

## 2022-04-20 ENCOUNTER — Encounter (HOSPITAL_BASED_OUTPATIENT_CLINIC_OR_DEPARTMENT_OTHER): Payer: BC Managed Care – PPO | Admitting: General Surgery

## 2022-04-20 DIAGNOSIS — N3041 Irradiation cystitis with hematuria: Secondary | ICD-10-CM | POA: Diagnosis not present

## 2022-04-20 LAB — GLUCOSE, CAPILLARY
Glucose-Capillary: 198 mg/dL — ABNORMAL HIGH (ref 70–99)
Glucose-Capillary: 212 mg/dL — ABNORMAL HIGH (ref 70–99)

## 2022-04-20 NOTE — Progress Notes (Signed)
DAWIT, TANKARD ([id number]) [id number] Page 1 of 2 Visit Report for 04/20/2022 Arrival Information Details Patient Name: Date of Service: [name] 04/20/2022 10:00 A M Medical Record Number: [id number] Patient Account Number: [id number] Date of Birth/Sex: Treating RN: 07-Aug-1965 (56 y.o. [name] Primary Care Kline Bulthuis: [name] Other Clinician: [name] Referring Nani Ingram: Treating Nobie Alleyne/Extender: [name] in Treatment: 2 Visit Information History Since Last Visit All ordered tests and consults were completed: Yes Patient Arrived: Ambulatory Added or deleted any medications: No Arrival Time: 09:33 Any new allergies or adverse reactions: No Accompanied By: None Had a fall or experienced change in No Transfer Assistance: None activities of daily living that may affect Patient Identification Verified: Yes risk of falls: Secondary Verification Process Completed: Yes Signs or symptoms of abuse/neglect since last visito No Patient Requires Transmission-Based Precautions: No Hospitalized since last visit: No Patient Has Alerts: No Implantable device outside of the clinic excluding No cellular tissue based products placed in the center since last visit: Pain Present Now: No Electronic Signature(s) Signed: 04/20/2022 2:35:29 PM By: [name] EMT Entered By: [name] on 04/20/2022 14:35:29 -------------------------------------------------------------------------------- Encounter Discharge Information Details Patient Name: Date of Service: [name] 04/20/2022 10:00 A M Medical Record Number: [id number] Patient Account Number: [id number] Date of Birth/Sex: Treating RN: 01/13/1966 (56 y.o. [name] Primary Care Takisha Pelle: [name] Other Clinician: [name] Referring Angelette Ganus: Treating Nichael Ehly/Extender: [name] in Treatment:  2 Encounter Discharge Information Items Discharge Condition: Stable Ambulatory Status: Ambulatory Discharge Destination: Home Transportation: Private Auto Accompanied By: None Schedule Follow-up Appointment: Yes Clinical Summary of Care: Electronic Signature(s) Signed: 04/20/2022 2:55:04 PM By: [name] EMT Entered By: [name] on 04/20/2022 14:55:03 [name] ([id number]) [id number] Page 2 of 2 -------------------------------------------------------------------------------- Vitals Details Patient Name: Date of Service: [name] 04/20/2022 10:00 A M Medical Record Number: [id number] Patient Account Number: [id number] Date of Birth/Sex: Treating RN: Nov 11, 1965 (56 y.o. [name] Primary Care Karren Newland: [name] Other Clinician: [name] Referring Arney Mayabb: Treating Kristie Bracewell/Extender: [name] in Treatment: 2 Vital Signs Time Taken: 09:47 Temperature (F): 97.2 Height (in): 70 Pulse (bpm): 96 Weight (lbs): 210 Respiratory Rate (breaths/min): 18 Body Mass Index (BMI): 30.1 Blood Pressure (mmHg): 133/65 Capillary Blood Glucose (mg/dl): 198 Reference Range: 80 - 120 mg / dl Electronic Signature(s) Signed: 04/20/2022 2:35:57 PM By: [name] EMT Entered By: [name] on 04/20/2022 14:35:56

## 2022-04-20 NOTE — Progress Notes (Addendum)
[name], [name] ([id number]) [id number] Page 1 of 2 Visit Report for 04/20/2022 HBO Details Patient Name: Date of Service: [name] MES E. 04/20/2022 10:00 A M Medical Record Number: [id number] Patient Account Number: [id number] Date of Birth/Sex: Treating RN: [birth year] (56 y.o. [name] Primary Care Hanan Moen: [name] Other Clinician: [name] Referring Kaelin Holford: Treating Kimbery Harwood/Extender: [name] in Treatment: 2 HBO Treatment Course Details Treatment Course Number: 1 Ordering Jocelin Schuelke: [name] T Treatments Ordered: otal 40 HBO Treatment Start Date: 04/09/2022 HBO Indication: Late Effect of Radiation HBO Treatment Details Treatment Number: 8 Patient Type: Outpatient Chamber Type: Monoplace Chamber Serial #: [id number] Treatment Protocol: 2.0 ATA with 90 minutes oxygen, and no air breaks Treatment Details Compression Rate Down: 2.0 psi / minute De-Compression Rate Up: 2.0 psi / minute Air breaks and breathing Decompress Decompress Compress Tx Pressure Begins Reached periods Begins Ends (leave unused spaces blank) Chamber Pressure (ATA 1 2 ------2 1 ) Clock Time (24 hr) 09:56 10:05 - - - - - - 11:35 11:42 Treatment Length: 106 (minutes) Treatment Segments: 4 Vital Signs Capillary Blood Glucose Reference Range: 80 - 120 mg / dl HBO Diabetic Blood Glucose Intervention Range: <131 mg/dl or >249 mg/dl Time Vitals Blood Respiratory Capillary Blood Glucose Pulse Action Type: Pulse: Temperature: Taken: Pressure: Rate: Glucose (mg/dl): Meter #: Oximetry (%) Taken: Pre 09:47 133/65 96 18 97.2 198 Post 11:48 159/70 83 18 97.5 212 Treatment Response Treatment Toleration: Well Treatment Completion Status: Treatment Completed without Adverse Event Physician HBO Attestation: I certify that I supervised this HBO treatment in accordance with Medicare guidelines. A trained emergency response  team is readily available per Yes hospital policies and procedures. Continue HBOT as ordered. Yes Electronic Signature(s) Signed: 04/20/2022 4:04:52 PM By: [name] MD FACS Previous Signature: 04/20/2022 2:40:09 PM Version By: [name] EMT Entered By: [name] on 04/20/2022 16:04:52 [name] ([id number]) [id number] Page 2 of 2 -------------------------------------------------------------------------------- HBO Safety Checklist Details Patient Name: Date of Service: [name] MES E. 04/20/2022 10:00 A M Medical Record Number: [id number] Patient Account Number: [id number] Date of Birth/Sex: Treating RN: [birth year] (56 y.o. [name] Primary Care Avarie Tavano: [name] Other Clinician: [name] Referring Jabarie Pop: Treating Leandrea Ackley/Extender: [name] in Treatment: 2 [name] Safety Checklist Consent Form Signed Patient voided / foley secured and emptied When did you last eato 0800 Last dose of injectable or oral agent 0830 Ostomy pouch emptied and vented if applicable NA All implantable devices assessed, documented and approved NA Intravenous access site secured and place NA Valuables secured Linens and cotton and cotton/polyester blend (less than 51% polyester) Personal oil-based products / skin lotions / body lotions removed Wigs or hairpieces removed NA Smoking or tobacco materials removed Books / newspapers / magazines / loose paper removed Cologne, aftershave, perfume and deodorant removed Jewelry removed (may wrap wedding band) Make-up removed NA Hair care products removed Battery operated devices (external) removed Heating patches and chemical warmers removed Titanium eyewear removed NA removed Nail polish cured greater than 10 hours NA Casting material cured greater than 10 hours NA Hearing aids removed NA Loose dentures or partials  removed NA Prosthetics have been removed NA Patient demonstrates correct use of air break device (if applicable) Patient concerns have been addressed Patient grounding bracelet on and cord attached to chamber Specifics for Inpatients (complete in addition to above) Medication sheet sent with patient NA Intravenous medications needed or due during  therapy sent with patient NA Drainage tubes (e.g. nasogastric tube or chest tube secured and vented) NA Endotracheal or Tracheotomy tube secured NA Cuff deflated of air and inflated with saline NA Airway suctioned NA Notes The safety checklist was done before the treatment was started. Electronic Signature(s) Signed: 04/20/2022 2:37:06 PM By: [name] EMT Entered By: [name] on 04/20/2022 14:37:06

## 2022-04-20 NOTE — Progress Notes (Signed)
JOELLE, FLESSNER ([id number]) [id number] Page 1 of 2 Visit Report for 04/20/2022 Problem List Details Patient Name: Date of Service: Dillon Dun MES E. 04/20/2022 10:00 A M Medical Record Number: [id number] Patient Account Number: [id number] Date of Birth/Sex: Treating RN: 04-07-1966 (56 y.o. 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 04/20/2022 Medical Record Number: [id number] Patient Account Number: [id number] Date of Birth/Sex: Treating RN: 03-Dec-1965 (56 y.o. [name] Primary Care Provider: [name] Other Clinician: [name] Referring Provider: Treating Provider/Extender: [name] in Treatment: 2 Diagnosis Coding ICD-10 Codes Code Description N30.41 Irradiation cystitis with hematuria JOAOVICTOR, KRONE ([id number]) [id number] Page 2 of 2 R31.9 Hematuria, unspecified C61 Malignant neoplasm of prostate E10.3529 Type 1 diabetes mellitus with proliferative diabetic  retinopathy with traction retinal detachment involving the macula, unspecified eye Facility Procedures : CPT4 Code: [id number] Description: G0277-(Facility Use Only) HBOT full body chamber, 33mn , ICD-10 Diagnosis Description 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 Hematuria, unspecified C61 Malignant neoplasm of prostate Modifier: Quantity: 4 Physician Procedures : CPT4 Code Description Modifier [id number]- WC PHYS HYPERBARIC OXYGEN THERAPY ICD-10 Diagnosis Description 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 Hematuria, unspecified C61 Malignant neoplasm of prostate Quantity: 1 Electronic Signature(s) Signed: 04/20/2022 2:40:26 PM By: GValeria BatmanEMT Signed: 04/20/2022 4:03:30 PM By: CFredirick MaudlinMD FACS Entered By: GValeria Batmanon 04/20/2022 14:40:26

## 2022-04-21 ENCOUNTER — Encounter (HOSPITAL_BASED_OUTPATIENT_CLINIC_OR_DEPARTMENT_OTHER): Payer: BC Managed Care – PPO | Admitting: General Surgery

## 2022-04-21 DIAGNOSIS — N3041 Irradiation cystitis with hematuria: Secondary | ICD-10-CM | POA: Diagnosis not present

## 2022-04-21 LAB — GLUCOSE, CAPILLARY
Glucose-Capillary: 184 mg/dL — ABNORMAL HIGH (ref 70–99)
Glucose-Capillary: 164 mg/dL — ABNORMAL HIGH (ref 70–99)

## 2022-04-21 NOTE — Progress Notes (Signed)
KERBY, BORNER ([id number]) [id number] Page 1 of 2 Visit Report for 04/21/2022 HBO Details Patient Name: Date of Service: [name] MES E. 04/21/2022 10:00 A M Medical Record Number: [id number] Patient Account Number: [id number] Date of Birth/Sex: Treating RN: November 17, 1965 (56 y.o. [name] Primary Care Adriahna Shearman: [name] Other Clinician: [name] Referring Barnet Benavides: Treating Demonta Wombles/Extender: [name] in Treatment: 3 HBO Treatment Course Details Treatment Course Number: 1 Ordering Shenicka Sunderlin: [name] T Treatments Ordered: otal 40 HBO Treatment Start Date: 04/09/2022 HBO Indication: Late Effect of Radiation HBO Treatment Details Treatment Number: 9 Patient Type: Outpatient Chamber Type: Monoplace Chamber Serial #: [id number] Treatment Protocol: 2.0 ATA with 90 minutes oxygen, and no air breaks Treatment Details Compression Rate Down: 2.0 psi / minute De-Compression Rate Up: 2.0 psi / minute Air breaks and breathing Decompress Decompress Compress Tx Pressure Begins Reached periods Begins Ends (leave unused spaces blank) Chamber Pressure (ATA 1 2 ------2 1 ) Clock Time (24 hr) 09:51 09:59 - - - - - - 11:29 11:38 Treatment Length: 107 (minutes) Treatment Segments: 4 Vital Signs Capillary Blood Glucose Reference Range: 80 - 120 mg / dl HBO Diabetic Blood Glucose Intervention Range: <131 mg/dl or >249 mg/dl Time Vitals Blood Respiratory Capillary Blood Glucose Pulse Action Type: Pulse: Temperature: Taken: Pressure: Rate: Glucose (mg/dl): Meter #: Oximetry (%) Taken: Pre 09:48 167/68 93 16 97.2 184 Post 11:45 150/75 82 18 97.2 164 Treatment Response Treatment Toleration: Well Treatment Completion Status: Treatment Completed without Adverse Event Physician HBO Attestation: I certify that I supervised this HBO treatment in accordance with Medicare guidelines. A trained emergency response team  is readily available per Yes hospital policies and procedures. Continue HBOT as ordered. Yes Electronic Signature(s) Signed: 04/21/2022 2:25:58 PM By: [name] MD FACS Previous Signature: 04/21/2022 2:25:31 PM Version By: [name] MD FACS Previous Signature: 04/21/2022 2:06:52 PM Version By: [name] EMT Entered By: [name] on 04/21/2022 14:25:58 [name] ([id number]) [id number] Page 2 of 2 -------------------------------------------------------------------------------- HBO Safety Checklist Details Patient Name: Date of Service: [name] MES E. 04/21/2022 10:00 A M Medical Record Number: [id number] Patient Account Number: [id number] Date of Birth/Sex: Treating RN: 20-Nov-1965 (56 y.o. [name] Primary Care Miquan Tandon: [name] Other Clinician: [name] Referring Romonia Yanik: Treating Vedanth Sirico/Extender: [name] in Treatment: 3 [name] Safety Checklist Consent Form Signed Patient voided / foley secured and emptied When did you last eato 0800 Last dose of injectable or oral agent 0830 Ostomy pouch emptied and vented if applicable NA All implantable devices assessed, documented and approved Libre2 Intravenous access site secured and place NA Valuables secured Linens and cotton and cotton/polyester blend (less than 51% polyester) Personal oil-based products / skin lotions / body lotions removed Wigs or hairpieces removed NA Smoking or tobacco materials removed Books / newspapers / magazines / loose paper removed Cologne, aftershave, perfume and deodorant removed Jewelry removed (may wrap wedding band) Make-up removed NA Hair care products removed Battery operated devices (external) removed Heating patches and chemical warmers removed Titanium eyewear removed NA Nail polish cured greater than 10 hours NA Casting material cured greater than 10  hours NA Hearing aids removed NA Loose dentures or partials removed NA Prosthetics have been removed NA Patient demonstrates correct use of air break device (if applicable) Patient concerns have been addressed Patient grounding bracelet on and cord attached to chamber Specifics for Inpatients (complete in addition to above) Medication sheet  sent with patient NA Intravenous medications needed or due during therapy sent with patient NA Drainage tubes (e.g. nasogastric tube or chest tube secured and vented) NA Endotracheal or Tracheotomy tube secured NA Cuff deflated of air and inflated with saline NA Airway suctioned NA Notes The safety checklist was done before the treatment was started. Electronic Signature(s) Signed: 04/21/2022 2:05:03 PM By: [name] EMT Entered By: [name] on 04/21/2022 14:05:03

## 2022-04-21 NOTE — Progress Notes (Signed)
AURTHUR, WINGERTER ([id number]) [id number] Page 1 of 2 Visit Report for 04/21/2022 Problem List Details Patient Name: Date of Service: Dillon Dun MES E. 04/21/2022 10:00 A M Medical Record Number: [id number] Patient Account Number: [id number] Date of Birth/Sex: Treating RN: 1965-07-30 (56 y.o. 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 04/21/2022 Medical Record Number: [id number] Patient Account Number: [id number] Date of Birth/Sex: Treating RN: 16-Dec-1965 (56 y.o. [name] Primary Care Provider: [name] Other Clinician: [name] Referring Provider: Treating Provider/Extender: [name] in Treatment: 3 Diagnosis Coding ICD-10 Codes Code Description N30.41 Irradiation cystitis with hematuria [name], [name] ([id number]) [id number] Page 2 of 2 R31.9 Hematuria, unspecified C61 Malignant neoplasm of prostate E10.3529 Type 1 diabetes mellitus with proliferative diabetic retinopathy  with traction retinal detachment involving the macula, unspecified eye Facility Procedures : CPT4 Code: 77824235 Description: G0277-(Facility Use Only) HBOT full body chamber, 46mn , ICD-10 Diagnosis Description 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 Hematuria, unspecified C61 Malignant neoplasm of prostate Modifier: Quantity: 4 Physician Procedures : CPT4 Code Description Modifier [id number] PHYS HYPERBARIC OXYGEN THERAPY ICD-10 Diagnosis Description 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 Hematuria, unspecified C61 Malignant neoplasm of prostate Quantity: 1 Electronic Signature(s) Signed: 04/21/2022 2:07:11 PM By: GValeria BatmanEMT Signed: 04/21/2022 2:25:07 PM By: CFredirick MaudlinMD FACS Entered By: GValeria Batmanon 04/21/2022 14:07:11

## 2022-04-21 NOTE — Progress Notes (Signed)
LEX, LINHARES ([id number]) [id number] Page 1 of 2 Visit Report for 04/21/2022 Arrival Information Details Patient Name: Date of Service: Dillon Dun MES E. 04/21/2022 10:00 A M Medical Record Number: [id number] Patient Account Number: [id number] Date of Birth/Sex: Treating RN: 12-Feb-1966 (56 y.o. [name] Primary Care Humphrey Guerreiro: [name] Other Clinician: [name] Referring Tunis Gentle: Treating Mariusz Jubb/Extender: [name] in Treatment: 3 Visit Information History Since Last Visit All ordered tests and consults were completed: Yes Patient Arrived: Ambulatory Added or deleted any medications: No Arrival Time: 09:28 Any new allergies or adverse reactions: No Accompanied By: None Had a fall or experienced change in No Transfer Assistance: None activities of daily living that may affect Patient Identification Verified: Yes risk of falls: Secondary Verification Process Completed: Yes Signs or symptoms of abuse/neglect since last visito No Patient Requires Transmission-Based Precautions: No Hospitalized since last visit: No Patient Has Alerts: No Implantable device outside of the clinic excluding No cellular tissue based products placed in the center since last visit: Pain Present Now: No Electronic Signature(s) Signed: 04/21/2022 2:00:34 PM By: [name] EMT Entered By: [name] on 04/21/2022 14:00:34 -------------------------------------------------------------------------------- Encounter Discharge Information Details Patient Name: Date of Service: Dillon Dun MES E. 04/21/2022 10:00 A M Medical Record Number: [id number] Patient Account Number: [id number] Date of Birth/Sex: Treating RN: [birth year] (56 y.o. [name]: [name] Other Clinician: [name] Referring Jasnoor Trussell: Treating Texie Tupou/Extender: [name] in Treatment:  3 Encounter Discharge Information Items Discharge Condition: Stable Ambulatory Status: Ambulatory Discharge Destination: Home Transportation: Private Auto Accompanied By: None Schedule Follow-up Appointment: Yes Clinical Summary of Care: Electronic Signature(s) Signed: 04/21/2022 2:07:46 PM By: [name] EMT Entered By: [name] on 04/21/2022 14:07:46 [name] ([id number]) [id number] Page 2 of 2 -------------------------------------------------------------------------------- Vitals Details Patient Name: Date of Service: Dillon Dun MES E. 04/21/2022 10:00 A M Medical Record Number: [id number] Patient Account Number: [id number] Date of Birth/Sex: Treating RN: Dec 03, 1965 (56 y.o. [name] Primary Care Teresa Lemmerman: [name] Other Clinician: [name] Referring Keona Sheffler: Treating Maxine Fredman/Extender: [name] in Treatment: 3 Vital Signs Time Taken: 09:48 Temperature (F): 97.2 Height (in): 70 Pulse (bpm): 93 Weight (lbs): 210 Respiratory Rate (breaths/min): 16 Body Mass Index (BMI): 30.1 Blood Pressure (mmHg): 167/68 Capillary Blood Glucose (mg/dl): 184 Reference Range: 80 - 120 mg / dl Electronic Signature(s) Signed: 04/21/2022 2:02:09 PM By: [name] EMT Entered By: [name] on 04/21/2022 14:02:09

## 2022-04-22 ENCOUNTER — Encounter (HOSPITAL_BASED_OUTPATIENT_CLINIC_OR_DEPARTMENT_OTHER): Payer: BC Managed Care – PPO | Admitting: General Surgery

## 2022-04-22 DIAGNOSIS — N3041 Irradiation cystitis with hematuria: Secondary | ICD-10-CM | POA: Diagnosis not present

## 2022-04-22 LAB — GLUCOSE, CAPILLARY
Glucose-Capillary: 162 mg/dL — ABNORMAL HIGH (ref 70–99)
Glucose-Capillary: 145 mg/dL — ABNORMAL HIGH (ref 70–99)

## 2022-04-22 NOTE — Progress Notes (Addendum)
[name], [name] ([id number]) [id number] Page 1 of 2 Visit Report for 04/22/2022 HBO Details Patient Name: Date of Service: Dillon Dun MES [name]. 04/22/2022 10:00 A M Medical Record Number: [id number] Patient Account Number: [id number] Date of Birth/Sex: Treating RN: 1965-12-31 (56 y.o. [name], [place] Primary Care Shloma Roggenkamp: [name] Other Clinician: [name] Referring Soma Lizak: Treating Rosamary Boudreau/Extender: [name] in Treatment: 3 HBO Treatment Course Details Treatment Course Number: 1 Ordering Cottrell [name]: [name] T Treatments Ordered: otal 40 HBO Treatment Start Date: 04/09/2022 HBO Indication: Late Effect of Radiation HBO Treatment Details Treatment Number: 10 Patient Type: Outpatient Chamber Type: Monoplace Chamber Serial #: [id number] Treatment Protocol: 2.0 ATA with 90 minutes oxygen, and no air breaks Treatment Details Compression Rate Down: 2.0 psi / minute De-Compression Rate Up: 2.0 psi / minute Air breaks and breathing Decompress Decompress Compress Tx Pressure Begins Reached periods Begins Ends (leave unused spaces blank) Chamber Pressure (ATA 1 2 ------2 1 ) Clock Time (24 hr) 08:38 08:47 - - - - - - 10:17 10:25 Treatment Length: 107 (minutes) Treatment Segments: 4 Vital Signs Capillary Blood Glucose Reference Range: 80 - 120 mg / dl HBO Diabetic Blood Glucose Intervention Range: <131 mg/dl or >249 mg/dl Time Vitals Blood Respiratory Capillary Blood Glucose Pulse Action Type: Pulse: Temperature: Taken: Pressure: Rate: Glucose (mg/dl): Meter #: Oximetry (%) Taken: Pre 08:34 156/67 93 18 97.3 162 Post 10:29 165/78 82 16 97.2 145 Treatment Response Treatment Toleration: Well Treatment Completion Status: Treatment Completed without Adverse Event Physician HBO Attestation: I certify that I supervised this HBO treatment in accordance with Medicare guidelines. A trained emergency response  team is readily available per Yes hospital policies and procedures. Continue HBOT as ordered. Yes Electronic Signature(s) Signed: 04/22/2022 4:01:14 PM By: [name] MD FACS Previous Signature: 04/22/2022 1:27:36 PM Version By: [name] EMT Entered By: [name] on 04/22/2022 [place], [name] ([id number]) [id number] Page 2 of 2 -------------------------------------------------------------------------------- HBO Safety Checklist Details Patient Name: Date of Service: Dillon Dun MES [name]. 04/22/2022 10:00 A M Medical Record Number: [id number] Patient Account Number: [id number] Date of Birth/Sex: Treating RN: 10-12-1965 (56 y.o. [name], [place] Primary Care Nickola Lenig: [name] Other Clinician: [name] Referring Altheria Shadoan: Treating Dondi Burandt/Extender: [name] in Treatment: 3 [name] Safety Checklist Consent Form Signed Patient voided / foley secured and emptied When did you last eato 0700 Last dose of injectable or oral agent 0730 Ostomy pouch emptied and vented if applicable NA All implantable devices assessed, documented and approved NA Intravenous access site secured and place NA Valuables secured Linens and cotton and cotton/polyester blend (less than 51% polyester) Personal oil-based products / skin lotions / body lotions removed Wigs or hairpieces removed NA Smoking or tobacco materials removed Books / newspapers / magazines / loose paper removed Cologne, aftershave, perfume and deodorant removed Jewelry removed (may wrap wedding band) Make-up removed NA Hair care products removed Battery operated devices (external) removed Heating patches and chemical warmers removed Titanium eyewear removed NA Nail polish cured greater than 10 hours NA Casting material cured greater than 10 hours NA Hearing aids removed NA Loose dentures or partials  removed NA Prosthetics have been removed NA Patient demonstrates correct use of air break device (if applicable) Patient concerns have been addressed Patient grounding bracelet on and cord attached to chamber Specifics for Inpatients (complete in addition to above) Medication sheet sent with patient NA Intravenous medications needed or due during therapy  sent with patient NA Drainage tubes (Mooreg. nasogastric tube or chest tube secured and vented) NA Endotracheal or Tracheotomy tube secured NA Cuff deflated of air and inflated with saline NA Airway suctioned NA Notes The safety checklist was done before the treatment was started. Electronic Signature(s) Signed: 04/22/2022 1:25:12 PM By: [name] EMT Entered By: [name] on 04/22/2022 13:25:12

## 2022-04-22 NOTE — Progress Notes (Signed)
SHONTE, SODERLUND ([id number]) [id number] Page 1 of 2 Visit Report for 04/22/2022 Problem List Details Patient Name: Date of Service: Dillon Dun MES E. 04/22/2022 10:00 A M Medical Record Number: [id number] Patient Account Number: [id number] Date of Birth/Sex: Treating RN: 09-Nov-1965 (56 y.o. 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 04/22/2022 Medical Record Number: [id number] Patient Account Number: [id number] Date of Birth/Sex: Treating RN: 1965-12-22 (56 y.o. [name], [name] Primary Care Provider: [name] Other Clinician: [name] Referring Provider: Treating Provider/Extender: [name] in Treatment: 3 Diagnosis Coding ICD-10 Codes Code Description N30.41 Irradiation cystitis with hematuria [name], [name] ([id number]) [id number] Page 2 of 2 R31.9 Hematuria, unspecified C61 Malignant neoplasm of prostate E10.3529 Type 1 diabetes mellitus with proliferative diabetic  retinopathy with traction retinal detachment involving the macula, unspecified eye Facility Procedures : CPT4 Code: [id number] Description: G0277-(Facility Use Only) HBOT full body chamber, 108mn , ICD-10 Diagnosis Description 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 Hematuria, unspecified C61 Malignant neoplasm of prostate Modifier: Quantity: 4 Physician Procedures : CPT4 Code Description Modifier [id number]- WC PHYS HYPERBARIC OXYGEN THERAPY ICD-10 Diagnosis Description 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 Hematuria, unspecified C61 Malignant neoplasm of prostate Quantity: 1 Electronic Signature(s) Signed: 04/22/2022 1:27:52 PM By: GValeria BatmanEMT Signed: 04/22/2022 3:57:12 PM By: CFredirick MaudlinMD FACS Entered By: GValeria Batmanon 04/22/2022 13:27:51

## 2022-04-22 NOTE — Progress Notes (Signed)
BILLEY, WOJCIAK ([id number]) [id number] Page 1 of 2 Visit Report for 04/22/2022 Arrival Information Details Patient Name: Date of Service: [name] MES E. 04/22/2022 10:00 A M Medical Record Number: [id number] Patient Account Number: [id number] Date of Birth/Sex: Treating RN: [birth year] (56 y.o. [name], [name] Primary Care Anaisa Radi: [name] Other Clinician: [name] Referring Knolan Simien: Treating Zykira Matlack/Extender: [name] in Treatment: 3 Visit Information History Since Last Visit All ordered tests and consults were completed: Yes Patient Arrived: Ambulatory Added or deleted any medications: No Arrival Time: 07:58 Any new allergies or adverse reactions: No Accompanied By: None Had a fall or experienced change in No Transfer Assistance: None activities of daily living that may affect Patient Identification Verified: Yes risk of falls: Secondary Verification Process Completed: Yes Signs or symptoms of abuse/neglect since last visito No Patient Requires Transmission-Based Precautions: No Hospitalized since last visit: No Patient Has Alerts: No Implantable device outside of the clinic excluding No cellular tissue based products placed in the center since last visit: Pain Present Now: No Electronic Signature(s) Signed: 04/22/2022 1:23:39 PM By: [name] EMT Entered By: [name] on 04/22/2022 13:23:39 -------------------------------------------------------------------------------- Encounter Discharge Information Details Patient Name: Date of Service: [name] MES E. 04/22/2022 10:00 A M Medical Record Number: [id number] Patient Account Number: [id number] Date of Birth/Sex: Treating RN: 1966/04/21 (56 y.o. [name], [name] Primary Care Rhoda Waldvogel: [name] Other Clinician: [name] Referring Janyia Guion: Treating Lynnwood Beckford/Extender: [name] in Treatment:  3 Encounter Discharge Information Items Discharge Condition: Stable Ambulatory Status: Ambulatory Discharge Destination: Home Transportation: Private Auto Accompanied By: None Schedule Follow-up Appointment: Yes Clinical Summary of Care: Electronic Signature(s) Signed: 04/22/2022 1:31:23 PM By: [name] EMT Entered By: [name] on 04/22/2022 13:31:22 [name] ([id number]) [id number] Page 2 of 2 -------------------------------------------------------------------------------- Vitals Details Patient Name: Date of Service: [name] MES E. 04/22/2022 10:00 A M Medical Record Number: [id number] Patient Account Number: [id number] Date of Birth/Sex: Treating RN: 05/23/1966 (56 y.o. [name], [name] Primary Care Tvisha Schwoerer: [name] Other Clinician: [name] Referring Shahram Alexopoulos: Treating Marylouise Mallet/Extender: [name] in Treatment: 3 Vital Signs Time Taken: 08:34 Temperature (F): 97.3 Height (in): 70 Pulse (bpm): 93 Weight (lbs): 210 Respiratory Rate (breaths/min): 18 Body Mass Index (BMI): 30.1 Blood Pressure (mmHg): 156/67 Capillary Blood Glucose (mg/dl): 162 Reference Range: 80 - 120 mg / dl Electronic Signature(s) Signed: 04/22/2022 1:24:06 PM By: [name] EMT Entered By: [name] on 04/22/2022 13:24:06

## 2022-04-23 ENCOUNTER — Encounter (HOSPITAL_BASED_OUTPATIENT_CLINIC_OR_DEPARTMENT_OTHER): Payer: BC Managed Care – PPO | Admitting: Internal Medicine

## 2022-04-23 DIAGNOSIS — C61 Malignant neoplasm of prostate: Secondary | ICD-10-CM | POA: Diagnosis not present

## 2022-04-23 DIAGNOSIS — N3041 Irradiation cystitis with hematuria: Secondary | ICD-10-CM

## 2022-04-23 DIAGNOSIS — R319 Hematuria, unspecified: Secondary | ICD-10-CM | POA: Diagnosis not present

## 2022-04-23 LAB — GLUCOSE, CAPILLARY
Glucose-Capillary: 167 mg/dL — ABNORMAL HIGH (ref 70–99)
Glucose-Capillary: 112 mg/dL — ABNORMAL HIGH (ref 70–99)

## 2022-04-23 NOTE — Progress Notes (Signed)
[name] ([id number]) [id number] Page 1 of 2 Visit Report for 04/23/2022 Arrival Information Details Patient Name: Date of Service: Dillon Dun MES E. 04/23/2022 10:00 A M Medical Record Number: [id number] Patient Account Number: [id number] Date of Birth/Sex: Treating RN: Dec 27, 1965 (56 y.o. [name] Primary Care [name]: [name] Other Clinician: [name] Referring [name]: Treating [name]/Extender: [name] in Treatment: 3 Visit Information History Since Last Visit All ordered tests and consults were completed: Yes Patient Arrived: Ambulatory Added or deleted any medications: No Arrival Time: 09:29 Any new allergies or adverse reactions: No Accompanied By: None Had a fall or experienced change in No Transfer Assistance: None activities of daily living that may affect Patient Identification Verified: Yes risk of falls: Secondary Verification Process Completed: Yes Signs or symptoms of abuse/neglect since last visito No Patient Requires Transmission-Based Precautions: No Hospitalized since last visit: No Patient Has Alerts: No Implantable device outside of the clinic excluding No cellular tissue based products placed in the center since last visit: Pain Present Now: No Electronic Signature(s) Signed: 04/23/2022 2:36:13 PM By: [name] EMT Entered By: [name] on 04/23/2022 14:36:13 -------------------------------------------------------------------------------- Encounter Discharge Information Details Patient Name: Date of Service: Dillon Dun MES E. 04/23/2022 10:00 A M Medical Record Number: [id number] Patient Account Number: [id number] Date of Birth/Sex: Treating RN: 04-14-1966 (56 y.o. [name] Primary Care [name]: [name] Other Clinician: [name] Referring [name]: Treating [name]: [name] in Treatment:  3 Encounter Discharge Information Items Discharge Condition: Stable Ambulatory Status: Ambulatory Discharge Destination: Home Transportation: Private Auto Accompanied By: None Schedule Follow-up Appointment: Yes Clinical Summary of Care: Electronic Signature(s) Signed: 04/23/2022 2:41:36 PM By: [name] EMT Entered By: [name] on 04/23/2022 14:41:36 [name] ([id number]) [id number] Page 2 of 2 -------------------------------------------------------------------------------- Vitals Details Patient Name: Date of Service: Dillon Dun MES E. 04/23/2022 10:00 A M Medical Record Number: [id number] Patient Account Number: [id number] Date of Birth/Sex: Treating RN: 21-May-1966 (56 y.o. [name] Primary Care [name]: [name] Other Clinician: [name] Referring [name]: Treating [name]: [name] in Treatment: 3 Vital Signs Time Taken: 09:44 Temperature (F): 97.0 Height (in): 70 Pulse (bpm): 90 Weight (lbs): 210 Respiratory Rate (breaths/min): 18 Body Mass Index (BMI): 30.1 Blood Pressure (mmHg): 150/67 Capillary Blood Glucose (mg/dl): 167 Reference Range: 80 - 120 mg / dl Electronic Signature(s) Signed: 04/23/2022 2:37:21 PM By: [name] EMT Entered By: [name] on 04/23/2022 14:37:21

## 2022-04-23 NOTE — Progress Notes (Addendum)
MORLEY, GAUMER ([id number]) [id number] Page 1 of 2 Visit Report for 04/23/2022 HBO Details Patient Name: Date of Service: [name] MES E. 04/23/2022 10:00 A M Medical Record Number: [id number] Patient Account Number: [id number] Date of Birth/Sex: Treating RN: [birth year] (56 y.o. [name] Primary Care Izamar Linden: [name] Other Clinician: [name] Referring Hunter Bachar: Treating Sherrilynn Gudgel/Extender: [name] in Treatment: 3 HBO Treatment Course Details Treatment Course Number: 1 Ordering Hart Haas: [name] T Treatments Ordered: otal 40 HBO Treatment Start Date: 04/09/2022 HBO Indication: Late Effect of Radiation HBO Treatment Details Treatment Number: 11 Patient Type: Outpatient Chamber Type: Monoplace Chamber Serial #: [id number] Treatment Protocol: 2.0 ATA with 90 minutes oxygen, and no air breaks Treatment Details Compression Rate Down: 2.0 psi / minute De-Compression Rate Up: 2.0 psi / minute Air breaks and breathing Decompress Decompress Compress Tx Pressure Begins Reached periods Begins Ends (leave unused spaces blank) Chamber Pressure (ATA 1 2 ------2 1 ) Clock Time (24 hr) 09:56 10:04 - - - - - - 11:35 11:43 Treatment Length: 107 (minutes) Treatment Segments: 4 Vital Signs Capillary Blood Glucose Reference Range: 80 - 120 mg / dl HBO Diabetic Blood Glucose Intervention Range: <131 mg/dl or >249 mg/dl Time Vitals Blood Respiratory Capillary Blood Glucose Pulse Action Type: Pulse: Temperature: Taken: Pressure: Rate: Glucose (mg/dl): Meter #: Oximetry (%) Taken: Pre 09:44 150/67 90 18 97 167 Post 11:49 163/79 77 18 97.1 112 Treatment Response Treatment Toleration: Well Treatment Completion Status: Treatment Completed without Adverse Event Physician HBO Attestation: I certify that I supervised this HBO treatment in accordance with Medicare guidelines. A trained emergency response  team is readily available per Yes hospital policies and procedures. Continue HBOT as ordered. Yes Electronic Signature(s) Signed: 04/23/2022 4:46:25 PM By: [name] DO Previous Signature: 04/23/2022 2:40:34 PM Version By: [name] EMT Previous Signature: 04/23/2022 4:44:16 PM Version By: [name] DO Entered By: [name] on 04/23/2022 16:44:48 [name] ([id number]) [id number] Page 2 of 2 -------------------------------------------------------------------------------- HBO Safety Checklist Details Patient Name: Date of Service: [name] MES E. 04/23/2022 10:00 A M Medical Record Number: [id number] Patient Account Number: [id number] Date of Birth/Sex: Treating RN: Jan 21, 1966 (56 y.o. [name] Primary Care Marino Rogerson: [name] Other Clinician: [name] Referring Bowen Kia: Treating Davieon Stockham/Extender: [name] in Treatment: 3 [name] Safety Checklist Consent Form Signed Patient voided / foley secured and emptied When did you last eato 0800 Last dose of injectable or oral agent 0830 Ostomy pouch emptied and vented if applicable NA All implantable devices assessed, documented and approved NA Intravenous access site secured and place NA Valuables secured Linens and cotton and cotton/polyester blend (less than 51% polyester) Personal oil-based products / skin lotions / body lotions removed Wigs or hairpieces removed NA Smoking or tobacco materials removed Books / newspapers / magazines / loose paper removed Cologne, aftershave, perfume and deodorant removed Jewelry removed (may wrap wedding band) Make-up removed NA Hair care products removed Battery operated devices (external) removed Heating patches and chemical warmers removed Titanium eyewear removed NA Nail polish cured greater than 10 hours NA Casting material cured greater than 10 hours NA Hearing  aids removed NA Loose dentures or partials removed NA Prosthetics have been removed NA Patient demonstrates correct use of air break device (if applicable) Patient concerns have been addressed Patient grounding bracelet on and cord attached to chamber Specifics for Inpatients (complete in addition to above) Medication sheet sent with  patient NA Intravenous medications needed or due during therapy sent with patient NA Drainage tubes (Mooreg. nasogastric tube or chest tube secured and vented) NA Endotracheal or Tracheotomy tube secured NA Cuff deflated of air and inflated with saline NA Airway suctioned NA Notes The safety checklist was done before the treatment was started. Electronic Signature(s) Signed: 04/23/2022 2:38:23 PM By: [name] EMT Entered By: [name] on 04/23/2022 14:38:23

## 2022-04-23 NOTE — Progress Notes (Signed)
KRISTIN, LAMAGNA ([id number]) [id number] Page 1 of 2 Visit Report for 04/23/2022 Problem List Details Patient Name: Date of Service: Dillon Dun MES E. 04/23/2022 10:00 A M Medical Record Number: [id number] Patient Account Number: [id number] Date of Birth/Sex: Treating RN: 03/20/1966 (56 y.o. 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 04/23/2022 Medical Record Number: [id number] Patient Account Number: [id number] Date of Birth/Sex: Treating RN: [birth year] (56 y.o. [name] Primary Care Provider: [name] Other Clinician: [name] Referring Provider: Treating Provider/Extender: [name] in Treatment: 3 Diagnosis Coding ICD-10 Codes Code Description N30.41 Irradiation cystitis with hematuria [name], [name] ([id number]) [id number] Page 2 of 2 R31.9 Hematuria, unspecified C61 Malignant neoplasm of prostate E10.3529 Type 1 diabetes mellitus with proliferative diabetic retinopathy  with traction retinal detachment involving the macula, unspecified eye Facility Procedures : CPT4 Code: [id number] Description: G0277-(Facility Use Only) HBOT full body chamber, 27mn , ICD-10 Diagnosis Description 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 Hematuria, unspecified C61 Malignant neoplasm of prostate Modifier: Quantity: 4 Physician Procedures : CPT4 Code Description Modifier [id number] 90300- WC PHYS HYPERBARIC OXYGEN THERAPY ICD-10 Diagnosis Description 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 Hematuria, unspecified C61 Malignant neoplasm of prostate Quantity: 1 Electronic Signature(s) Signed: 04/23/2022 2:40:53 PM By: GValeria BatmanEMT Signed: 04/23/2022 4:44:16 PM By: HKalman ShanDO Entered By: GValeria Batmanon 04/23/2022 14:40:53

## 2022-04-24 ENCOUNTER — Encounter (HOSPITAL_BASED_OUTPATIENT_CLINIC_OR_DEPARTMENT_OTHER): Payer: BC Managed Care – PPO | Admitting: Internal Medicine

## 2022-04-24 DIAGNOSIS — N3041 Irradiation cystitis with hematuria: Secondary | ICD-10-CM | POA: Diagnosis not present

## 2022-04-24 LAB — GLUCOSE, CAPILLARY
Glucose-Capillary: 232 mg/dL — ABNORMAL HIGH (ref 70–99)
Glucose-Capillary: 224 mg/dL — ABNORMAL HIGH (ref 70–99)

## 2022-04-24 NOTE — Progress Notes (Addendum)
KELDON, LASSEN ([id number]) [id number] Page 1 of 2 Visit Report for 04/24/2022 Arrival Information Details Patient Name: Date of Service: Dillon Dun MES E. 04/24/2022 10:00 A M Medical Record Number: [id number] Patient Account Number: [id number] Date of Birth/Sex: Treating RN: Feb 04, 1966 (56 y.o. [name] Primary Care Ambrose Wile: [name] Other Clinician: Donavan Burnet Referring Lashun Ramseyer: Treating Cleophus Mendonsa/Extender: [name] in Treatment: 3 Visit Information History Since Last Visit All ordered tests and consults were completed: Yes Patient Arrived: Ambulatory Added or deleted any medications: No Arrival Time: 09:30 Any new allergies or adverse reactions: No Accompanied By: self Had a fall or experienced change in No Transfer Assistance: None activities of daily living that may affect Patient Identification Verified: Yes risk of falls: Secondary Verification Process Completed: Yes Signs or symptoms of abuse/neglect since last visito No Patient Requires Transmission-Based Precautions: No Hospitalized since last visit: No Patient Has Alerts: No Implantable device outside of the clinic excluding No cellular tissue based products placed in the center since last visit: Pain Present Now: No Electronic Signature(s) Signed: 04/24/2022 11:31:30 AM By: Donavan Burnet CHT EMT BS , , Entered By: Donavan Burnet on 04/24/2022 11:31:29 -------------------------------------------------------------------------------- Encounter Discharge Information Details Patient Name: Date of Service: Dillon Dun MES E. 04/24/2022 10:00 A M Medical Record Number: [id number] Patient Account Number: [id number] Date of Birth/Sex: Treating RN: [birth year] (56 y.o. [name] Primary Care Foxx Klarich: [name] Other Clinician: Donavan Burnet Referring Kilea Mccarey: Treating Tnia Anglada/Extender: [name] in Treatment: 3 Encounter Discharge Information Items Discharge Condition: Stable Ambulatory Status: Ambulatory Discharge Destination: Home Transportation: Private Auto Accompanied By: self Schedule Follow-up Appointment: No Clinical Summary of Care: Electronic Signature(s) Signed: 04/24/2022 1:07:28 PM By: Donavan Burnet CHT EMT BS , , Entered By: Donavan Burnet on 04/24/2022 13:07:28 [name] ([id number]) [id number] Page 2 of 2 -------------------------------------------------------------------------------- Vitals Details Patient Name: Date of Service: Dillon Dun MES E. 04/24/2022 10:00 A M Medical Record Number: [id number] Patient Account Number: [id number] Date of Birth/Sex: Treating RN: Jan 27, 1966 (56 y.o. [name] Primary Care Jhovany Weidinger: [name] Other Clinician: Donavan Burnet Referring Wes Lezotte: Treating Kwesi Sangha/Extender: [name] in Treatment: 3 Vital Signs Time Taken: 09:43 Temperature (F): 97.3 Height (in): 70 Pulse (bpm): 97 Weight (lbs): 210 Respiratory Rate (breaths/min): 18 Body Mass Index (BMI): 30.1 Blood Pressure (mmHg): 163/80 Capillary Blood Glucose (mg/dl): 232 Reference Range: 80 - 120 mg / dl Electronic Signature(s) Signed: 04/24/2022 11:32:04 AM By: Donavan Burnet CHT EMT BS , , Entered By: Donavan Burnet on 04/24/2022 11:32:03

## 2022-04-24 NOTE — Progress Notes (Signed)
HANISH, LARAIA ([id number]) [id number] Page 1 of 1 Visit Report for 04/24/2022 SuperBill Details Patient Name: Date of Service: [name]. 04/24/2022 Medical Record Number: [id number] Patient Account Number: [id number] Date of Birth/Sex: Treating RN: January 24, 1966 (56 y.o. 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, unspecified eye Facility Procedures CPT4 Code Description Modifier Quantity [id number] G0277-(Facility Use Only) HBOT full body chamber, 97mn , 4 ICD-10 Diagnosis Description 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 Hematuria, unspecified C61 Malignant neoplasm of prostate 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, unspecified eye Physician Procedures Quantity CPT4 Code Description Modifier [id number] 37342- WC PHYS HYPERBARIC OXYGEN THERAPY 1 ICD-10 Diagnosis Description 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 Hematuria, unspecified C61 Malignant neoplasm of prostate 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

## 2022-04-24 NOTE — Progress Notes (Addendum)
LOVELACE, CERVENY ([id number]) [id number] Page 1 of 2 Visit Report for 04/24/2022 HBO Details Patient Name: Date of Service: [name]. 04/24/2022 10:00 A M Medical Record Number: [id number] Patient Account Number: [id number] Date of Birth/Sex: Treating RN: 04/13/1966 (56 y.o. [name] Primary Care Hadlie Gipson: [name] Other Clinician: Donavan Burnet Referring Bradyn Soward: Treating Kyrie Fludd/Extender: [name] in Treatment: 3 HBO Treatment Course Details Treatment Course Number: 1 Ordering Elmer Boutelle: [name] T Treatments Ordered: otal 40 HBO Treatment Start Date: 04/09/2022 HBO Indication: Late Effect of Radiation HBO Treatment Details Treatment Number: 12 Patient Type: Outpatient Chamber Type: Monoplace Chamber Serial #: [id number] Treatment Protocol: 2.0 ATA with 90 minutes oxygen, and no air breaks Treatment Details Compression Rate Down: 2.0 psi / minute De-Compression Rate Up: 2.0 psi / minute Air breaks and breathing Decompress Decompress Compress Tx Pressure Begins Reached periods Begins Ends (leave unused spaces blank) Chamber Pressure (ATA 1 2 ------2 1 ) Clock Time (24 hr) 09:53 10:04 - - - - - - 11:34 11:44 Treatment Length: 111 (minutes) Treatment Segments: 4 Vital Signs Capillary Blood Glucose Reference Range: 80 - 120 mg / dl HBO Diabetic Blood Glucose Intervention Range: <131 mg/dl or >249 mg/dl Type: Time Vitals Blood Respiratory Capillary Blood Glucose Pulse Action Pulse: Temperature: Taken: Pressure: Rate: Glucose (mg/dl): Meter #: Oximetry (%) Taken: Pre 09:43 163/80 97 18 97.3 232 1 none per protocol Post 11:47 163/78 81 18 97.3 224 1 none per protocol Treatment Response Treatment Toleration: Well Treatment Completion Status: Treatment Completed without Adverse Event Physician HBO Attestation: I certify that I supervised this HBO treatment in accordance with  Medicare guidelines. A trained emergency response team is readily available per Yes hospital policies and procedures. Continue HBOT as ordered. Yes Electronic Signature(s) Signed: 04/24/2022 4:34:39 PM By: [name] MD FACS Previous Signature: 04/24/2022 1:06:17 PM Version By: Donavan Burnet CHT EMT BS , , Entered By: [name] on 04/24/2022 16:34:38 [name] ([id number]) [id number] Page 2 of 2 -------------------------------------------------------------------------------- HBO Safety Checklist Details Patient Name: Date of Service: [name]. 04/24/2022 10:00 A M Medical Record Number: [id number] Patient Account Number: [id number] Date of Birth/Sex: Treating RN: 1965/10/09 (56 y.o. [name] Primary Care Stclair Szymborski: [name] Other Clinician: Donavan Burnet Referring Vedant Shehadeh: Treating Torie Priebe/Extender: [name] in Treatment: 3 [name] Safety Checklist Consent Form Signed Patient voided / foley secured and emptied When did you last eato 0800 Last dose of injectable or oral agent 0830 Ostomy pouch emptied and vented if applicable NA All implantable devices assessed, documented and approved NA Intravenous access site secured and place NA Valuables secured Linens and cotton and cotton/polyester blend (less than 51% polyester) Personal oil-based products / skin lotions / body lotions removed Wigs or hairpieces removed NA Smoking or tobacco materials removed NA Books / newspapers / magazines / loose paper removed Cologne, aftershave, perfume and deodorant removed Jewelry removed (may wrap wedding band) Make-up removed NA Hair care products removed Battery operated devices (external) removed Heating patches and chemical warmers removed Titanium eyewear removed Nail polish cured greater than 10 hours NA Casting material cured greater than 10  hours NA Hearing aids removed NA Loose dentures or partials removed NA Prosthetics have been removed NA Patient demonstrates correct use of air break device (if applicable) Patient concerns have been addressed Patient grounding bracelet on and cord attached to chamber Specifics for Inpatients (complete in addition to above) Medication  sheet sent with patient NA Intravenous medications needed or due during therapy sent with patient NA Drainage tubes ([name].g. nasogastric tube or chest tube secured and vented) NA Endotracheal or Tracheotomy tube secured NA Cuff deflated of air and inflated with saline NA Airway suctioned NA Notes Paper version used prior to treatment. Electronic Signature(s) Signed: 04/24/2022 11:33:31 AM By: Donavan Burnet CHT EMT BS , , Entered By: Donavan Burnet on 04/24/2022 11:33:31

## 2022-04-27 ENCOUNTER — Encounter (HOSPITAL_BASED_OUTPATIENT_CLINIC_OR_DEPARTMENT_OTHER): Payer: BC Managed Care – PPO | Admitting: Internal Medicine

## 2022-04-27 DIAGNOSIS — N3041 Irradiation cystitis with hematuria: Secondary | ICD-10-CM

## 2022-04-27 DIAGNOSIS — C61 Malignant neoplasm of prostate: Secondary | ICD-10-CM | POA: Diagnosis not present

## 2022-04-27 DIAGNOSIS — R319 Hematuria, unspecified: Secondary | ICD-10-CM | POA: Diagnosis not present

## 2022-04-27 DIAGNOSIS — E103529 Type 1 diabetes mellitus with proliferative diabetic retinopathy with traction retinal detachment involving the macula, unspecified eye: Secondary | ICD-10-CM

## 2022-04-27 LAB — GLUCOSE, CAPILLARY
Glucose-Capillary: 136 mg/dL — ABNORMAL HIGH (ref 70–99)
Glucose-Capillary: 97 mg/dL (ref 70–99)

## 2022-04-28 ENCOUNTER — Encounter (HOSPITAL_BASED_OUTPATIENT_CLINIC_OR_DEPARTMENT_OTHER): Payer: BC Managed Care – PPO | Admitting: Internal Medicine

## 2022-04-28 DIAGNOSIS — R319 Hematuria, unspecified: Secondary | ICD-10-CM

## 2022-04-28 DIAGNOSIS — N3041 Irradiation cystitis with hematuria: Secondary | ICD-10-CM | POA: Diagnosis not present

## 2022-04-28 DIAGNOSIS — C61 Malignant neoplasm of prostate: Secondary | ICD-10-CM

## 2022-04-28 LAB — GLUCOSE, CAPILLARY
Glucose-Capillary: 272 mg/dL — ABNORMAL HIGH (ref 70–99)
Glucose-Capillary: 201 mg/dL — ABNORMAL HIGH (ref 70–99)

## 2022-04-28 NOTE — Progress Notes (Signed)
ZAYDIN, BILLEY ([id number]) [id number] Page 1 of 2 Visit Report for 04/28/2022 Arrival Information Details Patient Name: Date of Service: Dillon Dun MES E. 04/28/2022 10:00 A M Medical Record Number: [id number] Patient Account Number: [id number] Date of Birth/Sex: Treating RN: 06-Nov-1965 (56 y.o. [name], Meta.Reding Primary Care Danniel Grenz: [name] Other Clinician: [name] Referring Tekoa Amon: Treating Chelcey Caputo/Extender: [name] in Treatment: 4 Visit Information History Since Last Visit All ordered tests and consults were completed: Yes Patient Arrived: Ambulatory Added or deleted any medications: No Arrival Time: 09:27 Any new allergies or adverse reactions: No Accompanied By: None Had a fall or experienced change in No Transfer Assistance: None activities of daily living that may affect Patient Identification Verified: Yes risk of falls: Secondary Verification Process Completed: Yes Signs or symptoms of abuse/neglect since last visito No Patient Requires Transmission-Based Precautions: No Hospitalized since last visit: No Patient Has Alerts: No Implantable device outside of the clinic excluding No cellular tissue based products placed in the center since last visit: Pain Present Now: No Electronic Signature(s) Signed: 04/28/2022 2:51:57 PM By: [name] EMT Entered By: [name] on 04/28/2022 14:51:57 -------------------------------------------------------------------------------- Encounter Discharge Information Details Patient Name: Date of Service: Dillon Dun MES E. 04/28/2022 10:00 A M Medical Record Number: [id number] Patient Account Number: [id number] Date of Birth/Sex: Treating RN: 1965/08/28 (56 y.o. [name] Primary Care Jaylani Mcguinn: [name] Other Clinician: [name] Referring Delaynie Stetzer: Treating Inocencio Roy/Extender: [name] in Treatment:  4 Encounter Discharge Information Items Discharge Condition: Stable Ambulatory Status: Ambulatory Discharge Destination: Home Transportation: Private Auto Accompanied By: None Schedule Follow-up Appointment: Yes Clinical Summary of Care: Electronic Signature(s) Signed: 04/28/2022 2:55:54 PM By: [name] EMT Entered By: [name] on 04/28/2022 14:55:54 [name] ([id number]) [id number] Page 2 of 2 -------------------------------------------------------------------------------- Vitals Details Patient Name: Date of Service: Dillon Dun MES E. 04/28/2022 10:00 A M Medical Record Number: [id number] Patient Account Number: [id number] Date of Birth/Sex: Treating RN: 06-25-1965 (55 y.o. [name] Primary Care Jesica Goheen: [name] Other Clinician: [name] Referring Karolee Meloni: Treating Arlyss Weathersby/Extender: [name] in Treatment: 4 Vital Signs Time Taken: 09:47 Temperature (F): 97.3 Height (in): 70 Pulse (bpm): 92 Weight (lbs): 210 Respiratory Rate (breaths/min): 18 Body Mass Index (BMI): 30.1 Blood Pressure (mmHg): 172/73 Capillary Blood Glucose (mg/dl): 272 Reference Range: 80 - 120 mg / dl Electronic Signature(s) Signed: 04/28/2022 2:52:32 PM By: [name] EMT Entered By: [name] on 04/28/2022 14:52:32

## 2022-04-28 NOTE — Progress Notes (Addendum)
FELICIA, BOTH ([id number]) [id number] Page 1 of 2 Visit Report for 04/27/2022 Arrival Information Details Patient Name: Date of Service: [name] 04/27/2022 10:00 A M Medical Record Number: [id number] Patient Account Number: [id number] Date of Birth/Sex: Treating RN: [birth year] (56 y.o. [name] Primary Care Joretta Eads: [name] Other Clinician: [name] Referring Jahking Lesser: Treating Venise Ellingwood/Extender: [name] in Treatment: 3 Visit Information History Since Last Visit All ordered tests and consults were completed: Yes Patient Arrived: Ambulatory Added or deleted any medications: No Arrival Time: 09:30 Any new allergies or adverse reactions: No Accompanied By: None Had a fall or experienced change in No Transfer Assistance: None activities of daily living that may affect Patient Identification Verified: Yes risk of falls: Secondary Verification Process Completed: Yes Signs or symptoms of abuse/neglect since last visito No Patient Requires Transmission-Based Precautions: No Hospitalized since last visit: No Patient Has Alerts: No Implantable device outside of the clinic excluding No cellular tissue based products placed in the center since last visit: Pain Present Now: No Notes late entry due to [facility] charting problems. Entry from paper charting. Electronic Signature(s) Signed: 04/28/2022 9:30:41 AM By: [name] EMT Entered By: [name] on 04/28/2022 09:30:41 -------------------------------------------------------------------------------- Encounter Discharge Information Details Patient Name: Date of Service: [name] 04/27/2022 10:00 A M Medical Record Number: [id number] Patient Account Number: [id number] Date of Birth/Sex: Treating RN: Dec 14, 1965 (56 y.o. [name] Primary Care Sadiel Mota: [name] Other Clinician: [name] Referring  Kalayna Noy: Treating Michaiah Maiden/Extender: [name] in Treatment: 3 Encounter Discharge Information Items Discharge Condition: Stable Ambulatory Status: Ambulatory Discharge Destination: Home Transportation: Private Auto Accompanied By: None Schedule Follow-up Appointment: Yes Clinical Summary of Care: Notes late entry due to [facility] charting problems. Entry from paper charting. Electronic Signature(s) Signed: 04/28/2022 10:37:34 AM By: [name] EMT Entered By: [name] on 04/28/2022 10:37:34 [name] ([id number]) [id number] Page 2 of 2 -------------------------------------------------------------------------------- Vitals Details Patient Name: Date of Service: [name] 04/27/2022 10:00 A M Medical Record Number: [id number] Patient Account Number: [id number] Date of Birth/Sex: Treating RN: 1965-12-27 (56 y.o. [name] Primary Care Blade Scheff: [name] Other Clinician: [name] Referring Damauri Minion: Treating Arta Stump/Extender: [name] in Treatment: 3 Vital Signs Time Taken: 09:49 Temperature (F): 97.7 Height (in): 70 Pulse (bpm): 96 Weight (lbs): 210 Respiratory Rate (breaths/min): 18 Body Mass Index (BMI): 30.1 Blood Pressure (mmHg): 154/85 Capillary Blood Glucose (mg/dl): 136 Reference Range: 80 - 120 mg / dl Notes late entry due to [facility] charting problems. Entry from paper charting. Electronic Signature(s) Signed: 04/28/2022 9:31:37 AM By: [name] EMT Previous Signature: 04/28/2022 9:31:23 AM Version By: [name] EMT Entered By: [name] on 04/28/2022 09:31:36

## 2022-04-28 NOTE — Progress Notes (Signed)
[name] ([id number]) [id number] Page 1 of 2 Visit Report for 04/28/2022 HBO Details Patient Name: Date of Service: [name] MES E. 04/28/2022 10:00 A M Medical Record Number: [id number] Patient Account Number: [id number] Date of Birth/Sex: Treating RN: 06-Sep-1965 (56 y.o. [name] Primary Care Gema Ringold: [name] Other Clinician: [name] Referring Kalup Jaquith: Treating Lorae Roig/Extender: [name] in Treatment: 4 HBO Treatment Course Details Treatment Course Number: 1 Ordering Diva Lemberger: [name] T Treatments Ordered: otal 40 HBO Treatment Start Date: 04/09/2022 HBO Indication: Late Effect of Radiation HBO Treatment Details Treatment Number: 14 Patient Type: Outpatient Chamber Type: Monoplace Chamber Serial #: [id number] Treatment Protocol: 2.0 ATA with 90 minutes oxygen, and no air breaks Treatment Details Compression Rate Down: 2.0 psi / minute De-Compression Rate Up: 2.0 psi / minute Air breaks and breathing Decompress Decompress Compress Tx Pressure Begins Reached periods Begins Ends (leave unused spaces blank) Chamber Pressure (ATA 1 2 ------2 1 ) Clock Time (24 hr) 09:55 10:06 - - - - - - 11:36 11:43 Treatment Length: 108 (minutes) Treatment Segments: 4 Vital Signs Capillary Blood Glucose Reference Range: 80 - 120 mg / dl HBO Diabetic Blood Glucose Intervention Range: <131 mg/dl or >249 mg/dl Time Vitals Blood Respiratory Capillary Blood Glucose Pulse Action Type: Pulse: Temperature: Taken: Pressure: Rate: Glucose (mg/dl): Meter #: Oximetry (%) Taken: Pre 09:47 172/73 92 18 97.3 272 Post 11:49 182/90 80 16 97.2 201 Treatment Response Treatment Toleration: Well Treatment Completion Status: Treatment Completed without Adverse Event Physician HBO Attestation: I certify that I supervised this HBO treatment in accordance with Medicare guidelines. A trained emergency response  team is readily available per Yes hospital policies and procedures. Continue HBOT as ordered. Yes Electronic Signature(s) Unsigned Previous Signature: 04/28/2022 2:54:58 PM Version By: [name] EMT Entered By: [name] on 04/28/2022 16:47:16 Signature(s): LEVEN, HOEL ([id number]) [id number] Date(s): [id number].pdf Page 2 of 2 -------------------------------------------------------------------------------- HBO Safety Checklist Details Patient Name: Date of Service: [name] MES E. 04/28/2022 10:00 A M Medical Record Number: [id number] Patient Account Number: [id number] Date of Birth/Sex: Treating RN: [birth year] (56 y.o. [name], Tammi Klippel Primary Care Quay Simkin: [name] Other Clinician: [name] Referring Jalacia Mattila: Treating Ahmyah Gidley/Extender: [name] in Treatment: 4 [name] Safety Checklist Consent Form Signed Patient voided / foley secured and emptied When did you last eato 0830 Last dose of injectable or oral agent 0900 Ostomy pouch emptied and vented if applicable NA All implantable devices assessed, documented and approved NA Intravenous access site secured and place NA Valuables secured Linens and cotton and cotton/polyester blend (less than 51% polyester) Personal oil-based products / skin lotions / body lotions removed Wigs or hairpieces removed NA Smoking or tobacco materials removed Books / newspapers / magazines / loose paper removed Cologne, aftershave, perfume and deodorant removed Jewelry removed (may wrap wedding band) Make-up removed NA Hair care products removed Battery operated devices (external) removed Heating patches and chemical warmers removed Titanium eyewear removed NA Nail polish cured greater than 10 hours NA Casting material cured greater than 10 hours NA Hearing aids removed NA Loose dentures or partials removed NA Prosthetics have been  removed NA Patient demonstrates correct use of air break device (if applicable) Patient concerns have been addressed Patient grounding bracelet on and cord attached to chamber Specifics for Inpatients (complete in addition to above) Medication sheet sent with patient NA Intravenous medications needed or due during therapy sent with patient NA Drainage  tubes (e.g. nasogastric tube or chest tube secured and vented) NA Endotracheal or Tracheotomy tube secured NA Cuff deflated of air and inflated with saline NA Airway suctioned NA Notes The safety check was done before the treatment was started. Electronic Signature(s) Signed: 04/28/2022 2:54:03 PM By: [name] EMT Entered By: [name] on 04/28/2022 14:54:03

## 2022-04-28 NOTE — Progress Notes (Signed)
MANAS, HICKLING ([id number]) [id number] Page 1 of 2 Visit Report for 04/27/2022 HBO Details Patient Name: Date of Service: [name] MES E. 04/27/2022 10:00 A M Medical Record Number: [id number] Patient Account Number: [id number] Date of Birth/Sex: Treating RN: 05/30/1966 (56 y.o. [name] Primary Care Mauro Arps: [name] Other Clinician: [name] Referring Gerrell Tabet: Treating Trigg Delarocha/Extender: [name] in Treatment: 3 HBO Treatment Course Details Treatment Course Number: 1 Ordering Avarey Yaeger: [name] T Treatments Ordered: otal 40 HBO Treatment Start Date: 04/09/2022 HBO Indication: Late Effect of Radiation HBO Treatment Details Treatment Number: 13 Patient Type: Outpatient Chamber Type: Monoplace Chamber Serial #: [id number] Treatment Protocol: 2.0 ATA with 90 minutes oxygen, and no air breaks Treatment Details Compression Rate Down: 2.0 psi / minute De-Compression Rate Up: 2.0 psi / minute Air breaks and breathing Decompress Decompress Compress Tx Pressure Begins Reached periods Begins Ends (leave unused spaces blank) Chamber Pressure (ATA 1 2 ------2 1 ) Clock Time (24 hr) 10:13 10:23 - - - - - - 11:53 12:00 Treatment Length: 107 (minutes) Treatment Segments: 4 Vital Signs Capillary Blood Glucose Reference Range: 80 - 120 mg / dl HBO Diabetic Blood Glucose Intervention Range: <131 mg/dl or >249 mg/dl Type: Time Vitals Blood Pulse: Respiratory Temperature: Capillary Blood Glucose Pulse Action Taken: Pressure: Rate: Glucose (mg/dl): Meter #: Oximetry (%) Taken: Pre 09:49 154/85 96 18 97.7 136 Post 12:05 162/78 82 16 97.3 97 Patient given Glucerna. Waited for 12mn after. Treatment Response Treatment Toleration: Well Treatment Completion Status: Treatment Completed without Adverse Event Treatment [facility]ing. The patient  post CBG was 97. The patient was given 8oz of Glucerna and waited 138m. Stated that he felt OK. Physician HBO Attestation: I certify that I supervised this HBO treatment in accordance with Medicare guidelines. A trained emergency response team is readily available per Yes hospital policies and procedures. Continue HBOT as ordered. Yes Electronic Signature(s) Signed: 04/28/2022 6:10:28 PM By: HoKalman ShanO Previous Signature: 04/28/2022 10:34:04 AM Version By: GrValeria BatmanMT Entered By: HoKalman Shann 04/28/2022 18:07:46 OLMarval [id number]) [id number] Page 2 of 2 -------------------------------------------------------------------------------- HBO Safety Checklist Details Patient Name: Date of Service: O Dillon DunES E. 04/27/2022 10:00 A M Medical Record Number: [id number] Account Number: [id number] of Birth/Sex: Treating RN: [birth year].o. M)Dillon Contesrimary Care Brynlyn Dade: SoReynold Bowenther Clinician: GrValeria Batmaneferring Johncarlos Holtsclaw: Treating Jackelyne Sayer/Extender: HoBetsey Holidayn Treatment: 3 [name] Safety Checklist Consent Form Signed Patient voided / foley secured and emptied When did you last eato 0830 Last dose of injectable or oral agent 0900 Ostomy pouch emptied and vented if applicable NA All implantable devices assessed, documented and approved NA Intravenous access site secured and place NA Valuables secured Linens and cotton and cotton/polyester blend (less than 51% polyester) Personal oil-based products / skin lotions / body lotions removed Wigs or hairpieces removed NA Smoking or tobacco materials removed Books / newspapers / magazines / loose paper removed Cologne, aftershave, perfume and deodorant removed Jewelry removed (may wrap wedding band) Make-up removed NA Hair care products removed Battery operated devices (external)  removed Heating patches and chemical warmers removed Titanium eyewear removed NA Nail polish cured greater than 10 hours NA Casting material cured greater than 10 hours NA Hearing aids removed NA Loose dentures or partials removed NA Prosthetics have been removed NA Patient demonstrates correct use of  air break device (if applicable) Patient concerns have been addressed Patient grounding bracelet on and cord attached to chamber Specifics for Inpatients (complete in addition to above) Medication sheet sent with patient NA Intravenous medications needed or due during therapy sent with patient NA Drainage tubes (e.g. nasogastric tube or chest tube secured and vented) NA Endotracheal or Tracheotomy tube secured NA Cuff deflated of air and inflated with saline NA Airway suctioned NA Notes late entry due to [facility] charting problems. Entry from paper charting. The safety checklist was done before the treatment was started. Electronic Signature(s) Signed: 04/28/2022 9:32:50 AM By: [name] EMT Entered By: [name] on 04/28/2022 09:32:49

## 2022-04-29 ENCOUNTER — Encounter (HOSPITAL_BASED_OUTPATIENT_CLINIC_OR_DEPARTMENT_OTHER): Payer: BC Managed Care – PPO | Admitting: General Surgery

## 2022-04-29 DIAGNOSIS — N3041 Irradiation cystitis with hematuria: Secondary | ICD-10-CM | POA: Diagnosis not present

## 2022-04-29 LAB — GLUCOSE, CAPILLARY
Glucose-Capillary: 239 mg/dL — ABNORMAL HIGH (ref 70–99)
Glucose-Capillary: 244 mg/dL — ABNORMAL HIGH (ref 70–99)

## 2022-04-29 NOTE — Progress Notes (Signed)
GAJE, TENNYSON ([id number]) [id number] Page 1 of 2 Visit Report for 04/28/2022 Problem List Details Patient Name: Date of Service: Dillon Dun MES E. 04/28/2022 10:00 A M Medical Record Number: [id number] Patient Account Number: [id number] Date of Birth/Sex: Treating RN: 06-11-1966 (56 y.o. 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 04/28/2022 Medical Record Number: [id number] Patient Account Number: [id number] Date of Birth/Sex: Treating RN: May 03, 1966 (56 y.o. [name] Primary Care Provider: [name] Other Clinician: [name] Referring Provider: Treating Provider/Extender: [name] in Treatment: 4 Diagnosis Coding ICD-10 Codes Code Description N30.41 Irradiation cystitis with hematuria [name], [name] ([id number]) [phone].pdf Page 2 of 2 R31.9 Hematuria, unspecified C61 Malignant neoplasm of prostate E10.3529 Type 1 diabetes mellitus with proliferative diabetic retinopathy  with traction retinal detachment involving the macula, unspecified eye Facility Procedures : CPT4 Code: [id number] Description: G0277-(Facility Use Only) HBOT full body chamber, 27mn , ICD-10 Diagnosis Description 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 Hematuria, unspecified C61 Malignant neoplasm of prostate Modifier: Quantity: 4 Physician Procedures : CPT4 Code Description Modifier [id number] PHYS HYPERBARIC OXYGEN THERAPY ICD-10 Diagnosis Description 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 Hematuria, unspecified C61 Malignant neoplasm of prostate Quantity: 1 Electronic Signature(s) Signed: 04/28/2022 2:55:16 PM By: GValeria BatmanEMT Signed: 04/28/2022 6:10:28 PM By: HKalman ShanDO Entered By: GValeria Batmanon 04/28/2022 14:55:16

## 2022-04-29 NOTE — Progress Notes (Signed)
FAIZON, CAPOZZI ([id number]) [id number] Page 1 of 2 Visit Report for 04/29/2022 Arrival Information Details Patient Name: Date of Service: Dillon Dun MES E. 04/29/2022 10:00 A M Medical Record Number: [id number] Patient Account Number: [id number] Date of Birth/Sex: Treating RN: 15-Aug-1965 (56 y.o. [name] Primary Care Calianne Larue: [name] Other Clinician: Donavan Burnet Referring Teancum Brule: Treating Khaleel Beckom/Extender: [name] in Treatment: 4 Visit Information History Since Last Visit All ordered tests and consults were completed: Yes Patient Arrived: Ambulatory Added or deleted any medications: No Arrival Time: 09:29 Any new allergies or adverse reactions: No Accompanied By: self Had a fall or experienced change in No Transfer Assistance: None activities of daily living that may affect Patient Identification Verified: Yes risk of falls: Secondary Verification Process Completed: Yes Signs or symptoms of abuse/neglect since last visito No Patient Requires Transmission-Based Precautions: No Hospitalized since last visit: No Patient Has Alerts: No Implantable device outside of the clinic excluding No cellular tissue based products placed in the center since last visit: Pain Present Now: No Electronic Signature(s) Signed: 04/29/2022 4:08:17 PM By: Donavan Burnet CHT EMT BS , , Entered By: Donavan Burnet on 04/29/2022 16:08:16 -------------------------------------------------------------------------------- Encounter Discharge Information Details Patient Name: Date of Service: Dillon Dun MES E. 04/29/2022 10:00 A M Medical Record Number: [id number] Patient Account Number: [id number] Date of Birth/Sex: Treating RN: [birth year] (56 y.o. [name] Primary Care Graceson Nichelson: [name] Other Clinician: Donavan Burnet Referring Micayla Brathwaite: Treating [name]/Extender: [name] in Treatment: 4 Encounter Discharge Information Items Discharge Condition: Stable Ambulatory Status: Ambulatory Discharge Destination: Home Transportation: Private Auto Accompanied By: self Schedule Follow-up Appointment: No Clinical Summary of Care: Electronic Signature(s) Signed: 04/29/2022 4:11:54 PM By: Donavan Burnet CHT EMT BS , , Entered By: Donavan Burnet on 04/29/2022 16:11:54 [name] ([id number]) [id number] Page 2 of 2 -------------------------------------------------------------------------------- Vitals Details Patient Name: Date of Service: Dillon Dun MES E. 04/29/2022 10:00 A M Medical Record Number: [id number] Patient Account Number: [id number] Date of Birth/Sex: Treating RN: 1965-08-03 (56 y.o. [name] Primary Care Brelee Renk: [name] Other Clinician: [name] Referring Welcome Fults: Treating Aitanna Haubner/Extender: [name] in Treatment: 4 Vital Signs Time Taken: 10:15 Temperature (F): 97.5 Height (in): 70 Pulse (bpm): 93 Weight (lbs): 210 Respiratory Rate (breaths/min): 16 Body Mass Index (BMI): 30.1 Blood Pressure (mmHg): 150/85 Capillary Blood Glucose (mg/dl): 239 Reference Range: 80 - 120 mg / dl Electronic Signature(s) Signed: 04/29/2022 4:08:54 PM By: Donavan Burnet CHT EMT BS , , Entered By: Donavan Burnet on 04/29/2022 16:08:54

## 2022-04-29 NOTE — Progress Notes (Signed)
TYRANN, DONAHO ([id number]) [id number] Page 1 of 2 Visit Report for 04/27/2022 Problem List Details Patient Name: Date of Service: Dillon Dun MES E. 04/27/2022 10:00 A M Medical Record Number: [id number] Patient Account Number: [id number] Date of Birth/Sex: Treating RN: 1965-08-02 (56 y.o. 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 04/27/2022 Medical Record Number: [id number] Patient Account Number: [id number] Date of Birth/Sex: Treating RN: [birth year] (57 y.o. [name] Primary Care Provider: [name] Other Clinician: [name] Referring Provider: Treating Provider/Extender: [name] in Treatment: 3 Diagnosis Coding ICD-10 Codes Code Description N30.41 Irradiation cystitis with hematuria ISSAIH, KAUS ([id number]) [phone].pdf Page 2 of 2 R31.9 Hematuria, unspecified C61 Malignant neoplasm of prostate E10.3529 Type 1 diabetes mellitus with proliferative diabetic  retinopathy with traction retinal detachment involving the macula, unspecified eye Facility Procedures : CPT4 Code Description: [id number] G0277-(Facility Use Only) HBOT full body chamber, 66mn , ICD-10 Diagnosis Description 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 Hematuria, unspecified C61 Malignant neoplasm of prostate E10.3529 Type 1 diabetes  mellitus with proliferative diabetic retinopathy with traction retinal unspecified eye Modifier: detachment involvin Quantity: 4 g the macula, Physician Procedures : CPT4 Code Description Modifier [id number] 62863- WC PHYS HYPERBARIC OXYGEN THERAPY ICD-10 Diagnosis Description 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 Hematuria, unspecified C61 Malignant neoplasm of prostate E10.3529 Type 1 diabetes mellitus  with proliferative diabetic retinopathy with traction retinal detachment involvin unspecified eye Quantity: 1 g the macula, Electronic Signature(s) Signed: 04/28/2022 10:35:19 AM By: GValeria BatmanEMT Signed: 04/28/2022 6:10:28 PM By: HKalman ShanDO Entered By: GValeria Batmanon 04/28/2022 10:35:18

## 2022-04-30 ENCOUNTER — Encounter (HOSPITAL_BASED_OUTPATIENT_CLINIC_OR_DEPARTMENT_OTHER): Payer: BC Managed Care – PPO | Admitting: General Surgery

## 2022-04-30 DIAGNOSIS — N3041 Irradiation cystitis with hematuria: Secondary | ICD-10-CM | POA: Diagnosis not present

## 2022-04-30 LAB — GLUCOSE, CAPILLARY
Glucose-Capillary: 271 mg/dL — ABNORMAL HIGH (ref 70–99)
Glucose-Capillary: 248 mg/dL — ABNORMAL HIGH (ref 70–99)

## 2022-04-30 NOTE — Progress Notes (Signed)
Dillon, [name] ([id number]) [id number] Page 1 of 2 Visit Report for 04/30/2022 HBO Details Patient Name: Date of Service: [name] MES E. 04/30/2022 10:00 A M Medical Record Number: [id number] Patient Account Number: [id number] Date of Birth/Sex: Treating RN: [birth year] (56 y.o. [name] Primary Care Clio Gerhart: [name] Other Clinician: [name] Referring Emersyn Wyss: Treating Luther Newhouse/Extender: [name] in Treatment: 4 HBO Treatment Course Details Treatment Course Number: 1 Ordering Aeriel Boulay: [name] T Treatments Ordered: otal 40 HBO Treatment Start Date: 04/09/2022 HBO Indication: Late Effect of Radiation HBO Treatment Details Treatment Number: 16 Patient Type: Outpatient Chamber Type: Monoplace Chamber Serial #: [id number] Treatment Protocol: 2.0 ATA with 90 minutes oxygen, and no air breaks Treatment Details Compression Rate Down: 2.0 psi / minute De-Compression Rate Up: 2.0 psi / minute Air breaks and breathing Decompress Decompress Compress Tx Pressure Begins Reached periods Begins Ends (leave unused spaces blank) Chamber Pressure (ATA 1 2 ------2 1 ) Clock Time (24 hr) 09:55 10:05 - - - - - - 11:35 11:43 Treatment Length: 108 (minutes) Treatment Segments: 4 Vital Signs Capillary Blood Glucose Reference Range: 80 - 120 mg / dl HBO Diabetic Blood Glucose Intervention Range: <131 mg/dl or >249 mg/dl Time Vitals Blood Respiratory Capillary Blood Glucose Pulse Action Type: Pulse: Temperature: Taken: Pressure: Rate: Glucose (mg/dl): Meter #: Oximetry (%) Taken: Pre 09:48 165/87 92 18 97.5 271 Post 11:47 177/92 85 20 97.9 248 Treatment Response Treatment Toleration: Well Treatment Completion Status: Treatment Completed without Adverse Event Physician HBO Attestation: I certify that I supervised this HBO treatment in accordance with Medicare guidelines. A trained emergency response  team is readily available per Yes hospital policies and procedures. Continue HBOT as ordered. Yes Electronic Signature(s) Signed: 04/30/2022 3:05:57 PM By: [name] MD FACS Previous Signature: 04/30/2022 2:04:53 PM Version By: [name] EMT Entered By: [name] on 04/30/2022 15:05:56 [name] ([id number]) [id number] Page 2 of 2 -------------------------------------------------------------------------------- HBO Safety Checklist Details Patient Name: Date of Service: [name] MES E. 04/30/2022 10:00 A M Medical Record Number: [id number] Patient Account Number: [id number] Date of Birth/Sex: Treating RN: 03/06/1966 (56 y.o. [name], Meta.Reding Primary Care Julus Kelley: [name] Other Clinician: [name] Referring Kasra Melvin: Treating Eula Jaster/Extender: [name] in Treatment: 4 [name] Safety Checklist Consent Form Signed Patient voided / foley secured and emptied When did you last eato 0800 Last dose of injectable or oral agent 0800 Ostomy pouch emptied and vented if applicable NA All implantable devices assessed, documented and approved NA Intravenous access site secured and place NA Valuables secured Linens and cotton and cotton/polyester blend (less than 51% polyester) Personal oil-based products / skin lotions / body lotions removed Wigs or hairpieces removed NA Smoking or tobacco materials removed Books / newspapers / magazines / loose paper removed Cologne, aftershave, perfume and deodorant removed Jewelry removed (may wrap wedding band) Make-up removed NA Hair care products removed Battery operated devices (external) removed Heating patches and chemical warmers removed Titanium eyewear removed NA Nail polish cured greater than 10 hours NA Casting material cured greater than 10 hours NA Hearing aids removed NA Loose dentures or partials  removed NA Prosthetics have been removed NA Patient demonstrates correct use of air break device (if applicable) Patient concerns have been addressed Patient grounding bracelet on and cord attached to chamber Specifics for Inpatients (complete in addition to above) Medication sheet sent with patient NA Intravenous medications needed or due during therapy  sent with patient NA Drainage tubes (e.g. nasogastric tube or chest tube secured and vented) NA Endotracheal or Tracheotomy tube secured NA Cuff deflated of air and inflated with saline NA Airway suctioned NA Notes The safety checklist was done before the treatment was started. Electronic Signature(s) Signed: 04/30/2022 2:03:24 PM By: [name] EMT Entered By: [name] on 04/30/2022 14:03:24

## 2022-04-30 NOTE — Progress Notes (Signed)
GRANTLEY, SAVAGE ([id number]) [id number] Page 1 of 2 Visit Report for 04/30/2022 Problem List Details Patient Name: Date of Service: Dillon Dun MES E. 04/30/2022 10:00 A M Medical Record Number: [id number] Patient Account Number: [id number] Date of Birth/Sex: Treating RN: 13-Mar-1966 (56 y.o. 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 04/30/2022 Medical Record Number: [id number] Patient Account Number: [id number] Date of Birth/Sex: Treating RN: 1965/09/27 (56 y.o. [name] Primary Care Provider: [name] Other Clinician: [name] Referring Provider: Treating Provider/Extender: [name] in Treatment: 4 Diagnosis Coding ICD-10 Codes Code Description N30.41 Irradiation cystitis with hematuria [name], [name] ([id number]) [id number] Page 2 of 2 R31.9 Hematuria, unspecified C61 Malignant neoplasm of prostate E10.3529 Type 1 diabetes mellitus with proliferative diabetic  retinopathy with traction retinal detachment involving the macula, unspecified eye Facility Procedures : CPT4 Code: [id number] Description: G0277-(Facility Use Only) HBOT full body chamber, 47mn , ICD-10 Diagnosis Description 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 Hematuria, unspecified C61 Malignant neoplasm of prostate Modifier: Quantity: 4 Physician Procedures : CPT4 Code Description Modifier [id number]- WC PHYS HYPERBARIC OXYGEN THERAPY ICD-10 Diagnosis Description 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 Hematuria, unspecified C61 Malignant neoplasm of prostate Quantity: 1 Electronic Signature(s) Signed: 04/30/2022 2:05:43 PM By: GValeria BatmanEMT Signed: 04/30/2022 3:05:34 PM By: CFredirick MaudlinMD FACS Entered By: GValeria Batmanon 04/30/2022 14:05:42

## 2022-04-30 NOTE — Progress Notes (Signed)
TYRRELL, STEPHENS ([id number]) [id number] Page 1 of 1 Visit Report for 04/29/2022 SuperBill Details Patient Name: Date of Service: [name]. 04/29/2022 Medical Record Number: [id number] Patient Account Number: [id number] Date of Birth/Sex: Treating RN: 1966/04/28 (56 y.o. 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, unspecified eye Facility Procedures CPT4 Code Description Modifier Quantity [id number] G0277-(Facility Use Only) HBOT full body chamber, 62mn , 4 ICD-10 Diagnosis Description 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 Hematuria, unspecified C61 Malignant neoplasm of prostate 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, unspecified eye Physician Procedures Quantity CPT4 Code Description Modifier [id number] 74451- WC PHYS HYPERBARIC OXYGEN THERAPY 1 ICD-10 Diagnosis Description 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 Hematuria, unspecified C61 Malignant neoplasm of prostate 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

## 2022-04-30 NOTE — Progress Notes (Signed)
REVANTH, NEIDIG ([id number]) [id number] Page 1 of 2 Visit Report for 04/30/2022 Arrival Information Details Patient Name: Date of Service: [name] MES E. 04/30/2022 10:00 A M Medical Record Number: [id number] Patient Account Number: [id number] Date of Birth/Sex: Treating RN: Mar 24, 1966 (56 y.o. [name], Meta.Reding Primary Care Raedyn Wenke: [name] Other Clinician: [name] Referring Latresa Gasser: Treating Kellyn Mansfield/Extender: [name] in Treatment: 4 Visit Information History Since Last Visit All ordered tests and consults were completed: Yes Patient Arrived: Ambulatory Added or deleted any medications: No Arrival Time: 09:30 Any new allergies or adverse reactions: No Accompanied By: None Had a fall or experienced change in No Transfer Assistance: None activities of daily living that may affect Patient Identification Verified: Yes risk of falls: Secondary Verification Process Completed: Yes Signs or symptoms of abuse/neglect since last visito No Patient Requires Transmission-Based Precautions: No Hospitalized since last visit: No Patient Has Alerts: No Implantable device outside of the clinic excluding No cellular tissue based products placed in the center since last visit: Pain Present Now: No Electronic Signature(s) Signed: 04/30/2022 1:55:30 PM By: [name] EMT Entered By: [name] on 04/30/2022 13:55:29 -------------------------------------------------------------------------------- Encounter Discharge Information Details Patient Name: Date of Service: [name] MES E. 04/30/2022 10:00 A M Medical Record Number: [id number] Patient Account Number: [id number] Date of Birth/Sex: Treating RN: January 27, 1966 (56 y.o. [name] Primary Care Meyer Arora: [name] Other Clinician: [name] Referring Markala Sitts: Treating Shellyann Wandrey/Extender: [name] in Treatment:  4 Encounter Discharge Information Items Discharge Condition: Stable Ambulatory Status: Ambulatory Discharge Destination: Home Transportation: Private Auto Accompanied By: None Schedule Follow-up Appointment: Yes Clinical Summary of Care: Electronic Signature(s) Signed: 04/30/2022 2:06:17 PM By: [name] EMT Entered By: [name] on 04/30/2022 14:06:17 [name] ([id number]) [id number] Page 2 of 2 -------------------------------------------------------------------------------- Vitals Details Patient Name: Date of Service: [name] MES E. 04/30/2022 10:00 A M Medical Record Number: [id number] Patient Account Number: [id number] Date of Birth/Sex: Treating RN: 1966/06/13 (56 y.o. [name] Primary Care Zacheriah Stumpe: [name] Other Clinician: [name] Referring Bader Stubblefield: Treating Rodger Giangregorio/Extender: [name] in Treatment: 4 Vital Signs Time Taken: 09:48 Temperature (F): 97.5 Height (in): 70 Pulse (bpm): 92 Weight (lbs): 210 Respiratory Rate (breaths/min): 18 Body Mass Index (BMI): 30.1 Blood Pressure (mmHg): 165/87 Capillary Blood Glucose (mg/dl): 271 Reference Range: 80 - 120 mg / dl Electronic Signature(s) Signed: 04/30/2022 1:57:53 PM By: [name] EMT Entered By: [name] on 04/30/2022 13:57:53

## 2022-04-30 NOTE — Progress Notes (Signed)
Dillon, [name] ([id number]) [id number] Page 1 of 2 Visit Report for 04/29/2022 HBO Details Patient Name: Date of Service: [name] MES E. 04/29/2022 10:00 A M Medical Record Number: [id number] Patient Account Number: [id number] Date of Birth/Sex: Treating RN: 1966/01/06 (56 y.o. [name] Primary Care Jquan Egelston: [name] Other Clinician: Donavan Burnet Referring Cadon Raczka: Treating Milda Lindvall/Extender: [name] in Treatment: 4 HBO Treatment Course Details Treatment Course Number: 1 Ordering Kort Stettler: [name] T Treatments Ordered: otal 40 HBO Treatment Start Date: 04/09/2022 HBO Indication: Late Effect of Radiation HBO Treatment Details Treatment Number: 15 Patient Type: Outpatient Chamber Type: Monoplace Chamber Serial #: [id number] Treatment Protocol: 2.0 ATA with 90 minutes oxygen, and no air breaks Treatment Details Compression Rate Down: 2.0 psi / minute De-Compression Rate Up: 2.0 psi / minute Air breaks and breathing Decompress Decompress Compress Tx Pressure Begins Reached periods Begins Ends (leave unused spaces blank) Chamber Pressure (ATA 1 2 ------2 1 ) Clock Time (24 hr) 10:20 10:29 - - - - - - 11:59 12:10 Treatment Length: 110 (minutes) Treatment Segments: 4 Vital Signs Capillary Blood Glucose Reference Range: 80 - 120 mg / dl HBO Diabetic Blood Glucose Intervention Range: <131 mg/dl or >249 mg/dl Type: Time Vitals Blood Respiratory Capillary Blood Glucose Pulse Action Pulse: Temperature: Taken: Pressure: Rate: Glucose (mg/dl): Meter #: Oximetry (%) Taken: Pre 10:15 150/85 93 16 97.5 239 1 none per protocol Post 12:13 107/87 85 18 97.3 244 1 none per protocol Treatment Response Treatment Toleration: Well Treatment Completion Status: Treatment Completed without Adverse Event Physician HBO Attestation: I certify that I supervised this HBO treatment in accordance with  Medicare guidelines. A trained emergency response team is readily available per Yes hospital policies and procedures. Continue HBOT as ordered. Yes Electronic Signature(s) Signed: 04/29/2022 5:15:46 PM By: [name] MD FACS Previous Signature: 04/29/2022 4:11:04 PM Version By: Donavan Burnet CHT EMT BS , , Entered By: [name] on 04/29/2022 17:15:46 [name] ([id number]) [id number] Page 2 of 2 -------------------------------------------------------------------------------- HBO Safety Checklist Details Patient Name: Date of Service: [name] MES E. 04/29/2022 10:00 A M Medical Record Number: [id number] Patient Account Number: [id number] Date of Birth/Sex: Treating RN: [birth year] (56 y.o. [name] Primary Care Mry Lamia: [name] Other Clinician: Donavan Burnet Referring Serenna Deroy: Treating Fabion Gatson/Extender: [name] in Treatment: 4 [name] Safety Checklist Consent Form Signed Patient voided / foley secured and emptied When did you last eato 0800 Last dose of injectable or oral agent 0830 Ostomy pouch emptied and vented if applicable NA All implantable devices assessed, documented and approved NA Intravenous access site secured and place NA Valuables secured Linens and cotton and cotton/polyester blend (less than 51% polyester) Personal oil-based products / skin lotions / body lotions removed Wigs or hairpieces removed NA Smoking or tobacco materials removed NA Books / newspapers / magazines / loose paper removed Cologne, aftershave, perfume and deodorant removed Jewelry removed (may wrap wedding band) Make-up removed NA Hair care products removed Battery operated devices (external) removed Heating patches and chemical warmers removed Titanium eyewear removed Nail polish cured greater than 10 hours NA Casting material cured greater than 10  hours NA Hearing aids removed NA Loose dentures or partials removed NA Prosthetics have been removed NA Patient demonstrates correct use of air break device (if applicable) Patient concerns have been addressed Patient grounding bracelet on and cord attached to chamber Specifics for Inpatients (complete in addition to above) Medication  sheet sent with patient NA Intravenous medications needed or due during therapy sent with patient NA Drainage tubes (e.g. nasogastric tube or chest tube secured and vented) NA Endotracheal or Tracheotomy tube secured NA Cuff deflated of air and inflated with saline NA Airway suctioned NA Notes Paper version used prior to treatment. Electronic Signature(s) Signed: 04/29/2022 4:10:02 PM By: Donavan Burnet CHT EMT BS , , Entered By: Donavan Burnet on 04/29/2022 16:10:01

## 2022-05-01 ENCOUNTER — Encounter (HOSPITAL_BASED_OUTPATIENT_CLINIC_OR_DEPARTMENT_OTHER): Payer: BC Managed Care – PPO | Admitting: Internal Medicine

## 2022-05-01 DIAGNOSIS — N3041 Irradiation cystitis with hematuria: Secondary | ICD-10-CM | POA: Diagnosis not present

## 2022-05-01 DIAGNOSIS — R319 Hematuria, unspecified: Secondary | ICD-10-CM | POA: Diagnosis not present

## 2022-05-01 DIAGNOSIS — C61 Malignant neoplasm of prostate: Secondary | ICD-10-CM | POA: Diagnosis not present

## 2022-05-01 LAB — GLUCOSE, CAPILLARY
Glucose-Capillary: 106 mg/dL — ABNORMAL HIGH (ref 70–99)
Glucose-Capillary: 154 mg/dL — ABNORMAL HIGH (ref 70–99)
Glucose-Capillary: 143 mg/dL — ABNORMAL HIGH (ref 70–99)

## 2022-05-01 NOTE — Progress Notes (Signed)
JEYDAN, BARNER ([id number]) [id number] Page 1 of 2 Visit Report for 05/01/2022 HBO Details Patient Name: Date of Service: Dillon Dun MES E. 05/01/2022 10:00 A M Medical Record Number: [id number] Patient Account Number: [id number] Date of Birth/Sex: Treating RN: 08/12/1965 (56 y.o. [name] Primary Care Ann Bohne: [name] Other Clinician: [name] Referring Teanna Elem: Treating Mack Thurmon/Extender: [name] in Treatment: 4 HBO Treatment Course Details Treatment Course Number: 1 Ordering Garris Melhorn: [name] T Treatments Ordered: otal 40 HBO Treatment Start Date: 04/09/2022 HBO Indication: Late Effect of Radiation HBO Treatment Details Treatment Number: 17 Patient Type: Outpatient Chamber Type: Monoplace Chamber Serial #: [id number] Treatment Protocol: 2.0 ATA with 90 minutes oxygen, and no air breaks Treatment Details Compression Rate Down: 2.0 psi / minute De-Compression Rate Up: 2.0 psi / minute Air breaks and breathing Decompress Decompress Compress Tx Pressure Begins Reached periods Begins Ends (leave unused spaces blank) Chamber Pressure (ATA 1 2 ------2 1 ) Clock Time (24 hr) 10:50 10:59 - - - - - - 12:29 12:37 Treatment Length: 107 (minutes) Treatment Segments: 4 Vital Signs Capillary Blood Glucose Reference Range: 80 - 120 mg / dl HBO Diabetic Blood Glucose Intervention Range: <131 mg/dl or >249 mg/dl Type: Time Vitals Blood Pulse: Respiratory Capillary Blood Glucose Pulse Action Temperature: Taken: Pressure: Rate: Glucose (mg/dl): Meter #: Oximetry (%) Taken: Pre 10:46 130/62 92 18 97.9 154 Post 12:46 158/62 78 18 97.3 143 Pre 10:11 106 Patient given 8 oz Glucerna Treatment Response Treatment Toleration: Well Treatment Completion Status: Treatment Completed without Adverse Event Physician HBO Attestation: I certify that I supervised this HBO treatment in accordance with  Medicare guidelines. A trained emergency response team is readily available per Yes hospital policies and procedures. Continue HBOT as ordered. Yes Electronic Signature(s) Signed: 05/04/2022 1:38:49 PM By: [name] DO Previous Signature: 05/01/2022 2:17:27 PM Version By: [name] EMT Previous Signature: 05/04/2022 11:34:26 AM Version By: [name] DO Entered By: [name] on 05/04/2022 13:37:21 [name] ([id number]) [id number] Page 2 of 2 -------------------------------------------------------------------------------- HBO Safety Checklist Details Patient Name: Date of Service: Dillon Dun MES E. 05/01/2022 10:00 A M Medical Record Number: [id number] Patient Account Number: [id number] Date of Birth/Sex: Treating RN: Aug 01, 1965 (56 y.o. [name] Primary Care Shirla Hodgkiss: [name] Other Clinician: [name] Referring Annastacia Duba: Treating Kweku Stankey/Extender: [name] in Treatment: 4 [name] Safety Checklist Consent Form Signed Patient voided / foley secured and emptied When did you last eato 0800 Last dose of injectable or oral agent 0830 Ostomy pouch emptied and vented if applicable NA All implantable devices assessed, documented and approved NA Intravenous access site secured and place NA Valuables secured Linens and cotton and cotton/polyester blend (less than 51% polyester) Personal oil-based products / skin lotions / body lotions removed Wigs or hairpieces removed NA Smoking or tobacco materials removed Books / newspapers / magazines / loose paper removed Cologne, aftershave, perfume and deodorant removed Jewelry removed (may wrap wedding band) Make-up removed NA Hair care products removed Battery operated devices (external) removed Heating patches and chemical warmers removed Titanium eyewear removed NA Nail polish cured greater than 10  hours NA Casting material cured greater than 10 hours NA Hearing aids removed NA Loose dentures or partials removed NA Prosthetics have been removed NA Patient demonstrates correct use of air break device (if applicable) Patient concerns have been addressed Patient grounding bracelet on and cord attached to chamber Specifics for Inpatients (complete  in addition to above) Medication sheet sent with patient NA Intravenous medications needed or due during therapy sent with patient NA Drainage tubes (Mooreg. nasogastric tube or chest tube secured and vented) NA Endotracheal or Tracheotomy tube secured NA Cuff deflated of air and inflated with saline NA Airway suctioned NA Notes The safety check was done before the treatment was started. Electronic Signature(s) Signed: 05/01/2022 2:15:28 PM By: [name] EMT Entered By: [name] on 05/01/2022 14:15:27

## 2022-05-01 NOTE — Progress Notes (Signed)
CARNEL, STEGMAN ([id number]) [id number] Page 1 of 2 Visit Report for 05/01/2022 Arrival Information Details Patient Name: Date of Service: Dillon Dun MES E. 05/01/2022 10:00 A M Medical Record Number: [id number] Patient Account Number: [id number] Date of Birth/Sex: Treating RN: 1965/07/13 (56 y.o. [name] Primary Care [name]: [name] Other Clinician: [name] Referring [name]: Treating [name]/Extender: [name] in Treatment: 4 Visit Information History Since Last Visit All ordered tests and consults were completed: Yes Patient Arrived: Ambulatory Added or deleted any medications: No Arrival Time: 10:06 Any new allergies or adverse reactions: No Accompanied By: None Had a fall or experienced change in No Transfer Assistance: None activities of daily living that may affect Patient Identification Verified: Yes risk of falls: Secondary Verification Process Completed: Yes Signs or symptoms of abuse/neglect since last visito No Patient Requires Transmission-Based Precautions: No Hospitalized since last visit: No Patient Has Alerts: No Implantable device outside of the clinic excluding No cellular tissue based products placed in the center since last visit: Pain Present Now: No Electronic Signature(s) Signed: 05/01/2022 2:12:54 PM By: [name] EMT Entered By: [name] on 05/01/2022 14:12:54 -------------------------------------------------------------------------------- Encounter Discharge Information Details Patient Name: Date of Service: Dillon Dun MES E. 05/01/2022 10:00 A M Medical Record Number: [id number] Patient Account Number: [id number] Date of Birth/Sex: Treating RN: 1965/06/23 (56 y.o. [name] Primary Care Valor [place]: [name] Other Clinician: [name] Referring Felissa Blouch: Treating Danetta Prom/Extender: [name] in  Treatment: 4 Encounter Discharge Information Items Discharge Condition: Stable Ambulatory Status: Ambulatory Discharge Destination: Home Transportation: Private Auto Accompanied By: None Schedule Follow-up Appointment: Yes Clinical Summary of Care: Electronic Signature(s) Signed: 05/01/2022 2:18:21 PM By: [name] EMT Entered By: [name] on 05/01/2022 14:18:21 [name] ([id number]) [id number] Page 2 of 2 -------------------------------------------------------------------------------- Vitals Details Patient Name: Date of Service: Dillon Dun MES E. 05/01/2022 10:00 A M Medical Record Number: [id number] Patient Account Number: [id number] Date of Birth/Sex: Treating RN: Jan 14, 1966 (56 y.o. [name] Primary Care Danessa Mensch: [name] Other Clinician: [name] Referring Kristeen Lantz: Treating Vencil Basnett/Extender: [name] in Treatment: 4 Vital Signs Time Taken: 10:46 Temperature (F): 97.9 Height (in): 70 Pulse (bpm): 92 Weight (lbs): 210 Respiratory Rate (breaths/min): 18 Body Mass Index (BMI): 30.1 Blood Pressure (mmHg): 130/62 Capillary Blood Glucose (mg/dl): 154 Reference Range: 80 - 120 mg / dl Electronic Signature(s) Signed: 05/01/2022 2:13:47 PM By: [name] EMT Entered By: [name] on 05/01/2022 14:13:46

## 2022-05-03 ENCOUNTER — Encounter (HOSPITAL_COMMUNITY): Payer: Self-pay | Admitting: Emergency Medicine

## 2022-05-03 ENCOUNTER — Emergency Department (HOSPITAL_COMMUNITY)
Admission: EM | Admit: 2022-05-03 | Discharge: 2022-05-03 | Disposition: A | Discharge status: Home / Self Care | Payer: BC Managed Care – PPO | Attending: Emergency Medicine | Admitting: Emergency Medicine

## 2022-05-03 ENCOUNTER — Other Ambulatory Visit: Payer: Self-pay

## 2022-05-03 DIAGNOSIS — R109 Unspecified abdominal pain: Secondary | ICD-10-CM | POA: Insufficient documentation

## 2022-05-03 DIAGNOSIS — N179 Acute kidney failure, unspecified: Secondary | ICD-10-CM | POA: Diagnosis not present

## 2022-05-03 DIAGNOSIS — Z8546 Personal history of malignant neoplasm of prostate: Secondary | ICD-10-CM | POA: Diagnosis not present

## 2022-05-03 DIAGNOSIS — R339 Retention of urine, unspecified: Secondary | ICD-10-CM | POA: Diagnosis present

## 2022-05-03 DIAGNOSIS — Z794 Long term (current) use of insulin: Secondary | ICD-10-CM | POA: Insufficient documentation

## 2022-05-03 DIAGNOSIS — R319 Hematuria, unspecified: Secondary | ICD-10-CM | POA: Insufficient documentation

## 2022-05-03 LAB — COMPREHENSIVE METABOLIC PANEL
ALT: 20 U/L (ref 0–44)
AST: 23 U/L (ref 15–41)
Albumin: 4.1 g/dL (ref 3.5–5.0)
Alkaline Phosphatase: 69 U/L (ref 38–126)
Anion gap: 12 (ref 5–15)
BUN: 30 mg/dL — ABNORMAL HIGH (ref 6–20)
CO2: 19 mmol/L — ABNORMAL LOW (ref 22–32)
Calcium: 9.8 mg/dL (ref 8.9–10.3)
Chloride: 103 mmol/L (ref 98–111)
Creatinine, Ser: 1.34 mg/dL — ABNORMAL HIGH (ref 0.61–1.24)
GFR, Estimated: >60 mL/min (ref >60)
Glucose, Bld: 221 mg/dL — ABNORMAL HIGH (ref 70–99)
Potassium: 3.8 mmol/L (ref 3.5–5.1)
Sodium: 134 mmol/L — ABNORMAL LOW (ref 135–145)
Total Bilirubin: 0.4 mg/dL (ref 0.3–1.2)
Total Protein: 7.2 g/dL (ref 6.5–8.1)

## 2022-05-03 LAB — CBC WITH DIFFERENTIAL/PLATELET
Abs Immature Granulocytes: 0.07 10*3/uL (ref 0.00–0.07)
Basophils Absolute: 0.1 10*3/uL (ref 0.0–0.1)
Basophils Relative: 1 %
Eosinophils Absolute: 0.2 10*3/uL (ref 0.0–0.5)
Eosinophils Relative: 2 %
HCT: 30.8 % — ABNORMAL LOW (ref 39.0–52.0)
Hemoglobin: 10.8 g/dL — ABNORMAL LOW (ref 13.0–17.0)
Immature Granulocytes: 1 %
Lymphocytes Relative: 3 %
Lymphs Abs: 0.4 10*3/uL — ABNORMAL LOW (ref 0.7–4.0)
MCH: 32 pg (ref 26.0–34.0)
MCHC: 35.1 g/dL (ref 30.0–36.0)
MCV: 91.1 fL (ref 80.0–100.0)
Monocytes Absolute: 1 10*3/uL (ref 0.1–1.0)
Monocytes Relative: 9 %
Neutro Abs: 9.3 10*3/uL — ABNORMAL HIGH (ref 1.7–7.7)
Neutrophils Relative %: 84 %
Platelets: 308 10*3/uL (ref 150–400)
RBC: 3.38 MIL/uL — ABNORMAL LOW (ref 4.22–5.81)
RDW: 13.7 % (ref 11.5–15.5)
WBC: 11 10*3/uL — ABNORMAL HIGH (ref 4.0–10.5)
nRBC: 0 % (ref 0.0–0.2)

## 2022-05-03 LAB — URINALYSIS, ROUTINE W REFLEX MICROSCOPIC
Bilirubin Urine: NEGATIVE
Glucose, UA: 100 mg/dL — AB
Ketones, ur: NEGATIVE mg/dL
Nitrite: POSITIVE — AB
Protein, ur: >300 mg/dL — AB
Specific Gravity, Urine: <1.005 — ABNORMAL LOW (ref 1.005–1.030)
pH: 7 (ref 5.0–8.0)

## 2022-05-03 LAB — URINALYSIS, MICROSCOPIC (REFLEX)
RBC / HPF: >50 RBC/hpf (ref 0–5)
Squamous Epithelial / HPF: NONE SEEN (ref 0–5)

## 2022-05-03 LAB — TYPE AND SCREEN
ABO/RH(D): O POS
Antibody Screen: NEGATIVE

## 2022-05-03 MED ORDER — SODIUM CHLORIDE 0.9 % IV SOLN
1.0000 g | Freq: Once | INTRAVENOUS | Status: AC
  Administered 2022-05-03: 1 g via INTRAVENOUS
  Filled 2022-05-03: qty 10

## 2022-05-03 MED ORDER — FENTANYL CITRATE PF 50 MCG/ML IJ SOSY
50.0000 ug | PREFILLED_SYRINGE | Freq: Once | INTRAMUSCULAR | Status: AC
  Administered 2022-05-03: 50 ug via INTRAVENOUS
  Filled 2022-05-03: qty 1

## 2022-05-03 MED ORDER — MORPHINE SULFATE (PF) 4 MG/ML IV SOLN
4.0000 mg | Freq: Once | INTRAVENOUS | Status: AC
  Administered 2022-05-03: 4 mg via INTRAVENOUS
  Filled 2022-05-03: qty 1

## 2022-05-03 MED ORDER — LIDOCAINE HCL URETHRAL/MUCOSAL 2 % EX GEL
1.0000 | Freq: Once | CUTANEOUS | Status: AC
  Administered 2022-05-03: 1 via URETHRAL
  Filled 2022-05-03: qty 11

## 2022-05-03 MED ORDER — SODIUM CHLORIDE 0.9 % IV BOLUS
500.0000 mL | Freq: Once | INTRAVENOUS | Status: AC
  Administered 2022-05-03: 500 mL via INTRAVENOUS

## 2022-05-03 NOTE — ED Provider Notes (Signed)
[facility] DEPT Provider Note   CSN: [id number] Arrival date & time: 05/03/22  0256     History  Chief Complaint  Patient presents with   Urinary Retention    KESLEY GAFFEY is a 56 y.o. male.  The history is provided by the patient, medical records and the spouse.  UEL DAVIDOW is a 56 y.o. male who presents to the Emergency Department complaining of urinary retention.  He presents to the emergency department accompanied by his wife for evaluation of urinary retention.  He has a history of prostate cancer status post radiation and has been experiencing recurrent bouts of hematuria.  He has been experiencing intermittent hematuria for the last few days and since 1 AM he has been unable to urinate due to having a large clot that he cannot pass.  He has associated abdominal pain.  No fevers, nausea.  Symptoms are severe and constant.     Home Medications Prior to Admission medications   Medication Sig Start Date End Date Taking? Authorizing Provider  ALPRAZolam (XANAX) 0.25 MG tablet Take 0.25 mg by mouth 3 (three) times daily as needed. 03/17/22   [provider]  amLODipine (NORVASC) 5 MG tablet Take 5 mg by mouth daily. 06/23/21   [provider]  Continuous Blood Gluc Sensor (FREESTYLE LIBRE 2 SENSOR) MISC USE TO MONITOR CBG EVERY 14 DAYS AS DIRECTED 06/11/21   [provider]  Insulin Pen Needle (B-D UF III MINI PEN NEEDLES) 31G X 5 MM MISC USE TO INJECT INSULIN 4 TIMES DAILY E10.9 01/22/16   [provider]  Insulin Syringe-Needle U-100 (B-D INS SYRINGE 0.5CC/31GX5/16) 31G X 5/16" 0.5 ML MISC use 1 syringe as directed (does 5 injections a day 02/04/10   [provider]  LYUMJEV TEMPO PEN 100 UNIT/ML SOPN Inject into the skin. 02/05/22   [provider]  ramipril (ALTACE) 10 MG capsule Take 10 mg by mouth daily.    [provider]  rosuvastatin (CRESTOR) 5 MG tablet Take 5 mg by mouth. Twice a  week    [provider]  TRESIBA FLEXTOUCH 200 UNIT/ML SOPN Inject 36 Units into the skin every morning.  07/25/15   [provider]  XTANDI 40 MG tablet Take 160 mg by mouth at bedtime. 03/04/22   [provider]      Allergies    Patient has no known allergies.    Review of Systems   Review of Systems  All other systems reviewed and are negative.   Physical Exam Updated Vital Signs BP (!) 183/80 (BP Location: Left Arm)   Pulse 97   Temp 98.5 F (36.9 C) (Oral)   Resp (!) 22   Ht '5\' 10"'$  (1.778 m)   Wt 99.8 kg   SpO2 98%   BMI 31.57 kg/m  Physical Exam Vitals and nursing note reviewed.  Constitutional:      General: He is in acute distress.     Appearance: He is well-developed.  HENT:     Head: Normocephalic and atraumatic.  Cardiovascular:     Rate and Rhythm: Regular rhythm. Tachycardia present.  Pulmonary:     Effort: Pulmonary effort is normal. No respiratory distress.  Abdominal:     General: There is distension.     Palpations: Abdomen is soft.     Tenderness: There is abdominal tenderness. There is no guarding or rebound.  Musculoskeletal:        General: No tenderness.  Skin:  General: Skin is warm and dry.  Neurological:     Mental Status: He is alert and oriented to person, place, and time.  Psychiatric:        Behavior: Behavior normal.     ED Results / Procedures / Treatments   Labs (all labs ordered are listed, but only abnormal results are displayed) Labs Reviewed  URINALYSIS, ROUTINE W REFLEX MICROSCOPIC - Abnormal; Notable for the following components:      Result Value   Color, Urine RED (*)    APPearance CLOUDY (*)    Specific Gravity, Urine <1.005 (*)    Glucose, UA 100 (*)    Hgb urine dipstick LARGE (*)    Protein, ur >300 (*)    Nitrite POSITIVE (*)    Leukocytes,Ua SMALL (*)    All other components within normal limits  COMPREHENSIVE METABOLIC PANEL - Abnormal; Notable for the following components:    Sodium 134 (*)    CO2 19 (*)    Glucose, Bld 221 (*)    BUN 30 (*)    Creatinine, Ser 1.34 (*)    All other components within normal limits  CBC WITH DIFFERENTIAL/PLATELET - Abnormal; Notable for the following components:   WBC 11.0 (*)    RBC 3.38 (*)    Hemoglobin 10.8 (*)    HCT 30.8 (*)    Neutro Abs 9.3 (*)    Lymphs Abs 0.4 (*)    All other components within normal limits  URINALYSIS, MICROSCOPIC (REFLEX) - Abnormal; Notable for the following components:   Bacteria, UA FEW (*)    All other components within normal limits  URINE CULTURE  LACTIC ACID, PLASMA  LACTIC ACID, PLASMA  TYPE AND SCREEN    EKG None  Radiology No results found.  Procedures Procedures    Medications Ordered in ED Medications  lidocaine (XYLOCAINE) 2 % jelly 1 Application (1 Application Urethral Given 05/03/22 0457)  fentaNYL (SUBLIMAZE) injection 50 mcg (50 mcg Intravenous Given 05/03/22 0438)  morphine (PF) 4 MG/ML injection 4 mg (4 mg Intravenous Given 05/03/22 0511)  sodium chloride 0.9 % bolus 500 mL (0 mLs Intravenous Stopped 05/03/22 0724)  cefTRIAXone (ROCEPHIN) 1 g in sodium chloride 0.9 % 100 mL IVPB (0 g Intravenous Stopped 05/03/22 0724)    ED Course/ Medical Decision Making/ A&P                           Medical Decision Making Amount and/or Complexity of Data Reviewed Labs: ordered.  Risk Prescription drug management.   Patient with history of prostate cancer status post radiation here for evaluation of inability to urinate, recent hematuria.  Patient in distress on ED presentation with tachycardia, abdominal pain.  Nursing staff and myself attempted to place Foley catheter, unable to pass past the meatus.  Discussed with Dr. Cain Sieve and with urology who came to the bedside and placed a catheter.  Patient was able to drain the urine and had relief of his symptoms.  He was treated with IV fluids for mild AKI.  CBC with improved hemoglobin since hospital discharge.  He was  treated with a one-time dose of antibiotics-we will send a culture.  He is to follow-up with urology tomorrow for reevaluation of the catheter.  Discussed return precautions for evidence of obstruction.  At time of discharge patient had dark pink urine in the catheter tubing, which was flowing without difficulty.  Current clinical picture is not consistent with hemorrhage,  sepsis.        Final Clinical Impression(s) / ED Diagnoses Final diagnoses:  Urinary retention  AKI (acute kidney injury) [facility])    Rx / [facility] Orders ED Discharge Orders     None         Quintella Reichert, MD 05/03/22 [id number]

## 2022-05-03 NOTE — Consult Note (Signed)
Urology Consult   Reason for consult: hematuria, clot retention, difficult foley  History of Present Illness: CHRISTOHER [name] is a 56 y.o. who presented to the ED earlier this morning c/o inability to void. He passed a few clots overnight and had increasing difficulty voiding. ED staff attempted foley placement and was unable to do so. POC ultrasound done which showed possibly a small amount of clot in a full bladder  At the time of my exam the patient is in visible discomfort  He had a similar issue last month and was taken to the OR for cysto with clot evac and fulguration.   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 He is currently undergoing hyperbaric O2 treatments.   Past Medical History:  Diagnosis Date   Asthma    Cancer ([place])    prostate cancer    Diabetes mellitus without complication ([place])    History of bronchitis    Hypertension    Prostate cancer ([place])    Wears glasses     Past Surgical History:  Procedure Laterality Date   CYSTOSCOPY WITH FULGERATION N/A 03/21/2022   Procedure: CYSTOSCOPY WITH FULGERATION;  Surgeon: Janith Lima, MD;  Location: [facility] ORS;  Service: Urology;  Laterality: N/A;   EYE SURGERY     laser eye surgery bilat    left groin hernia      LYMPHADENECTOMY Bilateral 09/02/2015   Procedure: BILATERAL LYMPHADENECTOMY;  Surgeon: Raynelle Bring, MD;  Location: [facility];  Service: Urology;  Laterality: Bilateral;   ROBOT ASSISTED LAPAROSCOPIC RADICAL PROSTATECTOMY N/A 09/02/2015   Procedure: XI ROBOTIC ASSISTED LAPAROSCOPIC RADICAL PROSTATECTOMY LEVEL 2;  Surgeon: Raynelle Bring, MD;  Location: [facility] OR[facility];  Service: Urology;  Laterality: N/A;   schwannoma     removed approx 16 to 18 years ago    [facility] Medications:  Home Meds:  No current facility-administered medications on file prior to encounter.   Current Outpatient Medications on File Prior to Encounter  Medication Sig Dispense Refill    ALPRAZolam (XANAX) 0.25 MG tablet Take 0.25 mg by mouth 3 (three) times daily as needed.     amLODipine (NORVASC) 5 MG tablet Take 5 mg by mouth daily.     Continuous Blood Gluc Sensor (FREESTYLE LIBRE 2 SENSOR) MISC USE TO MONITOR CBG EVERY 14 DAYS AS DIRECTED     Insulin Pen Needle (B-D UF III MINI PEN NEEDLES) 31G X 5 MM MISC USE TO INJECT INSULIN 4 TIMES DAILY E10.9     Insulin Syringe-Needle U-100 (B-D INS SYRINGE 0.5CC/31GX5/16) 31G X 5/16" 0.5 ML MISC use 1 syringe as directed (does 5 injections a day     LYUMJEV TEMPO PEN 100 UNIT/ML SOPN Inject into the skin.     ramipril (ALTACE) 10 MG capsule Take 10 mg by mouth daily.     rosuvastatin (CRESTOR) 5 MG tablet Take 5 mg by mouth. Twice a week     TRESIBA FLEXTOUCH 200 UNIT/ML SOPN Inject 36 Units into the skin every morning.      XTANDI 40 MG tablet Take 160 mg by mouth at bedtime.       Scheduled Meds:  lidocaine  1 Application Urethral Once   Continuous Infusions: PRN Meds:.  Allergies: No Known Allergies  History reviewed. No pertinent family history.  Social History:  reports that he quit smoking about 23 years ago. His smoking use included cigarettes. He has a 3.50 pack-year smoking history.  He has never used smokeless tobacco. He reports current alcohol use. He reports that he does not use drugs.  ROS: A complete review of systems was performed.  All systems are negative except for pertinent findings as noted.  Physical Exam:  Vital signs in last 24 hours: Temp:  [97.6 F (36.4 C)] 97.6 F (36.4 C) (11/19 0303) Pulse Rate:  [104] 104 (11/19 0303) Resp:  [18] 18 (11/19 0303) BP: (175)/(112) 175/112 (11/19 0303) SpO2:  [100 %] 100 % (11/19 0303) Weight:  [99.8 kg] 99.8 kg (11/19 0303) Constitutional:  Alert and oriented, in visible distress Cardiovascular: Regular rate and rhythm Respiratory: Normal respiratory effort, Lungs clear bilaterally GI: Abdomen is soft, nontender, nondistended, no abdominal masses GU:  No CVA tenderness Neurologic: Grossly intact, no focal deficits Psychiatric: Normal mood and affect  Laboratory Data:  No results for input(s): "WBC", "HGB", "HCT", "PLT" in the last 72 hours.  No results for input(s): "NA", "K", "CL", "GLUCOSE", "BUN", "CALCIUM", "CREATININE" in the last 72 hours.  Invalid input(s): "CO3"   No results found for this or any previous visit (from the past 24 hour(s)). No results found for this or any previous visit (from the past 240 hour(s)).  Renal Function: No results for input(s): "CREATININE" in the last 168 hours. CrCl cannot be calculated (Patient's most recent lab result is older than the maximum 21 days allowed.).  Radiologic Imaging: No results found.  I independently reviewed the above imaging studies.  PROCEDURE The patient was prepped and draped in the usual sterile fashion. There was some resistance just inside the meatus; I dilated this with a hemostat. Thereafter I was able to place a 24 fr 3 way catheter. The balloon was inflated with 10 cc. Initially the urine was merlot colored. I hand irrigated out ~75-100 cc of small, fibrinous appearing clots and the urine then drained light pink.  Impression/Recommendation 56 yo M with hx of radiation cystitis, here today with clot retention s/p difficult foley placement  --I feel it is safe to discharge the patient from the ED given the appearance of his urine. We reviewed warning s/s of clot retention --I advised him to go to clinic tomorrow. If the appearance of his urine is similar, we can remove the catheter. Given his hx of radiation cystitis, I feel it would be prudent to remove the catheter as soon as feasible.  Donald Pore MD 05/03/2022, 4:49 AM  [facility]  Pager: [phone]

## 2022-05-03 NOTE — ED Triage Notes (Signed)
Pt c/o suprapubic abdominal pain and urinary retention, pt states he hasn't voided normally since yesterday morning.

## 2022-05-04 ENCOUNTER — Encounter (HOSPITAL_BASED_OUTPATIENT_CLINIC_OR_DEPARTMENT_OTHER): Payer: BC Managed Care – PPO | Admitting: Internal Medicine

## 2022-05-04 LAB — URINE CULTURE: Culture: NO GROWTH

## 2022-05-04 NOTE — Progress Notes (Signed)
NASSIM, COSMA ([id number]) [id number] Page 1 of 2 Visit Report for 05/01/2022 Problem List Details Patient Name: Date of Service: Dillon Dun MES E. 05/01/2022 10:00 A M Medical Record Number: [id number] Patient Account Number: [id number] Date of Birth/Sex: Treating RN: [birth year] (56 y.o. 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 05/01/2022 Medical Record Number: [id number] Patient Account Number: [id number] Date of Birth/Sex: Treating RN: 10/18/1965 (56 y.o. 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 EVERARDO, VORIS ([id number]) [id number] Page 2 of 2 R31.9 Hematuria, unspecified C61 Malignant neoplasm of prostate E10.3529 Type 1 diabetes mellitus with proliferative diabetic  retinopathy with traction retinal detachment involving the macula, unspecified eye Facility Procedures : CPT4 Code: [id number] Description: G0277-(Facility Use Only) HBOT full body chamber, 19mn , ICD-10 Diagnosis Description 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 Hematuria, unspecified C61 Malignant neoplasm of prostate Modifier: Quantity: 4 Physician Procedures : CPT4 Code Description Modifier [id number] PHYS HYPERBARIC OXYGEN THERAPY ICD-10 Diagnosis Description 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 Hematuria, unspecified C61 Malignant neoplasm of prostate Quantity: 1 Electronic Signature(s) Signed: 05/01/2022 2:17:51 PM By: GValeria BatmanEMT Signed: 05/04/2022 11:34:26 AM By: HKalman ShanDO Entered By: GValeria Batmanon 05/01/2022 14:17:51

## 2022-05-05 ENCOUNTER — Encounter (HOSPITAL_BASED_OUTPATIENT_CLINIC_OR_DEPARTMENT_OTHER): Payer: BC Managed Care – PPO | Admitting: Internal Medicine

## 2022-05-05 ENCOUNTER — Encounter (HOSPITAL_BASED_OUTPATIENT_CLINIC_OR_DEPARTMENT_OTHER): Payer: Self-pay

## 2022-05-05 DIAGNOSIS — C61 Malignant neoplasm of prostate: Secondary | ICD-10-CM

## 2022-05-05 DIAGNOSIS — R319 Hematuria, unspecified: Secondary | ICD-10-CM

## 2022-05-05 DIAGNOSIS — N3041 Irradiation cystitis with hematuria: Secondary | ICD-10-CM

## 2022-05-05 LAB — GLUCOSE, CAPILLARY
Glucose-Capillary: 288 mg/dL — ABNORMAL HIGH (ref 70–99)
Glucose-Capillary: 246 mg/dL — ABNORMAL HIGH (ref 70–99)

## 2022-05-05 NOTE — Progress Notes (Signed)
KHYRIN, TREVATHAN ([id number]) [id number] Page 1 of 2 Visit Report for 05/05/2022 Arrival Information Details Patient Name: Date of Service: Dillon Dun MES E. 05/05/2022 11:00 A M Medical Record Number: [id number] Patient Account Number: [id number] Date of Birth/Sex: Treating RN: 1965/11/08 (56 y.o. [name], [name] Primary Care Shameeka Silliman: [name] Other Clinician: [name] Referring Brooks Kinnan: Treating Kazandra Forstrom/Extender: [name] in Treatment: 5 Visit Information History Since Last Visit All ordered tests and consults were completed: Yes Patient Arrived: Ambulatory Added or deleted any medications: No Arrival Time: 11:05 Any new allergies or adverse reactions: No Accompanied By: None Had a fall or experienced change in No Transfer Assistance: None activities of daily living that may affect Patient Identification Verified: Yes risk of falls: Secondary Verification Process Completed: Yes Signs or symptoms of abuse/neglect since last visito No Patient Requires Transmission-Based Precautions: No Hospitalized since last visit: No Patient Has Alerts: No Implantable device outside of the clinic excluding No cellular tissue based products placed in the center since last visit: Pain Present Now: No Electronic Signature(s) Signed: 05/05/2022 3:43:46 PM By: [name] EMT Entered By: [name] on 05/05/2022 15:43:46 -------------------------------------------------------------------------------- Encounter Discharge Information Details Patient Name: Date of Service: Dillon Dun MES E. 05/05/2022 11:00 A M Medical Record Number: [id number] Patient Account Number: [id number] Date of Birth/Sex: Treating RN: 18-Oct-1965 (56 y.o. [name], [name] Primary Care Brittin Janik: [name] Other Clinician: [name] Referring Brennin Durfee: Treating Dashon Mcintire/Extender: [name] in  Treatment: 5 Encounter Discharge Information Items Discharge Condition: Stable Ambulatory Status: Ambulatory Discharge Destination: Home Transportation: Private Auto Accompanied By: None Schedule Follow-up Appointment: Yes Clinical Summary of Care: Electronic Signature(s) Signed: 05/05/2022 3:59:41 PM By: [name] EMT Entered By: [name] on 05/05/2022 15:59:40 [name] ([id number]) [id number] Page 2 of 2 -------------------------------------------------------------------------------- Vitals Details Patient Name: Date of Service: Dillon Dun MES E. 05/05/2022 11:00 A M Medical Record Number: [id number] Patient Account Number: [id number] Date of Birth/Sex: Treating RN: 12/09/1965 (56 y.o. [name], [name] Primary Care Miki Labuda: [name] Other Clinician: [name] Referring Prince Olivier: Treating Kayde Atkerson/Extender: [name] in Treatment: 5 Vital Signs Time Taken: 11:20 Temperature (F): 98.1 Height (in): 70 Pulse (bpm): 99 Weight (lbs): 210 Respiratory Rate (breaths/min): 16 Body Mass Index (BMI): 30.1 Blood Pressure (mmHg): 140/70 Capillary Blood Glucose (mg/dl): 288 Reference Range: 80 - 120 mg / dl Electronic Signature(s) Signed: 05/05/2022 3:44:29 PM By: [name] EMT Entered By: [name] on 05/05/2022 15:44:28

## 2022-05-05 NOTE — Progress Notes (Signed)
KESHAUN, DUBEY ([id number]) [id number] Page 1 of 2 Visit Report for 05/05/2022 HBO Details Patient Name: Date of Service: [name] MES E. 05/05/2022 11:00 A M Medical Record Number: [id number] Patient Account Number: [id number] Date of Birth/Sex: Treating RN: 26-Dec-1965 (56 y.o. [name], [place] Primary Care Oseph Imburgia: [name] Other Clinician: [name] Referring Hersey Maclellan: Treating Sherene Plancarte/Extender: [name] in Treatment: 5 HBO Treatment Course Details Treatment Course Number: 1 Ordering Jalik Gellatly: [name] T Treatments Ordered: otal 40 HBO Treatment Start Date: 04/09/2022 HBO Indication: Late Effect of Radiation HBO Treatment Details Treatment Number: 18 Patient Type: Outpatient Chamber Type: Monoplace Chamber Serial #: [id number] Treatment Protocol: 2.0 ATA with 90 minutes oxygen, and no air breaks Treatment Details Compression Rate Down: 2.0 psi / minute De-Compression Rate Up: 2.0 psi / minute Air breaks and breathing Decompress Decompress Compress Tx Pressure Begins Reached periods Begins Ends (leave unused spaces blank) Chamber Pressure (ATA 1 2 ------2 1 ) Clock Time (24 hr) 11:31 11:41 - - - - - - 13:11 13:18 Treatment Length: 107 (minutes) Treatment Segments: 4 Vital Signs Capillary Blood Glucose Reference Range: 80 - 120 mg / dl HBO Diabetic Blood Glucose Intervention Range: <131 mg/dl or >249 mg/dl Time Vitals Blood Respiratory Capillary Blood Glucose Pulse Action Type: Pulse: Temperature: Taken: Pressure: Rate: Glucose (mg/dl): Meter #: Oximetry (%) Taken: Pre 11:20 140/70 99 16 98.1 288 Post 13:30 150/93 88 16 97.7 246 Treatment Response Treatment Toleration: Well Treatment Completion Status: Treatment Completed without Adverse Event Treatment Notes The patient first arrived at 0943. Stated that this morning that he had removed his bedside foley drain bag and saw some  clots in it. He also stated that he had not passed any urine after that . I spoke with Dr. Celine Ahr. Dr. Celine Ahr stated that he needed to go to [facility] to be seen. I called [facility] and spoke with the Triage RN. She stated to have him come down and be seen. I walked the patient to [facility]. The patient called later and stated that [facility] had flushed his foley and asked we could still do his treatment today. I stated yes. After his treatment the patient had 275cc of Dark Red urine with blood clots. No problems noted during the treatment. Electronic Signature(s) Signed: 05/05/2022 3:56:05 PM By: [name] EMT Signed: 05/06/2022 8:35:44 AM By: [name] DO Entered By: [name] on 05/05/2022 15:56:04 [name] ([id number]) [id number] Page 2 of 2 -------------------------------------------------------------------------------- HBO Safety Checklist Details Patient Name: Date of Service: [name] MES E. 05/05/2022 11:00 A M Medical Record Number: [id number] Patient Account Number: [id number] Date of Birth/Sex: Treating RN: 08/28/1965 (56 y.o. [name], [place] Primary Care Ayven Pheasant: [name] Other Clinician: [name] Referring Viera Okonski: Treating Ezelle Surprenant/Extender: [name] in Treatment: 5 [name] Safety Checklist Consent Form Signed Patient voided / foley secured and emptied When did you last eato 0730 Last dose of injectable or oral agent 0800 Ostomy pouch emptied and vented if applicable NA All implantable devices assessed, documented and approved foley cath to leg bag Intravenous access site secured and place NA Valuables secured Linens and cotton and cotton/polyester blend (less than 51% polyester) Personal oil-based products / skin lotions / body lotions removed Wigs or hairpieces removed NA Smoking or tobacco materials removed Books / newspapers /  magazines / loose paper removed Cologne, aftershave, perfume and deodorant removed Jewelry removed (may wrap wedding band) Make-up removed  NA Hair care products removed Battery operated devices (external) removed Heating patches and chemical warmers removed Titanium eyewear removed NA Nail polish cured greater than 10 hours NA Casting material cured greater than 10 hours NA Hearing aids removed NA Loose dentures or partials removed NA Prosthetics have been removed NA Patient demonstrates correct use of air break device (if applicable) Patient concerns have been addressed Patient grounding bracelet on and cord attached to chamber Specifics for Inpatients (complete in addition to above) Medication sheet sent with patient NA Intravenous medications needed or due during therapy sent with patient NA Drainage tubes (e.g. nasogastric tube or chest tube secured and vented) NA Endotracheal or Tracheotomy tube secured NA Cuff deflated of air and inflated with saline NA Airway suctioned NA Notes The safety check was done before the treatment was started. Electronic Signature(s) Signed: 05/05/2022 3:46:04 PM By: [name] EMT Entered By: [name] on 05/05/2022 15:46:03

## 2022-05-06 ENCOUNTER — Encounter (HOSPITAL_BASED_OUTPATIENT_CLINIC_OR_DEPARTMENT_OTHER): Payer: BC Managed Care – PPO | Admitting: General Surgery

## 2022-05-06 DIAGNOSIS — N3041 Irradiation cystitis with hematuria: Secondary | ICD-10-CM | POA: Diagnosis not present

## 2022-05-06 LAB — GLUCOSE, CAPILLARY
Glucose-Capillary: 240 mg/dL — ABNORMAL HIGH (ref 70–99)
Glucose-Capillary: 221 mg/dL — ABNORMAL HIGH (ref 70–99)

## 2022-05-06 NOTE — Progress Notes (Signed)
ALVAH, LAGROW ([id number]) [id number] Page 1 of 3 Visit Report for 05/06/2022 Arrival Information Details Patient Name: Date of Service: Dillon Dun MES E. 05/06/2022 10:00 A M Medical Record Number: [id number] Patient Account Number: [id number] Date of Birth/Sex: Treating RN: 1965-09-05 (56 y.o. [name], [name] Primary Care Shadow Stiggers: [name] Other Clinician: [name] Referring Yul Diana: Treating Terrel Nesheiwat/Extender: [name] in Treatment: 5 Visit Information History Since Last Visit All ordered tests and consults were completed: Yes Patient Arrived: Ambulatory Added or deleted any medications: No Arrival Time: 09:32 Any new allergies or adverse reactions: No Accompanied By: None Had a fall or experienced change in No Transfer Assistance: None activities of daily living that may affect Patient Identification Verified: Yes risk of falls: Secondary Verification Process Completed: Yes Signs or symptoms of abuse/neglect since last visito No Patient Requires Transmission-Based Precautions: No Hospitalized since last visit: No Patient Has Alerts: No Implantable device outside of the clinic excluding No cellular tissue based products placed in the center since last visit: Pain Present Now: Yes Electronic Signature(s) Signed: 05/06/2022 3:56:10 PM By: [name] EMT Previous Signature: 05/06/2022 2:02:56 PM Version By: [name] EMT Entered By: [name] on 05/06/2022 15:56:10 -------------------------------------------------------------------------------- Encounter Discharge Information Details Patient Name: Date of Service: Dillon Dun MES E. 05/06/2022 10:00 A M Medical Record Number: [id number] Patient Account Number: [id number] Date of Birth/Sex: Treating RN: [birth year] (56 y.o. [name] Primary Care Francis Yardley: [name] Other Clinician: [name] Referring  Silvana Holecek: Treating Isaiha Asare/Extender: [name] in Treatment: 5 Encounter Discharge Information Items Discharge Condition: Stable Ambulatory Status: Ambulatory Discharge Destination: Home Transportation: Private Auto Accompanied By: None Schedule Follow-up Appointment: Yes Clinical Summary of Care: Electronic Signature(s) Signed: 05/06/2022 2:07:23 PM By: [name] EMT Entered By: [name] on 05/06/2022 14:07:23 [name] ([id number]) [id number] Page 2 of 3 -------------------------------------------------------------------------------- Pain Assessment Details Patient Name: Date of Service: Dillon Dun MES E. 05/06/2022 10:00 A M Medical Record Number: [id number] Patient Account Number: [id number] Date of Birth/Sex: Treating RN: 1966-03-27 (56 y.o. [name] Primary Care Eryx Zane: [name] Other Clinician: [name] Referring Jonaven Hilgers: Treating Tal Neer/Extender: [name] in Treatment: 5 Active Problems Location of Pain Severity and Description of Pain Patient Has Paino Yes Site Locations Duration of the Pain. Constant / Intermittento Constant Rate the pain. Current Pain Level: 7 Character of Pain Describe the Pain: Burning Pain Management and Medication Current Pain Management: Medication: Yes Is the Current Pain Management Adequate: Adequate Notes reports pain in penis. Going to [facility] after HBOT today for evaluation. Electronic Signature(s) Signed: 05/06/2022 5:22:47 PM By: Baruch Gouty RN, BSN Entered By: Baruch Gouty on 05/06/2022 16:31:09 -------------------------------------------------------------------------------- Vitals Details Patient Name: Date of Service: Dillon Dun MES E. 05/06/2022 10:00 A M Medical Record Number: [id number] Patient Account Number: [id number] Date of Birth/Sex: Treating RN: 1965/09/05 (56 y.o. [name] Primary Care Nathanel Tallman: [name] Other Clinician: [name] Referring Safwan Tomei: Treating Malcolm Quast/Extender: [name] in Treatment: 5 Vital Signs Time Taken: 09:50 Capillary Blood Glucose (mg/dl): 240 Height (in): 70 Reference Range: 80 - 120 mg / dl Weight (lbs): 210 [name] ([id number]) [id number] Page 3 of 3 Body Mass Index (BMI): 30.1 Electronic Signature(s) Signed: 05/06/2022 2:03:24 PM By: [name] EMT Entered By: [name] on 05/06/2022 14:03:23

## 2022-05-06 NOTE — Progress Notes (Signed)
NICHOLAD, KAUTZMAN ([id number]) [id number] Page 1 of 2 Visit Report for 05/05/2022 Problem List Details Patient Name: Date of Service: Dillon Dun MES E. 05/05/2022 11:00 A M Medical Record Number: [id number] Patient Account Number: [id number] Date of Birth/Sex: Treating RN: 04-25-1966 (56 y.o. 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 05/05/2022 Medical Record Number: [id number] Patient Account Number: [id number] Date of Birth/Sex: Treating RN: 1965-11-20 (56 y.o. [name], [name] Primary Care Provider: [name] Other Clinician: [name] Referring Provider: Treating Provider/Extender: [name] in Treatment: 5 Diagnosis Coding ICD-10 Codes Code Description N30.41 Irradiation cystitis with hematuria [name], [name] ([id number]) [id number] Page 2 of 2 R31.9 Hematuria, unspecified C61 Malignant neoplasm of prostate E10.3529 Type 1 diabetes mellitus with proliferative diabetic  retinopathy with traction retinal detachment involving the macula, unspecified eye Facility Procedures : CPT4 Code: [id number] Description: G0277-(Facility Use Only) HBOT full body chamber, 75mn , ICD-10 Diagnosis Description 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 Hematuria, unspecified C61 Malignant neoplasm of prostate Modifier: Quantity: 4 Physician Procedures : CPT4 Code Description Modifier [id number]- WC PHYS HYPERBARIC OXYGEN THERAPY ICD-10 Diagnosis Description 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 Hematuria, unspecified C61 Malignant neoplasm of prostate Quantity: 1 Electronic Signature(s) Signed: 05/05/2022 3:58:39 PM By: GValeria BatmanEMT Signed: 05/06/2022 8:35:44 AM By: HKalman ShanDO Entered By: GValeria Batmanon 05/05/2022 15:58:39

## 2022-05-06 NOTE — Progress Notes (Signed)
Dillon, [name] ([id number]) [id number] Page 1 of 2 Visit Report for 05/06/2022 HBO Details Patient Name: Date of Service: [name]. 05/06/2022 10:00 A M Medical Record Number: [id number] Patient Account Number: [id number] Date of Birth/Sex: Treating RN: 1966/02/05 (56 y.o. [name] Primary Care Cheryl Stabenow: [name] Other Clinician: [name] Referring Tayjon Halladay: Treating Madolyn Ackroyd/Extender: [name] in Treatment: 5 HBO Treatment Course Details Treatment Course Number: 1 Ordering Prabhjot Maddux: [name] T Treatments Ordered: otal 40 HBO Treatment Start Date: 04/09/2022 HBO Indication: Late Effect of Radiation HBO Treatment Details Treatment Number: 19 Patient Type: Outpatient Chamber Type: Monoplace Chamber Serial #: [id number] Treatment Protocol: 2.0 ATA with 90 minutes oxygen, and no air breaks Treatment Details Compression Rate Down: 2.0 psi / minute De-Compression Rate Up: 2.0 psi / minute Air breaks and breathing Decompress Decompress Compress Tx Pressure Begins Reached periods Begins Ends (leave unused spaces blank) Chamber Pressure (ATA 1 2 ------2 1 ) Clock Time (24 hr) 10:22 10:31 - - - - - - 12:01 12:09 Treatment Length: 107 (minutes) Treatment Segments: 4 Vital Signs Capillary Blood Glucose Reference Range: 80 - 120 mg / dl HBO Diabetic Blood Glucose Intervention Range: <131 mg/dl or >249 mg/dl Time Vitals Blood Respiratory Capillary Blood Glucose Pulse Action Type: Pulse: Temperature: Taken: Pressure: Rate: Glucose (mg/dl): Meter #: Oximetry (%) Taken: Pre 09:50 240 Post 12:16 135/90 84 20 97.5 221 Pre 10:11 116/68 91 22 97.5 Treatment Response Treatment Toleration: Well Treatment Completion Status: Treatment Completed without Adverse Event Physician HBO Attestation: I certify that I supervised this HBO treatment in accordance with Medicare guidelines. A trained  emergency response team is readily available per Yes hospital policies and procedures. Continue HBOT as ordered. Yes Electronic Signature(s) Signed: 05/06/2022 5:06:04 PM By: [name] MD FACS Previous Signature: 05/06/2022 2:06:47 PM Version By: [name] EMT Previous Signature: 05/06/2022 2:05:35 PM Version By: [name] EMT Entered By: [name] on 05/06/2022 [name], [name] ([id number]) [id number] Page 2 of 2 -------------------------------------------------------------------------------- HBO Safety Checklist Details Patient Name: Date of Service: [name]. 05/06/2022 10:00 A M Medical Record Number: [id number] Patient Account Number: [id number] Date of Birth/Sex: Treating RN: [birth year] (56 y.o. [name] Primary Care Bobbette Eakes: [name] Other Clinician: [name] Referring Deni Berti: Treating Miran Kautzman/Extender: [name] in Treatment: 5 [name] Safety Checklist Consent Form Signed Patient voided / foley secured and emptied When did you last eato 0730 Last dose of injectable or oral agent 0800 Ostomy pouch emptied and vented if applicable NA All implantable devices assessed, documented and approved foley cath to leg bag Intravenous access site secured and place NA Valuables secured Linens and cotton and cotton/polyester blend (less than 51% polyester) Personal oil-based products / skin lotions / body lotions removed Wigs or hairpieces removed NA Smoking or tobacco materials removed Books / newspapers / magazines / loose paper removed Cologne, aftershave, perfume and deodorant removed Jewelry removed (may wrap wedding band) Make-up removed NA Hair care products removed Battery operated devices (external) removed Heating patches and chemical warmers removed Titanium eyewear removed NA Nail polish cured greater than 10 hours NA Casting  material cured greater than 10 hours NA Hearing aids removed NA Loose dentures or partials removed NA Prosthetics have been removed NA Patient demonstrates correct use of air break device (if applicable) Patient concerns have been addressed Patient grounding bracelet on and cord attached to chamber Specifics for Inpatients (complete in  addition to above) Medication sheet sent with patient NA Intravenous medications needed or due during therapy sent with patient NA Drainage tubes (Mooreg. nasogastric tube or chest tube secured and vented) NA Endotracheal or Tracheotomy tube secured NA Cuff deflated of air and inflated with saline NA Airway suctioned NA Notes The safety check was done before the treatment was started. Electronic Signature(s) Signed: 05/06/2022 2:04:31 PM By: [name] EMT Entered By: [name] on 05/06/2022 14:04:31

## 2022-05-07 NOTE — Progress Notes (Signed)
GRAFTON, WARZECHA ([id number]) [id number] Page 1 of 2 Visit Report for 05/06/2022 Problem List Details Patient Name: Date of Service: Dillon Dun MES E. 05/06/2022 10:00 A M Medical Record Number: [id number] Patient Account Number: [id number] Date of Birth/Sex: Treating RN: 1965-10-16 (56 y.o. 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 05/06/2022 Medical Record Number: [id number] Patient Account Number: [id number] Date of Birth/Sex: Treating RN: 02/14/1966 (56 y.o. [name] Primary Care Provider: [name] Other Clinician: [name] Referring Provider: Treating Provider/Extender: [name] in Treatment: 5 Diagnosis Coding ICD-10 Codes Code Description N30.41 Irradiation cystitis with hematuria [name], [name] ([id number]) [id number] Page 2 of 2 R31.9 Hematuria, unspecified C61 Malignant neoplasm of prostate E10.3529 Type 1 diabetes mellitus with proliferative diabetic  retinopathy with traction retinal detachment involving the macula, unspecified eye Facility Procedures : CPT4 Code: [id number] Description: G0277-(Facility Use Only) HBOT full body chamber, 76mn , ICD-10 Diagnosis Description 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 Hematuria, unspecified C61 Malignant neoplasm of prostate Modifier: Quantity: 4 Physician Procedures : CPT4 Code Description Modifier [id number]- WC PHYS HYPERBARIC OXYGEN THERAPY ICD-10 Diagnosis Description 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 Hematuria, unspecified C61 Malignant neoplasm of prostate Quantity: 1 Electronic Signature(s) Signed: 05/06/2022 2:05:57 PM By: GValeria BatmanEMT Signed: 05/06/2022 5:15:30 PM By: CFredirick MaudlinMD FACS Entered By: GValeria Batmanon 05/06/2022 14:05:57

## 2022-05-11 ENCOUNTER — Encounter (HOSPITAL_BASED_OUTPATIENT_CLINIC_OR_DEPARTMENT_OTHER): Payer: BC Managed Care – PPO | Admitting: Internal Medicine

## 2022-05-11 DIAGNOSIS — N3041 Irradiation cystitis with hematuria: Secondary | ICD-10-CM | POA: Diagnosis not present

## 2022-05-11 LAB — GLUCOSE, CAPILLARY
Glucose-Capillary: 187 mg/dL — ABNORMAL HIGH (ref 70–99)
Glucose-Capillary: 173 mg/dL — ABNORMAL HIGH (ref 70–99)

## 2022-05-11 NOTE — Progress Notes (Signed)
BISHOY, CUPP ([id number]) [id number] Page 1 of 3 Visit Report for 05/11/2022 HBO Details Patient Name: Date of Service: Dillon Dun MES E. 05/11/2022 10:00 A M Medical Record Number: [id number] Patient Account Number: [id number] Date of Birth/Sex: Treating RN: August 01, 1965 (56 y.o. [name] Primary Care Ily Denno: [name] Other Clinician: [name] Referring Abdelrahman Nair: Treating Rainelle Sulewski/Extender: [name] in Treatment: 5 HBO Treatment Course Details Treatment Course Number: 1 Ordering Anne-Marie Genson: [name] T Treatments Ordered: otal 40 HBO Treatment Start Date: 04/09/2022 HBO Indication: Late Effect of Radiation HBO Treatment Details Treatment Number: 20 Patient Type: Outpatient Chamber Type: Monoplace Chamber Serial #: [id number] Treatment Protocol: 2.0 ATA with 90 minutes oxygen, and no air breaks Treatment Details Compression Rate Down: 2.0 psi / minute De-Compression Rate Up: 2.0 psi / minute Air breaks and breathing Decompress Decompress Compress Tx Pressure Begins Reached periods Begins Ends (leave unused spaces blank) Chamber Pressure (ATA 1 2 ------2 1 ) Clock Time (24 hr) 10:21 10:30 - - - - - - 11:39 11:46 Treatment Length: 85 (minutes) Treatment Segments: 3 Vital Signs Capillary Blood Glucose Reference Range: 80 - 120 mg / dl HBO Diabetic Blood Glucose Intervention Range: <131 mg/dl or >249 mg/dl Time Vitals Blood Respiratory Capillary Blood Glucose Pulse Action Type: Pulse: Temperature: Taken: Pressure: Rate: Glucose (mg/dl): Meter #: Oximetry (%) Taken: Pre 10:12 152/74 98 16 97.4 187 Post 11:55 154/57 86 18 97.6 173 Treatment Response Treatment Toleration: Fair Treatment Completion Status: Treatment Aborted/Not Restarted Reason: Physician Coverage Treatment Notes At 4332 The patient stated that he was hot and did not feel well needed to come out. Dr. Heber Mathews called to the  chamber. Dr. Heber [place] arrived at 1136. Dr. Heber Hall spoke to the patient in the chamber. The patient stated that he had lower abdomen pain and felt flushed, clammy. Denied chest pain and shortness of breath. Dr. Heber Concepcion ordered to abort treatment and remove the patient from the chamber. Decompression rate at 2.0 PSI/min. with no problems noted. The patient had dark red urine in his leg drainage bag. The patient stated that he had an appointment with Dr. Alinda Money at [facility] after treatment today. I called Urology amd spoke with the triage nurse and gave report. The patient walked to his Urology appointment. Tatsuya Okray Notes Was called to the Chamber bedside. Patient complaining of abdominal discomfort and feeling diaphoretic. He was concerned that his foley catheter line was clogged. His urine collection bag had dark red urine. He has an appointment scheduled with urology after HBO treatment today. I recommended he follow-up with urology. He denies shortness of breath or chest pain. Physician HBO Attestation: I certify that I supervised this HBO treatment in accordance with Medicare guidelines. A trained emergency response team is readily available per Yes hospital policies and procedures. [name], [name] ([id number]) [id number] Page 2 of 3 Continue HBOT as ordered. Yes Electronic Signature(s) Signed: 05/12/2022 11:47:17 AM By: [name] DO Previous Signature: 05/11/2022 4:13:52 PM Version By: [name] EMT Previous Signature: 05/11/2022 4:12:32 PM Version By: [name] EMT Entered By: [name] on 05/12/2022 11:45:51 -------------------------------------------------------------------------------- HBO Safety Checklist Details Patient Name: Date of Service: Dillon Dun MES E. 05/11/2022 10:00 A M Medical Record Number: [id number] Patient Account Number: [id number] Date of Birth/Sex: Treating RN: [birth year] (56 y.o. [name] Primary Care  Crimson Beer: [name] Other Clinician: [name] Referring Keisuke Hollabaugh: Treating Sheyenne Konz/Extender: [name] in Treatment: 5 [name] Safety Checklist  Consent Form Signed Patient voided / foley secured and emptied When did you last eato 0830 Last dose of injectable or oral agent 0845 Ostomy pouch emptied and vented if applicable NA All implantable devices assessed, documented and approved foley cath to leg bag Intravenous access site secured and place NA Valuables secured Linens and cotton and cotton/polyester blend (less than 51% polyester) Personal oil-based products / skin lotions / body lotions removed Wigs or hairpieces removed NA Smoking or tobacco materials removed Books / newspapers / magazines / loose paper removed Cologne, aftershave, perfume and deodorant removed Jewelry removed (may wrap wedding band) Make-up removed NA Hair care products removed Battery operated devices (external) removed Heating patches and chemical warmers removed Titanium eyewear removed NA Nail polish cured greater than 10 hours NA Casting material cured greater than 10 hours NA Hearing aids removed NA Loose dentures or partials removed NA Prosthetics have been removed NA Patient demonstrates correct use of air break device (if applicable) Patient concerns have been addressed Patient grounding bracelet on and cord attached to chamber Specifics for Inpatients (complete in addition to above) Medication sheet sent with patient NA Intravenous medications needed or due during therapy sent with patient NA Drainage tubes (Mooreg. nasogastric tube or chest tube secured and vented) NA Endotracheal or Tracheotomy tube secured NA Cuff deflated of air and inflated with saline NA Airway suctioned NA Notes The safety check was done before the treatment was started. Electronic Signature(s) Signed: 05/11/2022 4:02:37 PM By: [name]  EMT Entered By: [name] on 05/11/2022 16:02:37 [name] ([id number]) [id number] Page 3 of 3

## 2022-05-11 NOTE — Progress Notes (Signed)
VAIDEN, ADAMES ([id number]) [id number] Page 1 of 2 Visit Report for 05/11/2022 Arrival Information Details Patient Name: Date of Service: [name] MES E. 05/11/2022 10:00 A M Medical Record Number: [id number] Patient Account Number: [id number] Date of Birth/Sex: Treating RN: Oct 19, 1965 (56 y.o. [name], Meta.Reding Primary Care [name]: [name] Other Clinician: [name] Referring [name]: Treating [name] in Treatment: 5 Visit Information History Since Last Visit All ordered tests and consults were completed: Yes Patient Arrived: Ambulatory Added or deleted any medications: No Arrival Time: 09:35 Any new allergies or adverse reactions: No Accompanied By: None Had a fall or experienced change in No Transfer Assistance: None activities of daily living that may affect Patient Identification Verified: Yes risk of falls: Secondary Verification Process Completed: Yes Signs or symptoms of abuse/neglect since last visito No Patient Requires Transmission-Based Precautions: No Hospitalized since last visit: No Patient Has Alerts: No Implantable device outside of the clinic excluding No cellular tissue based products placed in the center since last visit: Pain Present Now: No Electronic Signature(s) Signed: 05/11/2022 4:00:56 PM By: [name] EMT Entered By: [name] on 05/11/2022 16:00:56 -------------------------------------------------------------------------------- Encounter Discharge Information Details Patient Name: Date of Service: [name] MES E. 05/11/2022 10:00 A M Medical Record Number: [id number] Patient Account Number: [id number] Date of Birth/Sex: Treating RN: [birth year] (56 y.o. [name] Primary Care [name]: [name] Other Clinician: [name] Referring Breezy Hertenstein: Treating Latrica [name]/Extender: [name] in Treatment:  5 Encounter Discharge Information Items Discharge Condition: Stable Ambulatory Status: Ambulatory Discharge Destination: Other (Note Required) Telephoned: Yes Spoke With: Triage Nurse at [name] Transportation: Private Auto Accompanied By: None Schedule Follow-up Appointment: Yes Clinical Summary of Care: Notes Patient sent to [name]. Electronic Signature(s) Signed: 05/11/2022 4:16:09 PM By: [name] EMT Entered By: [name] on 05/11/2022 16:16:09 [name] ([id number]) [id number] Page 2 of 2 -------------------------------------------------------------------------------- Vitals Details Patient Name: Date of Service: [name] MES E. 05/11/2022 10:00 A M Medical Record Number: [id number] Patient Account Number: [id number] Date of Birth/Sex: Treating RN: [birth year] (56 y.o. [name] Primary Care [name]: [name] Other Clinician: [name] Referring [name]: Treating [name]/Extender: [name] in Treatment: 5 Vital Signs Time Taken: 10:12 Temperature (F): 97.4 Height (in): 70 Pulse (bpm): 98 Weight (lbs): 210 Respiratory Rate (breaths/min): 16 Body Mass Index (BMI): 30.1 Blood Pressure (mmHg): 152/74 Capillary Blood Glucose (mg/dl): 187 Reference Range: 80 - 120 mg / dl Electronic Signature(s) Signed: 05/11/2022 4:01:33 PM By: [name] EMT Entered By: [name] on 05/11/2022 16:01:32

## 2022-05-12 ENCOUNTER — Encounter (HOSPITAL_BASED_OUTPATIENT_CLINIC_OR_DEPARTMENT_OTHER): Payer: BC Managed Care – PPO | Admitting: Internal Medicine

## 2022-05-12 DIAGNOSIS — E103529 Type 1 diabetes mellitus with proliferative diabetic retinopathy with traction retinal detachment involving the macula, unspecified eye: Secondary | ICD-10-CM

## 2022-05-12 DIAGNOSIS — C61 Malignant neoplasm of prostate: Secondary | ICD-10-CM | POA: Diagnosis not present

## 2022-05-12 DIAGNOSIS — N3041 Irradiation cystitis with hematuria: Secondary | ICD-10-CM

## 2022-05-12 DIAGNOSIS — R319 Hematuria, unspecified: Secondary | ICD-10-CM

## 2022-05-12 LAB — GLUCOSE, CAPILLARY
Glucose-Capillary: 167 mg/dL — ABNORMAL HIGH (ref 70–99)
Glucose-Capillary: 143 mg/dL — ABNORMAL HIGH (ref 70–99)

## 2022-05-12 NOTE — Progress Notes (Signed)
ATLEY, NEUBERT ([id number]) [id number] Page 1 of 1 Visit Report for 05/12/2022 Arrival Information Details Patient Name: Date of Service: Dillon Dun MES E. 05/12/2022 10:00 A M Medical Record Number: [id number] Patient Account Number: [id number] Date of Birth/Sex: Treating RN: [birth year] (56 y.o. [name] Primary Care Seward Coran: [name] Other Clinician: [name] Referring Toniyah Dilmore: Treating Mamoudou Mulvehill/Extender: [name] in Treatment: 6 Visit Information History Since Last Visit All ordered tests and consults were completed: Yes Patient Arrived: Ambulatory Added or deleted any medications: No Arrival Time: 09:40 Any new allergies or adverse reactions: No Accompanied By: None Had a fall or experienced change in No Transfer Assistance: None activities of daily living that may affect Patient Identification Verified: Yes risk of falls: Secondary Verification Process Completed: Yes Signs or symptoms of abuse/neglect since last visito No Patient Requires Transmission-Based Precautions: No Hospitalized since last visit: No Patient Has Alerts: No Implantable device outside of the clinic excluding No cellular tissue based products placed in the center since last visit: Pain Present Now: No Electronic Signature(s) Signed: 05/12/2022 2:24:52 PM By: [name] EMT Entered By: [name] on 05/12/2022 14:24:52 -------------------------------------------------------------------------------- Vitals Details Patient Name: Date of Service: Dillon Dun MES E. 05/12/2022 10:00 A M Medical Record Number: [id number] Patient Account Number: [id number] Date of Birth/Sex: Treating RN: 02-13-1966 (56 y.o. [name] Primary Care Shykeem Resurreccion: [name] Other Clinician: [name] Referring Christhoper Busbee: Treating Sianne Tejada/Extender: [name] in Treatment: 6 Vital Signs Time  Taken: 10:07 Temperature (F): 98.6 Height (in): 70 Pulse (bpm): 90 Weight (lbs): 210 Respiratory Rate (breaths/min): 18 Body Mass Index (BMI): 30.1 Blood Pressure (mmHg): 111/85 Capillary Blood Glucose (mg/dl): 167 Reference Range: 80 - 120 mg / dl Electronic Signature(s) Signed: 05/12/2022 2:25:19 PM By: [name] EMT Entered By: [name] on 05/12/2022 14:25:18

## 2022-05-12 NOTE — Progress Notes (Signed)
BARTHOLOMEW, RAMESH ([id number]) [id number] Page 1 of 2 Visit Report for 05/11/2022 Problem List Details Patient Name: Date of Service: Dillon Dun MES E. 05/11/2022 10:00 A M Medical Record Number: [id number] Patient Account Number: [id number] Date of Birth/Sex: Treating RN: [birth year] (56 y.o. 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 05/11/2022 Medical Record Number: [id number] Patient Account Number: [id number] Date of Birth/Sex: Treating RN: Sep 29, 1965 (56 y.o. [name] Primary Care Provider: [name] Other Clinician: [name] Referring Provider: Treating Provider/Extender: [name] in Treatment: 5 Diagnosis Coding ICD-10 Codes Code Description N30.41 Irradiation cystitis with hematuria [name], [name] ([id number]) [id number] Page 2 of 2 R31.9 Hematuria, unspecified C61 Malignant neoplasm of prostate E10.3529 Type 1 diabetes mellitus with proliferative diabetic retinopathy  with traction retinal detachment involving the macula, unspecified eye Facility Procedures : CPT4 Code: 86761950 Description: G0277-(Facility Use Only) HBOT full body chamber, 54mn , ICD-10 Diagnosis Description 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 Hematuria, unspecified C61 Malignant neoplasm of prostate Modifier: Quantity: 3 Electronic Signature(s) Signed: 05/11/2022 4:12:50 PM By: GValeria BatmanEMT Signed: 05/12/2022 11:47:17 AM By: HKalman ShanDO Entered By: GValeria Batmanon 05/11/2022 16:12:50

## 2022-05-12 NOTE — Progress Notes (Addendum)
Dillon, [name] ([id number]) [id number] Page 1 of 2 Visit Report for 05/12/2022 HBO Details Patient Name: Date of Service: Dillon Dun MES E. 05/12/2022 10:00 A M Medical Record Number: [id number] Patient Account Number: [id number] Date of Birth/Sex: Treating RN: Jun 09, 1966 (56 y.o. Mare Ferrari Primary Care Mohsen Odenthal: [name] Other Clinician: [name] Referring Neville Pauls: Treating Klye Besecker/Extender: [name] in Treatment: 6 HBO Treatment Course Details Treatment Course Number: 1 Ordering Jonnatan Hanners: [name] T Treatments Ordered: otal 40 HBO Treatment Start Date: 04/09/2022 HBO Indication: Late Effect of Radiation HBO Treatment Details Treatment Number: 21 Patient Type: Outpatient Chamber Type: Monoplace Chamber Serial #: [id number] Treatment Protocol: 2.0 ATA with 90 minutes oxygen, and no air breaks Treatment Details Compression Rate Down: 2.0 psi / minute De-Compression Rate Up: 2.0 psi / minute Air breaks and breathing Decompress Decompress Compress Tx Pressure Begins Reached periods Begins Ends (leave unused spaces blank) Chamber Pressure (ATA 1 2 ------2 1 ) Clock Time (24 hr) 10:21 10:29 - - - - - - 11:59 12:07 Treatment Length: 106 (minutes) Treatment Segments: 4 Vital Signs Capillary Blood Glucose Reference Range: 80 - 120 mg / dl HBO Diabetic Blood Glucose Intervention Range: <131 mg/dl or >249 mg/dl Time Vitals Blood Respiratory Capillary Blood Glucose Pulse Action Type: Pulse: Temperature: Taken: Pressure: Rate: Glucose (mg/dl): Meter #: Oximetry (%) Taken: Pre 10:07 111/85 90 18 98.6 167 Post 12:15 130/80 82 18 97.4 143 Treatment Response Treatment Toleration: Well Treatment Completion Status: Treatment Completed without Adverse Event Treatment Notes No problems noted today. Electronic Signature(s) Signed: 05/12/2022 2:28:11 PM By: [name] EMT Signed: 05/12/2022  4:03:55 PM By: [name] DO Entered By: [name] on 05/12/2022 14:28:11 [name] ([id number]) [id number] Page 2 of 2 -------------------------------------------------------------------------------- HBO Safety Checklist Details Patient Name: Date of Service: Dillon Dun MES E. 05/12/2022 10:00 A M Medical Record Number: [id number] Patient Account Number: [id number] Date of Birth/Sex: Treating RN: 09/15/1965 (56 y.o. Mare Ferrari Primary Care Capria Cartaya: [name] Other Clinician: [name] Referring Dnaiel Voller: Treating Deandrea Rion/Extender: [name] in Treatment: 6 [name] Safety Checklist Consent Form Signed Patient voided / foley secured and emptied When did you last eato 0730 Last dose of injectable or oral agent 0745 Ostomy pouch emptied and vented if applicable NA All implantable devices assessed, documented and approved foley cath to bedside drain bag Intravenous access site secured and place NA Valuables secured Linens and cotton and cotton/polyester blend (less than 51% polyester) Personal oil-based products / skin lotions / body lotions removed Wigs or hairpieces removed NA Smoking or tobacco materials removed Books / newspapers / magazines / loose paper removed Cologne, aftershave, perfume and deodorant removed Jewelry removed (may wrap wedding band) Make-up removed NA Hair care products removed Battery operated devices (external) removed Heating patches and chemical warmers removed Titanium eyewear removed NA Nail polish cured greater than 10 hours NA Casting material cured greater than 10 hours NA Hearing aids removed NA Loose dentures or partials removed NA Prosthetics have been removed NA Patient demonstrates correct use of air break device (if applicable) Patient concerns have been addressed Patient grounding bracelet on and cord attached to  chamber Specifics for Inpatients (complete in addition to above) Medication sheet sent with patient NA Intravenous medications needed or due during therapy sent with patient NA Drainage tubes (Mooreg. nasogastric tube or chest tube secured and vented) NA Endotracheal or Tracheotomy tube secured NA Cuff deflated of air and  inflated with saline NA Airway suctioned NA Notes : The safety check was done before the treatment was started. Electronic Signature(s) Signed: 05/12/2022 2:26:48 PM By: [name] EMT Entered By: [name] on 05/12/2022 14:26:48

## 2022-05-13 ENCOUNTER — Encounter (HOSPITAL_BASED_OUTPATIENT_CLINIC_OR_DEPARTMENT_OTHER): Payer: BC Managed Care – PPO | Admitting: General Surgery

## 2022-05-13 DIAGNOSIS — N3041 Irradiation cystitis with hematuria: Secondary | ICD-10-CM | POA: Diagnosis not present

## 2022-05-13 LAB — GLUCOSE, CAPILLARY
Glucose-Capillary: 145 mg/dL — ABNORMAL HIGH (ref 70–99)
Glucose-Capillary: 129 mg/dL — ABNORMAL HIGH (ref 70–99)

## 2022-05-13 NOTE — Progress Notes (Signed)
BRETTON, TANDY ([id number]) [id number] Page 1 of 1 Visit Report for 05/13/2022 SuperBill Details Patient Name: Date of Service: [name] MES E. 05/13/2022 Medical Record Number: [id number] Patient Account Number: [id number] Date of Birth/Sex: Treating RN: [birth year] (56 y.o. 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, E10.3529 unspecified eye Facility Procedures CPT4 Code Description Modifier Quantity [id number] G0277-(Facility Use Only) HBOT full body chamber, 36mn , 4 ICD-10 Diagnosis Description 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 Hematuria, unspecified C61 Malignant neoplasm of prostate Physician Procedures Quantity CPT4 Code Description Modifier [id number] PHYS HYPERBARIC OXYGEN THERAPY 1 ICD-10 Diagnosis Description 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 Hematuria, unspecified C61 Malignant neoplasm of prostate Electronic Signature(s) Signed: 05/13/2022 1:51:29 PM By: GValeria BatmanEMT Signed: 05/13/2022 4:27:34 PM By: CFredirick MaudlinMD FACS Entered By: GValeria Batmanon 05/13/2022 13:51:29

## 2022-05-13 NOTE — Progress Notes (Addendum)
WYAT, INFINGER ([id number]) [id number] Page 1 of 2 Visit Report for 05/13/2022 HBO Details Patient Name: Date of Service: [name]. 05/13/2022 10:00 A M Medical Record Number: [id number] Patient Account Number: [id number] Date of Birth/Sex: Treating RN: 1966-03-18 (56 y.o. [name] Primary Care Chantella Creech: [name] Other Clinician: [name] Referring Chastidy Ranker: Treating Daritza Brees/Extender: [name] in Treatment: 6 HBO Treatment Course Details Treatment Course Number: 1 Ordering Kmari Halter: [name] T Treatments Ordered: otal 40 HBO Treatment Start Date: 04/09/2022 HBO Indication: Late Effect of Radiation HBO Treatment Details Treatment Number: 22 Patient Type: Outpatient Chamber Type: Monoplace Chamber Serial #: [id number] Treatment Protocol: 2.0 ATA with 90 minutes oxygen, and no air breaks Treatment Details Compression Rate Down: 2.0 psi / minute De-Compression Rate Up: 2.0 psi / minute Air breaks and breathing Decompress Decompress Compress Tx Pressure Begins Reached periods Begins Ends (leave unused spaces blank) Chamber Pressure (ATA 1 2 ------2 1 ) Clock Time (24 hr) 10:24 10:32 - - - - - - 12:03 12:10 Treatment Length: 106 (minutes) Treatment Segments: 4 Vital Signs Capillary Blood Glucose Reference Range: 80 - 120 mg / dl HBO Diabetic Blood Glucose Intervention Range: <131 mg/dl or >249 mg/dl Time Vitals Blood Respiratory Capillary Blood Glucose Pulse Action Type: Pulse: Temperature: Taken: Pressure: Rate: Glucose (mg/dl): Meter #: Oximetry (%) Taken: Pre 10:06 120/63 90 18 97.2 145 Post 12:16 125/81 78 16 97.4 129 Treatment Response Treatment Toleration: Well Treatment Completion Status: Treatment Completed without Adverse Event Physician HBO Attestation: I certify that I supervised this HBO treatment in accordance with Medicare guidelines. A trained emergency response  team is readily available per Yes hospital policies and procedures. Continue HBOT as ordered. Yes Electronic Signature(s) Signed: 05/13/2022 4:28:34 PM By: [name] MD FACS Previous Signature: 05/13/2022 1:51:04 PM Version By: [name] EMT Entered By: [name] on 05/13/2022 16:28:33 [name] ([id number]) [id number] Page 2 of 2 -------------------------------------------------------------------------------- HBO Safety Checklist Details Patient Name: Date of Service: [name]. 05/13/2022 10:00 A M Medical Record Number: [id number] Patient Account Number: [id number] Date of Birth/Sex: Treating RN: [birth year] (56 y.o. [name] Primary Care Demitris Pokorny: [name] Other Clinician: [name] Referring Lovie Zarling: Treating Annalysa Mohammad/Extender: [name] in Treatment: 6 [name] Safety Checklist Consent Form Signed Patient voided / foley secured and emptied When did you last eato 0730 Last dose of injectable or oral agent 0800 Ostomy pouch emptied and vented if applicable NA All implantable devices assessed, documented and approved foley cath to bedside drain bag Intravenous access site secured and place NA Valuables secured Linens and cotton and cotton/polyester blend (less than 51% polyester) Personal oil-based products / skin lotions / body lotions removed Wigs or hairpieces removed NA Smoking or tobacco materials removed Books / newspapers / magazines / loose paper removed Cologne, aftershave, perfume and deodorant removed Jewelry removed (may wrap wedding band) Make-up removed NA Hair care products removed Battery operated devices (external) removed Heating patches and chemical warmers removed Titanium eyewear removed NA Nail polish cured greater than 10 hours NA Casting material cured greater than 10 hours NA Hearing aids removed NA Loose dentures or  partials removed NA Prosthetics have been removed NA Patient demonstrates correct use of air break device (if applicable) Patient concerns have been addressed Patient grounding bracelet on and cord attached to chamber Specifics for Inpatients (complete in addition to above) Medication sheet sent with patient NA Intravenous medications  needed or due during therapy sent with patient NA Drainage tubes ([name].g. nasogastric tube or chest tube secured and vented) NA Endotracheal or Tracheotomy tube secured NA Cuff deflated of air and inflated with saline NA Airway suctioned NA Notes The safety check was done before the treatment was started. Electronic Signature(s) Signed: 05/13/2022 1:49:32 PM By: [name] EMT Entered By: [name] on 05/13/2022 13:49:31

## 2022-05-13 NOTE — Progress Notes (Signed)
BORUCH, MANUELE ([id number]) [phone].pdf Page 1 of 1 Visit Report for 05/13/2022 Arrival Information Details Patient Name: Date of Service: Dillon Dun MES E. 05/13/2022 10:00 A M Medical Record Number: [id number] Patient Account Number: [id number] Date of Birth/Sex: Treating RN: 02/23/1966 (56 y.o. [name] Primary Care Vika Buske: [name] Other Clinician: [name] Referring Fishel Wamble: Treating Seena Ritacco/Extender: [name] in Treatment: 6 Visit Information History Since Last Visit All ordered tests and consults were completed: Yes Patient Arrived: Ambulatory Added or deleted any medications: No Arrival Time: 09:43 Any new allergies or adverse reactions: No Accompanied By: None Had a fall or experienced change in No Transfer Assistance: None activities of daily living that may affect Patient Identification Verified: Yes risk of falls: Secondary Verification Process Completed: Yes Signs or symptoms of abuse/neglect since last visito No Patient Requires Transmission-Based Precautions: No Hospitalized since last visit: No Patient Has Alerts: No Implantable device outside of the clinic excluding No cellular tissue based products placed in the center since last visit: Pain Present Now: No Electronic Signature(s) Signed: 05/13/2022 1:47:45 PM By: [name] EMT Entered By: [name] on 05/13/2022 13:47:44 -------------------------------------------------------------------------------- Vitals Details Patient Name: Date of Service: Dillon Dun MES E. 05/13/2022 10:00 A M Medical Record Number: [id number] Patient Account Number: [id number] Date of Birth/Sex: Treating RN: 1966-04-14 (55 y.o. [name] Primary Care Aadon Gorelik: [name] Other Clinician: [name] Referring Davian Hanshaw: Treating Travonne Schowalter/Extender: [name] in Treatment: 6 Vital Signs Time  Taken: 10:06 Temperature (F): 97.2 Height (in): 70 Pulse (bpm): 90 Weight (lbs): 210 Respiratory Rate (breaths/min): 18 Body Mass Index (BMI): 30.1 Blood Pressure (mmHg): 120/63 Capillary Blood Glucose (mg/dl): 145 Reference Range: 80 - 120 mg / dl Electronic Signature(s) Signed: 05/13/2022 1:48:21 PM By: [name] EMT Entered By: [name] on 05/13/2022 13:48:21

## 2022-05-14 ENCOUNTER — Encounter (HOSPITAL_BASED_OUTPATIENT_CLINIC_OR_DEPARTMENT_OTHER): Payer: BC Managed Care – PPO | Admitting: General Surgery

## 2022-05-15 ENCOUNTER — Encounter (HOSPITAL_BASED_OUTPATIENT_CLINIC_OR_DEPARTMENT_OTHER): Payer: BC Managed Care – PPO | Admitting: Internal Medicine

## 2022-05-15 ENCOUNTER — Encounter (HOSPITAL_BASED_OUTPATIENT_CLINIC_OR_DEPARTMENT_OTHER): Payer: BC Managed Care – PPO | Attending: General Surgery | Admitting: General Surgery

## 2022-05-15 DIAGNOSIS — E103529 Type 1 diabetes mellitus with proliferative diabetic retinopathy with traction retinal detachment involving the macula, unspecified eye: Secondary | ICD-10-CM | POA: Insufficient documentation

## 2022-05-15 DIAGNOSIS — C61 Malignant neoplasm of prostate: Secondary | ICD-10-CM | POA: Diagnosis not present

## 2022-05-15 DIAGNOSIS — R319 Hematuria, unspecified: Secondary | ICD-10-CM | POA: Insufficient documentation

## 2022-05-15 DIAGNOSIS — N3041 Irradiation cystitis with hematuria: Secondary | ICD-10-CM | POA: Insufficient documentation

## 2022-05-15 LAB — GLUCOSE, CAPILLARY: Glucose-Capillary: 272 mg/dL — ABNORMAL HIGH (ref 70–99)

## 2022-05-15 NOTE — Progress Notes (Signed)
SIDDARTH, HSIUNG ([id number]) [id number] Page 1 of 7 Visit Report for 05/15/2022 Chief Complaint Document Details Patient Name: Date of Service: [name] MES E. 05/15/2022 8:45 A M Medical Record Number: [id number] Patient Account Number: [id number] Date of Birth/Sex: Treating RN: [birth year] (56 y.o. M) Primary Care Provider: [name] Other Clinician: Referring Provider: Treating Provider/Extender: [name] in Treatment: 6 Information Obtained from: Patient Chief Complaint Patient presents to the [facility]ound Care center for HBO eval due to radiation cystitis with hematuria Electronic Signature(s) Signed: 05/15/2022 9:06:55 AM By: [name] MD FACS Entered By: [name] on 05/15/2022 09:06:55 -------------------------------------------------------------------------------- HPI Details Patient Name: Date of Service: [name] MES E. 05/15/2022 8:45 A M Medical Record Number: [id number] Patient Account Number: [id number] Date of Birth/Sex: Treating RN: Apr 23, 1966 (56 y.o. M) Primary Care Provider: [name] Other Clinician: Referring Provider: Treating Provider/Extender: [name] in Treatment: 6 History of Present Illness HPI Description: ADMISSION 03/31/2022 This is a 56 year old male with a past medical history significant for type 1 diabetes mellitus and prostate cancer. He underwent a robotic prostatectomy with retroperitoneal lymph node dissection in 2017. He developed recurrence based upon biochemical data in 2019 and received salvage radiation therapy from October to December of that year. He received 45 Gy in 25 fractions of 1.8 Gy followed by a boost to the prostate bed for a total dose of 68.4 Gy. In February 2023, He developed oligometastatic recurrence to multiple lymph nodes and to bone. As result, he underwent additional radiation therapy including SBRT to the right  eighth rib and ultra hypofractionated radiotherapy to the periaortic and presacral lymph nodes. He received 50 Gy in 5 fractions to the right eighth rib and 50 Gy in 10 fractions to the periaortic presacral lymph nodes while delivering 30 Gy in 10 fractions to the involved echelons. After this treatment, he was placed on antihormonal therapy. He began experiencing intermittent gross hematuria with passage of dark clots in May 2023. In September 2023, he presented to the emergency department with urinary retention and frank blood from his penis. A Foley catheter was placed but this obstructed with blood. It was flushed and replaced. He subsequently was admitted to the hospital on March 20, 2022 after he removed his Foley catheter at home, feeling like it was clogged. He was experiencing daily hematuria with clots and became obstructed once again. A CT scan performed showed moderate intraluminal hemorrhage in the bladder. He was taken to the operating room the next day by urology who performed cystoscopy and clot evacuation with fulguration. The intraoperative findings included moderate clot burden, cystitis changes of the bladder mucosa and active bleeding from the trigone and posterior bladder wall. He also was anemic with a hemoglobin down to 6.5 g/dL on October 8, with a baseline normal hemoglobin at 12.9. He required transfusion to resolve this. 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 05/21/2022: 3-day HBO update. He has been tolerating his hyperbaric oxygen therapy without difficulty. Unfortunately, he continues to suffer with episodic hematuria and urinary retention secondary to clot burden. He has required multiple catheter-based interventions and upsizing of an indwelling Foley catheter to the point of a 62 [place], which was just removed yesterday. He is currently taking Pyridium to help with bladder spasms. Electronic  Signature(s) Signed: 05/15/2022 9:08:40 AM By: [name] MD FACS Entered By: [name] on 05/15/2022 09:08:40 [name] ([id number]) [id number]  Page 2 of 7 -------------------------------------------------------------------------------- Physical Exam Details Patient Name: Date of Service: [name] MES E. 05/15/2022 8:45 A M Medical Record Number: [id number] Patient Account Number: [id number] Date of Birth/Sex: Treating RN: 10/14/1965 (56 y.o. M) Primary Care Provider: [name] Other Clinician: Referring Provider: Treating Provider/Extender: [name] in Treatment: 6 Constitutional He is hypertensive, but asymptomatic.. . . . No acute distress. Ears, Nose, Mouth, and Throat . Respiratory Normal work of breathing on room air. Clear to auscultation bilaterally. No wheezes, rales, or rhonchi.. Cardiovascular . Electronic Signature(s) Signed: 05/15/2022 9:09:14 AM By: [name] MD FACS Entered By: [name] on 05/15/2022 09:09:14 -------------------------------------------------------------------------------- Physician Orders Details Patient Name: Date of Service: [name] MES E. 05/15/2022 8:45 A M Medical Record Number: [id number] Patient Account Number: [id number] Date of Birth/Sex: Treating RN: [birth year] (56 y.o. 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 Hyperbaric  Oxygen Therapy Evaluate for HBO Therapy Indication: - Radiation Cystitis 2.0 ATA for 90 Minutes without A Breaks ir Total Number of Treatments: - 40 One treatments per day (delivered Monday through Friday unless otherwise specified in Special Instructions below): Finger stick Blood Glucose Pre- and Post- HBOT Treatment. Follow Hyperbaric Oxygen Glycemia Protocol Afrin (Oxymetazoline HCL) 0.05% nasal spray - 1 spray in both nostrils daily as needed prior to HBO treatment for difficulty clearing ears GLYCEMIA INTERVENTIONS PROTOCOL PRE-HBO GLYCEMIA INTERVENTIONS ACTION INTERVENTION LARKIN, MORELOS ([id number]) [id number] Page 3 of 7 Obtain pre-HBO capillary blood glucose (ensure 1 physician order is in chart). A. Notify HBO physician and await physician orders. 2 If result is 70 mg/dl or below: B. If the result meets the hospital definition of a critical result, follow hospital policy. A. Give patient an 8 ounce Glucerna Shake, an 8 ounce Ensure, or 8 ounces of a Glucerna/Ensure equivalent dietary supplement*. B. Wait 30 minutes. If result is 71 mg/dl to 130 mg/dl: C. Retest patients capillary blood glucose (CBG). D. If result greater than or equal to 110 mg/dl, proceed with HBO. If result less than 110 mg/dl, notify HBO physician and consider holding HBO. If result is 131 mg/dl to 249 mg/dl: A. Proceed with HBO. A. Notify HBO physician and await physician orders. B. It is recommended to hold HBO and do If result is 250 mg/dl or greater: blood/urine ketone testing. C. If the result meets the hospital definition of a critical result, follow hospital policy. POST-HBO GLYCEMIA INTERVENTIONS ACTION INTERVENTION Obtain post HBO capillary blood glucose (ensure 1 physician order is in chart). A. Notify HBO physician and await physician orders. 2 If result is 70 mg/dl or below: B. If the result meets the hospital definition of a critical result,  follow hospital policy. A. Give patient an 8 ounce Glucerna Shake, an 8 ounce Ensure, or 8 ounces of a Glucerna/Ensure equivalent dietary supplement*. B. Wait 15 minutes for symptoms of If result is 71 mg/dl to 100 mg/dl: hypoglycemia (i.e. nervousness, anxiety, sweating, chills, clamminess, irritability, confusion, tachycardia or dizziness). C. If patient asymptomatic, discharge patient. If patient symptomatic, repeat capillary blood glucose (CBG) and notify HBO physician. If result is 101 mg/dl  to 249 mg/dl: A. Discharge patient. A. Notify HBO physician and await physician orders. B. It is recommended to do blood/urine ketone If result is 250 mg/dl or greater: testing. C. If the result meets the hospital definition of a critical result, follow hospital policy. *Juice or candies are NOT equivalent products. If patient refuses the Glucerna or Ensure, please consult the hospital dietitian for an appropriate substitute. Electronic Signature(s) Signed: 05/15/2022 9:09:30 AM By: [name] MD FACS Entered By: [name] on 05/15/2022 09:09:30 -------------------------------------------------------------------------------- Problem List Details Patient Name: Date of Service: [name] MES E. 05/15/2022 8:45 A M Medical Record Number: [id number] Patient Account Number: [id number] Date of Birth/Sex: Treating RN: 1965-06-17 (56 y.o. M) Primary Care Provider: [name] Other Clinician: Referring Provider: Treating Provider/Extender: [name] in Treatment: 6 Active Problems ICD-10 Encounter Code Description Active Date MDM Diagnosis N30.41 Irradiation cystitis with hematuria 03/31/2022 No Yes RAMSEY, GUADAMUZ ([id number]) [id number] Page 4 of 7 R31.9 Hematuria, unspecified 03/31/2022 No Yes C61 Malignant neoplasm of prostate 03/31/2022 No Yes E10.3529 Type 1 diabetes mellitus with proliferative diabetic  retinopathy with traction 03/31/2022 No Yes retinal detachment involving the macula, unspecified eye Inactive Problems Resolved Problems Electronic Signature(s) Signed: 05/15/2022 9:06:43 AM By: [name] MD FACS Entered By: [name] on 05/15/2022 09:06:43 -------------------------------------------------------------------------------- Progress Note Details Patient Name: Date of Service: [name] MES E. 05/15/2022 8:45 A M Medical Record Number: [id number] Patient Account Number: [id number] Date of Birth/Sex: Treating RN: 1966-01-04 (56 y.o. M) Primary Care Provider: [name] Other Clinician: Referring Provider: Treating Provider/Extender: [name] in Treatment: 6 Subjective Chief Complaint Information obtained from Patient Patient presents to the Wound Care center for HBO eval due to radiation cystitis with hematuria History of Present Illness (HPI) ADMISSION 03/31/2022 This is a 56 year old male with a past medical history significant for type 1 diabetes mellitus and prostate cancer. He underwent a robotic prostatectomy with retroperitoneal lymph node dissection in 2017. He developed recurrence based upon biochemical data in 2019 and received salvage radiation therapy from October to December of that year. He received 45 Gy in 25 fractions of 1.8 Gy followed by a boost to the prostate bed for a total dose of 68.4 Gy. In February 2023, He developed oligometastatic recurrence to multiple lymph nodes and to bone. As result, he underwent additional radiation therapy including SBRT to the right eighth rib and ultra hypofractionated radiotherapy to the periaortic and presacral lymph nodes. He received 50 Gy in 5 fractions to the right eighth rib and 50 Gy in 10 fractions to the periaortic presacral lymph nodes while delivering 30 Gy in 10 fractions to the involved echelons. After this treatment, he was placed on antihormonal therapy. He  began experiencing intermittent gross hematuria with passage of dark clots in May 2023. In September 2023, he presented to the emergency department with urinary retention and frank blood from his penis. A Foley catheter was placed but this obstructed with blood. It was flushed and replaced. He subsequently was admitted to the hospital on March 20, 2022 after he removed his Foley catheter at home, feeling like it was clogged. He was experiencing daily hematuria with clots and became obstructed once again. A CT scan performed showed moderate intraluminal hemorrhage in the bladder. He was taken to the operating room the next day by urology who performed cystoscopy and clot evacuation with fulguration. The intraoperative findings included moderate clot burden, cystitis changes of the bladder mucosa and  active bleeding from the trigone and posterior bladder wall. He also was anemic with a hemoglobin down to 6.5 g/dL on October 8, with a baseline normal hemoglobin at 12.9. He required transfusion to resolve this. 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 05/21/2022: 3-day HBO update. He has been tolerating his hyperbaric oxygen therapy without difficulty. Unfortunately, he continues to suffer with episodic hematuria and urinary retention secondary to clot burden. He has required multiple catheter-based interventions and upsizing of an indwelling Foley catheter to the point of a 40 [place], which was just removed yesterday. He is currently taking Pyridium to help with bladder spasms. Patient History Information obtained from Patient. Family History Unknown History. Social History Former smoker - started on 06/15/1998, Marital Status - Married, Alcohol Use - Moderate, Drug Use - No History, Caffeine Use - Daily. Medical History Respiratory TRIGO, WINTERBOTTOM ([id number]) [id number] Page 5 of 7 Patient has history of  Asthma - Uses Inhaler Cardiovascular Patient has history of Hypertension Endocrine Patient has history of Type I Diabetes - Takes Tresiba (Long acting) and Lyumjev (short acting) Oncologic Patient has history of Received Radiation - 08/27/21-09/09/21 Hospitalization/Surgery History - Cystoscopy and fulgeration (03/21/22); Schwannoma (2005);Eye surgery;Lymphadenectomy (2017); Robotic assisted Lap. Radical Protatectomy Level 2. Medical A Surgical History Notes nd Eyes WP:[id number] Retinopathy Respiratory Hx: Bronchitis Sinusitis Rhinitis Cardiovascular Hardening of the aorta Genitourinary SN:KNLZJ Hematuria Oncologic Prostate cancer Objective Constitutional He is hypertensive, but asymptomatic.Marland Kitchen No acute distress. Vitals Time Taken: 8:46 AM, Height: 70 in, Weight: 210 lbs, BMI: 30.1, Temperature: 98.1 F, Pulse: 95 bpm, Respiratory Rate: 18 breaths/min, Blood Pressure: 179/84 mmHg, Capillary Blood Glucose: 225 mg/dl. Respiratory Normal work of breathing on room air. Clear to auscultation bilaterally. No wheezes, rales, or rhonchi.. 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 Hyperbaric Oxygen Therapy: Evaluate for HBO Therapy Indication: - Radiation Cystitis 2.0 ATA for 90 Minutes without Air Breaks T Number of Treatments: - 40 otal One treatments per day (delivered Monday through Friday unless otherwise specified in Special Instructions below): Finger stick Blood Glucose Pre- and Post- HBOT Treatment. Follow Hyperbaric Oxygen Glycemia Protocol Afrin (Oxymetazoline HCL) 0.05% nasal spray - 1 spray in both nostrils daily as needed prior to HBO treatment for difficulty clearing ears 05/15/2022: This is a  30-day hyperbaric oxygen therapy update. He continues to tolerate his treatments well, but is having issues with ongoing hematuria. We will continue his treatments; I anticipate extending the duration once he completes his current regimen of 40. Electronic Signature(s) EXAVIER, LINA ([id number]) [id number] Page 6 of 7 Signed: 05/15/2022 9:10:51 AM By: [name] MD FACS Entered By: [name] on 05/15/2022 09:10:50 -------------------------------------------------------------------------------- HxROS Details Patient Name: Date of Service: [name] MES E. 05/15/2022 8:45 A M Medical Record Number: [id number] Patient Account Number: [id number] Date of Birth/Sex: Treating RN: January 03, 1966 (56 y.o. M) Primary Care Provider: [name] Other Clinician: Referring Provider: Treating Provider/Extender: [name] in Treatment: 6 Information Obtained From Patient Eyes Medical History: Past Medical History Notes: GD:[id number] Retinopathy Respiratory Medical History: Positive for: Asthma - Uses Inhaler Past Medical History Notes: Hx: Bronchitis Sinusitis Rhinitis Cardiovascular Medical History: Positive for: Hypertension Past Medical History Notes: Hardening of the aorta Endocrine Medical History: Positive for: Type I Diabetes -  Takes Tyler Aas (Long acting) and Lyumjev (short acting) Time with diabetes: 31 years Treated with: Insulin Blood sugar tested every day: Yes Tested : Wears a Libre on right arm Blood sugar testing results: Breakfast: 88 Genitourinary Medical History: Past Medical History Notes: AX:KPVVZ Hematuria Oncologic Medical History: Positive for: Received Radiation - 08/27/21-09/09/21 Past Medical History Notes: Prostate cancer Immunizations Pneumococcal Vaccine: Received Pneumococcal Vaccination: No Implantable Devices None Hospitalization / Surgery History Type of  Hospitalization/Surgery READE, TREFZ ([id number]) [id number] Page 7 of 7 Cystoscopy and fulgeration (03/21/22); Schwannoma (2005);Eye surgery;Lymphadenectomy (2017); Robotic assisted Lap. Radical Protatectomy Level 2 Family and Social History Unknown History: Yes; Former smoker - started on 06/15/1998; Marital Status - Married; Alcohol Use: Moderate; Drug Use: No History; Caffeine Use: Daily; Financial Concerns: No; Food, Clothing or Shelter Needs: No; Support System Lacking: No; Transportation Concerns: No Electronic Signature(s) Signed: 05/15/2022 10:59:49 AM By: [name] MD FACS Entered By: [name] on 05/15/2022 09:08:46 -------------------------------------------------------------------------------- SuperBill Details Patient Name: Date of Service: [name] MES E. 05/15/2022 Medical Record Number: [id number] Patient Account Number: [id number] Date of Birth/Sex: Treating RN: 1965-10-14 (56 y.o. 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, unspecified eye Facility Procedures : CPT4 Code: 71219758 Description: 83254 - WOUND CARE VISIT-LEV 2 EST PT Modifier: Quantity: 1 Physician Procedures : CPT4 Code Description Modifier [id number] - WC PHYS LEVEL 3 - EST PT ICD-10 Diagnosis Description 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 Hematuria, unspecified C61 Malignant neoplasm of prostate E10.3529 Type 1 diabetes mellitus with  proliferative diabetic retinopathy with traction retinal detachment involvi unspecified  eye Quantity: 1 ng the macula, Electronic Signature(s) Signed: 05/15/2022 9:11:03 AM By: [name] MD FACS Entered By: [name] on 05/15/2022 09:11:03

## 2022-05-15 NOTE — Progress Notes (Signed)
KHARTER, BREW ([id number]) [id number] Page 1 of 2 Visit Report for 05/15/2022 Arrival Information Details Patient Name: Date of Service: Dillon Dun MES E. 05/15/2022 10:00 A M Medical Record Number: [id number] Patient Account Number: [id number] Date of Birth/Sex: Treating RN: 02-24-1966 (56 y.o. [name], Meta.Reding Primary Care [place]: [name] Other Clinician: Donavan Burnet Referring Arloa Prak: Treating Clariza Sickman/Extender: [name] in Treatment: 6 Visit Information History Since Last Visit All ordered tests and consults were completed: Yes Patient Arrived: Ambulatory Added or deleted any medications: No Arrival Time: 08:42 Any new allergies or adverse reactions: No Accompanied By: self Had a fall or experienced change in No Transfer Assistance: None activities of daily living that may affect Patient Identification Verified: Yes risk of falls: Secondary Verification Process Completed: Yes Signs or symptoms of abuse/neglect since last visito No Patient Requires Transmission-Based Precautions: No Hospitalized since last visit: No Patient Has Alerts: No Implantable device outside of the clinic excluding No cellular tissue based products placed in the center since last visit: Pain Present Now: Yes Electronic Signature(s) Signed: 05/15/2022 1:18:14 PM By: Donavan Burnet CHT EMT BS , , Entered By: Donavan Burnet on 05/15/2022 13:18:13 -------------------------------------------------------------------------------- Encounter Discharge Information Details Patient Name: Date of Service: Dillon Dun MES E. 05/15/2022 10:00 A M Medical Record Number: [id number] Patient Account Number: [id number] Date of Birth/Sex: Treating RN: Jan 14, 1966 (56 y.o. [name] Primary Care Jaylynne Birkhead: [name] Other Clinician: Donavan Burnet Referring Kyl Givler: Treating Louvenia Golomb/Extender: [name] in Treatment: 6 Encounter Discharge Information Items Discharge Condition: Stable Ambulatory Status: Ambulatory Discharge Destination: Home Transportation: Private Auto Accompanied By: self Schedule Follow-up Appointment: No Clinical Summary of Care: Electronic Signature(s) Signed: 05/15/2022 1:23:05 PM By: Donavan Burnet CHT EMT BS , , Entered By: Donavan Burnet on 05/15/2022 13:23:05 [name] ([id number]) [id number] Page 2 of 2 -------------------------------------------------------------------------------- Vitals Details Patient Name: Date of Service: Dillon Dun MES E. 05/15/2022 10:00 A M Medical Record Number: [id number] Patient Account Number: [id number] Date of Birth/Sex: Treating RN: Feb 17, 1966 (56 y.o. [name] Primary Care Lakindra Wible: [name] Other Clinician: Donavan Burnet Referring Brion Hedges: Treating Jonah Gingras/Extender: [name] in Treatment: 6 Vital Signs Time Taken: 09:20 Temperature (F): 97.9 Height (in): 70 Pulse (bpm): 94 Weight (lbs): 210 Respiratory Rate (breaths/min): 18 Body Mass Index (BMI): 30.1 Blood Pressure (mmHg): 139/64 Capillary Blood Glucose (mg/dl): 272 Reference Range: 80 - 120 mg / dl Electronic Signature(s) Signed: 05/15/2022 1:18:57 PM By: Donavan Burnet CHT EMT BS , , Entered By: Donavan Burnet on 05/15/2022 13:18:57

## 2022-05-15 NOTE — Progress Notes (Signed)
[name], [name] ([id number]) [id number] Page 1 of 6 Visit Report for 05/15/2022 Arrival Information Details Patient Name: Date of Service: [name] [name]. 05/15/2022 8:45 A M Medical Record Number: [id number] Patient Account Number: [id number] Date of Birth/Sex: Treating RN: July 07, 1965 (57 y.o. [name] Primary Care [name]: [name] Other Clinician: Referring Kytzia Gienger: Treating [name]/Extender: [name] in Treatment: 6 Visit Information History Since Last Visit Added or deleted any medications: No Patient Arrived: Ambulatory Any new allergies or adverse reactions: No Arrival Time: 08:44 Had a fall or experienced change in No Accompanied By: self activities of daily living that may affect Transfer Assistance: None risk of falls: Patient Identification Verified: Yes Signs or symptoms of abuse/neglect since last visito No Secondary Verification Process Completed: Yes Hospitalized since last visit: No Patient Requires Transmission-Based Precautions: No Implantable device outside of the clinic excluding No Patient Has Alerts: No cellular tissue based products placed in the center since last visit: Pain Present Now: Yes Electronic Signature(s) Signed: 05/15/2022 3:37:43 PM By: Adline Peals Entered By: Adline Peals on 05/15/2022 08:45:15 -------------------------------------------------------------------------------- Clinic Level of Care Assessment Details Patient Name: Date of Service: [name] [name]. 05/15/2022 8:45 A M Medical Record Number: [id number] Patient Account Number: [id number] Date of Birth/Sex: Treating RN: 1965/12/27 (56 y.o. [name] Primary Care [name]: [name] Other Clinician: Referring Tron Flythe: Treating [name]/Extender: [name] in Treatment: 6 Clinic Level of Care Assessment Items TOOL 4 Quantity Score X- 1 0 Use  when only an EandM is performed on FOLLOW-UP visit ASSESSMENTS - Nursing Assessment / Reassessment X- 1 10 Reassessment of Co-morbidities (includes updates in patient status) X- 1 5 Reassessment of Adherence to Treatment Plan ASSESSMENTS - Wound and Skin A ssessment / Reassessment '[]'$  - 0 Simple Wound Assessment / Reassessment - one wound '[]'$  - 0 Complex Wound Assessment / Reassessment - multiple wounds '[]'$  - 0 Dermatologic / Skin Assessment (not related to wound area) ASSESSMENTS - Focused Assessment '[]'$  - 0 Circumferential Edema Measurements - multi extremities '[]'$  - 0 Nutritional Assessment / Counseling / Intervention '[]'$  - 0 Lower Extremity Assessment (monofilament, tuning fork, pulses) [name], [name] ([id number]) [id number] Page 2 of 6 '[]'$  - 0 Peripheral Arterial Disease Assessment (using hand held doppler) ASSESSMENTS - Ostomy and/or Continence Assessment and Care '[]'$  - 0 Incontinence Assessment and Management '[]'$  - 0 Ostomy Care Assessment and Management (repouching, etc.) PROCESS - Coordination of Care X - Simple Patient / Family Education for ongoing care 1 15 '[]'$  - 0 Complex (extensive) Patient / Family Education for ongoing care X- 1 10 Staff obtains Programmer, systems, Records, T Results / Process Orders est '[]'$  - 0 Staff telephones HHA, Nursing Homes / Clarify orders / etc '[]'$  - 0 Routine Transfer to another Facility (non-emergent condition) '[]'$  - 0 Routine Hospital Admission (non-emergent condition) '[]'$  - 0 New Admissions / Biomedical engineer / Ordering NPWT Apligraf, etc. , '[]'$  - 0 Emergency Hospital Admission (emergent condition) X- 1 10 Simple Discharge Coordination '[]'$  - 0 Complex (extensive) Discharge Coordination PROCESS - Special Needs '[]'$  - 0 Pediatric / Minor Patient Management '[]'$  - 0 Isolation Patient Management '[]'$  - 0 Hearing / Language / Visual special needs '[]'$  - 0 Assessment of Community assistance (transportation, D/C  planning, etc.) '[]'$  - 0 Additional assistance / Altered mentation '[]'$  - 0 Support Surface(s) Assessment (bed, cushion, seat, etc.) INTERVENTIONS - Wound Cleansing / Measurement '[]'$  - 0 Simple Wound Cleansing - one wound '[]'$  -  0 Complex Wound Cleansing - multiple wounds '[]'$  - 0 Wound Imaging (photographs - any number of wounds) '[]'$  - 0 Wound Tracing (instead of photographs) '[]'$  - 0 Simple Wound Measurement - one wound '[]'$  - 0 Complex Wound Measurement - multiple wounds INTERVENTIONS - Wound Dressings '[]'$  - 0 Small Wound Dressing one or multiple wounds '[]'$  - 0 Medium Wound Dressing one or multiple wounds '[]'$  - 0 Large Wound Dressing one or multiple wounds '[]'$  - 0 Application of Medications - topical '[]'$  - 0 Application of Medications - injection INTERVENTIONS - Miscellaneous '[]'$  - 0 External ear exam '[]'$  - 0 Specimen Collection (cultures, biopsies, blood, body fluids, etc.) '[]'$  - 0 Specimen(s) / Culture(s) sent or taken to Lab for analysis '[]'$  - 0 Patient Transfer (multiple staff / Civil Service fast streamer / Similar devices) '[]'$  - 0 Simple Staple / Suture removal (25 or less) '[]'$  - 0 Complex Staple / Suture removal (26 or more) '[]'$  - 0 Hypo / Hyperglycemic Management (close monitor of Blood Glucose) '[]'$  - 0 Ankle / Brachial Index (ABI) - do not check if billed separately [name], [name] ([id number]) [id number] Page 3 of 6 X- 1 5 Vital Signs Has the patient been seen at the hospital within the last three years: Yes Total Score: 55 Level Of Care: New/Established - Level 2 Electronic Signature(s) Signed: 05/15/2022 3:37:43 PM By: Adline Peals Entered By: Adline Peals on 05/15/2022 08:59:00 -------------------------------------------------------------------------------- Encounter Discharge Information Details Patient Name: Date of Service: [name] [name]. 05/15/2022 8:45 A M Medical Record Number: [id number] Patient Account Number: [id number] Date of Birth/Sex:  Treating RN: Jun 05, 1966 (56 y.o. [name] Primary Care [name]: [name] Other Clinician: Referring Tamarick Kovalcik: Treating Laniqua Torrens/Extender: [name] in Treatment: 6 Encounter Discharge Information Items Discharge Condition: Stable Ambulatory Status: Ambulatory Discharge Destination: Home Transportation: Private Auto Accompanied By: self Schedule Follow-up Appointment: Yes Clinical Summary of Care: Patient Declined Electronic Signature(s) Signed: 05/15/2022 3:37:43 PM By: Adline Peals Entered By: Adline Peals on 05/15/2022 09:01:07 -------------------------------------------------------------------------------- Lower Extremity Assessment Details Patient Name: Date of Service: [name] [name]. 05/15/2022 8:45 A M Medical Record Number: [id number] Patient Account Number: [id number] Date of Birth/Sex: Treating RN: Oct 31, 1965 (56 y.o. [name] Primary Care Cythnia Osmun: [name] Other Clinician: Referring Tanveer Brammer: Treating Omie Ferger/Extender: [name] in Treatment: 6 Electronic Signature(s) Signed: 05/15/2022 3:37:43 PM By: Sabas Sous By: Adline Peals on 05/15/2022 08:45:59 -------------------------------------------------------------------------------- Multi Wound Chart Details Patient Name: Date of Service: [name] [name]. 05/15/2022 8:45 A M Medical Record Number: [id number] Patient Account Number: [id number] Date of Birth/Sex: Treating RN: 1965/07/28 (56 y.o. [name], [name] ([id number]) [id number] Page 4 of 6 Primary Care Rabecca Birge: [name] Other Clinician: Referring Cerrone Debold: Treating Malini Flemings/Extender: [name] in Treatment: 6 Vital Signs Height(in): 70 Capillary Blood Glucose(mg/dl): 225 Weight(lbs): 210 Pulse(bpm): 95 Body Mass Index(BMI): 30.1 Blood Pressure(mmHg):  179/84 Temperature(F): 98.1 Respiratory Rate(breaths/min): 18 [Treatment Notes:Wound Assessments Treatment Notes] Electronic Signature(s) Signed: 05/15/2022 9:06:48 AM By: [name] MD FACS Entered By: [name] on 05/15/2022 09:06:47 -------------------------------------------------------------------------------- Multi-Disciplinary Care Plan Details Patient Name: Date of Service: [name] [name]. 05/15/2022 8:45 A M Medical Record Number: [id number] Patient Account Number: [id number] Date of Birth/Sex: Treating RN: 27-Apr-1966 (56 y.o. 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 05/15/2022 8:45 A M Medical Record Number: [id number] Patient Account Number: [id number] Date of Birth/Sex: Treating RN: [birth year] (56 y.o. [name] Primary Care Takeru Bose: [name] Other Clinician: Referring Braidon Chermak: Treating Nikodem Leadbetter/Extender: Jak, Haggar ([id number]) [id number] Page 5 of 6 Weeks in Treatment: 6 Active Problems Location of Pain Severity and  Description of Pain Patient Has Paino Yes Site Locations Duration of the Pain. Constant / Intermittento Intermittent Rate the pain. Current Pain Level: 5 Pain Management and Medication Current Pain Management: Medication: Yes Electronic Signature(s) Signed: 05/15/2022 3:37:43 PM By: Adline Peals Entered By: Adline Peals on 05/15/2022 08:45:56 -------------------------------------------------------------------------------- Patient/Caregiver Education Details Patient Name: Date of Service: [name] [name]. 12/1/2023andnbsp8:45 A M Medical Record Number: [id number] Patient Account Number: [id number] Date of Birth/Gender: Treating RN: [birth year] (56 y.o. [name] Primary Care Physician: [name] Other Clinician: Referring Physician: Treating Physician/Extender: [name] in Treatment: 6 Education Assessment Education Provided To: Patient Education Topics Provided Pain: Methods: Explain/Verbal Responses: Reinforcements needed, State content correctly Electronic Signature(s) Signed: 05/15/2022 3:37:43 PM By: Sabas Sous By: Adline Peals on 05/15/2022 08:58:28 [name], [name] ([id number]) [id number] Page 6 of 6 -------------------------------------------------------------------------------- Vitals Details Patient Name: Date of Service: [name] [name]. 05/15/2022 8:45 A M Medical Record Number: [id number] Patient Account Number: [id number] Date of Birth/Sex: Treating RN: 04-15-1966 (56 y.o. [name] Primary Care Ankush Gintz: [name] Other Clinician: Referring Darlyne Schmiesing: Treating Fallyn Munnerlyn/Extender: [name] in Treatment: 6 Vital Signs Time Taken: 08:46 Temperature (F): 98.1 Height (in): 70 Pulse (bpm): 95 Weight (lbs): 210 Respiratory Rate (breaths/min): 18 Body Mass Index (BMI): 30.1 Blood Pressure (mmHg):  179/84 Capillary Blood Glucose (mg/dl): 225 Reference Range: 80 - 120 mg / dl Electronic Signature(s) Signed: 05/15/2022 3:37:43 PM By: Adline Peals Entered By: Adline Peals on 05/15/2022 08:46:32

## 2022-05-15 NOTE — Progress Notes (Signed)
[name], [name] ([id number]) [id number] Page 1 of 2 Visit Report for 05/15/2022 HBO Details Patient Name: Date of Service: Dillon Dun MES E. 05/15/2022 10:00 A M Medical Record Number: [id number] Patient Account Number: [id number] Date of Birth/Sex: Treating RN: 18-Aug-1965 (56 y.o. [name] Primary Care Danica Camarena: [name] Other Clinician: Donavan Burnet Referring Horice Carrero: Treating Jonessa Triplett/Extender: [name] in Treatment: 6 HBO Treatment Course Details Treatment Course Number: 1 Ordering Rande Roylance: [name] T Treatments Ordered: otal 40 HBO Treatment Start Date: 04/09/2022 HBO Indication: Late Effect of Radiation HBO Treatment Details Treatment Number: 23 Patient Type: Outpatient Chamber Type: Monoplace Chamber Serial #: [id number] Treatment Protocol: 2.0 ATA with 90 minutes oxygen, and no air breaks Treatment Details Compression Rate Down: 2.0 psi / minute De-Compression Rate Up: 2.0 psi / minute Air breaks and breathing Decompress Decompress Compress Tx Pressure Begins Reached periods Begins Ends (leave unused spaces blank) Chamber Pressure (ATA 1 2 ------2 1 ) Clock Time (24 hr) 09:28 09:36 - - - - - - 11:06 11:14 Treatment Length: 106 (minutes) Treatment Segments: 4 Vital Signs Capillary Blood Glucose Reference Range: 80 - 120 mg / dl HBO Diabetic Blood Glucose Intervention Range: <131 mg/dl or >249 mg/dl Type: Time Vitals Blood Pulse: Respiratory Temperature: Capillary Blood Glucose Pulse Action Taken: Pressure: Rate: Glucose (mg/dl): Meter #: Oximetry (%) Taken: Pre 09:20 139/64 94 18 97.9 272 1 patient asymptomatic for hyperglycemia Post 11:17 153/81 83 18 97.3 230 1 none per protocol Treatment Response Treatment Toleration: Well Treatment Completion Status: Treatment Completed without Adverse Event Treatment Notes Patient safely placed in chamber after performing safety checklist.  Chamber was compressed at a rate set of 1 psi/min until reaching 3 psig at which time patient confirmed that he was experiencing normal ear equalization and rate set was increased to 2 psi/min until reaching treatment depth of 2 ATA. Patient tolerated treatment well and was even able to sleep. Decompression of the chamber proceeded at the same 2 psi/min. Patient tolerated travel well. Patient denied issues with ear equalization saying he felt fine. Patient was stable upon discharge. Physician HBO Attestation: I certify that I supervised this HBO treatment in accordance with Medicare guidelines. A trained emergency response team is readily available per Yes hospital policies and procedures. Continue HBOT as ordered. Yes Electronic Signature(s) Signed: 05/18/2022 12:19:38 PM By: [name] DO Previous Signature: 05/15/2022 1:28:50 PM Version By: Donavan Burnet CHT EMT BS , , Previous Signature: 05/15/2022 1:22:08 PM Version By: Donavan Burnet CHT EMT BS , , Entered By: [name] on 05/18/2022 12:18:36 [name] ([id number]) [id number] Page 2 of 2 -------------------------------------------------------------------------------- HBO Safety Checklist Details Patient Name: Date of Service: Dillon Dun MES E. 05/15/2022 10:00 A M Medical Record Number: [id number] Patient Account Number: [id number] Date of Birth/Sex: Treating RN: September 25, 1965 (56 y.o. [name] Primary Care Leasia Swann: [name] Other Clinician: Donavan Burnet Referring Zaleah Ternes: Treating Skarlett Sedlacek/Extender: [name] in Treatment: 6 [name] Safety Checklist Consent Form Signed Patient voided / foley secured and emptied Foley was removed When did you last eato 0730 Last dose of injectable or oral agent 0745 Ostomy pouch emptied and vented if applicable NA All implantable devices assessed, documented and  approved NA Intravenous access site secured and place NA Valuables secured Linens and cotton and cotton/polyester blend (less than 51% polyester) Personal oil-based products / skin lotions / body lotions removed Wigs or hairpieces removed NA Smoking or tobacco materials  removed NA Books / newspapers / magazines / loose paper removed Cologne, aftershave, perfume and deodorant removed Jewelry removed (may wrap wedding band) Make-up removed NA Hair care products removed Battery operated devices (external) removed Heating patches and chemical warmers removed Titanium eyewear removed Nail polish cured greater than 10 hours NA Casting material cured greater than 10 hours NA Hearing aids removed NA Loose dentures or partials removed NA Prosthetics have been removed NA Patient demonstrates correct use of air break device (if applicable) Patient concerns have been addressed Patient grounding bracelet on and cord attached to chamber Specifics for Inpatients (complete in addition to above) Medication sheet sent with patient NA Intravenous medications needed or due during therapy sent with patient NA Drainage tubes (Mooreg. nasogastric tube or chest tube secured and vented) NA Endotracheal or Tracheotomy tube secured NA Cuff deflated of air and inflated with saline NA Airway suctioned NA Notes Paper version used prior to treatment. Electronic Signature(s) Signed: 05/18/2022 1:11:55 PM By: Donavan Burnet CHT EMT BS , , Previous Signature: 05/15/2022 1:20:49 PM Version By: Donavan Burnet CHT EMT BS , , Entered By: Donavan Burnet on 05/18/2022 13:11:55

## 2022-05-18 ENCOUNTER — Encounter (HOSPITAL_BASED_OUTPATIENT_CLINIC_OR_DEPARTMENT_OTHER): Payer: BC Managed Care – PPO | Admitting: Internal Medicine

## 2022-05-18 DIAGNOSIS — R319 Hematuria, unspecified: Secondary | ICD-10-CM | POA: Diagnosis not present

## 2022-05-18 DIAGNOSIS — C61 Malignant neoplasm of prostate: Secondary | ICD-10-CM | POA: Diagnosis not present

## 2022-05-18 DIAGNOSIS — N3041 Irradiation cystitis with hematuria: Secondary | ICD-10-CM | POA: Diagnosis not present

## 2022-05-18 LAB — GLUCOSE, CAPILLARY
Glucose-Capillary: 230 mg/dL — ABNORMAL HIGH (ref 70–99)
Glucose-Capillary: 144 mg/dL — ABNORMAL HIGH (ref 70–99)
Glucose-Capillary: 137 mg/dL — ABNORMAL HIGH (ref 70–99)

## 2022-05-18 NOTE — Progress Notes (Signed)
EMILEO, SEMEL ([id number]) [id number] Page 1 of 1 Visit Report for 05/15/2022 SuperBill Details Patient Name: Date of Service: [name]. 05/15/2022 Medical Record Number: [id number] Patient Account Number: [id number] Date of Birth/Sex: Treating RN: [birth year] (56 y.o. 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, unspecified eye Facility Procedures CPT4 Code Description Modifier Quantity [id number] [id number]-(Facility Use Only) HBOT full body chamber, 6mn , 4 ICD-10 Diagnosis Description 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 Hematuria, unspecified C61 Malignant neoplasm of prostate 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

## 2022-05-18 NOTE — Progress Notes (Signed)
LEMON, STERNBERG ([id number]) [id number] Page 1 of 2 Visit Report for 05/18/2022 HBO Details Patient Name: Date of Service: [name] MES E. 05/18/2022 10:00 A M Medical Record Number: [id number] Patient Account Number: [id number] Date of Birth/Sex: Treating RN: 1965/06/27 (56 y.o. [name], [place] Primary Care Bristol Osentoski: [name] Other Clinician: [name] Referring Kynadie Yaun: Treating Mel Tadros/Extender: [name] in Treatment: 6 HBO Treatment Course Details Treatment Course Number: 1 Ordering Amber Guthridge: [name] T Treatments Ordered: otal 40 HBO Treatment Start Date: 04/09/2022 HBO Indication: Late Effect of Radiation HBO Treatment Details Treatment Number: 24 Patient Type: Outpatient Chamber Type: Monoplace Chamber Serial #: [id number] Treatment Protocol: 2.0 ATA with 90 minutes oxygen, and no air breaks Treatment Details Compression Rate Down: 2.0 psi / minute De-Compression Rate Up: 2.0 psi / minute Air breaks and breathing Decompress Decompress Compress Tx Pressure Begins Reached periods Begins Ends (leave unused spaces blank) Chamber Pressure (ATA 1 2 ------2 1 ) Clock Time (24 hr) 10:09 10:17 - - - - - - 11:47 11:56 Treatment Length: 107 (minutes) Treatment Segments: 4 Vital Signs Capillary Blood Glucose Reference Range: 80 - 120 mg / dl HBO Diabetic Blood Glucose Intervention Range: <131 mg/dl or >249 mg/dl Time Vitals Blood Respiratory Capillary Blood Glucose Pulse Action Type: Pulse: Temperature: Taken: Pressure: Rate: Glucose (mg/dl): Meter #: Oximetry (%) Taken: Pre 10:01 158/80 63 18 97.5 144 Post 12:02 170/84 82 18 97.7 137 Treatment Response Treatment Toleration: Well Treatment Completion Status: Treatment Completed without Adverse Event Physician HBO Attestation: I certify that I supervised this HBO treatment in accordance with Medicare guidelines. A trained emergency response  team is readily available per Yes hospital policies and procedures. Continue HBOT as ordered. Yes Electronic Signature(s) Signed: 05/19/2022 12:20:03 PM By: [name] DO Previous Signature: 05/18/2022 1:12:44 PM Version By: [name] EMT Entered By: [name] on 05/19/2022 11:34:59 [name] ([id number]) [id number] Page 2 of 2 -------------------------------------------------------------------------------- HBO Safety Checklist Details Patient Name: Date of Service: [name] MES E. 05/18/2022 10:00 A M Medical Record Number: [id number] Patient Account Number: [id number] Date of Birth/Sex: Treating RN: 1965-11-09 (56 y.o. [name], [place] Primary Care Randell Detter: [name] Other Clinician: [name] Referring Chee Kinslow: Treating Willman Cuny/Extender: [name] in Treatment: 6 [name] Safety Checklist Consent Form Signed Patient voided / foley secured and emptied When did you last eato 0715 Last dose of injectable or oral agent 0730 Ostomy pouch emptied and vented if applicable NA All implantable devices assessed, documented and approved NA Foley Cath removed 05/14/2022 Intravenous access site secured and place NA Valuables secured Linens and cotton and cotton/polyester blend (less than 51% polyester) Personal oil-based products / skin lotions / body lotions removed Wigs or hairpieces removed NA Smoking or tobacco materials removed Books / newspapers / magazines / loose paper removed Cologne, aftershave, perfume and deodorant removed Jewelry removed (may wrap wedding band) Make-up removed NA Hair care products removed Battery operated devices (external) removed Heating patches and chemical warmers removed Titanium eyewear removed NA Nail polish cured greater than 10 hours NA Casting material cured greater than 10 hours NA Hearing aids removed NA Loose dentures or  partials removed NA Prosthetics have been removed NA Patient demonstrates correct use of air break device (if applicable) Patient concerns have been addressed Patient grounding bracelet on and cord attached to chamber Specifics for Inpatients (complete in addition to above) Medication sheet sent with patient NA Intravenous medications needed or  due during therapy sent with patient NA Drainage tubes (e.g. nasogastric tube or chest tube secured and vented) NA Endotracheal or Tracheotomy tube secured NA Cuff deflated of air and inflated with saline NA Airway suctioned NA Notes The safety check was done before the treatment was started. Electronic Signature(s) Signed: 05/18/2022 1:11:35 PM By: [name] EMT Entered By: [name] on 05/18/2022 13:11:35

## 2022-05-18 NOTE — Progress Notes (Signed)
KATRINA, BROSH ([id number]) [id number] Page 1 of 2 Visit Report for 05/18/2022 Arrival Information Details Patient Name: Date of Service: Dillon Dun MES E. 05/18/2022 10:00 A M Medical Record Number: [id number] Patient Account Number: [id number] Date of Birth/Sex: Treating RN: 25-Dec-1965 (56 y.o. [name], [name] Primary Care Alexandre Lightsey: [name] Other Clinician: [name] Referring Anya Murphey: Treating Tanis Hensarling/Extender: [name] in Treatment: 6 Visit Information History Since Last Visit All ordered tests and consults were completed: Yes Patient Arrived: Ambulatory Added or deleted any medications: No Arrival Time: 09:41 Any new allergies or adverse reactions: No Accompanied By: None Had a fall or experienced change in No Transfer Assistance: None activities of daily living that may affect Patient Identification Verified: Yes risk of falls: Secondary Verification Process Completed: Yes Signs or symptoms of abuse/neglect since last visito No Patient Requires Transmission-Based Precautions: No Hospitalized since last visit: No Patient Has Alerts: No Implantable device outside of the clinic excluding No cellular tissue based products placed in the center since last visit: Pain Present Now: No Electronic Signature(s) Signed: 05/18/2022 1:00:56 PM By: [name] EMT Previous Signature: 05/18/2022 1:00:28 PM Version By: [name] EMT Entered By: [name] on 05/18/2022 13:00:56 -------------------------------------------------------------------------------- Encounter Discharge Information Details Patient Name: Date of Service: Dillon Dun MES E. 05/18/2022 10:00 A M Medical Record Number: [id number] Patient Account Number: [id number] Date of Birth/Sex: Treating RN: April 18, 1966 (56 y.o. [name], [name] Primary Care Galan Ghee: [name] Other Clinician: [name] Referring Jerzey Komperda: Treating  Elyza Whitt/Extender: [name] in Treatment: 6 Encounter Discharge Information Items Discharge Condition: Stable Ambulatory Status: Ambulatory Discharge Destination: Home Transportation: Private Auto Accompanied By: None Schedule Follow-up Appointment: Yes Clinical Summary of Care: Electronic Signature(s) Signed: 05/18/2022 1:14:27 PM By: [name] EMT Entered By: [name] on 05/18/2022 13:14:27 [name] ([id number]) [id number] Page 2 of 2 -------------------------------------------------------------------------------- Vitals Details Patient Name: Date of Service: Dillon Dun MES E. 05/18/2022 10:00 A M Medical Record Number: [id number] Patient Account Number: [id number] Date of Birth/Sex: Treating RN: 20-Dec-1965 (56 y.o. [name], [name] Primary Care Shaneca Orne: [name] Other Clinician: [name] Referring Cady Hafen: Treating Rose Hippler/Extender: [name] in Treatment: 6 Vital Signs Time Taken: 10:01 Temperature (F): 97.5 Height (in): 70 Pulse (bpm): 63 Weight (lbs): 210 Respiratory Rate (breaths/min): 18 Body Mass Index (BMI): 30.1 Blood Pressure (mmHg): 158/80 Capillary Blood Glucose (mg/dl): 144 Reference Range: 80 - 120 mg / dl Electronic Signature(s) Signed: 05/18/2022 1:01:25 PM By: [name] EMT Entered By: [name] on 05/18/2022 13:01:25

## 2022-05-19 ENCOUNTER — Encounter (HOSPITAL_BASED_OUTPATIENT_CLINIC_OR_DEPARTMENT_OTHER): Payer: BC Managed Care – PPO | Admitting: Internal Medicine

## 2022-05-19 DIAGNOSIS — C61 Malignant neoplasm of prostate: Secondary | ICD-10-CM

## 2022-05-19 DIAGNOSIS — N3041 Irradiation cystitis with hematuria: Secondary | ICD-10-CM | POA: Diagnosis not present

## 2022-05-19 DIAGNOSIS — R319 Hematuria, unspecified: Secondary | ICD-10-CM | POA: Diagnosis not present

## 2022-05-19 LAB — GLUCOSE, CAPILLARY
Glucose-Capillary: 114 mg/dL — ABNORMAL HIGH (ref 70–99)
Glucose-Capillary: 85 mg/dL (ref 70–99)
Glucose-Capillary: 206 mg/dL — ABNORMAL HIGH (ref 70–99)

## 2022-05-19 NOTE — Progress Notes (Signed)
NENO, HOHENSEE ([id number]) [id number] Page 1 of 2 Visit Report for 05/18/2022 Problem List Details Patient Name: Date of Service: Dillon Dun MES E. 05/18/2022 10:00 A M Medical Record Number: [id number] Patient Account Number: [id number] Date of Birth/Sex: Treating RN: 09-27-1965 (56 y.o. 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 05/18/2022 Medical Record Number: [id number] Patient Account Number: [id number] Date of Birth/Sex: Treating RN: 04/20/1966 (56 y.o. [name], [name] Primary Care Provider: Reynold [name] Other Clinician: [name] Referring Provider: Treating Provider/Extender: [name] in Treatment: 6 Diagnosis Coding ICD-10 Codes Code Description N30.41 Irradiation cystitis with hematuria [name], [name] ([id number]) [id number] Page 2 of 2 R31.9 Hematuria, unspecified C61 Malignant neoplasm of prostate E10.3529 Type 1 diabetes mellitus with proliferative diabetic  retinopathy with traction retinal detachment involving the macula, unspecified eye Facility Procedures : CPT4 Code: 83094076 Description: G0277-(Facility Use Only) HBOT full body chamber, 21mn , ICD-10 Diagnosis Description 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 Hematuria, unspecified C61 Malignant neoplasm of prostate Modifier: Quantity: 4 Physician Procedures : CPT4 Code Description Modifier [id number] 31594- WC PHYS HYPERBARIC OXYGEN THERAPY ICD-10 Diagnosis Description 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 Hematuria, unspecified C61 Malignant neoplasm of prostate Quantity: 1 Electronic Signature(s) Signed: 05/18/2022 1:13:09 PM By: GValeria BatmanEMT Signed: 05/19/2022 12:20:03 PM By: HKalman ShanDO Entered By: GValeria Batmanon 05/18/2022 13:13:09

## 2022-05-19 NOTE — Progress Notes (Signed)
KLINE, BULTHUIS ([id number]) [id number] Page 1 of 2 Visit Report for 05/19/2022 Arrival Information Details Patient Name: Date of Service: [name] MES E. 05/19/2022 10:00 A M Medical Record Number: [id number] Patient Account Number: [id number] Date of Birth/Sex: Treating RN: 04/28/1966 (56 y.o. [name] Primary Care [name]: [name] Other Clinician: [name] Referring [name]: Treating [name]/Extender: [name] in Treatment: 7 Visit Information History Since Last Visit All ordered tests and consults were completed: Yes Patient Arrived: Ambulatory Added or deleted any medications: No Arrival Time: 13:57 Any new allergies or adverse reactions: No Accompanied By: None Had a fall or experienced change in No Transfer Assistance: None activities of daily living that may affect Patient Identification Verified: Yes risk of falls: Secondary Verification Process Completed: Yes Signs or symptoms of abuse/neglect since last visito No Patient Requires Transmission-Based Precautions: No Hospitalized since last visit: No Patient Has Alerts: No Implantable device outside of the clinic excluding No cellular tissue based products placed in the center since last visit: Pain Present Now: No Electronic Signature(s) Signed: 05/19/2022 2:00:00 PM By: [name] EMT Entered By: [name] on 05/19/2022 14:00:00 -------------------------------------------------------------------------------- Encounter Discharge Information Details Patient Name: Date of Service: [name] MES E. 05/19/2022 10:00 A M Medical Record Number: [id number] Patient Account Number: [id number] Date of Birth/Sex: Treating RN: June 13, 1966 (56 y.o. [name] Primary Care [name]: [name] Other Clinician: [name] Referring [name]: Treating [name]/Extender: [name] in Treatment:  7 Encounter Discharge Information Items Discharge Condition: Stable Ambulatory Status: Ambulatory Discharge Destination: Home Transportation: Private Auto Accompanied By: None Schedule Follow-up Appointment: Yes Clinical Summary of Care: Electronic Signature(s) Signed: 05/19/2022 2:11:09 PM By: [name] EMT Entered By: [name] on 05/19/2022 14:11:09 [name] ([id number]) [id number] Page 2 of 2 -------------------------------------------------------------------------------- Vitals Details Patient Name: Date of Service: [name] MES E. 05/19/2022 10:00 A M Medical Record Number: [id number] Patient Account Number: [id number] Date of Birth/Sex: Treating RN: 1966-03-01 (56 y.o. [name] Primary Care Anjela Cassara: [name] Other Clinician: [name] Referring [name]: Treating [name]: [name] in Treatment: 7 Vital Signs Time Taken: 09:53 Capillary Blood Glucose (mg/dl): 85 Height (in): 70 Reference Range: 80 - 120 mg / dl Weight (lbs): 210 Body Mass Index (BMI): 30.1 Electronic Signature(s) Signed: 05/19/2022 2:00:25 PM By: [name] EMT Entered By: [name] on 05/19/2022 14:00:25

## 2022-05-19 NOTE — Progress Notes (Signed)
DALON, REICHART ([id number]) [id number] Page 1 of 2 Visit Report for 05/19/2022 Problem List Details Patient Name: Date of Service: [name] MES E. 05/19/2022 10:00 A M Medical Record Number: [id number] Patient Account Number: [id number] Date of Birth/Sex: Treating RN: February 29, 1966 (56 y.o. 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 05/19/2022 Medical Record Number: [id number] Patient Account Number: [id number] Date of Birth/Sex: Treating RN: May 09, 1966 (56 y.o. [name] Primary Care Provider: [name] Other Clinician: [name] Referring Provider: Treating Provider/Extender: [name] in Treatment: 7 Diagnosis Coding ICD-10 Codes Code Description N30.41 Irradiation cystitis with hematuria ANTOIN, DARGIS ([id number]) [id number] Page 2 of 2 R31.9 Hematuria, unspecified C61 Malignant neoplasm of prostate E10.3529 Type 1 diabetes mellitus with proliferative diabetic retinopathy with  traction retinal detachment involving the macula, unspecified eye Facility Procedures : CPT4 Code: 94496759 Description: G0277-(Facility Use Only) HBOT full body chamber, 102mn , ICD-10 Diagnosis Description 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 Hematuria, unspecified C61 Malignant neoplasm of prostate Modifier: Quantity: 4 Physician Procedures : CPT4 Code Description Modifier [id number] 59935- WC PHYS HYPERBARIC OXYGEN THERAPY ICD-10 Diagnosis Description 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 Hematuria, unspecified C61 Malignant neoplasm of prostate Quantity: 1 Electronic Signature(s) Signed: 05/19/2022 2:09:25 PM By: GValeria BatmanEMT Signed: 05/19/2022 2:57:20 PM By: HKalman ShanDO Entered By: GValeria Batmanon 05/19/2022 14:09:25

## 2022-05-19 NOTE — Progress Notes (Addendum)
AYSON, CHERUBINI ([id number]) [id number] Page 1 of 2 Visit Report for 05/19/2022 HBO Details Patient Name: Date of Service: [name]. 05/19/2022 10:00 A M Medical Record Number: [id number] Patient Account Number: [id number] Date of Birth/Sex: Treating RN: [birth year] (56 y.o. [name] Primary Care [name]: [name] Other Clinician: [name] Referring [name]: Treating [name]/Extender: [name] in Treatment: 7 HBO Treatment Course Details Treatment Course Number: 1 Ordering [name]: [name] T Treatments Ordered: otal 40 HBO Treatment Start Date: 04/09/2022 HBO Indication: Late Effect of Radiation HBO Treatment Details Treatment Number: 25 Patient Type: Outpatient Chamber Type: Monoplace Chamber Serial #: [id number] Treatment Protocol: 2.0 ATA with 90 minutes oxygen, and no air breaks Treatment Details Compression Rate Down: 2.0 psi / minute De-Compression Rate Up: 2.0 psi / minute Air breaks and breathing Decompress Decompress Compress Tx Pressure Begins Reached periods Begins Ends (leave unused spaces blank) Chamber Pressure (ATA 1 2 ------2 1 ) Clock Time (24 hr) 10:51 11:00 - - - - - - 12:31 12:38 Treatment Length: 107 (minutes) Treatment Segments: 4 Vital Signs Capillary Blood Glucose Reference Range: 80 - 120 mg / dl HBO Diabetic Blood Glucose Intervention Range: <131 mg/dl or >249 mg/dl Type: Time Vitals Blood Pulse: Respiratory Capillary Blood Glucose Pulse Action Temperature: Taken: Pressure: Rate: Glucose (mg/dl): Meter #: Oximetry (%) Taken: Pre 09:53 85 Patient given 8 oz glucerna Post 12:42 149/75 82 18 97.7 206 Pre 10:26 129/54 94 18 97.3 114 Patient given 8 oz Glucerna Treatment Response Treatment Toleration: Well Treatment Completion Status: Treatment Completed without Adverse Event Treatment Notes At 5329 the patient blood sugar was 85 and the Patient given  8 oz Glucerna. At 1026 blood sugar recheck was114. Spoke with Dr. Heber [place]. Dr. Heber [place] stated to give another 8 oz Glucerna and to start treatment. No problems noted during treatment. Physician HBO Attestation: I certify that I supervised this HBO treatment in accordance with Medicare guidelines. A trained emergency response team is readily available per Yes hospital policies and procedures. Continue HBOT as ordered. Yes Electronic Signature(s) Signed: 05/20/2022 12:34:39 PM By: [name] DO Previous Signature: 05/19/2022 2:09:00 PM Version By: [name] EMT Previous Signature: 05/19/2022 2:57:20 PM Version By: [name] DO Entered By: [name] on 05/20/2022 12:31:21 [name] ([id number]) [id number] Page 2 of 2 -------------------------------------------------------------------------------- HBO Safety Checklist Details Patient Name: Date of Service: [name]. 05/19/2022 10:00 A M Medical Record Number: [id number] Patient Account Number: [id number] Date of Birth/Sex: Treating RN: 1965/09/08 (56 y.o. [name] Primary Care Tynisha Ogan: [name] Other Clinician: [name] Referring Rayshun Kandler: Treating Letonia Stead/Extender: [name] in Treatment: 7 [name] Safety Checklist Consent Form Signed Patient voided / foley secured and emptied When did you last eato 0700 Last dose of injectable or oral agent 0745 Ostomy pouch emptied and vented if applicable NA All implantable devices assessed, documented and approved NA Intravenous access site secured and place NA Valuables secured Linens and cotton and cotton/polyester blend (less than 51% polyester) Personal oil-based products / skin lotions / body lotions removed Wigs or hairpieces removed NA Smoking or tobacco materials removed Books / newspapers / magazines / loose paper removed Cologne, aftershave, perfume and  deodorant removed Jewelry removed (may wrap wedding band) Make-up removed NA Hair care products removed Battery operated devices (external) removed Heating patches and chemical warmers removed Titanium eyewear removed NA Nail polish cured greater than 10 hours NA Casting  material cured greater than 10 hours NA Hearing aids removed NA Loose dentures or partials removed NA Prosthetics have been removed NA Patient demonstrates correct use of air break device (if applicable) Patient concerns have been addressed Patient grounding bracelet on and cord attached to chamber Specifics for Inpatients (complete in addition to above) Medication sheet sent with patient NA Intravenous medications needed or due during therapy sent with patient NA Drainage tubes ([name].g. nasogastric tube or chest tube secured and vented) NA Endotracheal or Tracheotomy tube secured NA Cuff deflated of air and inflated with saline NA Airway suctioned NA Notes The safety check was done before the treatment was started. Electronic Signature(s) Signed: 05/19/2022 2:01:39 PM By: [name] EMT Entered By: [name] on 05/19/2022 14:01:39

## 2022-05-20 ENCOUNTER — Encounter (HOSPITAL_COMMUNITY): Payer: Self-pay

## 2022-05-20 ENCOUNTER — Emergency Department (HOSPITAL_COMMUNITY)
Admission: EM | Admit: 2022-05-20 | Discharge: 2022-05-20 | Disposition: A | Discharge status: Home / Self Care | Payer: BC Managed Care – PPO | Attending: Emergency Medicine | Admitting: Emergency Medicine

## 2022-05-20 ENCOUNTER — Encounter (HOSPITAL_BASED_OUTPATIENT_CLINIC_OR_DEPARTMENT_OTHER): Payer: BC Managed Care – PPO | Admitting: General Surgery

## 2022-05-20 DIAGNOSIS — Z79899 Other long term (current) drug therapy: Secondary | ICD-10-CM | POA: Diagnosis not present

## 2022-05-20 DIAGNOSIS — R000 Tachycardia, unspecified: Secondary | ICD-10-CM | POA: Diagnosis not present

## 2022-05-20 DIAGNOSIS — R339 Retention of urine, unspecified: Secondary | ICD-10-CM | POA: Insufficient documentation

## 2022-05-20 DIAGNOSIS — Z8546 Personal history of malignant neoplasm of prostate: Secondary | ICD-10-CM | POA: Diagnosis not present

## 2022-05-20 DIAGNOSIS — E119 Type 2 diabetes mellitus without complications: Secondary | ICD-10-CM | POA: Diagnosis not present

## 2022-05-20 DIAGNOSIS — I10 Essential (primary) hypertension: Secondary | ICD-10-CM | POA: Insufficient documentation

## 2022-05-20 DIAGNOSIS — R319 Hematuria, unspecified: Secondary | ICD-10-CM | POA: Insufficient documentation

## 2022-05-20 DIAGNOSIS — Z794 Long term (current) use of insulin: Secondary | ICD-10-CM | POA: Diagnosis not present

## 2022-05-20 DIAGNOSIS — N3041 Irradiation cystitis with hematuria: Secondary | ICD-10-CM | POA: Diagnosis not present

## 2022-05-20 LAB — BASIC METABOLIC PANEL
Anion gap: 12 (ref 5–15)
BUN: 25 mg/dL — ABNORMAL HIGH (ref 6–20)
CO2: 20 mmol/L — ABNORMAL LOW (ref 22–32)
Calcium: 9.6 mg/dL (ref 8.9–10.3)
Chloride: 101 mmol/L (ref 98–111)
Creatinine, Ser: 1.17 mg/dL (ref 0.61–1.24)
GFR, Estimated: >60 mL/min (ref >60)
Glucose, Bld: 306 mg/dL — ABNORMAL HIGH (ref 70–99)
Potassium: 4.2 mmol/L (ref 3.5–5.1)
Sodium: 133 mmol/L — ABNORMAL LOW (ref 135–145)

## 2022-05-20 LAB — CBC WITH DIFFERENTIAL/PLATELET
Abs Immature Granulocytes: 0.02 10*3/uL (ref 0.00–0.07)
Basophils Absolute: 0 10*3/uL (ref 0.0–0.1)
Basophils Relative: 0 %
Eosinophils Absolute: 0.2 10*3/uL (ref 0.0–0.5)
Eosinophils Relative: 2 %
HCT: 28.1 % — ABNORMAL LOW (ref 39.0–52.0)
Hemoglobin: 9.6 g/dL — ABNORMAL LOW (ref 13.0–17.0)
Immature Granulocytes: 0 %
Lymphocytes Relative: 4 %
Lymphs Abs: 0.4 10*3/uL — ABNORMAL LOW (ref 0.7–4.0)
MCH: 32.1 pg (ref 26.0–34.0)
MCHC: 34.2 g/dL (ref 30.0–36.0)
MCV: 94 fL (ref 80.0–100.0)
Monocytes Absolute: 1.1 10*3/uL — ABNORMAL HIGH (ref 0.1–1.0)
Monocytes Relative: 11 %
Neutro Abs: 8.4 10*3/uL — ABNORMAL HIGH (ref 1.7–7.7)
Neutrophils Relative %: 83 %
Platelets: 445 10*3/uL — ABNORMAL HIGH (ref 150–400)
RBC: 2.99 MIL/uL — ABNORMAL LOW (ref 4.22–5.81)
RDW: 14.8 % (ref 11.5–15.5)
WBC: 10.2 10*3/uL (ref 4.0–10.5)
nRBC: 0 % (ref 0.0–0.2)

## 2022-05-20 LAB — GLUCOSE, CAPILLARY
Glucose-Capillary: 184 mg/dL — ABNORMAL HIGH (ref 70–99)
Glucose-Capillary: 157 mg/dL — ABNORMAL HIGH (ref 70–99)

## 2022-05-20 MED ORDER — MORPHINE SULFATE (PF) 4 MG/ML IV SOLN
4.0000 mg | Freq: Once | INTRAVENOUS | Status: AC
  Administered 2022-05-20: 4 mg via INTRAVENOUS
  Filled 2022-05-20: qty 1

## 2022-05-20 MED ORDER — HYDROMORPHONE HCL 1 MG/ML IJ SOLN
0.5000 mg | Freq: Once | INTRAMUSCULAR | Status: AC
  Administered 2022-05-20: 0.5 mg via INTRAVENOUS
  Filled 2022-05-20: qty 1

## 2022-05-20 NOTE — Discharge Instructions (Signed)
Follow up with urology tomorrow. Return to ED for severe pain or new symptoms.

## 2022-05-20 NOTE — ED Notes (Signed)
Bladder scan 192m.

## 2022-05-20 NOTE — Consult Note (Signed)
Urology Consult   Physician requepsting consult: Ezequiel Essex, MD  Reason for consult: gross hematuria, clot retention  History of Present Illness: [name] is a 56 y.o. 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 Patient has an extensive history of hematuria and clot retention dating back over the past 2 months. He underwent clot evac and fulguration on 03/21/22. He more recently was seen by Dr. Alinda Money in office this afternoon, 05/20/22, where an 16F 2-way catheter was placed for retention (bladder scan ~300cc).   He reports his urine gradually darkened over the afternoon and by 5pm he noticed suprapubic pressure and minimal output in his catheter drainage bag. He attempted to manually irrigate the catheter with little success and ultimately removed his catheter himself. He then was unable to urinate, which prompted his presentation to the ED.  On arrival to the ED, patient was in noticeable distress with significant bladder spasms. He was bladder scanned by ED for ~180cc, though this was reportedly not likely to be accurate. On my exam, he was uncomfortable with suprapubic tenderness so I opted to place a hematuria catheter.  A 22Fr 3-way hematuria catheter was placed with return of ~500cc dark red urine. He was manually irrigated with return of ~150cc old dark clot. He continued to have bladder spasms, and at one point had such a significant spasm that he expelled his catheter along with several 3-4cm clots and ~200-300cc light pink urine. The catheter was inspected and balloon was intact, though it had 2 small holes that leaked with water instillation.   Subsequently, patient felt much better and suprapubic tenderness had resolved. Bladder scan following his last spasm was ~130cc and he thereafter spontaneously voided ~100cc light pink urine without clot.  Past Medical  History:  Diagnosis Date   Asthma    Cancer ([place])    prostate cancer    Diabetes mellitus without complication ([place])    History of bronchitis    Hypertension    Prostate cancer ([place])    Wears glasses     Past Surgical History:  Procedure Laterality Date   CYSTOSCOPY WITH FULGERATION N/A 03/21/2022   Procedure: CYSTOSCOPY WITH FULGERATION;  Surgeon: Janith Lima, MD;  Location: [facility];  Service: Urology;  Laterality: N/A;   EYE SURGERY     laser eye surgery bilat    left groin hernia      LYMPHADENECTOMY Bilateral 09/02/2015   Procedure: BILATERAL LYMPHADENECTOMY;  Surgeon: Raynelle Bring, MD;  Location: [facility];  Service: Urology;  Laterality: Bilateral;   ROBOT ASSISTED LAPAROSCOPIC RADICAL PROSTATECTOMY N/A 09/02/2015   Procedure: XI ROBOTIC ASSISTED LAPAROSCOPIC RADICAL PROSTATECTOMY LEVEL 2;  Surgeon: Raynelle Bring, MD;  Location: [facility];  Service: Urology;  Laterality: N/A;   schwannoma     removed approx 16 to 18 years ago    [facility] Medications:  Home Meds:  No current facility-administered medications on file prior to encounter.   Current Outpatient Medications on File Prior to Encounter  Medication Sig Dispense Refill   ALPRAZolam (XANAX) 0.25 MG tablet Take 0.25 mg by mouth 3 (three) times daily as needed.     amLODipine (NORVASC) 5 MG tablet Take 5 mg by mouth daily.     Continuous Blood Gluc Sensor (FREESTYLE LIBRE 2 SENSOR) MISC USE TO MONITOR CBG EVERY 14 DAYS AS DIRECTED  Insulin Pen Needle (B-D UF III MINI PEN NEEDLES) 31G X 5 MM MISC USE TO INJECT INSULIN 4 TIMES DAILY E10.9     Insulin Syringe-Needle U-100 (B-D INS SYRINGE 0.5CC/31GX5/16) 31G X 5/16" 0.5 ML MISC use 1 syringe as directed (does 5 injections a day     LYUMJEV TEMPO PEN 100 UNIT/ML SOPN Inject into the skin.     ramipril (ALTACE) 10 MG capsule Take 10 mg by mouth daily.     rosuvastatin (CRESTOR) 5 MG tablet Take 5 mg by mouth. Twice a week     TRESIBA FLEXTOUCH  200 UNIT/ML SOPN Inject 36 Units into the skin every morning.      XTANDI 40 MG tablet Take 160 mg by mouth at bedtime.       Scheduled Meds: Continuous Infusions: PRN Meds:.  Allergies: No Known Allergies  History reviewed. No pertinent family history.  Social History:  reports that he quit smoking about 23 years ago. His smoking use included cigarettes. He has a 3.50 pack-year smoking history. He has never used smokeless tobacco. He reports current alcohol use. He reports that he does not use drugs.  ROS: A complete review of systems was performed.  All systems are negative except for pertinent findings as noted.  Physical Exam:  Vital signs in last 24 hours: Temp:  [98.8 F (37.1 C)] 98.8 F (37.1 C) (12/06 1832) Pulse Rate:  [102-116] 102 (12/06 2039) Resp:  [18] 18 (12/06 2039) BP: (193-206)/(87-133) 193/87 (12/06 2039) SpO2:  [99 %-100 %] 100 % (12/06 2039) Constitutional:  Alert and oriented, No acute distress Cardiovascular: Regular rate and rhythm, No JVD Respiratory: Normal respiratory effort, Lungs clear bilaterally GI: Abdomen is soft, nontender, nondistended, no abdominal masses GU: Urine light pink, voiding spontaneously Lymphatic: No lymphadenopathy Neurologic: Grossly intact, no focal deficits Psychiatric: Normal mood and affect  Laboratory Data:  Recent Labs    05/20/22 1945  WBC 10.2  HGB 9.6*  HCT 28.1*  PLT 445*    Recent Labs    05/20/22 1945  NA 133*  K 4.2  CL 101  GLUCOSE 306*  BUN 25*  CALCIUM 9.6  CREATININE 1.17     Results for orders placed or performed during the hospital encounter of 05/20/22 (from the past 24 hour(s))  CBC with Differential     Status: Abnormal   Collection Time: 05/20/22  7:45 PM  Result Value Ref Range   WBC 10.2 4.0 - 10.5 K/uL   RBC 2.99 (L) 4.22 - 5.81 MIL/uL   Hemoglobin 9.6 (L) 13.0 - 17.0 g/dL   HCT 28.1 (L) 39.0 - 52.0 %   MCV 94.0 80.0 - 100.0 fL   MCH 32.1 26.0 - 34.0 pg   MCHC 34.2 30.0 -  36.0 g/dL   RDW 14.8 11.5 - 15.5 %   Platelets 445 (H) 150 - 400 K/uL   nRBC 0.0 0.0 - 0.2 %   Neutrophils Relative % 83 %   Neutro Abs 8.4 (H) 1.7 - 7.7 K/uL   Lymphocytes Relative 4 %   Lymphs Abs 0.4 (L) 0.7 - 4.0 K/uL   Monocytes Relative 11 %   Monocytes Absolute 1.1 (H) 0.1 - 1.0 K/uL   Eosinophils Relative 2 %   Eosinophils Absolute 0.2 0.0 - 0.5 K/uL   Basophils Relative 0 %   Basophils Absolute 0.0 0.0 - 0.1 K/uL   Immature Granulocytes 0 %   Abs Immature Granulocytes 0.02 0.00 - 0.07 K/uL  Basic metabolic panel  Status: Abnormal   Collection Time: 05/20/22  7:45 PM  Result Value Ref Range   Sodium 133 (L) 135 - 145 mmol/L   Potassium 4.2 3.5 - 5.1 mmol/L   Chloride 101 98 - 111 mmol/L   CO2 20 (L) 22 - 32 mmol/L   Glucose, Bld 306 (H) 70 - 99 mg/dL   BUN 25 (H) 6 - 20 mg/dL   Creatinine, Ser 1.17 0.61 - 1.24 mg/dL   Calcium 9.6 8.9 - 10.3 mg/dL   GFR, Estimated >60 >60 mL/min   Anion gap 12 5 - 15   No results found for this or any previous visit (from the past 240 hour(s)).  Renal Function: Recent Labs    05/20/22 1945  CREATININE 1.17   CrCl cannot be calculated (Unknown ideal weight.).  Radiologic Imaging: No results found.  I independently reviewed the above imaging studies.  Procedure: Foley Catheter Placement Note  Indications: 56 y.o. male with clot retention and hematuria  Pre-operative Diagnosis: Urinary retention, hematuria  Post-operative Diagnosis: Same  Surgeon: Lamar Laundry, MD  Assistants: None  Procedure Details  Patient was placed in the supine position, prepped with Betadine and draped in the usual sterile fashion.  We injected lidocaine jelly per urethra prior to the procedure.  We then inserted a 22 [place] 3-way hematuria catheter per urethra which easily passed into the bladder without any resistance at the prostatic urethra.  We achieved return of clear yellow urine and then proceeded to insert 20 mL of sterile water  into the Foley balloon.  The catheter was attached to a drainage bag and secured with a StatLock.  Placement of the catheter had return of greater than 500 mL of dark red urine.   As per HPI, catheter was manually irrigated with return of ~150cc old dark clot. Patient thereafter had a forceful bladder spasm that expelled his catheter along with several 3-4cm clots and ~200cc light pink urine. Bladder scan afterwards ~130cc.   Impression/Recommendation Radiation cystitis c/b gross hematuria and clot retention  Patient, his wife, and I had an extensive discussion about the options after he expelled his 3-way catheter 2/2 bladder spasm. It appeared that he had completely emptied his bladder free of clot and was thereafter voiding light pink urine spontaneously. We reviewed the options in light of his scheduled appointment with Dr. Alinda Money tomorrow, 05/21/22.  Patient was offered a trial of void versus replacement of catheter. We discussed the catheter would allow [place] to closer monitor the urine, though it could stir up more bleeding secondary to his hostile radiated bladder. We discussed a trial without a catheter would allow for bladder and urethral rest, though would put him at risk of going into retention and returning to the ED. Ultimately, after going back and forth for a bit, patient voided spontaneously and felt best to go without a catheter. I think this is reasonable. He will discharge home from the ED with plans to return to clinic tomorrow to see Dr. Alinda Money. He was provided strict return precautions, and he was urged to keep PO intake to a reasonable but minimal amount to prevent expediting bladder filling. He was also encouraged to avoid bladder irritants such as coffee in the morning. All questions were answered.  Legrand Como Lavoris Sparling 05/20/2022, 9:58 PM

## 2022-05-20 NOTE — ED Triage Notes (Signed)
Pt arrived via POV, c/o ongoing urology issues. Had catheter in place. Removed this morning after large clots. Spoke with urology and advised to come to ED

## 2022-05-20 NOTE — Progress Notes (Signed)
MATHEUS, SPIKER ([id number]) [id number] Page 1 of 2 Visit Report for 05/20/2022 Arrival Information Details Patient Name: Date of Service: [name] MES E. 05/20/2022 10:00 A M Medical Record Number: [id number] Patient Account Number: [id number] Date of Birth/Sex: Treating RN: 1966-02-20 (56 y.o. [name], Meta.Reding Primary Care Huel Centola: [name] Other Clinician: [name] Referring Kayleann Mccaffery: Treating Janiesha Diehl/Extender: [name] in Treatment: 7 Visit Information History Since Last Visit All ordered tests and consults were completed: Yes Patient Arrived: Ambulatory Added or deleted any medications: No Arrival Time: 09:30 Any new allergies or adverse reactions: No Accompanied By: None Had a fall or experienced change in No Transfer Assistance: None activities of daily living that may affect Patient Identification Verified: Yes risk of falls: Secondary Verification Process Completed: Yes Signs or symptoms of abuse/neglect since last visito No Patient Requires Transmission-Based Precautions: No Hospitalized since last visit: No Patient Has Alerts: No Implantable device outside of the clinic excluding No cellular tissue based products placed in the center since last visit: Pain Present Now: Yes Electronic Signature(s) Signed: 05/20/2022 2:12:44 PM By: [name] EMT Entered By: [name] on 05/20/2022 14:12:44 -------------------------------------------------------------------------------- Encounter Discharge Information Details Patient Name: Date of Service: [name] MES E. 05/20/2022 10:00 A M Medical Record Number: [id number] Patient Account Number: [id number] Date of Birth/Sex: Treating RN: 1965/12/04 (56 y.o. [name] Primary Care Dannika Hilgeman: [name] Other Clinician: [name] Referring Cuma Polyakov: Treating Teryn Gust/Extender: [name] in Treatment:  7 Encounter Discharge Information Items Discharge Condition: Stable Ambulatory Status: Ambulatory Discharge Destination: Home Transportation: Private Auto Accompanied By: None Schedule Follow-up Appointment: Yes Clinical Summary of Care: Electronic Signature(s) Signed: 05/20/2022 2:16:24 PM By: [name] EMT Entered By: [name] on 05/20/2022 14:16:23 [name] ([id number]) [id number] Page 2 of 2 -------------------------------------------------------------------------------- Vitals Details Patient Name: Date of Service: [name] MES E. 05/20/2022 10:00 A M Medical Record Number: [id number] Patient Account Number: [id number] Date of Birth/Sex: Treating RN: 1966-02-13 (56 y.o. [name] Primary Care Opal Mckellips: [name] Other Clinician: [name] Referring Jaquasia Doscher: Treating Tamario Heal/Extender: [name] in Treatment: 7 Vital Signs Time Taken: 09:57 Temperature (F): 97.2 Height (in): 70 Pulse (bpm): 89 Weight (lbs): 210 Respiratory Rate (breaths/min): 16 Body Mass Index (BMI): 30.1 Blood Pressure (mmHg): 138/68 Capillary Blood Glucose (mg/dl): 184 Reference Range: 80 - 120 mg / dl Electronic Signature(s) Signed: 05/20/2022 2:13:15 PM By: [name] EMT Entered By: [name] on 05/20/2022 14:13:15

## 2022-05-20 NOTE — ED Provider Triage Note (Signed)
Emergency Medicine Provider Triage Evaluation Note  [name] , a 56 y.o. male  was evaluated in triage.  Pt complains of urinary retention x1 day.  Patient has prostate cancer undergoing radiation, states has been unable to urinate.  Requires frequent catheterizations, currently not catheterized.  States he is hard to arise and requesting urology physician..  Review of Systems  Per HPI  Physical Exam  BP (!) 206/133 (BP Location: Left Arm)   Pulse (!) 116   Temp 98.8 F (37.1 C) (Oral)   Resp 18   SpO2 99%  Gen:   Awake, no distress   Resp:  Normal effort  MSK:   Moves extremities without difficulty  Other:  Tachycardic, abdomen is distended  Medical Decision Making  Medically screening exam initiated at 6:56 PM.  Appropriate orders placed.  JAYCEION LISENBY was informed that the remainder of the evaluation will be completed by another provider, this initial triage assessment does not replace that evaluation, and the importance of remaining in the ED until their evaluation is complete.  Labs, will bladder scan.    Sherrill Raring, PA-C 05/20/22 1857

## 2022-05-20 NOTE — ED Provider Notes (Signed)
[facility] DEPT Provider Note   CSN: [id number] Arrival date & time: 05/20/22  1823     History  Chief Complaint  Patient presents with   Hematuria    [name] is a 56 y.o. male.   Hematuria     Patient with medical diabetes, hypertension, prostate cancer presents to the emergency department due to urinary retention.  Patient is required multiple catheterizations, heart cath secondary to prostate size.  He had radiation earlier today, has been unable to urinate since then.  Denies any fevers, chills, nausea or vomiting.  He does endorse abdominal pain secondary to not being able to urinate.  Home Medications Prior to Admission medications   Medication Sig Start Date End Date Taking? Authorizing Provider  ALPRAZolam (XANAX) 0.25 MG tablet Take 0.25 mg by mouth 3 (three) times daily as needed. 03/17/22   [provider]  amLODipine (NORVASC) 5 MG tablet Take 5 mg by mouth daily. 06/23/21   [provider]  Continuous Blood Gluc Sensor (FREESTYLE LIBRE 2 SENSOR) MISC USE TO MONITOR CBG EVERY 14 DAYS AS DIRECTED 06/11/21   [provider]  Insulin Pen Needle (B-D UF III MINI PEN NEEDLES) 31G X 5 MM MISC USE TO INJECT INSULIN 4 TIMES DAILY E10.9 01/22/16   [provider]  Insulin Syringe-Needle U-100 (B-D INS SYRINGE 0.5CC/31GX5/16) 31G X 5/16" 0.5 ML MISC use 1 syringe as directed (does 5 injections a day 02/04/10   [provider]  LYUMJEV TEMPO PEN 100 UNIT/ML SOPN Inject into the skin. 02/05/22   [provider]  ramipril (ALTACE) 10 MG capsule Take 10 mg by mouth daily.    [provider]  rosuvastatin (CRESTOR) 5 MG tablet Take 5 mg by mouth. Twice a week    [provider]  TRESIBA FLEXTOUCH 200 UNIT/ML SOPN Inject 36 Units into the skin every morning.  07/25/15   [provider]  XTANDI 40 MG tablet Take 160 mg by mouth at bedtime. 03/04/22   [provider]       Allergies    Patient has no known allergies.    Review of Systems   Review of Systems  Genitourinary:  Positive for hematuria.    Physical Exam Updated Vital Signs BP (!) 186/78 (BP Location: Left Arm)   Pulse 98   Temp 98.8 F (37.1 C) (Oral)   Resp 16   SpO2 98%  Physical Exam Vitals and nursing note reviewed. Exam conducted with a chaperone present.  Constitutional:      Appearance: Normal appearance.  HENT:     Head: Normocephalic and atraumatic.  Eyes:     General: No scleral icterus.       Right eye: No discharge.        Left eye: No discharge.     Extraocular Movements: Extraocular movements intact.     Pupils: Pupils are equal, round, and reactive to light.  Cardiovascular:     Rate and Rhythm: Regular rhythm. Tachycardia present.     Pulses: Normal pulses.     Heart sounds: Normal heart sounds. No murmur heard.    No friction rub. No gallop.  Pulmonary:     Effort: Pulmonary effort is normal. No respiratory distress.     Breath sounds: Normal breath sounds.  Abdominal:     General: Abdomen is flat. Bowel sounds are normal. There is distension.     Palpations: Abdomen is soft.     Tenderness: There is no  abdominal tenderness.     Comments: No reproducible tenderness but abdomen is distended  Skin:    General: Skin is warm and dry.     Coloration: Skin is not jaundiced.  Neurological:     Mental Status: He is alert. Mental status is at baseline.     Coordination: Coordination normal.     ED Results / Procedures / Treatments   Labs (all labs ordered are listed, but only abnormal results are displayed) Labs Reviewed  CBC WITH DIFFERENTIAL/PLATELET - Abnormal; Notable for the following components:      Result Value   RBC 2.99 (*)    Hemoglobin 9.6 (*)    HCT 28.1 (*)    Platelets 445 (*)    Neutro Abs 8.4 (*)    Lymphs Abs 0.4 (*)    Monocytes Absolute 1.1 (*)    All other components within normal limits  BASIC METABOLIC PANEL - Abnormal;  Notable for the following components:   Sodium 133 (*)    CO2 20 (*)    Glucose, Bld 306 (*)    BUN 25 (*)    All other components within normal limits  URINE CULTURE  URINALYSIS, ROUTINE W REFLEX MICROSCOPIC    EKG None  Radiology No results found.  Procedures Procedures    Medications Ordered in ED Medications  morphine (PF) 4 MG/ML injection 4 mg (4 mg Intravenous Given 05/20/22 2018)  HYDROmorphone (DILAUDID) injection 0.5 mg (0.5 mg Intravenous Given 05/20/22 2039)    ED Course/ Medical Decision Making/ A&P                            Medical Decision Making Amount and/or Complexity of Data Reviewed Labs: ordered. Decision-making details documented in ED Course.  Risk Prescription drug management.   This is a 56 year old male with history of prostate cancer and frequent catheterization presenting to the ED due to urinary retention x1 day.  Differential includes but not limited to obstructive uropathy, nephrolithiasis, AKI.  Patient's wife is at bedside providing independent history.  I also reviewed external medical records including recent urology visit.  Patient is obstructed likely secondary to prostate malignancy.  I consulted with urology given patient is a challenging catheterization secondary to his chronic comorbidity and requested urology perform the cath.  The on-call urology resident Dr. Barbee Cough is in agreement to see the patient and insert foley.   I ordered and reviewed laboratory work-up.   -CBC without leukocytosis, patient is anemic with baseline hemoglobin of 9.6.   -BMP without gross electrolyte derangement or AKI.  I ordered Dilaudid and morphine for the patient's pain.  On reassessment pain is improved.  Per Urology patient had a catheter in place but it was dislodged with passage of 8 cm clot. Patient is being clear blood, offered catheterization but declined. He has urology follow up tomorrow at 9 am. Admission was considered but no AKI,  leukocytosis and he has excellent urology follow up and agreement with this plan. Patient also would prefer to go home and see his urologist in the morning. Patient is stable for DC at thist ime.         Final Clinical Impression(s) / ED Diagnoses Final diagnoses:  Urinary retention    Rx / DC Orders ED Discharge Orders     None         Sherrill Raring, Hershal Coria 05/20/22 2229    Ezequiel Essex, MD 05/21/22 [id number]

## 2022-05-21 ENCOUNTER — Encounter (HOSPITAL_BASED_OUTPATIENT_CLINIC_OR_DEPARTMENT_OTHER): Payer: BC Managed Care – PPO | Admitting: General Surgery

## 2022-05-21 DIAGNOSIS — N3041 Irradiation cystitis with hematuria: Secondary | ICD-10-CM | POA: Diagnosis not present

## 2022-05-21 LAB — GLUCOSE, CAPILLARY
Glucose-Capillary: 274 mg/dL — ABNORMAL HIGH (ref 70–99)
Glucose-Capillary: 223 mg/dL — ABNORMAL HIGH (ref 70–99)

## 2022-05-21 NOTE — Progress Notes (Signed)
[name] ([id number]) [id number] Page 1 of 2 Visit Report for 05/21/2022 Arrival Information Details Patient Name: Date of Service: [name] MES E. 05/21/2022 10:00 A M Medical Record Number: [id number] Patient Account Number: [id number] Date of Birth/Sex: Treating RN: 05/09/1966 (56 y.o. [name] Primary Care Shalicia Craghead: [name] Other Clinician: [name] Referring Ronalda Walpole: Treating Travia Onstad/Extender: [name] in Treatment: 7 Visit Information History Since Last Visit All ordered tests and consults were completed: Yes Patient Arrived: Ambulatory Added or deleted any medications: No Arrival Time: 09:23 Any new allergies or adverse reactions: No Accompanied By: None Had a fall or experienced change in No Transfer Assistance: None activities of daily living that may affect Patient Identification Verified: Yes risk of falls: Secondary Verification Process Completed: Yes Signs or symptoms of abuse/neglect since last visito No Patient Requires Transmission-Based Precautions: No Hospitalized since last visit: Yes Patient Has Alerts: No Implantable device outside of the clinic excluding No cellular tissue based products placed in the center since last visit: Pain Present Now: Yes Notes See ED scan notes. Electronic Signature(s) Signed: 05/21/2022 12:35:42 PM By: [name] EMT Previous Signature: 05/21/2022 12:22:16 PM Version By: [name] EMT Entered By: [name] on 05/21/2022 12:35:42 -------------------------------------------------------------------------------- Encounter Discharge Information Details Patient Name: Date of Service: [name] MES E. 05/21/2022 10:00 A M Medical Record Number: [id number] Patient Account Number: [id number] Date of Birth/Sex: Treating RN: 1965/09/18 (56 y.o. [name] Primary Care Kendrea Cerritos: [name] Other Clinician: [name] Referring Elta Angell: Treating Titan Karner/Extender: [name] in Treatment: 7 Encounter Discharge Information Items Discharge Condition: Stable Ambulatory Status: Ambulatory Discharge Destination: Home Transportation: Private Auto Accompanied By: None Schedule Follow-up Appointment: Yes Clinical Summary of Care: Electronic Signature(s) Signed: 05/21/2022 12:52:35 PM By: [name] EMT Entered By: [name] on 05/21/2022 12:52:35 [name] ([id number]) [id number] Page 2 of 2 -------------------------------------------------------------------------------- Vitals Details Patient Name: Date of Service: [name] MES E. 05/21/2022 10:00 A M Medical Record Number: [id number] Patient Account Number: [id number] Date of Birth/Sex: Treating RN: 1965-12-31 (56 y.o. [name] Primary Care Alixandra Alfieri: [name] Other Clinician: [name] Referring Irelyn Perfecto: Treating Hinda Lindor/Extender: [name] in Treatment: 7 Vital Signs Time Taken: 09:39 Temperature (F): 97.6 Height (in): 70 Pulse (bpm): 91 Weight (lbs): 210 Respiratory Rate (breaths/min): 18 Body Mass Index (BMI): 30.1 Blood Pressure (mmHg): 140/57 Capillary Blood Glucose (mg/dl): 274 Reference Range: 80 - 120 mg / dl Electronic Signature(s) Signed: 05/21/2022 12:45:53 PM By: [name] EMT Entered By: [name] on 05/21/2022 12:45:53

## 2022-05-21 NOTE — Progress Notes (Signed)
PERICLES, CARMICHEAL ([id number]) [id number] Page 1 of 2 Visit Report for 05/20/2022 Problem List Details Patient Name: Date of Service: Dillon Dun MES E. 05/20/2022 10:00 A M Medical Record Number: [id number] Patient Account Number: [id number] Date of Birth/Sex: Treating RN: 11/29/1965 (56 y.o. 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 05/20/2022 Medical Record Number: [id number] Patient Account Number: [id number] Date of Birth/Sex: Treating RN: [birth year] (56 y.o. [name] Primary Care Provider: [name] Other Clinician: [name] Referring Provider: Treating Provider/Extender: [name] in Treatment: 7 Diagnosis Coding ICD-10 Codes Code Description N30.41 Irradiation cystitis with hematuria JUNO, BOZARD ([id number]) [id number] Page 2 of 2 R31.9 Hematuria, unspecified C61 Malignant neoplasm of prostate E10.3529 Type 1 diabetes mellitus with proliferative diabetic retinopathy  with traction retinal detachment involving the macula, unspecified eye Facility Procedures : CPT4 Code Description: [id number] G0277-(Facility Use Only) HBOT full body chamber, 76mn , ICD-10 Diagnosis Description 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 Hematuria, unspecified C61 Malignant neoplasm of prostate E10.3529 Type 1 diabetes  mellitus with proliferative diabetic retinopathy with traction retinal unspecified eye Modifier: detachment involvin Quantity: 4 g the macula, Physician Procedures : CPT4 Code Description Modifier [id number]- WC PHYS HYPERBARIC OXYGEN THERAPY ICD-10 Diagnosis Description 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 Hematuria, unspecified C61 Malignant neoplasm of prostate E10.3529 Type 1 diabetes mellitus  with proliferative diabetic retinopathy with traction retinal detachment involvin unspecified eye Quantity: 1 g the macula, Electronic Signature(s) Signed: 05/20/2022 2:15:44 PM By: GValeria BatmanEMT Signed: 05/20/2022 5:07:18 PM By: CFredirick MaudlinMD FACS Entered By: GValeria Batmanon 05/20/2022 14:15:43

## 2022-05-21 NOTE — Progress Notes (Signed)
[name], SCHURMAN ([id number]) [id number] Page 1 of 2 Visit Report for 05/20/2022 HBO Details Patient Name: Date of Service: [name] MES E. 05/20/2022 10:00 A M Medical Record Number: [id number] Patient Account Number: [id number] Date of Birth/Sex: Treating RN: 22-Jul-1965 (56 y.o. [name] Primary Care Octavia Mottola: [name] Other Clinician: [name] Referring Ada Holness: Treating Lametria Klunk/Extender: [name] in Treatment: 7 HBO Treatment Course Details Treatment Course Number: 1 Ordering Maxemiliano Riel: [name] T Treatments Ordered: otal 40 HBO Treatment Start Date: 04/09/2022 HBO Indication: Late Effect of Radiation HBO Treatment Details Treatment Number: 26 Patient Type: Outpatient Chamber Type: Monoplace Chamber Serial #: [id number] Treatment Protocol: 2.0 ATA with 90 minutes oxygen, and no air breaks Treatment Details Compression Rate Down: 2.0 psi / minute De-Compression Rate Up: 2.0 psi / minute Air breaks and breathing Decompress Decompress Compress Tx Pressure Begins Reached periods Begins Ends (leave unused spaces blank) Chamber Pressure (ATA 1 2 ------2 1 ) Clock Time (24 hr) 10:07 10:16 - - - - - - 11:46 11:53 Treatment Length: 106 (minutes) Treatment Segments: 4 Vital Signs Capillary Blood Glucose Reference Range: 80 - 120 mg / dl HBO Diabetic Blood Glucose Intervention Range: <131 mg/dl or >249 mg/dl Time Vitals Blood Respiratory Capillary Blood Glucose Pulse Action Type: Pulse: Temperature: Taken: Pressure: Rate: Glucose (mg/dl): Meter #: Oximetry (%) Taken: Pre 09:57 138/68 89 16 97.2 184 Post 11:59 168/93 90 20 97.7 157 Treatment Response Treatment Toleration: Well Treatment Completion Status: Treatment Completed without Adverse Event Physician HBO Attestation: I certify that I supervised this HBO treatment in accordance with Medicare guidelines. A trained emergency response  team is readily available per Yes hospital policies and procedures. Continue HBOT as ordered. Yes Electronic Signature(s) Signed: 05/20/2022 5:04:45 PM By: [name] MD FACS Previous Signature: 05/20/2022 2:15:20 PM Version By: [name] EMT Entered By: [name] on 05/20/2022 17:04:44 [name] ([id number]) [id number] Page 2 of 2 -------------------------------------------------------------------------------- HBO Safety Checklist Details Patient Name: Date of Service: [name] MES E. 05/20/2022 10:00 A M Medical Record Number: [id number] Patient Account Number: [id number] Date of Birth/Sex: Treating RN: [birth year] (56 y.o. [name], Meta.Reding Primary Care Maddux Vanscyoc: [name] Other Clinician: [name] Referring Daksha Koone: Treating Maryclare Nydam/Extender: [name] in Treatment: 7 [name] Safety Checklist Consent Form Signed Patient voided / foley secured and emptied When did you last eato 0730 Last dose of injectable or oral agent 0800 Ostomy pouch emptied and vented if applicable NA All implantable devices assessed, documented and approved NA Intravenous access site secured and place NA Valuables secured Linens and cotton and cotton/polyester blend (less than 51% polyester) Personal oil-based products / skin lotions / body lotions removed Wigs or hairpieces removed NA Smoking or tobacco materials removed Books / newspapers / magazines / loose paper removed Cologne, aftershave, perfume and deodorant removed Jewelry removed (may wrap wedding band) Make-up removed NA Hair care products removed Battery operated devices (external) removed Heating patches and chemical warmers removed Titanium eyewear removed NA Nail polish cured greater than 10 hours NA Casting material cured greater than 10 hours NA Hearing aids removed NA Loose dentures or partials removed NA Prosthetics  have been removed NA Patient demonstrates correct use of air break device (if applicable) Patient concerns have been addressed Patient grounding bracelet on and cord attached to chamber Specifics for Inpatients (complete in addition to above) Medication sheet sent with patient NA Intravenous medications needed or due during therapy  sent with patient NA Drainage tubes (e.g. nasogastric tube or chest tube secured and vented) NA Endotracheal or Tracheotomy tube secured NA Cuff deflated of air and inflated with saline NA Airway suctioned NA Notes The safety check was done before the treatment was started. Electronic Signature(s) Signed: 05/20/2022 2:14:14 PM By: [name] EMT Entered By: [name] on 05/20/2022 14:14:13

## 2022-05-22 ENCOUNTER — Encounter (HOSPITAL_BASED_OUTPATIENT_CLINIC_OR_DEPARTMENT_OTHER): Payer: BC Managed Care – PPO | Admitting: Internal Medicine

## 2022-05-22 DIAGNOSIS — R319 Hematuria, unspecified: Secondary | ICD-10-CM | POA: Diagnosis not present

## 2022-05-22 DIAGNOSIS — C61 Malignant neoplasm of prostate: Secondary | ICD-10-CM

## 2022-05-22 DIAGNOSIS — N3041 Irradiation cystitis with hematuria: Secondary | ICD-10-CM | POA: Diagnosis not present

## 2022-05-22 LAB — GLUCOSE, CAPILLARY
Glucose-Capillary: 223 mg/dL — ABNORMAL HIGH (ref 70–99)
Glucose-Capillary: 131 mg/dL — ABNORMAL HIGH (ref 70–99)

## 2022-05-22 NOTE — Progress Notes (Signed)
KUMAR, FALWELL ([id number]) [id number] Page 1 of 2 Visit Report for 05/21/2022 Problem List Details Patient Name: Date of Service: Dillon Dun MES E. 05/21/2022 10:00 A M Medical Record Number: [id number] Patient Account Number: [id number] Date of Birth/Sex: Treating RN: February 05, 1966 (56 y.o. 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 05/21/2022 Medical Record Number: [id number] Patient Account Number: [id number] Date of Birth/Sex: Treating RN: 1965-07-03 (56 y.o. [name] Primary Care Provider: [name] Other Clinician: [name] Referring Provider: Treating Provider/Extender: [name] in Treatment: 7 Diagnosis Coding ICD-10 Codes Code Description N30.41 Irradiation cystitis with hematuria [name], [name] ([id number]) [id number] Page 2 of 2 R31.9 Hematuria, unspecified C61 Malignant neoplasm of prostate E10.3529 Type 1 diabetes mellitus with proliferative diabetic  retinopathy with traction retinal detachment involving the macula, unspecified eye Facility Procedures : CPT4 Code: 78242353 Description: G0277-(Facility Use Only) HBOT full body chamber, 78mn , ICD-10 Diagnosis Description 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 Hematuria, unspecified C61 Malignant neoplasm of prostate Modifier: Quantity: 4 Physician Procedures : CPT4 Code Description Modifier [id number] 40086- WC PHYS HYPERBARIC OXYGEN THERAPY ICD-10 Diagnosis Description 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 Hematuria, unspecified C61 Malignant neoplasm of prostate Quantity: 1 Electronic Signature(s) Signed: 05/21/2022 12:51:53 PM By: GValeria BatmanEMT Signed: 05/21/2022 2:59:42 PM By: CFredirick MaudlinMD FACS Entered By: GValeria Batmanon 05/21/2022 12:51:53

## 2022-05-22 NOTE — Progress Notes (Signed)
DENY, CHEVEZ ([id number]) [id number] Page 1 of 2 Visit Report for 05/21/2022 HBO Details Patient Name: Date of Service: [name]. 05/21/2022 10:00 A M Medical Record Number: [id number] Patient Account Number: [id number] Date of Birth/Sex: Treating RN: [birth year] (56 y.o. [name] Primary Care Thierno Hun: [name] Other Clinician: [name] Referring Dade Rodin: Treating Susan Arana/Extender: [name] in Treatment: 7 HBO Treatment Course Details Treatment Course Number: 1 Ordering Jarry Manon: [name] T Treatments Ordered: otal 40 HBO Treatment Start Date: 04/09/2022 HBO Indication: Late Effect of Radiation HBO Treatment Details Treatment Number: 27 Patient Type: Outpatient Chamber Type: Monoplace Chamber Serial #: [id number] Treatment Protocol: 2.0 ATA with 90 minutes oxygen, and no air breaks Treatment Details Compression Rate Down: 2.0 psi / minute De-Compression Rate Up: 2.0 psi / minute Air breaks and breathing Decompress Decompress Compress Tx Pressure Begins Reached periods Begins Ends (leave unused spaces blank) Chamber Pressure (ATA 1 2 ------2 1 ) Clock Time (24 hr) 09:57 10:05 - - - - - - 11:35 11:43 Treatment Length: 106 (minutes) Treatment Segments: 4 Vital Signs Capillary Blood Glucose Reference Range: 80 - 120 mg / dl HBO Diabetic Blood Glucose Intervention Range: <131 mg/dl or >249 mg/dl Time Vitals Blood Respiratory Capillary Blood Glucose Pulse Action Type: Pulse: Temperature: Taken: Pressure: Rate: Glucose (mg/dl): Meter #: Oximetry (%) Taken: Pre 09:39 140/57 91 18 97.6 274 Post 11:50 157/80 81 20 97.5 223 Treatment Response Treatment Toleration: Well Treatment Completion Status: Treatment Completed without Adverse Event Treatment Notes The patient was seen in the ED last night for urinary retention. ED notes scanned into [facility]. Dr. Celine Ahr informed of the patient  blood sugar. Physician HBO Attestation: I certify that I supervised this HBO treatment in accordance with Medicare guidelines. A trained emergency response team is readily available per Yes hospital policies and procedures. Continue HBOT as ordered. Yes Electronic Signature(s) Signed: 05/21/2022 3:00:47 PM By: [name] MD FACS Previous Signature: 05/21/2022 12:51:28 PM Version By: [name] EMT Entered By: [name] on 05/21/2022 15:00:47 [name] ([id number]) [id number] Page 2 of 2 -------------------------------------------------------------------------------- HBO Safety Checklist Details Patient Name: Date of Service: [name]. 05/21/2022 10:00 A M Medical Record Number: [id number] Patient Account Number: [id number] Date of Birth/Sex: Treating RN: 23-Aug-1965 (56 y.o. [name] Primary Care Omesha Bowerman: [name] Other Clinician: [name] Referring Sakoya Win: Treating Elita Dame/Extender: [name] in Treatment: 7 [name] Safety Checklist Consent Form Signed Patient voided / foley secured and emptied When did you last eato 0730 Last dose of injectable or oral agent 0700 Ostomy pouch emptied and vented if applicable NA All implantable devices assessed, documented and approved NA Intravenous access site secured and place NA Valuables secured Linens and cotton and cotton/polyester blend (less than 51% polyester) Personal oil-based products / skin lotions / body lotions removed Wigs or hairpieces removed NA Smoking or tobacco materials removed Books / newspapers / magazines / loose paper removed Cologne, aftershave, perfume and deodorant removed Jewelry removed (may wrap wedding band) Make-up removed NA Hair care products removed Battery operated devices (external) removed Heating patches and chemical warmers removed Titanium eyewear removed NA Nail polish  cured greater than 10 hours NA Casting material cured greater than 10 hours NA Hearing aids removed NA Loose dentures or partials removed NA Prosthetics have been removed NA Patient demonstrates correct use of air break device (if applicable) Patient concerns have been addressed Patient grounding bracelet  on and cord attached to chamber Specifics for Inpatients (complete in addition to above) Medication sheet sent with patient NA Intravenous medications needed or due during therapy sent with patient NA Drainage tubes (Mooreg. nasogastric tube or chest tube secured and vented) NA Endotracheal or Tracheotomy tube secured NA Cuff deflated of air and inflated with saline NA Airway suctioned NA Notes The safety check was done before the treatment was started. Electronic Signature(s) Signed: 05/21/2022 12:47:04 PM By: [name] EMT Entered By: [name] on 05/21/2022 12:47:04

## 2022-05-23 NOTE — Progress Notes (Signed)
PENG, THORSTENSON ([id number]) [id number] Page 1 of 2 Visit Report for 05/22/2022 Arrival Information Details Patient Name: Date of Service: Dillon Dun MES E. 05/22/2022 10:00 A M Medical Record Number: [id number] Patient Account Number: [id number] Date of Birth/Sex: Treating RN: September 24, 1965 (56 y.o. [name], Meta.Reding Primary Care Daevion Navarette: [name] Other Clinician: [name] Referring Arlind Klingerman: Treating Ferlin Fairhurst/Extender: [name] in Treatment: 7 Visit Information History Since Last Visit All ordered tests and consults were completed: Yes Patient Arrived: Ambulatory Added or deleted any medications: No Arrival Time: 09:22 Any new allergies or adverse reactions: No Accompanied By: None Had a fall or experienced change in No Transfer Assistance: None activities of daily living that may affect Patient Identification Verified: Yes risk of falls: Secondary Verification Process Completed: Yes Signs or symptoms of abuse/neglect since last visito No Patient Requires Transmission-Based Precautions: No Hospitalized since last visit: No Patient Has Alerts: No Implantable device outside of the clinic excluding No cellular tissue based products placed in the center since last visit: Pain Present Now: No Electronic Signature(s) Signed: 05/22/2022 3:43:53 PM By: [name] EMT Entered By: [name] on 05/22/2022 15:43:52 -------------------------------------------------------------------------------- Encounter Discharge Information Details Patient Name: Date of Service: Dillon Dun MES E. 05/22/2022 10:00 A M Medical Record Number: [id number] Patient Account Number: [id number] Date of Birth/Sex: Treating RN: [birth year] (56 y.o. [name] Primary Care Frankie Scipio: [name] Other Clinician: [name] Referring Santrice Muzio: Treating Rebel Willcutt/Extender: [name] in Treatment:  7 Encounter Discharge Information Items Discharge Condition: Stable Ambulatory Status: Ambulatory Discharge Destination: Home Transportation: Private Auto Accompanied By: None Schedule Follow-up Appointment: Yes Clinical Summary of Care: Electronic Signature(s) Signed: 05/22/2022 3:59:12 PM By: [name] EMT Entered By: [name] on 05/22/2022 [place], Dillon E ([id number]) [id number] Page 2 of 2 -------------------------------------------------------------------------------- Vitals Details Patient Name: Date of Service: Dillon Dun MES E. 05/22/2022 10:00 A M Medical Record Number: [id number] Patient Account Number: [id number] Date of Birth/Sex: Treating RN: Oct 01, 1965 (56 y.o. [name] Primary Care Teng Decou: [name] Other Clinician: [name] Referring Isabellamarie Randa: Treating Margaretmary Prisk/Extender: [name] in Treatment: 7 Vital Signs Time Taken: 10:07 Temperature (F): 97.2 Height (in): 70 Pulse (bpm): 87 Weight (lbs): 210 Respiratory Rate (breaths/min): 16 Body Mass Index (BMI): 30.1 Blood Pressure (mmHg): 133/61 Capillary Blood Glucose (mg/dl): 223 Reference Range: 80 - 120 mg / dl Electronic Signature(s) Signed: 05/22/2022 3:55:21 PM By: [name] EMT Entered By: [name] on 05/22/2022 15:55:21

## 2022-05-23 NOTE — Progress Notes (Signed)
[name] ([id number]) [id number] Page 1 of 2 Visit Report for 05/22/2022 HBO Details Patient Name: Date of Service: [name] MES E. 05/22/2022 10:00 A M Medical Record Number: [id number] Patient Account Number: [id number] Date of Birth/Sex: Treating RN: Oct 26, 1965 (56 y.o. [name] Primary Care [name]: [name] Other Clinician: [name] Referring [name]: Treating [name]/Extender: [name] in Treatment: 7 HBO Treatment Course Details Treatment Course Number: 1 Ordering [name]: [name] T Treatments Ordered: otal 40 HBO Treatment Start Date: 04/09/2022 HBO Indication: Late Effect of Radiation HBO Treatment Details Treatment Number: 28 Patient Type: Outpatient Chamber Type: Monoplace Chamber Serial #: [id number] Treatment Protocol: 2.0 ATA with 90 minutes oxygen, and no air breaks Treatment Details Compression Rate Down: 2.0 psi / minute De-Compression Rate Up: 2.0 psi / minute Air breaks and breathing Decompress Decompress Compress Tx Pressure Begins Reached periods Begins Ends (leave unused spaces blank) Chamber Pressure (ATA 1 2 ------2 1 ) Clock Time (24 hr) 10:28 10:36 - - - - - - 12:06 12:14 Treatment Length: 106 (minutes) Treatment Segments: 4 Vital Signs Capillary Blood Glucose Reference Range: 80 - 120 mg / dl HBO Diabetic Blood Glucose Intervention Range: <131 mg/dl or >249 mg/dl Time Vitals Blood Respiratory Capillary Blood Glucose Pulse Action Type: Pulse: Temperature: Taken: Pressure: Rate: Glucose (mg/dl): Meter #: Oximetry (%) Taken: Pre 10:07 133/61 87 16 97.2 223 Post 12:17 144/90 81 16 97.2 131 Treatment Response Treatment Toleration: Well Treatment Completion Status: Treatment Completed without Adverse Event Physician HBO Attestation: I certify that I supervised this HBO treatment in accordance with Medicare guidelines. A trained emergency response  team is readily available per Yes hospital policies and procedures. Continue HBOT as ordered. Yes Electronic Signature(s) Signed: 05/25/2022 3:53:46 PM By: Dillon Shan DO Previous Signature: 05/22/2022 3:57:52 PM Version By: [name] EMT Entered By: [name] on 05/25/2022 15:51:38 [name] ([id number]) [id number] Page 2 of 2 -------------------------------------------------------------------------------- HBO Safety Checklist Details Patient Name: Date of Service: [name] MES E. 05/22/2022 10:00 A M Medical Record Number: [id number] Patient Account Number: [id number] Date of Birth/Sex: Treating RN: [birth year] (56 y.o. [name], [name] Primary Care [name]: [name] Other Clinician: [name] Referring [name]: Treating [name]/Extender: [name] in Treatment: 7 [name] Safety Checklist Consent Form Signed Patient voided / foley secured and emptied When did you last eato 0730 Last dose of injectable or oral agent 0800 Ostomy pouch emptied and vented if applicable NA All implantable devices assessed, documented and approved NA Intravenous access site secured and place NA Valuables secured Linens and cotton and cotton/polyester blend (less than 51% polyester) Personal oil-based products / skin lotions / body lotions removed Wigs or hairpieces removed NA Smoking or tobacco materials removed Books / newspapers / magazines / loose paper removed Cologne, aftershave, perfume and deodorant removed Jewelry removed (may wrap wedding band) Make-up removed NA Hair care products removed Battery operated devices (external) removed Heating patches and chemical warmers removed Titanium eyewear removed NA Nail polish cured greater than 10 hours NA Casting material cured greater than 10 hours NA Hearing aids removed NA Loose dentures or partials removed NA Prosthetics have  been removed NA Patient demonstrates correct use of air break device (if applicable) Patient concerns have been addressed Patient grounding bracelet on and cord attached to chamber Specifics for Inpatients (complete in addition to above) Medication sheet sent with patient NA Intravenous medications needed or due during therapy sent  with patient NA Drainage tubes (Mooreg. nasogastric tube or chest tube secured and vented) NA Endotracheal or Tracheotomy tube secured NA Cuff deflated of air and inflated with saline NA Airway suctioned NA Notes The safety check was done before the treatment was started. Electronic Signature(s) Signed: 05/22/2022 3:56:24 PM By: [name] EMT Entered By: [name] on 05/22/2022 15:56:24

## 2022-05-25 ENCOUNTER — Encounter (HOSPITAL_BASED_OUTPATIENT_CLINIC_OR_DEPARTMENT_OTHER): Payer: BC Managed Care – PPO | Admitting: Internal Medicine

## 2022-05-25 DIAGNOSIS — N3041 Irradiation cystitis with hematuria: Secondary | ICD-10-CM | POA: Diagnosis not present

## 2022-05-25 LAB — GLUCOSE, CAPILLARY
Glucose-Capillary: 111 mg/dL — ABNORMAL HIGH (ref 70–99)
Glucose-Capillary: 127 mg/dL — ABNORMAL HIGH (ref 70–99)

## 2022-05-25 NOTE — Progress Notes (Signed)
[name], [name] ([id number]) [id number] Page 1 of 2 Visit Report for 05/22/2022 Problem List Details Patient Name: Date of Service: Dillon Dun MES E. 05/22/2022 10:00 A M Medical Record Number: [id number] Patient Account Number: [id number] Date of Birth/Sex: Treating RN: [birth year] (56 y.o. 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 05/22/2022 Medical Record Number: [id number] Patient Account Number: [id number] Date of Birth/Sex: Treating RN: January 20, 1966 (56 y.o. [name] Primary Care Provider: [name] Other Clinician: [name] Referring Provider: Treating Provider/Extender: [name] in Treatment: 7 Diagnosis Coding ICD-10 Codes Code Description N30.41 Irradiation cystitis with hematuria CAGE, [name] ([id number]) [id number] Page 2 of 2 R31.9 Hematuria, unspecified C61 Malignant neoplasm of prostate E10.3529 Type 1 diabetes mellitus with proliferative diabetic retinopathy with  traction retinal detachment involving the macula, unspecified eye Facility Procedures : CPT4 Code: 92119417 Description: G0277-(Facility Use Only) HBOT full body chamber, 65mn , ICD-10 Diagnosis Description 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 Hematuria, unspecified C61 Malignant neoplasm of prostate Modifier: Quantity: 4 Physician Procedures : CPT4 Code Description Modifier [id number]- WC PHYS HYPERBARIC OXYGEN THERAPY ICD-10 Diagnosis Description 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 Hematuria, unspecified C61 Malignant neoplasm of prostate Quantity: 1 Electronic Signature(s) Signed: 05/22/2022 3:58:26 PM By: GValeria BatmanEMT Signed: 05/25/2022 3:53:46 PM By: HKalman ShanDO Entered By: GValeria Batmanon 05/22/2022 15:58:25

## 2022-05-25 NOTE — Progress Notes (Signed)
[name], [name] ([id number]) [id number] Page 1 of 2 Visit Report for 05/25/2022 Arrival Information Details Patient Name: Date of Service: [name] MES E. 05/25/2022 10:00 A M Medical Record Number: [id number] Patient Account Number: [id number] Date of Birth/Sex: Treating RN: May 19, 1966 (56 y.o. [name], Meta.Reding Primary Care Sara Keys: [name] Other Clinician: [name] Referring Sharese Manrique: Treating Mychelle Kendra/Extender: Georgette Shell in Treatment: 7 Visit Information History Since Last Visit All ordered tests and consults were completed: Yes Patient Arrived: Ambulatory Added or deleted any medications: No Arrival Time: 09:34 Any new allergies or adverse reactions: No Accompanied By: None Had a fall or experienced change in No Transfer Assistance: None activities of daily living that may affect Patient Identification Verified: Yes risk of falls: Secondary Verification Process Completed: Yes Signs or symptoms of abuse/neglect since last visito No Patient Requires Transmission-Based Precautions: No Hospitalized since last visit: No Patient Has Alerts: No Implantable device outside of the clinic excluding No cellular tissue based products placed in the center since last visit: Pain Present Now: No Electronic Signature(s) Signed: 05/25/2022 1:25:45 PM By: [name] EMT Entered By: [name] on 05/25/2022 13:25:45 -------------------------------------------------------------------------------- Encounter Discharge Information Details Patient Name: Date of Service: [name] MES E. 05/25/2022 10:00 A M Medical Record Number: [id number] Patient Account Number: [id number] Date of Birth/Sex: Treating RN: 05-18-1966 (56 y.o. [name] Primary Care Ilyas Lipsitz: [name] Other Clinician: [name] Referring Christin Moline: Treating Mega Kinkade/Extender: Georgette Shell in Treatment:  7 Encounter Discharge Information Items Discharge Condition: Stable Ambulatory Status: Ambulatory Discharge Destination: Home Transportation: Private Auto Accompanied By: None Schedule Follow-up Appointment: Yes Clinical Summary of Care: Electronic Signature(s) Signed: 05/25/2022 1:42:32 PM By: [name] EMT Entered By: [name] on 05/25/2022 13:42:32 [name] ([id number]) [id number] Page 2 of 2 -------------------------------------------------------------------------------- Vitals Details Patient Name: Date of Service: [name] MES E. 05/25/2022 10:00 A M Medical Record Number: [id number] Patient Account Number: [id number] Date of Birth/Sex: Treating RN: 03-10-1966 (56 y.o. [name] Primary Care Marielle Mantione: [name] Other Clinician: [name] Referring Kiel Cockerell: Treating Biannca Scantlin/Extender: Georgette Shell in Treatment: 7 Vital Signs Time Taken: 09:40 Capillary Blood Glucose (mg/dl): 111 Height (in): 70 Reference Range: 80 - 120 mg / dl Weight (lbs): 210 Body Mass Index (BMI): 30.1 Electronic Signature(s) Signed: 05/25/2022 1:26:27 PM By: [name] EMT Entered By: [name] on 05/25/2022 13:26:27

## 2022-05-25 NOTE — Progress Notes (Signed)
KYNDAL, GLOSTER ([id number]) [id number] Page 1 of 2 Visit Report for 05/25/2022 HBO Details Patient Name: Date of Service: [name] MES E. 05/25/2022 10:00 A M Medical Record Number: [id number] Patient Account Number: [id number] Date of Birth/Sex: Treating RN: 02/08/1966 (56 y.o. [name] Primary Care Larisa Lanius: [name] Other Clinician: [name] Referring Rhiley Tarver: Treating Clorissa Gruenberg/Extender: Georgette Shell in Treatment: 7 HBO Treatment Course Details Treatment Course Number: 1 Ordering Antoinne Spadaccini: [name] T Treatments Ordered: otal 40 HBO Treatment Start Date: 04/09/2022 HBO Indication: Late Effect of Radiation HBO Treatment Details Treatment Number: 29 Patient Type: Outpatient Chamber Type: Monoplace Chamber Serial #: [id number] Treatment Protocol: 2.0 ATA with 90 minutes oxygen, and no air breaks Treatment Details Compression Rate Down: 2.0 psi / minute De-Compression Rate Up: 2.0 psi / minute Air breaks and breathing Decompress Decompress Compress Tx Pressure Begins Reached periods Begins Ends (leave unused spaces blank) Chamber Pressure (ATA 1 2 ------2 1 ) Clock Time (24 hr) 10:07 10:15 - - - - - - 11:45 11:53 Treatment Length: 106 (minutes) Treatment Segments: 4 Vital Signs Capillary Blood Glucose Reference Range: 80 - 120 mg / dl HBO Diabetic Blood Glucose Intervention Range: <131 mg/dl or >249 mg/dl Type: Time Vitals Blood Pulse: Respiratory Capillary Blood Glucose Pulse Action Temperature: Taken: Pressure: Rate: Glucose (mg/dl): Meter #: Oximetry (%) Taken: Pre 09:40 111 Patient given 8 oz glucerna Post 11:58 162/64 77 18 97.5 213 Pre 09:58 153/82 94 18 97.3 127 Treatment Response Treatment Toleration: Well Treatment Completion Status: Treatment Completed without Adverse Event Treatment Notes The patient blood sugar was 111. He was given 8 oz Glucerna. Blood sugar recheck at 0958  was 127. Dr. Dellia Nims informed. Louis Gaw Notes No concerns with treatment given Physician HBO Attestation: I certify that I supervised this HBO treatment in accordance with Medicare guidelines. A trained emergency response team is readily available per Yes hospital policies and procedures. Continue HBOT as ordered. Yes Electronic Signature(s) Signed: 05/25/2022 5:01:06 PM By: Linton Ham MD Previous Signature: 05/25/2022 1:41:33 PM Version By: [name] EMT [name] ([id number]) [id number] Page 2 of 2 Previous Signature: 05/25/2022 1:41:33 PM Version By: [name] EMT Entered By: Linton Ham on 05/25/2022 16:58:29 -------------------------------------------------------------------------------- HBO Safety Checklist Details Patient Name: Date of Service: [name] MES E. 05/25/2022 10:00 A M Medical Record Number: [id number] Patient Account Number: [id number] Date of Birth/Sex: Treating RN: Nov 30, 1965 (56 y.o. [name], Tammi Klippel Primary Care Kaliyan Osbourn: [name] Other Clinician: [name] Referring Shyana Kulakowski: Treating Ranyia Witting/Extender: Georgette Shell in Treatment: 7 [name] Safety Checklist Consent Form Signed Patient voided / foley secured and emptied When did you last eato 0900 Last dose of injectable or oral agent 0915 Ostomy pouch emptied and vented if applicable NA All implantable devices assessed, documented and approved NA Intravenous access site secured and place NA Valuables secured Linens and cotton and cotton/polyester blend (less than 51% polyester) Personal oil-based products / skin lotions / body lotions removed Wigs or hairpieces removed NA Smoking or tobacco materials removed Books / newspapers / magazines / loose paper removed Cologne, aftershave, perfume and deodorant removed Jewelry removed (may wrap wedding band) Make-up removed NA Hair care products  removed Battery operated devices (external) removed Heating patches and chemical warmers removed Titanium eyewear removed NA Plastic frames Nail polish cured greater than 10 hours NA Casting material cured greater than 10 hours NA Hearing aids removed NA Loose dentures or partials removed NA  Prosthetics have been removed NA Patient demonstrates correct use of air break device (if applicable) Patient concerns have been addressed Patient grounding bracelet on and cord attached to chamber Specifics for Inpatients (complete in addition to above) Medication sheet sent with patient NA Intravenous medications needed or due during therapy sent with patient NA Drainage tubes (e.g. nasogastric tube or chest tube secured and vented) NA Endotracheal or Tracheotomy tube secured NA Cuff deflated of air and inflated with saline NA Airway suctioned NA Notes The safety check was done before the treatment was started. The patient stated that he had an orange, breakfast burrito to eat before coming in for treatment. Electronic Signature(s) Signed: 05/25/2022 1:30:38 PM By: [name] EMT Entered By: [name] on 05/25/2022 13:30:37

## 2022-05-26 ENCOUNTER — Encounter (HOSPITAL_BASED_OUTPATIENT_CLINIC_OR_DEPARTMENT_OTHER): Payer: BC Managed Care – PPO | Admitting: Internal Medicine

## 2022-05-26 DIAGNOSIS — C61 Malignant neoplasm of prostate: Secondary | ICD-10-CM | POA: Diagnosis not present

## 2022-05-26 DIAGNOSIS — R319 Hematuria, unspecified: Secondary | ICD-10-CM

## 2022-05-26 DIAGNOSIS — N3041 Irradiation cystitis with hematuria: Secondary | ICD-10-CM

## 2022-05-26 LAB — GLUCOSE, CAPILLARY
Glucose-Capillary: 213 mg/dL — ABNORMAL HIGH (ref 70–99)
Glucose-Capillary: 269 mg/dL — ABNORMAL HIGH (ref 70–99)
Glucose-Capillary: 282 mg/dL — ABNORMAL HIGH (ref 70–99)
Glucose-Capillary: 185 mg/dL — ABNORMAL HIGH (ref 70–99)

## 2022-05-26 NOTE — Progress Notes (Signed)
LASEAN, GORNIAK ([id number]) [id number] Page 1 of 2 Visit Report for 05/25/2022 Problem List Details Patient Name: Date of Service: Dillon Dun MES E. 05/25/2022 10:00 A M Medical Record Number: [id number] Patient Account Number: [id number] Date of Birth/Sex: Treating RN: 1965-11-24 (56 y.o. 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 05/25/2022 Medical Record Number: [id number] Patient Account Number: [id number] Date of Birth/Sex: Treating RN: September 23, 1965 (56 y.o. [name] Primary Care Provider: [name] Other Clinician: [name] Referring Provider: Treating Provider/Extender: Georgette Shell in Treatment: 7 Diagnosis Coding ICD-10 Codes Code Description N30.41 Irradiation cystitis with hematuria [name], [name] ([id number]) [id number] Page 2 of 2 R31.9 Hematuria, unspecified C61 Malignant neoplasm of prostate E10.3529 Type 1 diabetes mellitus with proliferative diabetic retinopathy  with traction retinal detachment involving the macula, unspecified eye Facility Procedures : CPT4 Code: [id number] Description: G0277-(Facility Use Only) HBOT full body chamber, 30mn , ICD-10 Diagnosis Description 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 Hematuria, unspecified C61 Malignant neoplasm of prostate Modifier: Quantity: 4 Physician Procedures : CPT4 Code Description Modifier [id number]- WC PHYS HYPERBARIC OXYGEN THERAPY ICD-10 Diagnosis Description 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 Hematuria, unspecified C61 Malignant neoplasm of prostate Quantity: 1 Electronic Signature(s) Signed: 05/25/2022 1:41:55 PM By: GValeria BatmanEMT Signed: 05/25/2022 5:01:06 PM By: RLinton HamMD Entered By: GValeria Batmanon 05/25/2022 13:41:55

## 2022-05-26 NOTE — Progress Notes (Signed)
[name] ([id number]) [id number] Page 1 of 2 Visit Report for 05/26/2022 Arrival Information Details Patient Name: Date of Service: [name] MES E. 05/26/2022 8:00 A M Medical Record Number: [id number] Patient Account Number: [id number] Date of Birth/Sex: Treating RN: 1965/10/07 (56 y.o. [name] Primary Care Pricsilla Lindvall: [name] Other Clinician: [name] Referring Husain Costabile: Treating Aubriegh Minch/Extender: Georgette Shell in Treatment: 8 Visit Information History Since Last Visit All ordered tests and consults were completed: Yes Patient Arrived: Ambulatory Added or deleted any medications: No Arrival Time: 07:51 Any new allergies or adverse reactions: No Accompanied By: None Had a fall or experienced change in No Transfer Assistance: None activities of daily living that may affect Patient Identification Verified: Yes risk of falls: Secondary Verification Process Completed: Yes Signs or symptoms of abuse/neglect since last visito No Patient Requires Transmission-Based Precautions: No Hospitalized since last visit: No Patient Has Alerts: No Implantable device outside of the clinic excluding No cellular tissue based products placed in the center since last visit: Pain Present Now: No Electronic Signature(s) Signed: 05/27/2022 2:14:49 PM By: [name] EMT Previous Signature: 05/26/2022 2:19:55 PM Version By: [name] EMT Entered By: [name] on 05/27/2022 14:14:49 -------------------------------------------------------------------------------- Encounter Discharge Information Details Patient Name: Date of Service: [name] MES E. 05/26/2022 8:00 A M Medical Record Number: [id number] Patient Account Number: [id number] Date of Birth/Sex: Treating RN: [birth year] (56 y.o. [name] Primary Care Lakelynn Severtson: [name] Other Clinician: [name] Referring Hutch Rhett: Treating  Satara Virella/Extender: Georgette Shell in Treatment: 8 Encounter Discharge Information Items Discharge Condition: Stable Ambulatory Status: Ambulatory Discharge Destination: Home Transportation: Private Auto Accompanied By: None Schedule Follow-up Appointment: Yes Clinical Summary of Care: Electronic Signature(s) Signed: 05/27/2022 2:19:15 PM By: [name] EMT Previous Signature: 05/26/2022 2:27:47 PM Version By: [name] EMT Entered By: [name] on 05/27/2022 14:19:15 [name] ([id number]) [id number] Page 2 of 2 -------------------------------------------------------------------------------- Vitals Details Patient Name: Date of Service: [name] MES E. 05/26/2022 8:00 A M Medical Record Number: [id number] Patient Account Number: [id number] Date of Birth/Sex: Treating RN: [birth year] (56 y.o. [name] Primary Care Byan Poplaski: [name] Other Clinician: [name] Referring [name]: Treating Maeve Debord/Extender: Georgette Shell in Treatment: 8 Vital Signs Time Taken: 08:00 Temperature (F): 97.2 Height (in): 70 Pulse (bpm): 102 Weight (lbs): 210 Respiratory Rate (breaths/min): 16 Body Mass Index (BMI): 30.1 Blood Pressure (mmHg): 143/68 Capillary Blood Glucose (mg/dl): 282 Reference Range: 80 - 120 mg / dl Notes Dr. Dellia Nims informed of the patient blood sugar and heart rate. Electronic Signature(s) Signed: 05/27/2022 2:15:19 PM By: [name] EMT Previous Signature: 05/26/2022 2:22:14 PM Version By: [name] EMT Entered By: [name] on 05/27/2022 14:15:19

## 2022-05-26 NOTE — Progress Notes (Addendum)
VITALI, SEIBERT ([id number]) [id number] Page 1 of 2 Visit Report for 05/26/2022 HBO Details Patient Name: Date of Service: [name]. 05/26/2022 8:00 A M Medical Record Number: [id number] Patient Account Number: [id number] Date of Birth/Sex: Treating RN: 04/13/1966 (56 y.o. [name] Primary Care Emberley Kral: [name] Other Clinician: [name] Referring Shantay Sonn: Treating Mintie Witherington/Extender: Georgette Shell in Treatment: 8 HBO Treatment Course Details Treatment Course Number: 1 Ordering Rolen Conger: [name] T Treatments Ordered: otal 40 HBO Treatment Start Date: 04/09/2022 HBO Indication: Late Effect of Radiation HBO Treatment Details Treatment Number: 30 Patient Type: Outpatient Chamber Type: Monoplace Chamber Serial #: [id number] Treatment Protocol: 2.0 ATA with 90 minutes oxygen, and no air breaks Treatment Details Compression Rate Down: 2.0 psi / minute De-Compression Rate Up: 2.0 psi / minute Air breaks and breathing Decompress Decompress Compress Tx Pressure Begins Reached periods Begins Ends (leave unused spaces blank) Chamber Pressure (ATA 1 2 ------2 1 ) Clock Time (24 hr) 08:11 08:19 - - - - - - 09:49 09:57 Treatment Length: 106 (minutes) Treatment Segments: 4 Vital Signs Capillary Blood Glucose Reference Range: 80 - 120 mg / dl HBO Diabetic Blood Glucose Intervention Range: <131 mg/dl or >249 mg/dl Time Vitals Blood Respiratory Capillary Blood Glucose Pulse Action Type: Pulse: Temperature: Taken: Pressure: Rate: Glucose (mg/dl): Meter #: Oximetry (%) Taken: Pre 08:00 143/68 102 16 97.2 282 Post 10:00 175/87 80 18 97.2 185 Treatment Response Treatment Toleration: Well Treatment Completion Status: Treatment Completed without Adverse Event Alizaya Oshea Notes No concerns with treatment given Physician HBO Attestation: I certify that I supervised this HBO treatment in accordance with  Medicare guidelines. A trained emergency response team is readily available per Yes hospital policies and procedures. Continue HBOT as ordered. Yes Electronic Signature(s) Signed: 06/01/2022 7:48:19 AM By: Linton Ham MD Previous Signature: 05/27/2022 2:18:47 PM Version By: [name] EMT Previous Signature: 05/27/2022 2:15:47 PM Version By: [name] EMT Previous Signature: 05/26/2022 4:34:47 PM Version By: [name] DO Previous Signature: 05/26/2022 2:26:52 PM Version By: [name] EMT Entered By: Linton Ham on 06/01/2022 07:41:42 [name] ([id number]) [id number] Page 2 of 2 -------------------------------------------------------------------------------- HBO Safety Checklist Details Patient Name: Date of Service: [name]. 05/26/2022 8:00 A M Medical Record Number: [id number] Patient Account Number: [id number] Date of Birth/Sex: Treating RN: 1965-09-27 (56 y.o. [name] Primary Care Jamil Armwood: [name] Other Clinician: [name] Referring Zayna Toste: Treating Temesgen Weightman/Extender: Georgette Shell in Treatment: 8 [name] Safety Checklist Consent Form Signed Patient voided / foley secured and emptied When did you last eato 0715 Last dose of injectable or oral agent 0730 Ostomy pouch emptied and vented if applicable NA All implantable devices assessed, documented and approved NA Intravenous access site secured and place NA Valuables secured Linens and cotton and cotton/polyester blend (less than 51% polyester) Personal oil-based products / skin lotions / body lotions removed Wigs or hairpieces removed NA Smoking or tobacco materials removed Books / newspapers / magazines / loose paper removed Cologne, aftershave, perfume and deodorant removed Jewelry removed (may wrap wedding band) Make-up removed NA Hair care products removed Battery operated devices  (external) removed Heating patches and chemical warmers removed Titanium eyewear removed NA Nail polish cured greater than 10 hours NA Casting material cured greater than 10 hours NA Hearing aids removed NA Loose dentures or partials removed NA Prosthetics have been removed NA Patient demonstrates correct use of air break device (if  applicable) Patient concerns have been addressed Patient grounding bracelet on and cord attached to chamber Specifics for Inpatients (complete in addition to above) Medication sheet sent with patient NA Intravenous medications needed or due during therapy sent with patient NA Drainage tubes ([name].g. nasogastric tube or chest tube secured and vented) NA Endotracheal or Tracheotomy tube secured NA Cuff deflated of air and inflated with saline NA Airway suctioned NA Notes The safety check was done before the treatment was started. The patient stated that he had homemade muffin before coming in today. Electronic Signature(s) Signed: 05/27/2022 2:15:34 PM By: [name] EMT Previous Signature: 05/26/2022 2:23:54 PM Version By: [name] EMT Entered By: [name] on 05/27/2022 14:15:34

## 2022-05-27 ENCOUNTER — Encounter (HOSPITAL_BASED_OUTPATIENT_CLINIC_OR_DEPARTMENT_OTHER): Payer: BC Managed Care – PPO | Admitting: Internal Medicine

## 2022-05-27 DIAGNOSIS — N3041 Irradiation cystitis with hematuria: Secondary | ICD-10-CM | POA: Diagnosis not present

## 2022-05-27 LAB — GLUCOSE, CAPILLARY
Glucose-Capillary: 102 mg/dL — ABNORMAL HIGH (ref 70–99)
Glucose-Capillary: 141 mg/dL — ABNORMAL HIGH (ref 70–99)
Glucose-Capillary: 177 mg/dL — ABNORMAL HIGH (ref 70–99)

## 2022-05-27 NOTE — Progress Notes (Signed)
[name], [name] ([id number]) [id number] Page 1 of 2 Visit Report for 05/27/2022 HBO Details Patient Name: Date of Service: [name] MES E. 05/27/2022 8:00 A M Medical Record Number: [id number] Patient Account Number: [id number] Date of Birth/Sex: Treating RN: [birth year] (56 y.o. [name] Primary Care [name]: [name] Other Clinician: [name] Referring [name]: Treating [name]/Extender: [name] in Treatment: 8 HBO Treatment Course Details Treatment Course Number: 1 Ordering [name]: [name] T Treatments Ordered: otal 40 HBO Treatment Start Date: 04/09/2022 HBO Indication: Late Effect of Radiation HBO Treatment Details Treatment Number: 31 Patient Type: Outpatient Chamber Type: Monoplace Chamber Serial #: [id number] Treatment Protocol: 2.0 ATA with 90 minutes oxygen, and no air breaks Treatment Details Compression Rate Down: 2.0 psi / minute De-Compression Rate Up: 2.0 psi / minute Air breaks and breathing Decompress Decompress Compress Tx Pressure Begins Reached periods Begins Ends (leave unused spaces blank) Chamber Pressure (ATA 1 2 ------2 1 ) Clock Time (24 hr) 08:47 08:57 - - - - - - 10:27 10:37 Treatment Length: 110 (minutes) Treatment Segments: 4 Vital Signs Capillary Blood Glucose Reference Range: 80 - 120 mg / dl HBO Diabetic Blood Glucose Intervention Range: <131 mg/dl or >249 mg/dl Type: Time Vitals Blood Pulse: Respiratory Capillary Blood Glucose Pulse Action Temperature: Taken: Pressure: Rate: Glucose (mg/dl): Meter #: Oximetry (%) Taken: Pre 08:02 146/58 89 18 97.3 102 Patient given 8 oz glucerna Post 10:43 156/89 79 20 97.3 177 Pre 08:41 141 Treatment Response Treatment Toleration: Well Treatment Completion Status: Treatment Completed without Adverse Event [name] Notes No concerns with treatment given Physician HBO Attestation: I certify that I supervised  this HBO treatment in accordance with Medicare guidelines. A trained emergency response team is readily available per Yes hospital policies and procedures. Continue HBOT as ordered. Yes Electronic Signature(s) Signed: 05/27/2022 5:47:10 PM By: Dillon Ham MD Previous Signature: 05/27/2022 1:34:01 PM Version By: [name] EMT Entered By: [name] on 05/27/2022 17:32:24 [name] ([id number]) [id number] Page 2 of 2 -------------------------------------------------------------------------------- HBO Safety Checklist Details Patient Name: Date of Service: [name] MES E. 05/27/2022 8:00 A M Medical Record Number: [id number] Patient Account Number: [id number] Date of Birth/Sex: Treating RN: July 05, 1965 (56 y.o. [name], [name] Primary Care Tyerra Loretto: [name] Other Clinician: [name] Referring [name]: Treating [name]/Extender: [name] in Treatment: 8 [name] Safety Checklist Consent Form Signed Patient voided / foley secured and emptied When did you last eato 0700 Last dose of injectable or oral agent 0715 Ostomy pouch emptied and vented if applicable NA All implantable devices assessed, documented and approved NA Intravenous access site secured and place NA Valuables secured Linens and cotton and cotton/polyester blend (less than 51% polyester) Personal oil-based products / skin lotions / body lotions removed Wigs or hairpieces removed NA Smoking or tobacco materials removed Books / newspapers / magazines / loose paper removed Cologne, aftershave, perfume and deodorant removed Jewelry removed (may wrap wedding band) Make-up removed NA Hair care products removed Battery operated devices (external) removed Heating patches and chemical warmers removed Titanium eyewear removed NA Nail polish cured greater than 10 hours NA Casting material cured greater than 10  hours NA Hearing aids removed NA Loose dentures or partials removed NA Prosthetics have been removed NA Patient demonstrates correct use of air break device (if applicable) Patient concerns have been addressed Patient grounding bracelet on and cord attached to chamber Specifics for Inpatients (complete in addition to  above) Medication sheet sent with patient NA Intravenous medications needed or due during therapy sent with patient NA Drainage tubes (e.g. nasogastric tube or chest tube secured and vented) NA Endotracheal or Tracheotomy tube secured NA Cuff deflated of air and inflated with saline NA Airway suctioned NA Notes The safety check was done before the treatment was started. The patient stated that he had 1/2 Sausage, egg, cheese croissants, and cutie orange. Electronic Signature(s) Signed: 05/27/2022 1:32:47 PM By: [name] EMT Entered By: [name] on 05/27/2022 13:32:47

## 2022-05-27 NOTE — Progress Notes (Signed)
CARLIE, CORPUS ([id number]) [id number] Page 1 of 2 Visit Report for 05/27/2022 Arrival Information Details Patient Name: Date of Service: [name] MES E. 05/27/2022 8:00 A M Medical Record Number: [id number] Patient Account Number: [id number] Date of Birth/Sex: Treating RN: [birth year] (56 y.o. [name], Meta.Reding Primary Care Jadrian Bulman: [name] Other Clinician: [name] Referring Vaeda Westall: Treating Jayce Boyko/Extender: Georgette Shell in Treatment: 8 Visit Information History Since Last Visit All ordered tests and consults were completed: Yes Patient Arrived: Ambulatory Added or deleted any medications: No Arrival Time: 07:45 Any new allergies or adverse reactions: No Accompanied By: None Had a fall or experienced change in No Transfer Assistance: None activities of daily living that may affect Patient Identification Verified: Yes risk of falls: Secondary Verification Process Completed: Yes Signs or symptoms of abuse/neglect since last visito No Patient Requires Transmission-Based Precautions: No Hospitalized since last visit: No Patient Has Alerts: No Implantable device outside of the clinic excluding No cellular tissue based products placed in the center since last visit: Pain Present Now: No Electronic Signature(s) Signed: 05/27/2022 1:27:12 PM By: [name] EMT Entered By: [name] on 05/27/2022 13:27:12 -------------------------------------------------------------------------------- Encounter Discharge Information Details Patient Name: Date of Service: [name] MES E. 05/27/2022 8:00 A M Medical Record Number: [id number] Patient Account Number: [id number] Date of Birth/Sex: Treating RN: September 07, 1965 (56 y.o. [name] Primary Care Tashanna Dolin: [name] Other Clinician: [name] Referring Indie Nickerson: Treating Cielle Aguila/Extender: Georgette Shell in Treatment:  8 Encounter Discharge Information Items Discharge Condition: Stable Ambulatory Status: Ambulatory Discharge Destination: Home Transportation: Private Auto Accompanied By: None Schedule Follow-up Appointment: Yes Clinical Summary of Care: Electronic Signature(s) Signed: 05/27/2022 1:35:04 PM By: [name] EMT Entered By: [name] on 05/27/2022 13:35:03 [name] ([id number]) [id number] Page 2 of 2 -------------------------------------------------------------------------------- Vitals Details Patient Name: Date of Service: [name] MES E. 05/27/2022 8:00 A M Medical Record Number: [id number] Patient Account Number: [id number] Date of Birth/Sex: Treating RN: [birth year] (56 y.o. [name] Primary Care Oluwanifemi Susman: [name] Other Clinician: [name] Referring Gabrial Domine: Treating Dulse Rutan/Extender: Georgette Shell in Treatment: 8 Vital Signs Time Taken: 08:02 Temperature (F): 97.3 Height (in): 70 Pulse (bpm): 89 Weight (lbs): 210 Respiratory Rate (breaths/min): 18 Body Mass Index (BMI): 30.1 Blood Pressure (mmHg): 146/58 Capillary Blood Glucose (mg/dl): 102 Reference Range: 80 - 120 mg / dl Electronic Signature(s) Signed: 05/27/2022 1:27:38 PM By: [name] EMT Entered By: [name] on 05/27/2022 13:27:38

## 2022-05-27 NOTE — Progress Notes (Addendum)
YOUSUF, AGER ([id number]) [id number] Page 1 of 2 Visit Report for 05/26/2022 Physician Orders Details Patient Name: Date of Service: Dillon Dun MES E. 05/26/2022 8:00 A M Medical Record Number: [id number] Patient Account Number: [id number] Date of Birth/Sex: Treating RN: Sep 05, 1965 (56 y.o. 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 05/26/2022 8:00 A M Medical Record Number: [id number] Patient Account Number: [id number] Date of Birth/Sex: Treating RN: [birth year] (56 y.o. 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 03/31/2022 No Yes retinal detachment involving the macula, unspecified eye [name], [name] ([id number]) [id number] Page 2 of 2 Inactive Problems Resolved Problems Electronic Signature(s) Signed: 05/27/2022 2:18:25 PM By: [name] EMT Signed: 06/01/2022 7:48:19 AM By: Linton Ham MD Previous Signature: 05/26/2022 2:27:24 PM Version By: [name] EMT Previous Signature: 05/26/2022 4:34:47 PM Version By: [name] DO Entered By: [name] on 05/27/2022 14:18:24 -------------------------------------------------------------------------------- SuperBill Details Patient Name: Date of Service: Dillon Dun MES E. 05/26/2022 Medical Record Number: [id number] Patient Account Number: [id number] Date of Birth/Sex: Treating RN: 08-13-1965 (56 y.o. 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, unspecified eye Facility Procedures : CPT4 Code: 48185631 Description: G0277-(Facility Use Only) HBOT full body chamber, 77mn , ICD-10 Diagnosis Description 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 Hematuria, unspecified C61 Malignant neoplasm of prostate Modifier: Quantity: 4 Physician Procedures : CPT4 Code Description Modifier [id number]- WC PHYS HYPERBARIC OXYGEN THERAPY ICD-10 Diagnosis Description 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 Hematuria, unspecified C61 Malignant neoplasm of prostate Quantity: 1 Electronic Signature(s) Signed: 05/27/2022 2:18:07 PM By: GValeria BatmanEMT Signed: 06/01/2022 7:48:19 AM By: RLinton HamMD Previous Signature: 05/26/2022 2:27:18 PM Version By:  GValeria BatmanEMT Previous Signature: 05/26/2022 4:34:47 PM Version By: HKalman ShanDO Entered By: GValeria Batmanon 05/27/2022 14:18:07

## 2022-05-28 ENCOUNTER — Encounter (HOSPITAL_BASED_OUTPATIENT_CLINIC_OR_DEPARTMENT_OTHER): Payer: BC Managed Care – PPO | Admitting: Internal Medicine

## 2022-05-28 DIAGNOSIS — N3041 Irradiation cystitis with hematuria: Secondary | ICD-10-CM | POA: Diagnosis not present

## 2022-05-28 LAB — GLUCOSE, CAPILLARY
Glucose-Capillary: 160 mg/dL — ABNORMAL HIGH (ref 70–99)
Glucose-Capillary: 138 mg/dL — ABNORMAL HIGH (ref 70–99)

## 2022-05-28 NOTE — Progress Notes (Addendum)
MATTOX, SCHORR ([id number]) [id number] Page 1 of 2 Visit Report for 05/28/2022 Arrival Information Details Patient Name: Date of Service: Dillon Dun MES E. 05/28/2022 8:00 A M Medical Record Number: [id number] Patient Account Number: [id number] Date of Birth/Sex: Treating RN: 1966/03/28 (56 y.o. [name] Primary Care Ima Hafner: [name] Other Clinician: Donavan Burnet Referring Mady Oubre: Treating Nivin Braniff/Extender: Georgette Shell in Treatment: 8 Visit Information History Since Last Visit All ordered tests and consults were completed: Yes Patient Arrived: Ambulatory Added or deleted any medications: No Arrival Time: 07:46 Any new allergies or adverse reactions: No Accompanied By: self Had a fall or experienced change in No Transfer Assistance: None activities of daily living that may affect Patient Identification Verified: Yes risk of falls: Secondary Verification Process Completed: Yes Signs or symptoms of abuse/neglect since last visito No Patient Requires Transmission-Based Precautions: No Hospitalized since last visit: No Patient Has Alerts: No Implantable device outside of the clinic excluding No cellular tissue based products placed in the center since last visit: Pain Present Now: No Electronic Signature(s) Signed: 05/28/2022 9:22:49 AM By: Donavan Burnet CHT EMT BS , , Entered By: Donavan Burnet on 05/28/2022 09:22:49 -------------------------------------------------------------------------------- Encounter Discharge Information Details Patient Name: Date of Service: Dillon Dun MES E. 05/28/2022 8:00 A M Medical Record Number: [id number] Patient Account Number: [id number] Date of Birth/Sex: Treating RN: 1966/02/06 (56 y.o. [name] Primary Care Zamani Crocker: [name] Other Clinician: Donavan Burnet Referring Deiontae Rabel: Treating Richele Strand/Extender: Georgette Shell in Treatment: 8 Encounter Discharge Information Items Discharge Condition: Stable Ambulatory Status: Ambulatory Discharge Destination: Home Transportation: Private Auto Accompanied By: self Schedule Follow-up Appointment: No Clinical Summary of Care: Electronic Signature(s) Signed: 05/28/2022 2:12:24 PM By: Donavan Burnet CHT EMT BS , , Entered By: Donavan Burnet on 05/28/2022 14:12:24 [name] ([id number]) [id number] Page 2 of 2 -------------------------------------------------------------------------------- Vitals Details Patient Name: Date of Service: Dillon Dun MES E. 05/28/2022 8:00 A M Medical Record Number: [id number] Patient Account Number: [id number] Date of Birth/Sex: Treating RN: 1965-09-19 (56 y.o. [name] Primary Care Intisar Claudio: [name] Other Clinician: Donavan Burnet Referring Deztinee Lohmeyer: Treating Jaxn Chiquito/Extender: Georgette Shell in Treatment: 8 Vital Signs Time Taken: 08:06 Temperature (F): 97.9 Height (in): 70 Pulse (bpm): 88 Weight (lbs): 210 Respiratory Rate (breaths/min): 18 Body Mass Index (BMI): 30.1 Blood Pressure (mmHg): 157/70 Capillary Blood Glucose (mg/dl): 160 Reference Range: 80 - 120 mg / dl Electronic Signature(s) Signed: 05/28/2022 9:23:32 AM By: Donavan Burnet CHT EMT BS , , Entered By: Donavan Burnet on 05/28/2022 09:23:32

## 2022-05-28 NOTE — Progress Notes (Signed)
JARMAL, LEWELLING ([id number]) [id number] Page 1 of 2 Visit Report for 05/27/2022 Problem List Details Patient Name: Date of Service: Dillon Dun MES E. 05/27/2022 8:00 A M Medical Record Number: [id number] Patient Account Number: [id number] Date of Birth/Sex: Treating RN: 11/29/1965 (56 y.o. 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 05/27/2022 Medical Record Number: [id number] Patient Account Number: [id number] Date of Birth/Sex: Treating RN: Jan 27, 1966 (56 y.o. 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 with  traction retinal detachment involving the macula, unspecified eye Facility Procedures : CPT4 Code: [id number] Description: G0277-(Facility Use Only) HBOT full body chamber, 2mn , ICD-10 Diagnosis Description 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 Hematuria, unspecified C61 Malignant neoplasm of prostate Modifier: Quantity: 4 Physician Procedures : CPT4 Code Description Modifier [id number]- WC PHYS HYPERBARIC OXYGEN THERAPY ICD-10 Diagnosis Description 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 Hematuria, unspecified C61 Malignant neoplasm of prostate Quantity: 1 Electronic Signature(s) Signed: 05/27/2022 1:34:19 PM By: GValeria BatmanEMT Signed: 05/27/2022 5:47:10 PM By: RLinton HamMD Entered By: GValeria Batmanon 05/27/2022 13:34:18

## 2022-05-28 NOTE — Progress Notes (Addendum)
Dillon, [name] ([id number]) [id number] Page 1 of 2 Visit Report for 05/28/2022 HBO Details Patient Name: Date of Service: [name] MES E. 05/28/2022 8:00 A M Medical Record Number: [id number] Patient Account Number: [id number] Date of Birth/Sex: Treating RN: 1966-03-30 (56 y.o. [name] Primary Care [name]: [name] Other Clinician: Donavan [name] Referring [name]: Treating [name]/Extender: [name] in Treatment: 8 HBO Treatment Course Details Treatment Course Number: 1 Ordering [name]: [name] T Treatments Ordered: otal 40 HBO Treatment Start Date: 04/09/2022 HBO Indication: Late Effect of Radiation HBO Treatment Details Treatment Number: 32 Patient Type: Outpatient Chamber Type: Monoplace Chamber Serial #: [id number] Treatment Protocol: 2.0 ATA with 90 minutes oxygen, and no air breaks Treatment Details Compression Rate Down: 1.5 psi / minute De-Compression Rate Up: 1.5 psi / minute Air breaks and breathing Decompress Decompress Compress Tx Pressure Begins Reached periods Begins Ends (leave unused spaces blank) Chamber Pressure (ATA 1 2 ------2 1 ) Clock Time (24 hr) 08:12 08:22 - - - - - - 09:52 10:02 Treatment Length: 110 (minutes) Treatment Segments: 4 Vital Signs Capillary Blood Glucose Reference Range: 80 - 120 mg / dl HBO Diabetic Blood Glucose Intervention Range: <131 mg/dl or >249 mg/dl Type: Time Vitals Blood Respiratory Capillary Blood Glucose Pulse Action Pulse: Temperature: Taken: Pressure: Rate: Glucose (mg/dl): Meter #: Oximetry (%) Taken: Pre 08:06 157/70 88 18 97.9 160 1 none per protocol Post 10:04 157/123 77 18 97.2 138 1 re-measured BP. Post 10:05 178/83 none per protocol. Treatment Response Treatment Toleration: Well Treatment Completion Status: Treatment Completed without Adverse Event Treatment Notes Patient arrived and prepared for treatment.  Patient states that he ate a breakfast burrito this morning. Patient tolerated compression of the chamber at 1 psi/min until reaching 3 psig and then increased rate set at 2 psi/min until reaching treatment depth of 2 ATA. [name] tolerated treatment well and subsequent decompression of the chamber at a rate of 2 psi/min. Post treatment BP was 157/123 mmHg initially. Since this was outside what is typical for his blood pressure I repeated BP check with dinamap machine resulting in 178/83 mmHg. Patient stated that he felt fine. Patient was stable upon discharge. [name] Notes No concerns with treatment given Physician HBO Attestation: I certify that I supervised this HBO treatment in accordance with Medicare guidelines. A trained emergency response team is readily available per Yes hospital policies and procedures. Continue HBOT as ordered. Yes Electronic Signature(s) [name] ([id number]) [id number] Page 2 of 2 Signed: 06/01/2022 7:37:54 AM By: Dillon Ham MD Previous Signature: 05/29/2022 9:43:26 AM Version By: [name] CHT EMT BS , , Previous Signature: 06/01/2022 7:29:51 AM Version By: Dillon Ham MD Previous Signature: 05/28/2022 2:10:50 PM Version By: [name] CHT EMT BS , , Entered By: [name] on 06/01/2022 07:36:05 -------------------------------------------------------------------------------- HBO Safety Checklist Details Patient Name: Date of Service: [name] MES E. 05/28/2022 8:00 A M Medical Record Number: [id number] Patient Account Number: [id number] Date of Birth/Sex: Treating RN: 05/04/1966 (56 y.o. [name] Primary Care Khamron Gellert: [name] Other Clinician: Donavan [name] Referring Ashlynne Shetterly: Treating Karigan Cloninger/Extender: [name] in Treatment: 8 [name] Safety Checklist Consent Form Signed Patient voided / foley secured and emptied When  did you last eato 0630 Last dose of injectable or oral agent 0700 Ostomy pouch emptied and vented if applicable NA All implantable devices assessed, documented and approved NA Intravenous access site secured and place NA Valuables  secured Linens and cotton and cotton/polyester blend (less than 51% polyester) Personal oil-based products / skin lotions / body lotions removed Wigs or hairpieces removed NA Smoking or tobacco materials removed NA Books / newspapers / magazines / loose paper removed Cologne, aftershave, perfume and deodorant removed Jewelry removed (may wrap wedding band) Make-up removed NA Hair care products removed Battery operated devices (external) removed Heating patches and chemical warmers removed Titanium eyewear removed Nail polish cured greater than 10 hours Casting material cured greater than 10 hours Hearing aids removed Loose dentures or partials removed NA Prosthetics have been removed NA Patient demonstrates correct use of air break device (if applicable) Patient concerns have been addressed Patient grounding bracelet on and cord attached to chamber Specifics for Inpatients (complete in addition to above) Medication sheet sent with patient NA Intravenous medications needed or due during therapy sent with patient NA Drainage tubes (Mooreg. nasogastric tube or chest tube secured and vented) NA Endotracheal or Tracheotomy tube secured NA Cuff deflated of air and inflated with saline NA Airway suctioned NA Notes Paper version used prior to treatment. Electronic Signature(s) Signed: 05/28/2022 2:06:58 PM By: [name] CHT EMT BS , , Entered By: [name] on 05/28/2022 14:06:57

## 2022-05-29 ENCOUNTER — Encounter (HOSPITAL_BASED_OUTPATIENT_CLINIC_OR_DEPARTMENT_OTHER): Payer: BC Managed Care – PPO | Admitting: Internal Medicine

## 2022-05-29 DIAGNOSIS — N3041 Irradiation cystitis with hematuria: Secondary | ICD-10-CM | POA: Diagnosis not present

## 2022-05-29 LAB — GLUCOSE, CAPILLARY
Glucose-Capillary: 133 mg/dL — ABNORMAL HIGH (ref 70–99)
Glucose-Capillary: 122 mg/dL — ABNORMAL HIGH (ref 70–99)

## 2022-05-29 NOTE — Progress Notes (Signed)
NICKALUS, THORNSBERRY ([id number]) [id number] Page 1 of 2 Visit Report for 05/29/2022 Arrival Information Details Patient Name: Date of Service: [name] MES E. 05/29/2022 8:00 A M Medical Record Number: [id number] Patient Account Number: [id number] Date of Birth/Sex: Treating RN: [birth year] (56 y.o. [name] Primary Care Neaveh Belanger: [name] Other Clinician: [name] Referring Timarion Agcaoili: Treating Kailey Esquilin/Extender: Georgette Shell in Treatment: 8 Visit Information History Since Last Visit All ordered tests and consults were completed: Yes Patient Arrived: Ambulatory Added or deleted any medications: No Arrival Time: 07:46 Any new allergies or adverse reactions: No Accompanied By: None Had a fall or experienced change in No Transfer Assistance: None activities of daily living that may affect Patient Identification Verified: Yes risk of falls: Secondary Verification Process Completed: Yes Signs or symptoms of abuse/neglect since last visito No Patient Requires Transmission-Based Precautions: No Hospitalized since last visit: No Patient Has Alerts: No Implantable device outside of the clinic excluding No cellular tissue based products placed in the center since last visit: Pain Present Now: No Electronic Signature(s) Signed: 05/29/2022 11:08:14 AM By: [name] EMT Entered By: [name] on 05/29/2022 11:08:13 -------------------------------------------------------------------------------- Encounter Discharge Information Details Patient Name: Date of Service: [name] MES E. 05/29/2022 8:00 A M Medical Record Number: [id number] Patient Account Number: [id number] Date of Birth/Sex: Treating RN: [birth year] (56 y.o. [name] Primary Care Farhan Jean: [name] Other Clinician: [name] Referring Sitlaly Gudiel: Treating Mubarak Bevens/Extender: Georgette Shell in  Treatment: 8 Encounter Discharge Information Items Discharge Condition: Stable Ambulatory Status: Ambulatory Discharge Destination: Home Transportation: Private Auto Accompanied By: None Schedule Follow-up Appointment: Yes Clinical Summary of Care: Electronic Signature(s) Signed: 05/29/2022 11:14:57 AM By: [name] EMT Entered By: [name] on 05/29/2022 11:14:57 [name] ([id number]) [id number] Page 2 of 2 -------------------------------------------------------------------------------- Vitals Details Patient Name: Date of Service: [name] MES E. 05/29/2022 8:00 A M Medical Record Number: [id number] Patient Account Number: [id number] Date of Birth/Sex: Treating RN: 11-26-1965 (56 y.o. [name] Primary Care Leeanna Slaby: [name] Other Clinician: [name] Referring Mylon Mabey: Treating Neiman Roots/Extender: Georgette Shell in Treatment: 8 Vital Signs Time Taken: 08:15 Temperature (F): 97.3 Height (in): 70 Pulse (bpm): 90 Weight (lbs): 210 Respiratory Rate (breaths/min): 16 Body Mass Index (BMI): 30.1 Blood Pressure (mmHg): 145/66 Capillary Blood Glucose (mg/dl): 133 Reference Range: 80 - 120 mg / dl Electronic Signature(s) Signed: 05/29/2022 11:08:55 AM By: [name] EMT Entered By: [name] on 05/29/2022 11:08:55

## 2022-05-29 NOTE — Progress Notes (Addendum)
ABISHAI, VIEGAS ([id number]) [id number] Page 1 of 2 Visit Report for 05/29/2022 HBO Details Patient Name: Date of Service: [name]. 05/29/2022 8:00 A M Medical Record Number: [id number] Patient Account Number: [id number] Date of Birth/Sex: Treating RN: 06/18/1965 (56 y.o. [name] Primary Care Lauryn Lizardi: [name] Other Clinician: [name] Referring Salimata Christenson: Treating Jonte Wollam/Extender: Georgette Shell in Treatment: 8 HBO Treatment Course Details Treatment Course Number: 1 Ordering Emmerson Shuffield: [name] T Treatments Ordered: otal 40 HBO Treatment Start Date: 04/09/2022 HBO Indication: Late Effect of Radiation HBO Treatment Details Treatment Number: 33 Patient Type: Outpatient Chamber Type: Monoplace Chamber Serial #: [id number] Treatment Protocol: 2.0 ATA with 90 minutes oxygen, and no air breaks Treatment Details Compression Rate Down: 2.0 psi / minute De-Compression Rate Up: 2.0 psi / minute Air breaks and breathing Decompress Decompress Compress Tx Pressure Begins Reached periods Begins Ends (leave unused spaces blank) Chamber Pressure (ATA 1 2 ------2 1 ) Clock Time (24 hr) 08:22 08:30 - - - - - - 10:00 10:08 Treatment Length: 106 (minutes) Treatment Segments: 4 Vital Signs Capillary Blood Glucose Reference Range: 80 - 120 mg / dl HBO Diabetic Blood Glucose Intervention Range: <131 mg/dl or >249 mg/dl Time Vitals Blood Respiratory Capillary Blood Glucose Pulse Action Type: Pulse: Temperature: Taken: Pressure: Rate: Glucose (mg/dl): Meter #: Oximetry (%) Taken: Pre 08:15 145/66 90 16 97.3 133 Post 10:11 195/76 79 18 97.4 122 Treatment Response Treatment Toleration: Well Treatment Completion Status: Treatment Completed without Adverse Event Electronic Signature(s) Signed: 05/29/2022 11:13:16 AM By: [name] EMT Signed: 05/29/2022 4:40:59 PM By: Linton Ham MD Entered  By: [name] on 05/29/2022 11:13:16 -------------------------------------------------------------------------------- HBO Safety Checklist Details Patient Name: Date of Service: [name]. 05/29/2022 8:00 A Benna Dunks ([id number]) [id number].pdf Page 2 of 2 Medical Record Number: [id number] Patient Account Number: [id number] Date of Birth/Sex: Treating RN: 1966-03-24 (56 y.o. [name] Primary Care Josephanthony Tindel: [name] Other Clinician: [name] Referring Santiaga Butzin: Treating Blakelee Allington/Extender: Georgette Shell in Treatment: 8 [name] Safety Checklist Consent Form Signed Patient voided / foley secured and emptied When did you last eato 0700 Last dose of injectable or oral agent 0715 Ostomy pouch emptied and vented if applicable NA All implantable devices assessed, documented and approved NA Intravenous access site secured and place NA Valuables secured Linens and cotton and cotton/polyester blend (less than 51% polyester) Personal oil-based products / skin lotions / body lotions removed Wigs or hairpieces removed NA Smoking or tobacco materials removed Books / newspapers / magazines / loose paper removed Cologne, aftershave, perfume and deodorant removed Jewelry removed (may wrap wedding band) Make-up removed NA Hair care products removed Battery operated devices (external) removed Heating patches and chemical warmers removed Titanium eyewear removed NA Plastic frames Nail polish cured greater than 10 hours NA Casting material cured greater than 10 hours NA Hearing aids removed NA Loose dentures or partials removed NA Prosthetics have been removed NA Patient demonstrates correct use of air break device (if applicable) Patient concerns have been addressed Patient grounding bracelet on and cord attached to chamber Specifics for Inpatients (complete in addition to  above) Medication sheet sent with patient NA Intravenous medications needed or due during therapy sent with patient NA Drainage tubes ([name].g. nasogastric tube or chest tube secured and vented) NA Endotracheal or Tracheotomy tube secured NA Cuff deflated of air and inflated with saline NA Airway suctioned NA Notes The  safety checklist was done before the treatment was started. Electronic Signature(s) Signed: 05/29/2022 11:11:33 AM By: [name] EMT Entered By: [name] on 05/29/2022 11:11:32

## 2022-05-29 NOTE — Progress Notes (Signed)
MCCRAE, SPECIALE ([id number]) [id number] Page 1 of 2 Visit Report for 05/29/2022 Problem List Details Patient Name: Date of Service: Dillon Dun MES E. 05/29/2022 8:00 A M Medical Record Number: [id number] Patient Account Number: [id number] Date of Birth/Sex: Treating RN: [birth year] (56 y.o. 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 05/29/2022 Medical Record Number: [id number] Patient Account Number: [id number] Date of Birth/Sex: Treating RN: 13-Jun-1966 (56 y.o. [name] Primary Care Provider: [name] Other Clinician: [name] Referring Provider: Treating Provider/Extender: Georgette Shell in Treatment: 8 Diagnosis Coding ICD-10 Codes Code Description N30.41 Irradiation cystitis with hematuria [name], [name] ([id number]) [phone].pdf Page 2 of 2 R31.9 Hematuria, unspecified C61 Malignant neoplasm of prostate E10.3529 Type 1 diabetes mellitus with proliferative diabetic  retinopathy with traction retinal detachment involving the macula, unspecified eye Facility Procedures : CPT4 Code: [id number] Description: G0277-(Facility Use Only) HBOT full body chamber, 77mn , ICD-10 Diagnosis Description 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 Hematuria, unspecified C61 Malignant neoplasm of prostate Modifier: Quantity: 4 Physician Procedures : CPT4 Code Description Modifier [id number]- WC PHYS HYPERBARIC OXYGEN THERAPY ICD-10 Diagnosis Description 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 Hematuria, unspecified C61 Malignant neoplasm of prostate Quantity: 1 Electronic Signature(s) Signed: 05/29/2022 11:14:21 AM By: GValeria BatmanEMT Signed: 05/29/2022 4:40:59 PM By: RLinton HamMD Entered By: GValeria Batmanon 05/29/2022 11:14:21

## 2022-06-01 ENCOUNTER — Encounter (HOSPITAL_BASED_OUTPATIENT_CLINIC_OR_DEPARTMENT_OTHER): Payer: BC Managed Care – PPO | Admitting: Internal Medicine

## 2022-06-01 DIAGNOSIS — N3041 Irradiation cystitis with hematuria: Secondary | ICD-10-CM | POA: Diagnosis not present

## 2022-06-01 LAB — GLUCOSE, CAPILLARY
Glucose-Capillary: 141 mg/dL — ABNORMAL HIGH (ref 70–99)
Glucose-Capillary: 135 mg/dL — ABNORMAL HIGH (ref 70–99)

## 2022-06-01 NOTE — Progress Notes (Signed)
Dillon, [name] ([id number]) [id number] Page 1 of 2 Visit Report for 06/01/2022 HBO Details Patient Name: Date of Service: [name] MES E. 06/01/2022 8:00 A M Medical Record Number: [id number] Patient Account Number: [id number] Date of Birth/Sex: Treating RN: Oct 13, 1965 (56 y.o. [name] Primary Care Kendra Grissett: [name] Other Clinician: [name] Referring Jumaane Weatherford: Treating [name]/Extender: [name] in Treatment: 8 HBO Treatment Course Details Treatment Course Number: 1 Ordering Seaver Machia: [name] T Treatments Ordered: otal 40 HBO Treatment Start Date: 04/09/2022 HBO Indication: Late Effect of Radiation HBO Treatment Details Treatment Number: 34 Patient Type: Outpatient Chamber Type: Monoplace Chamber Serial #: [id number] Treatment Protocol: 2.0 ATA with 90 minutes oxygen, and no air breaks Treatment Details Compression Rate Down: 2.0 psi / minute De-Compression Rate Up: 2.0 psi / minute Air breaks and breathing Decompress Decompress Compress Tx Pressure Begins Reached periods Begins Ends (leave unused spaces blank) Chamber Pressure (ATA 1 2 ------2 1 ) Clock Time (24 hr) 08:08 08:18 - - - - - - 09:48 09:56 Treatment Length: 108 (minutes) Treatment Segments: 4 Vital Signs Capillary Blood Glucose Reference Range: 80 - 120 mg / dl HBO Diabetic Blood Glucose Intervention Range: <131 mg/dl or >249 mg/dl Time Vitals Blood Respiratory Capillary Blood Glucose Pulse Action Type: Pulse: Temperature: Taken: Pressure: Rate: Glucose (mg/dl): Meter #: Oximetry (%) Taken: Pre 07:56 154/54 96 18 98.1 141 Post 10:02 185/75 75 18 97.2 135 Treatment Response Treatment Toleration: Well Treatment Completion Status: Treatment Completed without Adverse Event Ovadia Lopp Notes No concerns with treatment given Physician HBO Attestation: I certify that I supervised this HBO treatment in accordance with  Medicare guidelines. A trained emergency response team is readily available per Yes hospital policies and procedures. Continue HBOT as ordered. Yes Electronic Signature(s) Signed: 06/01/2022 5:06:55 PM By: Linton Ham MD Previous Signature: 06/01/2022 1:59:34 PM Version By: [name] EMT Entered By: Linton Ham on 06/01/2022 17:00:04 [name] ([id number]) [id number] Page 2 of 2 -------------------------------------------------------------------------------- HBO Safety Checklist Details Patient Name: Date of Service: [name] MES E. 06/01/2022 8:00 A M Medical Record Number: [id number] Patient Account Number: [id number] Date of Birth/Sex: Treating RN: August 22, 1965 (56 y.o. [name] Primary Care Dierks Wach: [name] Other Clinician: [name] Referring Willmer Fellers: Treating Emaly Boschert/Extender: [name] in Treatment: 8 [name] Safety Checklist Consent Form Signed Patient voided / foley secured and emptied When did you last eato 0715 Last dose of injectable or oral agent 0700 Ostomy pouch emptied and vented if applicable NA All implantable devices assessed, documented and approved NA Intravenous access site secured and place NA Valuables secured Linens and cotton and cotton/polyester blend (less than 51% polyester) Personal oil-based products / skin lotions / body lotions removed Wigs or hairpieces removed NA Smoking or tobacco materials removed Books / newspapers / magazines / loose paper removed Cologne, aftershave, perfume and deodorant removed Jewelry removed (may wrap wedding band) Make-up removed NA Hair care products removed Battery operated devices (external) removed Heating patches and chemical warmers removed Titanium eyewear removed NA Plastic frames Nail polish cured greater than 10 hours NA Casting material cured greater than 10 hours NA Hearing aids  removed NA Loose dentures or partials removed NA Prosthetics have been removed NA Patient demonstrates correct use of air break device (if applicable) Patient concerns have been addressed Patient grounding bracelet on and cord attached to chamber Specifics for Inpatients (complete in addition to above) Medication sheet sent with patient  NA Intravenous medications needed or due during therapy sent with patient NA Drainage tubes (e.g. nasogastric tube or chest tube secured and vented) NA Endotracheal or Tracheotomy tube secured NA Cuff deflated of air and inflated with saline NA Airway suctioned NA Notes The safety checklist was done before the treatment was started. Electronic Signature(s) Signed: 06/01/2022 1:57:59 PM By: [name] EMT Entered By: [name] on 06/01/2022 13:57:59

## 2022-06-01 NOTE — Progress Notes (Signed)
DEKOTA, SHENK ([id number]) [id number] Page 1 of 2 Visit Report for 06/01/2022 Arrival Information Details Patient Name: Date of Service: [name] 06/01/2022 8:00 A M Medical Record Number: [id number] Patient Account Number: [id number] Date of Birth/Sex: Treating RN: 1965/09/05 (56 y.o. [name] Primary Care Jakerra Floyd: [name] Other Clinician: [name] Referring Nela Bascom: Treating Sakina Briones/Extender: Georgette Shell in Treatment: 8 Visit Information History Since Last Visit All ordered tests and consults were completed: Yes Patient Arrived: Ambulatory Added or deleted any medications: No Arrival Time: 07:45 Any new allergies or adverse reactions: No Accompanied By: None Had a fall or experienced change in No Transfer Assistance: None activities of daily living that may affect Patient Identification Verified: Yes risk of falls: Secondary Verification Process Completed: Yes Signs or symptoms of abuse/neglect since last visito No Patient Requires Transmission-Based Precautions: No Hospitalized since last visit: No Patient Has Alerts: No Implantable device outside of the clinic excluding No cellular tissue based products placed in the center since last visit: Pain Present Now: No Electronic Signature(s) Signed: 06/01/2022 1:56:15 PM By: [name] EMT Entered By: [name] on 06/01/2022 13:56:15 -------------------------------------------------------------------------------- Encounter Discharge Information Details Patient Name: Date of Service: [name] 06/01/2022 8:00 A M Medical Record Number: [id number] Patient Account Number: [id number] Date of Birth/Sex: Treating RN: 1965-11-12 (56 y.o. [name] Primary Care Quintina Hakeem: [name] Other Clinician: [name] Referring Rian Busche: Treating Ezequias Lard/Extender: Georgette Shell in  Treatment: 8 Encounter Discharge Information Items Discharge Condition: Stable Ambulatory Status: Ambulatory Discharge Destination: Home Transportation: Private Auto Accompanied By: None Schedule Follow-up Appointment: Yes Clinical Summary of Care: Electronic Signature(s) Signed: 06/01/2022 2:00:47 PM By: [name] EMT Entered By: [name] on 06/01/2022 14:00:47 [name] ([id number]) [id number] Page 2 of 2 -------------------------------------------------------------------------------- Vitals Details Patient Name: Date of Service: [name] 06/01/2022 8:00 A M Medical Record Number: [id number] Patient Account Number: [id number] Date of Birth/Sex: Treating RN: [birth year] (56 y.o. [name] Primary Care Kaydyn Chism: [name] Other Clinician: [name] Referring Tahirih Lair: Treating Beonka Amesquita/Extender: Georgette Shell in Treatment: 8 Vital Signs Time Taken: 07:56 Temperature (F): 98.1 Height (in): 70 Pulse (bpm): 96 Weight (lbs): 210 Respiratory Rate (breaths/min): 18 Body Mass Index (BMI): 30.1 Blood Pressure (mmHg): 154/54 Capillary Blood Glucose (mg/dl): 141 Reference Range: 80 - 120 mg / dl Electronic Signature(s) Signed: 06/01/2022 1:56:47 PM By: [name] EMT Entered By: [name] on 06/01/2022 13:56:47

## 2022-06-01 NOTE — Progress Notes (Signed)
JAYANTH, SZCZESNIAK ([id number]) [id number] Page 1 of 1 Visit Report for 05/28/2022 SuperBill Details Patient Name: Date of Service: [name] MES E. 05/28/2022 Medical Record Number: [id number] Patient Account Number: [id number] Date of Birth/Sex: Treating RN: [birth year] (56 y.o. 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, unspecified eye Facility Procedures CPT4 Code Description Modifier Quantity [id number] G0277-(Facility Use Only) HBOT full body chamber, 22mn , 4 ICD-10 Diagnosis Description 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 Hematuria, unspecified C61 Malignant neoplasm of prostate 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

## 2022-06-02 ENCOUNTER — Encounter (HOSPITAL_BASED_OUTPATIENT_CLINIC_OR_DEPARTMENT_OTHER): Payer: BC Managed Care – PPO | Admitting: General Surgery

## 2022-06-02 DIAGNOSIS — N3041 Irradiation cystitis with hematuria: Secondary | ICD-10-CM | POA: Diagnosis not present

## 2022-06-02 LAB — GLUCOSE, CAPILLARY
Glucose-Capillary: 117 mg/dL — ABNORMAL HIGH (ref 70–99)
Glucose-Capillary: 178 mg/dL — ABNORMAL HIGH (ref 70–99)
Glucose-Capillary: 203 mg/dL — ABNORMAL HIGH (ref 70–99)

## 2022-06-02 NOTE — Progress Notes (Signed)
SAHAS, SLUKA ([id number]) [id number] Page 1 of 2 Visit Report for 06/01/2022 Problem List Details Patient Name: Date of Service: [name] MES E. 06/01/2022 8:00 A M Medical Record Number: [id number] Patient Account Number: [id number] Date of Birth/Sex: Treating RN: 29-Aug-1965 (56 y.o. 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 06/01/2022 Medical Record Number: [id number] Patient Account Number: [id number] Date of Birth/Sex: Treating RN: 11-25-1965 (56 y.o. [name] Primary Care Provider: [name] Other Clinician: [name] Referring Provider: Treating Provider/Extender: Georgette Shell in Treatment: 8 Diagnosis Coding ICD-10 Codes Code Description N30.41 Irradiation cystitis with hematuria MARCIO, HOQUE ([id number]) [phone].pdf Page 2 of 2 R31.9 Hematuria, unspecified C61 Malignant neoplasm of prostate E10.3529 Type 1 diabetes mellitus with proliferative diabetic  retinopathy with traction retinal detachment involving the macula, unspecified eye Facility Procedures : CPT4 Code: [id number] Description: G0277-(Facility Use Only) HBOT full body chamber, 89mn , ICD-10 Diagnosis Description 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 Hematuria, unspecified C61 Malignant neoplasm of prostate Modifier: Quantity: 4 Physician Procedures : CPT4 Code Description Modifier [id number] 85462- WC PHYS HYPERBARIC OXYGEN THERAPY ICD-10 Diagnosis Description 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 Hematuria, unspecified C61 Malignant neoplasm of prostate Quantity: 1 Electronic Signature(s) Signed: 06/01/2022 2:00:06 PM By: GValeria BatmanEMT Signed: 06/01/2022 5:06:55 PM By: RLinton HamMD Entered By: GValeria Batmanon 06/01/2022 14:00:05

## 2022-06-02 NOTE — Progress Notes (Signed)
Dillon, [name] ([id number]) [id number] Page 1 of 2 Visit Report for 06/02/2022 HBO Details Patient Name: Date of Service: [name] MES E. 06/02/2022 8:00 A M Medical Record Number: [id number] Patient Account Number: [id number] Date of Birth/Sex: Treating RN: 1966/05/10 (56 y.o. [name], [place] [facility] [place]: [name] Other Clinician: [name] Referring Yousuf Ager: Treating Nikolette Reindl/Extender: [name] in Treatment: 9 HBO Treatment Course Details Treatment Course Number: 1 Ordering Angell Honse: [name] T Treatments Ordered: otal 40 HBO Treatment Start Date: 04/09/2022 HBO Indication: Late Effect of Radiation HBO Treatment Details Treatment Number: 35 Patient Type: Outpatient Chamber Type: Monoplace Chamber Serial #: [id number] Treatment Protocol: 2.0 ATA with 90 minutes oxygen, and no air breaks Treatment Details Compression Rate Down: 2.0 psi / minute De-Compression Rate Up: 2.0 psi / minute Air breaks and breathing Decompress Decompress Compress Tx Pressure Begins Reached periods Begins Ends (leave unused spaces blank) Chamber Pressure (ATA 1 2 ------2 1 ) Clock Time (24 hr) 08:42 08:49 - - - - - - 10:19 10:28 Treatment Length: 106 (minutes) Treatment Segments: 4 Vital Signs Capillary Blood Glucose Reference Range: 80 - 120 mg / dl HBO Diabetic Blood Glucose Intervention Range: <131 mg/dl or >249 mg/dl Type: Time Vitals Blood Pulse: Respiratory Capillary Blood Glucose Pulse Action Temperature: Taken: Pressure: Rate: Glucose (mg/dl): Meter #: Oximetry (%) Taken: Pre 08:03 117 Patient given 8 oz glucerna Post 10:36 174/85 79 20 97.2 203 Pre 08:03 164/72 95 20 97.2 Pre 08:35 178 Treatment Response Treatment Toleration: Well Treatment Completion Status: Treatment Completed without Adverse Event Treatment Notes At 0803 Blood sugar was 117. The Patient was given 8 oz Glucerna to  drink. At 0835 blood sugar was rechecked results 178. Treatment started. Physician HBO Attestation: I certify that I supervised this HBO treatment in accordance with Medicare guidelines. A trained emergency response team is readily available per Yes hospital policies and procedures. Continue HBOT as ordered. Yes Electronic Signature(s) Signed: 06/02/2022 12:03:36 PM By: [name] MD FACS Previous Signature: 06/02/2022 11:44:47 AM Version By: [name] EMT Entered By: [name] on 06/02/2022 12:03:36 [name] ([id number]) [id number] Page 2 of 2 -------------------------------------------------------------------------------- HBO Safety Checklist Details Patient Name: Date of Service: [name] MES E. 06/02/2022 8:00 A M Medical Record Number: [id number] Patient Account Number: [id number] Date of Birth/Sex: Treating RN: [birth year] (56 y.o. [name], [place] [facility] Mell Mellott: [name] Other Clinician: [name] Referring Tiegan Jambor: Treating Laurel Smeltz/Extender: [name] in Treatment: 9 [name] Safety Checklist Consent Form Signed Patient voided / foley secured and emptied When did you last eato 0712 Last dose of injectable or oral agent 0630 Ostomy pouch emptied and vented if applicable NA All implantable devices assessed, documented and approved NA Intravenous access site secured and place NA Valuables secured Linens and cotton and cotton/polyester blend (less than 51% polyester) Personal oil-based products / skin lotions / body lotions removed Wigs or hairpieces removed NA Smoking or tobacco materials removed Books / newspapers / magazines / loose paper removed Cologne, aftershave, perfume and deodorant removed Jewelry removed (may wrap wedding band) Make-up removed NA Hair care products removed Battery operated devices (external) removed Heating patches and  chemical warmers removed Titanium eyewear removed NA Plastic frames Nail polish cured greater than 10 hours NA Casting material cured greater than 10 hours NA Hearing aids removed NA Loose dentures or partials removed NA Prosthetics have been removed NA Patient demonstrates correct use of air  break device (if applicable) Patient concerns have been addressed Patient grounding bracelet on and cord attached to chamber Specifics for Inpatients (complete in addition to above) Medication sheet sent with patient NA Intravenous medications needed or due during therapy sent with patient NA Drainage tubes (e.g. nasogastric tube or chest tube secured and vented) NA Endotracheal or Tracheotomy tube secured NA Cuff deflated of air and inflated with saline NA Airway suctioned NA Notes The safety checklist was done before the treatment was started. Electronic Signature(s) Signed: 06/02/2022 11:40:00 AM By: [name] EMT Entered By: [name] on 06/02/2022 11:39:59

## 2022-06-02 NOTE — Progress Notes (Signed)
KORVIN, VALENTINE ([id number]) [id number] Page 1 of 2 Visit Report for 06/02/2022 Problem List Details Patient Name: Date of Service: Dillon Dun MES E. 06/02/2022 8:00 A M Medical Record Number: [id number] Patient Account Number: [id number] Date of Birth/Sex: Treating RN: 1966-03-20 (56 y.o. 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 06/02/2022 Medical Record Number: [id number] Patient Account Number: [id number] Date of Birth/Sex: Treating RN: [birth year] (56 y.o. [name], [name] Primary Care Provider: [name] Other Clinician: [name] Referring Provider: Treating Provider/Extender: [name] in Treatment: 9 Diagnosis Coding ICD-10 Codes Code Description N30.41 Irradiation cystitis with hematuria [name], [name] ([id number]) [phone].pdf Page 2 of 2 R31.9 Hematuria, unspecified C61 Malignant neoplasm of prostate E10.3529 Type 1 diabetes mellitus with proliferative  diabetic retinopathy with traction retinal detachment involving the macula, unspecified eye Facility Procedures : CPT4 Code Description: [id number]-(Facility Use Only) HBOT full body chamber, 83mn , ICD-10 Diagnosis Description 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 Hematuria, unspecified C61 Malignant neoplasm of prostate E10.3529 Type 1 diabetes  mellitus with proliferative diabetic retinopathy with traction retinal unspecified eye Modifier: detachment involvin Quantity: 4 g the macula, Physician Procedures : CPT4 Code Description Modifier [id number] PHYS HYPERBARIC OXYGEN THERAPY ICD-10 Diagnosis Description 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 Hematuria, unspecified C61 Malignant neoplasm of prostate E10.3529 Type 1 diabetes mellitus  with proliferative diabetic retinopathy with traction retinal detachment involvin unspecified eye Quantity: 1 g the macula, Electronic Signature(s) Signed: 06/02/2022 11:45:29 AM By: GValeria BatmanEMT Signed: 06/02/2022 12:03:08 PM By: CFredirick MaudlinMD FACS Entered By: GValeria Batmanon 06/02/2022 11:45:28

## 2022-06-02 NOTE — Progress Notes (Signed)
Dillon, [name] ([id number]) [id number] Page 1 of 2 Visit Report for 06/02/2022 Arrival Information Details Patient Name: Date of Service: [name] MES E. 06/02/2022 8:00 A M Medical Record Number: [id number] Patient Account Number: [id number] Date of Birth/Sex: Treating RN: 24-Sep-1965 (56 y.o. [name], [facility] Shakeyla Giebler: [name] Other Clinician: [name] Referring Auron Tadros: Treating Gizella Belleville/Extender: [name] in Treatment: 9 Visit Information History Since Last Visit All ordered tests and consults were completed: Yes Patient Arrived: Ambulatory Added or deleted any medications: No Arrival Time: 07:49 Any new allergies or adverse reactions: No Accompanied By: None Had a fall or experienced change in No Transfer Assistance: None activities of daily living that may affect Patient Identification Verified: Yes risk of falls: Secondary Verification Process Completed: Yes Signs or symptoms of abuse/neglect since last visito No Patient Requires Transmission-Based Precautions: No Hospitalized since last visit: No Patient Has Alerts: No Implantable device outside of the clinic excluding No cellular tissue based products placed in the center since last visit: Pain Present Now: No Electronic Signature(s) Signed: 06/02/2022 11:38:38 AM By: [name] EMT Entered By: [name] on 06/02/2022 11:38:37 -------------------------------------------------------------------------------- Encounter Discharge Information Details Patient Name: Date of Service: [name] MES E. 06/02/2022 8:00 A M Medical Record Number: [id number] Patient Account Number: [id number] Date of Birth/Sex: Treating RN: 1966/01/11 (56 y.o. [name], [facility] [name]: [name] Other Clinician: [name] Referring [name]: Treating [name]/Extender: [name] in  Treatment: 9 Encounter Discharge Information Items Discharge Condition: Stable Ambulatory Status: Ambulatory Discharge Destination: Home Transportation: Private Auto Accompanied By: None Schedule Follow-up Appointment: Yes Clinical Summary of Care: Electronic Signature(s) Signed: 06/02/2022 11:46:08 AM By: [name] EMT Entered By: [name] on 06/02/2022 11:46:08 [name] ([id number]) [id number] Page 2 of 2 -------------------------------------------------------------------------------- Vitals Details Patient Name: Date of Service: [name] MES E. 06/02/2022 8:00 A M Medical Record Number: [id number] Patient Account Number: [id number] Date of Birth/Sex: Treating RN: 1966-06-15 (56 y.o. [name] Primary Care Kel Senn: [name] Other Clinician: [name] Referring [name]: Treating Panda Crossin/Extender: [name] in Treatment: 9 Vital Signs Time Taken: 08:03 Capillary Blood Glucose (mg/dl): 117 Height (in): 70 Reference Range: 80 - 120 mg / dl Weight (lbs): 210 Body Mass Index (BMI): 30.1 Electronic Signature(s) Signed: 06/02/2022 11:38:52 AM By: [name] EMT Entered By: [name] on 06/02/2022 11:38:52

## 2022-06-03 ENCOUNTER — Encounter (HOSPITAL_BASED_OUTPATIENT_CLINIC_OR_DEPARTMENT_OTHER): Payer: BC Managed Care – PPO | Admitting: General Surgery

## 2022-06-03 DIAGNOSIS — N3041 Irradiation cystitis with hematuria: Secondary | ICD-10-CM | POA: Diagnosis not present

## 2022-06-03 LAB — GLUCOSE, CAPILLARY
Glucose-Capillary: 109 mg/dL — ABNORMAL HIGH (ref 70–99)
Glucose-Capillary: 142 mg/dL — ABNORMAL HIGH (ref 70–99)
Glucose-Capillary: 197 mg/dL — ABNORMAL HIGH (ref 70–99)

## 2022-06-03 NOTE — Progress Notes (Signed)
YANN, BIEHN ([id number]) [id number] Page 1 of 2 Visit Report for 06/03/2022 Arrival Information Details Patient Name: Date of Service: [name] MES E. 06/03/2022 8:00 A M Medical Record Number: [id number] Patient Account Number: [id number] Date of Birth/Sex: Treating RN: [birth year] (56 y.o. [name] Primary Care Scheryl Sanborn: [name] Other Clinician: [name] Referring Ethelean Colla: Treating Paislie Tessler/Extender: [name] in Treatment: 9 Visit Information History Since Last Visit All ordered tests and consults were completed: Yes Patient Arrived: Ambulatory Added or deleted any medications: No Arrival Time: 07:47 Any new allergies or adverse reactions: No Accompanied By: None Had a fall or experienced change in No Transfer Assistance: None activities of daily living that may affect Patient Identification Verified: Yes risk of falls: Secondary Verification Process Completed: Yes Signs or symptoms of abuse/neglect since last visito No Patient Requires Transmission-Based Precautions: No Hospitalized since last visit: No Patient Has Alerts: No Implantable device outside of the clinic excluding No cellular tissue based products placed in the center since last visit: Pain Present Now: No Electronic Signature(s) Signed: 06/03/2022 2:29:35 PM By: [name] EMT Entered By: [name] on 06/03/2022 14:29:34 -------------------------------------------------------------------------------- Encounter Discharge Information Details Patient Name: Date of Service: [name] MES E. 06/03/2022 8:00 A M Medical Record Number: [id number] Patient Account Number: [id number] Date of Birth/Sex: Treating RN: 03-31-1966 (56 y.o. [name] Primary Care Braya Habermehl: [name] Other Clinician: [name] Referring Honor Fairbank: Treating Deverick Pruss/Extender: [name] in Treatment:  9 Encounter Discharge Information Items Discharge Condition: Stable Ambulatory Status: Ambulatory Discharge Destination: Home Transportation: Private Auto Accompanied By: None Schedule Follow-up Appointment: Yes Clinical Summary of Care: Electronic Signature(s) Signed: 06/03/2022 2:39:34 PM By: [name] EMT Entered By: [name] on 06/03/2022 14:39:34 [name] ([id number]) [id number] Page 2 of 2 -------------------------------------------------------------------------------- Vitals Details Patient Name: Date of Service: [name] MES E. 06/03/2022 8:00 A M Medical Record Number: [id number] Patient Account Number: [id number] Date of Birth/Sex: Treating RN: 1965/12/01 (56 y.o. [name] Primary Care Shakera Ebrahimi: [name] Other Clinician: [name] Referring Vonetta Foulk: Treating Baylei Siebels/Extender: [name] in Treatment: 9 Vital Signs Time Taken: 08:05 Capillary Blood Glucose (mg/dl): 109 Height (in): 70 Reference Range: 80 - 120 mg / dl Weight (lbs): 210 Body Mass Index (BMI): 30.1 Electronic Signature(s) Signed: 06/03/2022 2:31:35 PM By: [name] EMT Entered By: [name] on 06/03/2022 14:31:35

## 2022-06-04 ENCOUNTER — Emergency Department (HOSPITAL_COMMUNITY)
Admission: EM | Admit: 2022-06-04 | Discharge: 2022-06-05 | Disposition: A | Discharge status: Home / Self Care | Payer: BC Managed Care – PPO | Attending: Emergency Medicine | Admitting: Emergency Medicine

## 2022-06-04 ENCOUNTER — Encounter (HOSPITAL_BASED_OUTPATIENT_CLINIC_OR_DEPARTMENT_OTHER): Payer: BC Managed Care – PPO | Admitting: General Surgery

## 2022-06-04 DIAGNOSIS — N3040 Irradiation cystitis without hematuria: Secondary | ICD-10-CM | POA: Insufficient documentation

## 2022-06-04 DIAGNOSIS — R0602 Shortness of breath: Secondary | ICD-10-CM | POA: Insufficient documentation

## 2022-06-04 DIAGNOSIS — Z8546 Personal history of malignant neoplasm of prostate: Secondary | ICD-10-CM | POA: Diagnosis not present

## 2022-06-04 DIAGNOSIS — R310 Gross hematuria: Secondary | ICD-10-CM | POA: Diagnosis present

## 2022-06-04 DIAGNOSIS — Z79899 Other long term (current) drug therapy: Secondary | ICD-10-CM | POA: Insufficient documentation

## 2022-06-04 DIAGNOSIS — R109 Unspecified abdominal pain: Secondary | ICD-10-CM | POA: Diagnosis not present

## 2022-06-04 DIAGNOSIS — Z794 Long term (current) use of insulin: Secondary | ICD-10-CM | POA: Insufficient documentation

## 2022-06-04 DIAGNOSIS — N3041 Irradiation cystitis with hematuria: Secondary | ICD-10-CM | POA: Diagnosis not present

## 2022-06-04 LAB — CBC WITH DIFFERENTIAL/PLATELET
Abs Immature Granulocytes: 0.03 10*3/uL (ref 0.00–0.07)
Basophils Absolute: 0 10*3/uL (ref 0.0–0.1)
Basophils Relative: 1 %
Eosinophils Absolute: 0.2 10*3/uL (ref 0.0–0.5)
Eosinophils Relative: 3 %
HCT: 27.1 % — ABNORMAL LOW (ref 39.0–52.0)
Hemoglobin: 9 g/dL — ABNORMAL LOW (ref 13.0–17.0)
Immature Granulocytes: 1 %
Lymphocytes Relative: 5 %
Lymphs Abs: 0.3 10*3/uL — ABNORMAL LOW (ref 0.7–4.0)
MCH: 32.5 pg (ref 26.0–34.0)
MCHC: 33.2 g/dL (ref 30.0–36.0)
MCV: 97.8 fL (ref 80.0–100.0)
Monocytes Absolute: 0.7 10*3/uL (ref 0.1–1.0)
Monocytes Relative: 11 %
Neutro Abs: 5.1 10*3/uL (ref 1.7–7.7)
Neutrophils Relative %: 79 %
Platelets: 304 10*3/uL (ref 150–400)
RBC: 2.77 MIL/uL — ABNORMAL LOW (ref 4.22–5.81)
RDW: 14.5 % (ref 11.5–15.5)
WBC: 6.3 10*3/uL (ref 4.0–10.5)
nRBC: 0 % (ref 0.0–0.2)

## 2022-06-04 LAB — BASIC METABOLIC PANEL
Anion gap: 7 (ref 5–15)
BUN: 19 mg/dL (ref 6–20)
CO2: 25 mmol/L (ref 22–32)
Calcium: 9.6 mg/dL (ref 8.9–10.3)
Chloride: 104 mmol/L (ref 98–111)
Creatinine, Ser: 0.94 mg/dL (ref 0.61–1.24)
GFR, Estimated: >60 mL/min (ref >60)
Glucose, Bld: 77 mg/dL (ref 70–99)
Potassium: 3.7 mmol/L (ref 3.5–5.1)
Sodium: 136 mmol/L (ref 135–145)

## 2022-06-04 LAB — GLUCOSE, CAPILLARY
Glucose-Capillary: 169 mg/dL — ABNORMAL HIGH (ref 70–99)
Glucose-Capillary: 154 mg/dL — ABNORMAL HIGH (ref 70–99)

## 2022-06-04 MED ORDER — HYDROMORPHONE HCL 1 MG/ML IJ SOLN
1.0000 mg | Freq: Once | INTRAMUSCULAR | Status: AC
  Administered 2022-06-05: 1 mg via INTRAVENOUS
  Filled 2022-06-04: qty 1

## 2022-06-04 MED ORDER — HYDROMORPHONE HCL 1 MG/ML IJ SOLN
1.0000 mg | Freq: Once | INTRAMUSCULAR | Status: AC
  Administered 2022-06-04: 1 mg via INTRAVENOUS
  Filled 2022-06-04: qty 1

## 2022-06-04 NOTE — Consult Note (Addendum)
Urology Consult   Physician requepsting consult: Marda Stalker, MD  Reason for consult: gross hematuria, clot retention  History of Present Illness: [name] is a 56 y.o. 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 Patient has an extensive history of hematuria and clot retention dating back over the past 2 months. He underwent clot evac and fulguration on 03/21/22. He more recently was seen in the ED on 05/20/22 with a similar issue for which a catheter was briefly placed and irrigated; he subsequently had a forceful bladder spasm that ejected his catheter and numerous clots.   Patient reports some mild hematuria over the last couple of weeks without clots. He noticed his first clot around 3pm today. He was able to void this out without difficulty, but subsequently developed inability to urinate completely over the next 3-4 hours. He called the Urology on-call and was instructed to present to the ED.  Shortly after arrival to the ED, patient spontaneously voided and expelled numerous clots. A photo of the voided clots is in the Media Tab. After clot expulsion, urine was a rose wine color and thin. His PVR was 45cc.  Labs notable for Hgb 9.0 from 9.6 on 05/20/22. Cr 0.94.   Past Medical History:  Diagnosis Date   Asthma    Cancer ([place])    prostate cancer    Diabetes mellitus without complication ([place])    History of bronchitis    Hypertension    Prostate cancer ([place])    Wears glasses     Past Surgical History:  Procedure Laterality Date   CYSTOSCOPY WITH FULGERATION N/A 03/21/2022   Procedure: CYSTOSCOPY WITH FULGERATION;  Surgeon: Janith Lima, MD;  Location: [facility];  Service: Urology;  Laterality: N/A;   EYE SURGERY     laser eye surgery bilat    left groin hernia      LYMPHADENECTOMY Bilateral 09/02/2015   Procedure: BILATERAL LYMPHADENECTOMY;  Surgeon:  Raynelle Bring, MD;  Location: [facility];  Service: Urology;  Laterality: Bilateral;   ROBOT ASSISTED LAPAROSCOPIC RADICAL PROSTATECTOMY N/A 09/02/2015   Procedure: XI ROBOTIC ASSISTED LAPAROSCOPIC RADICAL PROSTATECTOMY LEVEL 2;  Surgeon: Raynelle Bring, MD;  Location: [facility];  Service: Urology;  Laterality: N/A;   schwannoma     removed approx 16 to 18 years ago    [facility] Medications:  Home Meds:  No current facility-administered medications on file prior to encounter.   Current Outpatient Medications on File Prior to Encounter  Medication Sig Dispense Refill   ALPRAZolam (XANAX) 0.25 MG tablet Take 0.25 mg by mouth 3 (three) times daily as needed.     amLODipine (NORVASC) 5 MG tablet Take 5 mg by mouth daily.     Continuous Blood Gluc Sensor (FREESTYLE LIBRE 2 SENSOR) MISC USE TO MONITOR CBG EVERY 14 DAYS AS DIRECTED     Insulin Pen Needle (B-D UF III MINI PEN NEEDLES) 31G X 5 MM MISC USE TO INJECT INSULIN 4 TIMES DAILY E10.9     Insulin Syringe-Needle U-100 (B-D INS SYRINGE 0.5CC/31GX5/16) 31G X 5/16" 0.5 ML MISC use 1 syringe as directed (does 5 injections a day     LYUMJEV TEMPO PEN 100 UNIT/ML SOPN Inject into the skin.     ramipril (ALTACE) 10 MG capsule Take 10 mg by mouth daily.     rosuvastatin (CRESTOR) 5 MG  tablet Take 5 mg by mouth. Twice a week     TRESIBA FLEXTOUCH 200 UNIT/ML SOPN Inject 36 Units into the skin every morning.      XTANDI 40 MG tablet Take 160 mg by mouth at bedtime.       Scheduled Meds: Continuous Infusions: PRN Meds:.  Allergies: No Known Allergies  No family history on file.  Social History:  reports that he quit smoking about 23 years ago. His smoking use included cigarettes. He has a 3.50 pack-year smoking history. He has never used smokeless tobacco. He reports current alcohol use. He reports that he does not use drugs.  ROS: A complete review of systems was performed.  All systems are negative except for pertinent  findings as noted.  Physical Exam:  Vital signs in last 24 hours: Temp:  [98 F (36.7 C)] 98 F (36.7 C) (12/21 1835) Pulse Rate:  [88-103] 89 (12/21 2000) Resp:  [16-20] 19 (12/21 2000) BP: (177-229)/(81-93) 197/89 (12/21 2000) SpO2:  [98 %-100 %] 100 % (12/21 2000) Constitutional:  Alert and oriented, No acute distress Cardiovascular: Regular rate and rhythm, No JVD Respiratory: Normal respiratory effort, Lungs clear bilaterally GI: Abdomen is soft, nontender, nondistended, no abdominal masses GU: Urine light pink, voiding spontaneously Lymphatic: No lymphadenopathy Neurologic: Grossly intact, no focal deficits Psychiatric: Normal mood and affect  Laboratory Data:  Recent Labs    06/04/22 2000  WBC 6.3  HGB 9.0*  HCT 27.1*  PLT 304     Recent Labs    06/04/22 2000  NA 136  K 3.7  CL 104  GLUCOSE 77  BUN 19  CALCIUM 9.6  CREATININE 0.94      Results for orders placed or performed during the hospital encounter of 06/04/22 (from the past 24 hour(s))  Basic metabolic panel     Status: None   Collection Time: 06/04/22  8:00 PM  Result Value Ref Range   Sodium 136 135 - 145 mmol/L   Potassium 3.7 3.5 - 5.1 mmol/L   Chloride 104 98 - 111 mmol/L   CO2 25 22 - 32 mmol/L   Glucose, Bld 77 70 - 99 mg/dL   BUN 19 6 - 20 mg/dL   Creatinine, Ser 0.94 0.61 - 1.24 mg/dL   Calcium 9.6 8.9 - 10.3 mg/dL   GFR, Estimated >60 >60 mL/min   Anion gap 7 5 - 15  CBC with Differential     Status: Abnormal   Collection Time: 06/04/22  8:00 PM  Result Value Ref Range   WBC 6.3 4.0 - 10.5 K/uL   RBC 2.77 (L) 4.22 - 5.81 MIL/uL   Hemoglobin 9.0 (L) 13.0 - 17.0 g/dL   HCT 27.1 (L) 39.0 - 52.0 %   MCV 97.8 80.0 - 100.0 fL   MCH 32.5 26.0 - 34.0 pg   MCHC 33.2 30.0 - 36.0 g/dL   RDW 14.5 11.5 - 15.5 %   Platelets 304 150 - 400 K/uL   nRBC 0.0 0.0 - 0.2 %   Neutrophils Relative % 79 %   Neutro Abs 5.1 1.7 - 7.7 K/uL   Lymphocytes Relative 5 %   Lymphs Abs 0.3 (L) 0.7 - 4.0  K/uL   Monocytes Relative 11 %   Monocytes Absolute 0.7 0.1 - 1.0 K/uL   Eosinophils Relative 3 %   Eosinophils Absolute 0.2 0.0 - 0.5 K/uL   Basophils Relative 1 %   Basophils Absolute 0.0 0.0 - 0.1 K/uL   Immature Granulocytes 1 %  Abs Immature Granulocytes 0.03 0.00 - 0.07 K/uL   No results found for this or any previous visit (from the past 240 hour(s)).  Renal Function: Recent Labs    06/04/22 2000  CREATININE 0.94    CrCl cannot be calculated (Unknown ideal weight.).  Radiologic Imaging: No results found.   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 He was offered a catheter placement with manual irrigation to assess for additional clot burden. We discussed the catheter would allow [place] to completely free the bladder of clot and better assess his urine, though it could stir up more bleeding secondary to his hostile radiated bladder. We then discussed a trial without a catheter. This would allow for bladder and urethral rest, though would put him at risk of going into retention and returning to the ED. Ultimately, we opted to forego a catheter and discharge from the ED with strict return precautions.   Hgb 9.0 from 9.6 in early December. No need for transfusion or inpatient monitoring at this point, as I suspect the ongoing bleeding is slow. All questions were answered.  Patient will continue hyperbaric oxygen sessions. He is currently on session 37 of 40, with plans to extend to 60. We did briefly discuss options for management of radiation cystitis following completion of HBO, if that is unsuccessful. This includes cysto + fulguration, intravesical therapies (with varying ranges of side-effects), embolization, and, on the most extreme end, cystectomy with urinary diversion. This was a brief conversation but he found it helpful in informing him of  the available options if needed moving forward.  Legrand Como Daaron Dimarco 06/04/2022, 9:00 PM   ADDENDUM: As patient discharging, he noted intense bladder spasms and another clot per urethra. He subsequently remained for observation and was unable to void, with bladder scan 350cc. We elected to place a catheter. See separately dictated procedure note. He will discharge with foley catheter in place to drainage and follow-up in clinic for TOV. I will discuss with Dr. Alinda Money when he would like to see him back.

## 2022-06-04 NOTE — Progress Notes (Signed)
Dillon, [name] ([id number]) [id number] Page 1 of 2 Visit Report for 06/03/2022 HBO Details Patient Name: Date of Service: [name] MES E. 06/03/2022 8:00 A M Medical Record Number: [id number] Patient Account Number: [id number] Date of Birth/Sex: Treating RN: August 24, 1965 (56 y.o. [name] Primary Care Juron Vorhees: [name] Other Clinician: [name] Referring Sher Shampine: Treating Tekesha Almgren/Extender: [name] in Treatment: 9 HBO Treatment Course Details Treatment Course Number: 1 Ordering Capitola Ladson: [name] T Treatments Ordered: otal 40 HBO Treatment Start Date: 04/09/2022 HBO Indication: Late Effect of Radiation HBO Treatment Details Treatment Number: 36 Patient Type: Outpatient Chamber Type: Monoplace Chamber Serial #: [id number] Treatment Protocol: 2.0 ATA with 90 minutes oxygen, and no air breaks Treatment Details Compression Rate Down: 2.0 psi / minute De-Compression Rate Up: 2.0 psi / minute Air breaks and breathing Decompress Decompress Compress Tx Pressure Begins Reached periods Begins Ends (leave unused spaces blank) Chamber Pressure (ATA 1 2 ------2 1 ) Clock Time (24 hr) 08:42 08:51 - - - - - - 10:21 10:28 Treatment Length: 106 (minutes) Treatment Segments: 4 Vital Signs Capillary Blood Glucose Reference Range: 80 - 120 mg / dl HBO Diabetic Blood Glucose Intervention Range: <131 mg/dl or >249 mg/dl Type: Time Vitals Blood Pulse: Respiratory Capillary Blood Glucose Pulse Action Temperature: Taken: Pressure: Rate: Glucose (mg/dl): Meter #: Oximetry (%) Taken: Pre 08:05 109 Patient given 8 oz glucerna Post 10:33 169/83 83 18 97.2 197 Pre 08:37 121/61 84 18 97.9 142 Treatment Response Treatment Toleration: Well Treatment Completion Status: Treatment Completed without Adverse Event Treatment Notes At 0805 Blood sugar was 109. The Patient was given 8 oz Glucerna to drink. At 0837 blood  sugar was rechecked results 142. Treatment started. Physician HBO Attestation: I certify that I supervised this HBO treatment in accordance with Medicare guidelines. A trained emergency response team is readily available per Yes hospital policies and procedures. Continue HBOT as ordered. Yes Electronic Signature(s) Signed: 06/03/2022 4:53:57 PM By: [name] MD FACS Previous Signature: 06/03/2022 2:38:40 PM Version By: [name] EMT Entered By: [name] on 06/03/2022 16:53:57 [name] ([id number]) [id number] Page 2 of 2 -------------------------------------------------------------------------------- HBO Safety Checklist Details Patient Name: Date of Service: [name] MES E. 06/03/2022 8:00 A M Medical Record Number: [id number] Patient Account Number: [id number] Date of Birth/Sex: Treating RN: 02/11/1966 (56 y.o. [name] Primary Care Kenzo Ozment: [name] Other Clinician: [name] Referring Mallori Araque: Treating Wai Minotti/Extender: [name] in Treatment: 9 [name] Safety Checklist Consent Form Signed Patient voided / foley secured and emptied When did you last eato 0700 Last dose of injectable or oral agent 0630 Ostomy pouch emptied and vented if applicable NA All implantable devices assessed, documented and approved NA Intravenous access site secured and place NA Valuables secured Linens and cotton and cotton/polyester blend (less than 51% polyester) Personal oil-based products / skin lotions / body lotions removed Wigs or hairpieces removed NA Smoking or tobacco materials removed Books / newspapers / magazines / loose paper removed Cologne, aftershave, perfume and deodorant removed Jewelry removed (may wrap wedding band) Make-up removed NA Hair care products removed Battery operated devices (external) removed Heating patches and chemical warmers  removed Titanium eyewear removed NA Plastic frames Nail polish cured greater than 10 hours NA Casting material cured greater than 10 hours NA Hearing aids removed NA Loose dentures or partials removed NA Prosthetics have been removed NA Patient demonstrates correct use of air break device (  if applicable) Patient concerns have been addressed Patient grounding bracelet on and cord attached to chamber Specifics for Inpatients (complete in addition to above) Medication sheet sent with patient NA Intravenous medications needed or due during therapy sent with patient NA Drainage tubes (e.g. nasogastric tube or chest tube secured and vented) NA Endotracheal or Tracheotomy tube secured NA Cuff deflated of air and inflated with saline NA Airway suctioned NA Notes The safety checklist was done before the treatment was started. The patient stated that he had an orange, sausage egg and cheese to eat before coming in for his treatment. Electronic Signature(s) Signed: 06/03/2022 2:34:41 PM By: [name] EMT Entered By: [name] on 06/03/2022 14:34:41

## 2022-06-04 NOTE — ED Triage Notes (Signed)
Patient here from home reporting ongoing urology issues. Passing large clots. Urology advised to come to ED.

## 2022-06-04 NOTE — Progress Notes (Addendum)
REG, BIRCHER ([id number]) [id number] Page 1 of 2 Visit Report for 06/04/2022 HBO Details Patient Name: Date of Service: [name] Dillon E. 06/04/2022 8:00 A M Medical Record Number: [id number] Patient Account Number: [id number] Date of Birth/Sex: Treating RN: [birth year] (56 y.o. [name] Primary Care Talaysia Pinheiro: [name] Other Clinician: [name] Referring Jassica Zazueta: Treating Morissa Obeirne/Extender: [name] in Treatment: 9 HBO Treatment Course Details Treatment Course Number: 1 Ordering Sheretta Grumbine: [name] T Treatments Ordered: otal 40 HBO Treatment Start Date: 04/09/2022 HBO Indication: Late Effect of Radiation HBO Treatment Details Treatment Number: 37 Patient Type: Outpatient Chamber Type: Monoplace Chamber Serial #: [id number] Treatment Protocol: 2.0 ATA with 90 minutes oxygen, and no air breaks Treatment Details Compression Rate Down: 2.0 psi / minute De-Compression Rate Up: 2.0 psi / minute Air breaks and breathing Decompress Decompress Compress Tx Pressure Begins Reached periods Begins Ends (leave unused spaces blank) Chamber Pressure (ATA 1 2 ------2 1 ) Clock Time (24 hr) 08:16 08:25 - - - - - - 09:55 10:02 Treatment Length: 106 (minutes) Treatment Segments: 4 Vital Signs Capillary Blood Glucose Reference Range: 80 - 120 mg / dl HBO Diabetic Blood Glucose Intervention Range: <131 mg/dl or >249 mg/dl Time Vitals Blood Respiratory Capillary Blood Glucose Pulse Action Type: Pulse: Temperature: Taken: Pressure: Rate: Glucose (mg/dl): Meter #: Oximetry (%) Taken: Pre 07:50 169 Post 10:10 180/98 80 16 97.7 154 Pre 08:10 149/62 91 18 97.4 Treatment Response Treatment Toleration: Well Treatment Completion Status: Treatment Completed without Adverse Event Physician HBO Attestation: I certify that I supervised this HBO treatment in accordance with Medicare guidelines. A trained emergency  response team is readily available per Yes hospital policies and procedures. Continue HBOT as ordered. Yes Electronic Signature(s) Signed: 06/04/2022 12:30:54 PM By: [name] MD FACS Previous Signature: 06/04/2022 11:07:38 AM Version By: [name] EMT Entered By: [name] on 06/04/2022 12:30:54 [name] ([id number]) [id number] Page 2 of 2 -------------------------------------------------------------------------------- HBO Safety Checklist Details Patient Name: Date of Service: [name] Dillon E. 06/04/2022 8:00 A M Medical Record Number: [id number] Patient Account Number: [id number] Date of Birth/Sex: Treating RN: 07-30-1965 (56 y.o. [name], Meta.Reding Primary Care Colston Pyle: [name] Other Clinician: [name] Referring Cabot Cromartie: Treating Amila Callies/Extender: [name] in Treatment: 9 [name] Safety Checklist Consent Form Signed Patient voided / foley secured and emptied When did you last eato 0715 Last dose of injectable or oral agent 0700 Ostomy pouch emptied and vented if applicable NA All implantable devices assessed, documented and approved NA Intravenous access site secured and place NA Valuables secured Linens and cotton and cotton/polyester blend (less than 51% polyester) Personal oil-based products / skin lotions / body lotions removed Wigs or hairpieces removed NA Smoking or tobacco materials removed Books / newspapers / magazines / loose paper removed Cologne, aftershave, perfume and deodorant removed Jewelry removed (may wrap wedding band) Make-up removed NA Hair care products removed Battery operated devices (external) removed Heating patches and chemical warmers removed Titanium eyewear removed NA Nail polish cured greater than 10 hours NA Casting material cured greater than 10 hours NA Hearing aids removed NA Loose dentures or partials  removed NA Prosthetics have been removed NA Patient demonstrates correct use of air break device (if applicable) Patient concerns have been addressed Patient grounding bracelet on and cord attached to chamber Specifics for Inpatients (complete in addition to above) Medication sheet sent with patient NA Intravenous medications needed or due  during therapy sent with patient NA Drainage tubes (Mooreg. nasogastric tube or chest tube secured and vented) NA Endotracheal or Tracheotomy tube secured NA Cuff deflated of air and inflated with saline NA Airway suctioned NA Notes The safety checklist was done before the treatment was started. The patient stated that he had an orange, sausage egg and cheese to eat before coming in for his treatment. Electronic Signature(s) Signed: 06/04/2022 11:03:59 AM By: [name] EMT Entered By: [name] on 06/04/2022 11:03:59

## 2022-06-04 NOTE — Progress Notes (Signed)
CHRISTHOPER, BUSBEE ([id number]) [id number] Page 1 of 2 Visit Report for 06/04/2022 Problem List Details Patient Name: Date of Service: [name] MES E. 06/04/2022 8:00 A M Medical Record Number: [id number] Patient Account Number: [id number] Date of Birth/Sex: Treating RN: 07/24/1965 (56 y.o. 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 06/04/2022 Medical Record Number: [id number] Patient Account Number: [id number] Date of Birth/Sex: Treating RN: Feb 28, 1966 (56 y.o. [name] Primary Care Provider: [name] Other Clinician: [name] Referring Provider: Treating Provider/Extender: [name] in Treatment: 9 Diagnosis Coding ICD-10 Codes Code Description N30.41 Irradiation cystitis with hematuria KAIZEN, IBSEN ([id number]) [id number].pdf Page 2 of 2 R31.9 Hematuria, unspecified C61 Malignant neoplasm of prostate E10.3529 Type 1 diabetes mellitus with proliferative diabetic  retinopathy with traction retinal detachment involving the macula, unspecified eye Facility Procedures : CPT4 Code: 67011003 Description: G0277-(Facility Use Only) HBOT full body chamber, 41mn , ICD-10 Diagnosis Description 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 Hematuria, unspecified C61 Malignant neoplasm of prostate Modifier: Quantity: 4 Physician Procedures : CPT4 Code Description Modifier [id number]- [id number] PHYS HYPERBARIC OXYGEN THERAPY ICD-10 Diagnosis Description 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 Hematuria, unspecified C61 Malignant neoplasm of prostate Quantity: 1 Electronic Signature(s) Signed: 06/04/2022 11:07:54 AM By: GValeria BatmanEMT Signed: 06/04/2022 12:30:32 PM By: CFredirick MaudlinMD FACS Entered By: GValeria Batmanon 06/04/2022 11:07:54

## 2022-06-04 NOTE — ED Provider Notes (Signed)
[facility] DEPT Provider Note   CSN: [id number] Arrival date & time: 06/04/22  1829     History  Chief Complaint  Patient presents with   Hematuria    [name] is a 56 y.o. male.   Hematuria Associated symptoms include abdominal pain (resolved) and shortness of breath.  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  He is coming in today due to concern for a urethral blockage that was causing some abdominal pain, upon arrival to the ED, he spontaneously passed a large number of clots and large volume of urine and is feeling much better at the time of my assessment.  He reports that this has happened multiple times in the past.  He also notes that he has a history of anemia requiring blood transfusion.     Home Medications Prior to Admission medications   Medication Sig Start Date End Date Taking? Authorizing Provider  ALPRAZolam (XANAX) 0.25 MG tablet Take 0.25 mg by mouth 3 (three) times daily as needed. 03/17/22   [provider]  amLODipine (NORVASC) 5 MG tablet Take 5 mg by mouth daily. 06/23/21   [provider]  Continuous Blood Gluc Sensor (FREESTYLE LIBRE 2 SENSOR) MISC USE TO MONITOR CBG EVERY 14 DAYS AS DIRECTED 06/11/21   [provider]  Insulin Pen Needle (B-D UF III MINI PEN NEEDLES) 31G X 5 MM MISC USE TO INJECT INSULIN 4 TIMES DAILY E10.9 01/22/16   [provider]  Insulin Syringe-Needle U-100 (B-D INS SYRINGE 0.5CC/31GX5/16) 31G X 5/16" 0.5 ML MISC use 1 syringe as directed (does 5 injections a day 02/04/10   [provider]  LYUMJEV TEMPO PEN 100 UNIT/ML SOPN Inject into the skin. 02/05/22   [provider]  ramipril (ALTACE) 10 MG capsule Take 10 mg by mouth daily.    [provider]  rosuvastatin (CRESTOR) 5 MG tablet Take 5 mg by mouth. Twice a week    [provider]  TRESIBA FLEXTOUCH 200 UNIT/ML SOPN Inject 36 Units into  the skin every morning.  07/25/15   [provider]  XTANDI 40 MG tablet Take 160 mg by mouth at bedtime. 03/04/22   [provider]      Allergies    Patient has no known allergies.    Review of Systems   Review of Systems  Constitutional:  Negative for chills and fever.  Respiratory:  Positive for shortness of breath.   Gastrointestinal:  Positive for abdominal distention (resolved) and abdominal pain (resolved).  Genitourinary:  Positive for hematuria.    Physical Exam Updated Vital Signs BP (!) 184/87   Pulse 100   Temp 98.9 F (37.2 C) (Oral)   Resp 20   SpO2 99%  Physical Exam Vitals reviewed.  Constitutional:      General: He is not in acute distress. Cardiovascular:     Rate and Rhythm: Normal rate and regular rhythm.     Heart sounds: Murmur: I-II/VI systolic murmur.  Pulmonary:     Effort: Pulmonary effort is normal. No respiratory distress.     Breath sounds: Normal breath sounds. No wheezing or rales.  Abdominal:     General: Abdomen is flat. There is no distension.     Palpations: Abdomen is soft. There is no mass.     Tenderness: There is no abdominal tenderness.  Musculoskeletal:        General: No swelling or tenderness.  Skin:  General: Skin is warm and dry.  Neurological:     General: No focal deficit present.     Mental Status: He is alert.      ED Results / Procedures / Treatments   Labs (all labs ordered are listed, but only abnormal results are displayed) Labs Reviewed  CBC WITH DIFFERENTIAL/PLATELET - Abnormal; Notable for the following components:      Result Value   RBC 2.77 (*)    Hemoglobin 9.0 (*)    HCT 27.1 (*)    Lymphs Abs 0.3 (*)    All other components within normal limits  BASIC METABOLIC PANEL    EKG None  Radiology No results found.  Procedures Procedures    Medications Ordered in ED Medications  HYDROmorphone (DILAUDID) injection 1 mg (1 mg Intravenous Given 06/04/22 2102)    ED Course/  Medical Decision Making/ A&P                           Medical Decision Making Amount and/or Complexity of Data Reviewed Labs: ordered.   Patient feeling much better after spontaneously passing clots. This seems to be a recurrent issue for him, previously treated with catheterization and irrigation. Post-void residual bladder scan 58m. Discussed briefly with Dr. EBarbee Cough urology, who will come by to speak with him about possible irrigation/cath.  Patient declined cath with Dr. EBarbee Cough Hgb 9.0, relatively stable from recent baseline of 9.6. BMP without evidence of AKI or electrolyte disturbance.   Patient subsequently developed the urge to urinate and new abdominal pain. Given a dose of Dilaudid and encouraged to attempt a subsequent void. Patient trying PO fluids to fill bladder and help flush clot.   Patient signed out to Dr. TSherry Ruffingat the end of my shift.  Plan as of now is to allow him to trial spontaneous voiding and passing of clot.  Possible that his current pain represents bladder spasm and not true clot, will give him some time and see how he feels.  If he continues to have this urge and pain, Dr. EBarbee Coughwilling to come back to cath/irrigate.    Final Clinical Impression(s) / ED Diagnoses Final diagnoses:  Gross hematuria  Radiation cystitis    Rx / DC Orders ED Discharge Orders     None      BPearla Dubonnet MD    SEppie Gibson MD 06/04/22 2152    Tegeler, CGwenyth Allegra MD 06/06/22 [phone]

## 2022-06-04 NOTE — Progress Notes (Signed)
DELAINE, CANTER ([id number]) [id number] Page 1 of 2 Visit Report for 06/03/2022 Problem List Details Patient Name: Date of Service: Dillon Dun MES E. 06/03/2022 8:00 A M Medical Record Number: [id number] Patient Account Number: [id number] Date of Birth/Sex: Treating RN: [birth year] (56 y.o. 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 06/03/2022 Medical Record Number: [id number] Patient Account Number: [id number] Date of Birth/Sex: Treating RN: 1965/06/19 (56 y.o. [name] Primary Care Provider: [name] Other Clinician: [name] Referring Provider: Treating Provider/Extender: [name] in Treatment: 9 Diagnosis Coding ICD-10 Codes Code Description N30.41 Irradiation cystitis with hematuria RAFAN, SANDERS ([id number]) [phone].pdf Page 2 of 2 R31.9 Hematuria, unspecified C61 Malignant neoplasm of prostate E10.3529 Type 1 diabetes mellitus with proliferative diabetic  retinopathy with traction retinal detachment involving the macula, unspecified eye Facility Procedures : CPT4 Code: 33545625 Description: G0277-(Facility Use Only) HBOT full body chamber, 67mn , ICD-10 Diagnosis Description 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 Hematuria, unspecified C61 Malignant neoplasm of prostate Modifier: Quantity: 4 Physician Procedures : CPT4 Code Description Modifier [id number]- WC PHYS HYPERBARIC OXYGEN THERAPY ICD-10 Diagnosis Description 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 Hematuria, unspecified C61 Malignant neoplasm of prostate Quantity: 1 Electronic Signature(s) Signed: 06/03/2022 2:39:02 PM By: GValeria BatmanEMT Signed: 06/03/2022 4:53:29 PM By: CFredirick MaudlinMD FACS Entered By: GValeria Batmanon 06/03/2022 14:39:02

## 2022-06-04 NOTE — Progress Notes (Addendum)
JAXN, CHIQUITO ([id number]) [id number] Page 1 of 2 Visit Report for 06/04/2022 Arrival Information Details Patient Name: Date of Service: [name] MES E. 06/04/2022 8:00 A M Medical Record Number: [id number] Patient Account Number: [id number] Date of Birth/Sex: Treating RN: 1966-04-25 (56 y.o. [name], Meta.Reding Primary Care Oluwatimilehin Balfour: [name] Other Clinician: [name] Referring Tavyn Kurka: Treating Lashauna Arpin/Extender: [name] in Treatment: 9 Visit Information History Since Last Visit All ordered tests and consults were completed: Yes Patient Arrived: Ambulatory Added or deleted any medications: No Arrival Time: 07:48 Any new allergies or adverse reactions: No Accompanied By: None Had a fall or experienced change in No Transfer Assistance: None activities of daily living that may affect Patient Identification Verified: Yes risk of falls: Secondary Verification Process Completed: Yes Signs or symptoms of abuse/neglect since last visito No Patient Requires Transmission-Based Precautions: No Hospitalized since last visit: No Patient Has Alerts: No Implantable device outside of the clinic excluding No cellular tissue based products placed in the center since last visit: Pain Present Now: No Electronic Signature(s) Signed: 06/04/2022 10:58:55 AM By: [name] EMT Entered By: [name] on 06/04/2022 10:58:55 -------------------------------------------------------------------------------- Encounter Discharge Information Details Patient Name: Date of Service: [name] MES E. 06/04/2022 8:00 A M Medical Record Number: [id number] Patient Account Number: [id number] Date of Birth/Sex: Treating RN: [birth year] (56 y.o. [name] Primary Care Aaliyana Fredericks: [name] Other Clinician: [name] Referring Courtlyn Aki: Treating Amorie Rentz/Extender: [name] in Treatment:  9 Encounter Discharge Information Items Discharge Condition: Stable Ambulatory Status: Ambulatory Discharge Destination: Home Transportation: Private Auto Accompanied By: None Schedule Follow-up Appointment: Yes Clinical Summary of Care: Electronic Signature(s) Signed: 06/04/2022 11:08:34 AM By: [name] EMT Entered By: [name] on 06/04/2022 11:08:34 [name] ([id number]) [id number] Page 2 of 2 -------------------------------------------------------------------------------- Vitals Details Patient Name: Date of Service: [name] MES E. 06/04/2022 8:00 A M Medical Record Number: [id number] Patient Account Number: [id number] Date of Birth/Sex: Treating RN: 1966/04/15 (56 y.o. [name] Primary Care Mayu Ronk: [name] Other Clinician: [name] Referring Haskell Rihn: Treating Giovonni Poirier/Extender: [name] in Treatment: 9 Vital Signs Time Taken: 07:50 Capillary Blood Glucose (mg/dl): 169 Height (in): 70 Reference Range: 80 - 120 mg / dl Weight (lbs): 210 Body Mass Index (BMI): 30.1 Electronic Signature(s) Signed: 06/04/2022 11:02:34 AM By: [name] EMT Entered By: [name] on 06/04/2022 11:02:34

## 2022-06-04 NOTE — Discharge Instructions (Signed)
Mr. Daemyn, Gariepy you're feeling better. Be sure to follow-up with urology since this is an ongoing issue. Your hemoglobin was a bit low, but not too different from your last measurement. Your urologist and PCP should continue to monitor this.  [name], MD

## 2022-06-05 MED ORDER — LIDOCAINE HCL URETHRAL/MUCOSAL 2 % EX GEL
1.0000 | Freq: Once | CUTANEOUS | Status: AC
  Administered 2022-06-05: 1 via URETHRAL
  Filled 2022-06-05: qty 33

## 2022-06-05 NOTE — Procedures (Signed)
Foley Catheter Placement Note  Indications: 56 y.o. 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 He failed a trial of void and requested catheter placement for relief.  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  We injected lidocaine jelly per urethra prior to the procedure.  We then inserted a 20 [place] 3-way hematuria catheter per urethra but met resistance immediately proximal to the meatus. Male sounds were obtained to sequentially dilate from 18Fr to 22Fr. The 20 [place] 3-way then passed with ease. Of note, urine did begin draining from the catheter within the penile urethra once past the area of narrowing. The catheter was advanced further and easily passed into the bladder without any resistance at the bladder neck.  We achieved return of light pink urine and then proceeded to insert 10 mL of sterile water into the Foley balloon. The catheter was manually irrigated with return of minimal small flakes of clot. Urine was watermelon colored and thin. The catheter was attached to a drainage bag and secured with a StatLock.  Placement of the catheter had return of greater than 300 mL of light pink urine.               Complications: None; patient tolerated the procedure well.  Plan:   See same day Consult Note       Attending Attestation: Dr. Junious Silk was available.

## 2022-06-05 NOTE — ED Provider Notes (Signed)
  Provider Note MRN:  [id number]  Arrival date & time: 06/05/22    ED Course and Medical Decision Making  Assumed care from Dr. Sherry Ruffing at shift change.  Copious hematuria awaiting urology evaluation and recommendations.  1 AM update: Foley placed by urology, will follow-up in clinic.  Procedures  Final Clinical Impressions(s) / ED Diagnoses     ICD-10-CM   1. Gross hematuria  R31.0     2. 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 Be sure to follow-up with urology since this is an ongoing issue. Your hemoglobin was a bit low, but not too different from your last measurement. Your urologist and PCP should continue to monitor this.  [name], MD     Barth Kirks. [place] Small, [place] [email or web address]    Maudie Flakes, MD 06/05/22 [phone]

## 2022-06-09 ENCOUNTER — Encounter (HOSPITAL_BASED_OUTPATIENT_CLINIC_OR_DEPARTMENT_OTHER): Payer: BC Managed Care – PPO | Admitting: General Surgery

## 2022-06-09 DIAGNOSIS — N3041 Irradiation cystitis with hematuria: Secondary | ICD-10-CM | POA: Diagnosis not present

## 2022-06-09 LAB — GLUCOSE, CAPILLARY
Glucose-Capillary: 191 mg/dL — ABNORMAL HIGH (ref 70–99)
Glucose-Capillary: 170 mg/dL — ABNORMAL HIGH (ref 70–99)

## 2022-06-09 NOTE — Progress Notes (Signed)
YANN, BIEHN ([id number]) [id number] Page 1 of 2 Visit Report for 06/09/2022 Problem List Details Patient Name: Date of Service: [name] MES E. 06/09/2022 9:00 A M Medical Record Number: [id number] Patient Account Number: [id number] Date of Birth/Sex: Treating RN: 1966-03-30 (56 y.o. 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 06/09/2022 Medical Record Number: [id number] Patient Account Number: [id number] Date of Birth/Sex: Treating RN: 10-02-1965 (56 y.o. [name] Primary Care Provider: [name] Other Clinician: [name] Referring Provider: Treating Provider/Extender: [name] in Treatment: 10 Diagnosis Coding ICD-10 Codes Code Description N30.41 Irradiation cystitis with hematuria AUDRIC, VENN ([id number]) [id number] Page 2 of 2 R31.9 Hematuria, unspecified C61 Malignant neoplasm of prostate E10.3529 Type 1 diabetes mellitus with proliferative diabetic  retinopathy with traction retinal detachment involving the macula, unspecified eye Facility Procedures : CPT4 Code: [id number] Description: G0277-(Facility Use Only) HBOT full body chamber, 52mn , ICD-10 Diagnosis Description 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 Hematuria, unspecified C61 Malignant neoplasm of prostate Modifier: Quantity: 4 Physician Procedures : CPT4 Code Description Modifier [id number] PHYS HYPERBARIC OXYGEN THERAPY ICD-10 Diagnosis Description 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

## 2022-06-09 NOTE — Progress Notes (Addendum)
TROI, FLORENDO ([id number]) [id number] Page 1 of 2 Visit Report for 06/09/2022 HBO Details Patient Name: Date of Service: Dillon Dun MES E. 06/09/2022 9:00 A M Medical Record Number: [id number] Patient Account Number: [id number] Date of Birth/Sex: Treating RN: [birth year] (56 y.o. [name] Primary Care Temara Lanum: [name] Other Clinician: [name] Referring Angad Nabers: Treating Nicoletta Hush/Extender: [name] in Treatment: 10 HBO Treatment Course Details Treatment Course Number: 1 Ordering Princella Jaskiewicz: [name] T Treatments Ordered: otal 60 HBO Treatment Start Date: 04/09/2022 HBO Indication: Late Effect of Radiation HBO Treatment Details Treatment Number: 38 Patient Type: Outpatient Chamber Type: Monoplace Chamber Serial #: [id number] Treatment Protocol: 2.0 ATA with 90 minutes oxygen, and no air breaks Treatment Details Compression Rate Down: 2.0 psi / minute De-Compression Rate Up: 2.0 psi / minute Air breaks and breathing Decompress Decompress Compress Tx Pressure Begins Reached periods Begins Ends (leave unused spaces blank) Chamber Pressure (ATA 1 2 ------2 1 ) Clock Time (24 hr) 09:10 09:18 - - - - - - 10:48 10:57 Treatment Length: 107 (minutes) Treatment Segments: 4 Vital Signs Capillary Blood Glucose Reference Range: 80 - 120 mg / dl HBO Diabetic Blood Glucose Intervention Range: <131 mg/dl or >249 mg/dl Time Vitals Blood Respiratory Capillary Blood Glucose Pulse Action Type: Pulse: Temperature: Taken: Pressure: Rate: Glucose (mg/dl): Meter #: Oximetry (%) Taken: Pre 08:35 172/90 90 18 98.3 Post 11:01 150/88 86 18 97.3 170 Pre 08:35 191 Treatment Response Treatment Toleration: Well Treatment Completion Status: Treatment Completed without Adverse Event Treatment Notes The patient was seen in the clinic before coming for treatment today. The safety checklist was done before the  treatment was started. Physician HBO Attestation: I certify that I supervised this HBO treatment in accordance with Medicare guidelines. A trained emergency response team is readily available per Yes hospital policies and procedures. Continue HBOT as ordered. Yes Electronic Signature(s) Signed: 06/09/2022 11:44:09 AM By: [name] MD FACS Previous Signature: 06/09/2022 11:24:45 AM Version By: [name] EMT Entered By: [name] on 06/09/2022 11:44:09 [name] ([id number]) [id number] Page 2 of 2 -------------------------------------------------------------------------------- HBO Safety Checklist Details Patient Name: Date of Service: Dillon Dun MES E. 06/09/2022 9:00 A M Medical Record Number: [id number] Patient Account Number: [id number] Date of Birth/Sex: Treating RN: [birth year] (56 y.o. [name] Primary Care Iris Hairston: [name] Other Clinician: [name] Referring Junie Avilla: Treating Reilyn Nelson/Extender: [name] in Treatment: 10 [name] Safety Checklist Consent Form Signed Patient voided / foley secured and emptied When did you last eato 0630 Last dose of injectable or oral agent 0645 Ostomy pouch emptied and vented if applicable NA All implantable devices assessed, documented and approved foley cath to bedside drain bag Intravenous access site secured and place NA Valuables secured Linens and cotton and cotton/polyester blend (less than 51% polyester) Personal oil-based products / skin lotions / body lotions removed Wigs or hairpieces removed NA Smoking or tobacco materials removed Books / newspapers / magazines / loose paper removed Cologne, aftershave, perfume and deodorant removed Jewelry removed (may wrap wedding band) Make-up removed NA Hair care products removed Battery operated devices (external) removed Heating patches and chemical warmers  removed Titanium eyewear removed NA Plastic frames Nail polish cured greater than 10 hours NA Casting material cured greater than 10 hours NA Hearing aids removed NA Loose dentures or partials removed NA Prosthetics have been removed NA Patient demonstrates correct use of air break device (if applicable) Patient concerns  have been addressed Patient grounding bracelet on and cord attached to chamber Specifics for Inpatients (complete in addition to above) Medication sheet sent with patient NA Intravenous medications needed or due during therapy sent with patient NA Drainage tubes (Mooreg. nasogastric tube or chest tube secured and vented) NA Endotracheal or Tracheotomy tube secured NA Cuff deflated of air and inflated with saline NA Airway suctioned NA Electronic Signature(s) Signed: 06/09/2022 11:20:24 AM By: [name] EMT Entered By: [name] on 06/09/2022 11:20:24

## 2022-06-09 NOTE — Progress Notes (Signed)
MANSUR, PATTI ([id number]) [id number] Page 1 of 8 Visit Report for 06/09/2022 Chief Complaint Document Details Patient Name: Date of Service: Dillon Dun MES E. 06/09/2022 8:00 A M Medical Record Number: [id number] Patient Account Number: [id number] Date of Birth/Sex: Treating RN: 1965/12/15 (56 y.o. M) Primary Care Provider: [name] Other Clinician: Referring Provider: Treating Provider/Extender: [name] in Treatment: 10 Information Obtained from: Patient Chief Complaint Patient presents to the [facility] for HBO eval due to radiation cystitis with hematuria Electronic Signature(s) Signed: 06/09/2022 8:54:47 AM By: [name] MD FACS Entered By: [name] on 06/09/2022 08:54:47 -------------------------------------------------------------------------------- HPI Details Patient Name: Date of Service: Dillon Dun MES E. 06/09/2022 8:00 A M Medical Record Number: [id number] Patient Account Number: [id number] Date of Birth/Sex: Treating RN: 1966/02/15 (56 y.o. M) Primary Care Provider: [name] Other Clinician: Referring Provider: Treating Provider/Extender: [name] in Treatment: 10 History of Present Illness HPI Description: ADMISSION 03/31/2022 This is a 56 year old male with a past medical history significant for type 1 diabetes mellitus and prostate cancer. He underwent a robotic prostatectomy with retroperitoneal lymph node dissection in 2017. He developed recurrence based upon biochemical data in 2019 and received salvage radiation therapy from October to December of that year. He received 45 Gy in 25 fractions of 1.8 Gy followed by a boost to the prostate bed for a total dose of 68.4 Gy. In February 2023, He developed oligometastatic recurrence to multiple lymph nodes and to bone. As result, he underwent additional radiation therapy including SBRT to the  right eighth rib and ultra hypofractionated radiotherapy to the periaortic and presacral lymph nodes. He received 50 Gy in 5 fractions to the right eighth rib and 50 Gy in 10 fractions to the periaortic presacral lymph nodes while delivering 30 Gy in 10 fractions to the involved echelons. After this treatment, he was placed on antihormonal therapy. He began experiencing intermittent gross hematuria with passage of dark clots in May 2023. In September 2023, he presented to the emergency department with urinary retention and frank blood from his penis. A Foley catheter was placed but this obstructed with blood. It was flushed and replaced. He subsequently was admitted to the hospital on March 20, 2022 after he removed his Foley catheter at home, feeling like it was clogged. He was experiencing daily hematuria with clots and became obstructed once again. A CT scan performed showed moderate intraluminal hemorrhage in the bladder. He was taken to the operating room the next day by urology who performed cystoscopy and clot evacuation with fulguration. The intraoperative findings included moderate clot burden, cystitis changes of the bladder mucosa and active bleeding from the trigone and posterior bladder wall. He also was anemic with a hemoglobin down to 6.5 g/dL on October 8, with a baseline normal hemoglobin at 12.9. He required transfusion to resolve this. 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 05/21/2022: 30-day HBO update. He has been tolerating his hyperbaric oxygen therapy without difficulty. Unfortunately, he continues to suffer with episodic hematuria and urinary retention secondary to clot burden. He has required multiple catheter-based interventions and upsizing of an indwelling Foley catheter to the point of a 19 [place], which was just removed yesterday. He is currently taking Pyridium to help with bladder spasms. 06/09/2022:  30-day HBO update. He had been doing very well without any episodes of bleeding until Thursday, when he went to the emergency  room due to inability to urinate. Ultimately, a new Foley catheter was placed and he is going to see urology after his HBO treatment today. 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 Electronic Signature(s) Signed: 06/09/2022 8:55:59 AM By: [name] MD FACS Entered By: [name] on 06/09/2022 08:55:59 [name] ([id number]) [id number] Page 2 of 8 -------------------------------------------------------------------------------- Physical Exam Details Patient Name: Date of Service: Dillon Dun MES E. 06/09/2022 8:00 A M Medical Record Number: [id number] Patient Account Number: [id number] Date of Birth/Sex: Treating RN: 10-05-1965 (56 y.o. M) Primary Care Provider: [name] Other Clinician: Referring Provider: Treating Provider/Extender: [name] in Treatment: 10 Constitutional He is hypertensive, but asymptomatic.. . . . No acute distress. Ears, Nose, Mouth, and Throat . Respiratory Normal work of breathing on room air. Clear to auscultation bilaterally. No wheezes, rales, or rhonchi.. Cardiovascular . Notes Foley catheter with cherry colored urine, no visible clots Electronic Signature(s) Signed: 06/09/2022 8:56:46 AM By: [name] MD FACS Entered By: [name] on 06/09/2022 08:56:46 -------------------------------------------------------------------------------- Physician Orders Details Patient Name: Date of Service: Dillon Dun MES E. 06/09/2022 8:00 A M Medical Record Number: [id number] Patient Account Number: [id number] Date of Birth/Sex: Treating RN: [birth year] (56 y.o. 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 - Dr. Celine Ahr Hyperbaric Oxygen Therapy Evaluate for HBO Therapy Indication: - Radiation Cystitis 2.0 ATA for 90 Minutes without A Breaks ir Total Number of Treatments: - 40+20=60 in total One treatments per day (delivered Monday through Friday unless otherwise specified in Special Instructions below): Finger stick Blood Glucose Pre- and Post- HBOT Treatment. Follow Hyperbaric Oxygen Glycemia Protocol [name], [name] ([id number]) [id number] Page 3 of 8 Afrin (Oxymetazoline HCL) 0.05% nasal spray - 1 spray in both nostrils daily as needed prior to HBO treatment for difficulty clearing ears GLYCEMIA INTERVENTIONS PROTOCOL PRE-HBO GLYCEMIA INTERVENTIONS ACTION INTERVENTION Obtain pre-HBO capillary blood glucose (ensure 1 physician order is in chart). A. Notify HBO physician and await physician orders. 2 If result is 70 mg/dl or below: B. If the result meets the hospital definition of a critical result, follow hospital policy. A. Give patient an 8 ounce Glucerna Shake, an 8 ounce Ensure, or 8 ounces of a Glucerna/Ensure equivalent dietary supplement*. B. Wait 30 minutes. If result is 71 mg/dl to 130 mg/dl: C. Retest patients capillary blood glucose (CBG). D. If result greater than or equal to 110 mg/dl, proceed with HBO. If result less than 110 mg/dl, notify HBO physician and consider holding HBO. If result is 131 mg/dl to 249 mg/dl: A. Proceed with HBO. A. Notify HBO physician and await physician orders. B. It is  recommended to hold HBO and do If result is 250 mg/dl or greater: blood/urine ketone testing. C. If the result meets the hospital definition of a critical result, follow hospital policy. POST-HBO GLYCEMIA INTERVENTIONS ACTION INTERVENTION Obtain post HBO capillary blood glucose (ensure 1 physician order is in chart). A. Notify HBO physician and await physician orders. 2 If result is 70 mg/dl or below: B. If the result meets the hospital  definition of a critical result, follow hospital policy. A. Give patient an 8 ounce Glucerna Shake, an 8 ounce Ensure, or 8 ounces of a Glucerna/Ensure equivalent dietary supplement*. B. Wait 15 minutes for symptoms of If result is 71 mg/dl to 100 mg/dl: hypoglycemia (i.e. nervousness, anxiety, sweating, chills, clamminess, irritability, confusion, tachycardia or dizziness). C. If patient asymptomatic, discharge patient. If patient symptomatic, repeat capillary blood glucose (CBG) and notify HBO physician. If result is 101 mg/dl to 249 mg/dl: A. Discharge patient. A. Notify HBO physician and await physician orders. B. It is recommended to do blood/urine ketone If result is 250 mg/dl or greater: testing. C. If the result meets the hospital definition of a critical result, follow hospital policy. *Juice or candies are NOT equivalent products. If patient refuses the Glucerna or Ensure, please consult the hospital dietitian for an appropriate substitute. Notes Placement of new Foley Catheter 06/04/22 Electronic Signature(s) Signed: 06/09/2022 8:57:01 AM By: [name] MD FACS Previous Signature: 06/09/2022 8:50:58 AM Version By: Dellie Catholic RN Entered By: [name] on 06/09/2022 08:57:01 -------------------------------------------------------------------------------- Problem List Details Patient Name: Date of Service: Dillon Dun MES E. 06/09/2022 8:00 A M Medical Record Number: [id number] Patient Account Number:  [id number] Date of Birth/Sex: Treating RN: 1966-02-07 (56 y.o. M) Primary Care Provider: [name] Other Clinician: Referring Provider: Treating Provider/Extender: [name] in Treatment: [address] 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 06/09/2022 8:00 A M Medical Record Number: [id number] Patient Account Number: [id number] Date of Birth/Sex: Treating RN: [birth year] (56 y.o. M) Primary Care Provider: [name] Other Clinician: Referring Provider: Treating Provider/Extender: [name] in Treatment: 10 Subjective Chief Complaint Information obtained from Patient Patient presents to the Wound Care center for HBO eval due to radiation cystitis with hematuria History of Present Illness (HPI) ADMISSION 03/31/2022 This is a 56 year old male with a past medical history significant for type 1 diabetes mellitus and prostate cancer. He underwent a robotic prostatectomy with retroperitoneal lymph node dissection in 2017. He developed recurrence based upon biochemical data in 2019 and received salvage radiation therapy from October to December of that year. He received 45 Gy in 25  fractions of 1.8 Gy followed by a boost to the prostate bed for a total dose of 68.4 Gy. In February 2023, He developed oligometastatic recurrence to multiple lymph nodes and to bone. As result, he underwent additional radiation therapy including SBRT to the right eighth rib and ultra hypofractionated radiotherapy to the periaortic and presacral lymph nodes. He received 50 Gy in 5 fractions to the right eighth rib and 50 Gy in 10 fractions to the periaortic presacral lymph nodes while delivering 30 Gy in 10 fractions to the involved echelons. After this treatment, he was placed on antihormonal therapy. He began experiencing intermittent gross hematuria with passage of dark clots in May 2023. In September 2023, he presented to the emergency department with urinary retention and frank blood from his penis.  A Foley catheter was placed but this obstructed with blood. It was flushed and replaced. He subsequently was admitted to the hospital on March 20, 2022 after he removed his Foley catheter at home, feeling like it was clogged. He was experiencing daily hematuria with clots and became obstructed once again. A CT scan performed showed moderate intraluminal hemorrhage in the bladder. He was taken to the operating room the next day by urology who performed cystoscopy and clot evacuation with fulguration. The intraoperative findings included moderate clot burden, cystitis changes of the bladder mucosa and active bleeding from the trigone and posterior bladder wall. He also was anemic with a hemoglobin down to 6.5 g/dL on October 8, with a baseline normal hemoglobin at 12.9. He required transfusion to resolve this. 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 05/21/2022: 30-day HBO update. He has been tolerating his hyperbaric oxygen therapy without difficulty. Unfortunately, he continues to suffer with episodic hematuria and urinary retention  secondary to clot burden. He has required multiple catheter-based interventions and upsizing of an indwelling Foley catheter to the point of a 62 [place], which was just removed yesterday. He is currently taking Pyridium to help with bladder spasms. 06/09/2022: 30-day HBO update. He had been doing very well without any episodes of bleeding until Thursday, when he went to the emergency room due to inability to urinate. Ultimately, a new Foley catheter was placed and he is going to see urology after his HBO treatment today. 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 Patient History Information obtained from Patient. Family History Unknown History. Social History Former smoker - started on 06/15/1998, Marital Status - Married, Alcohol Use - Moderate, Drug Use - No History, Caffeine Use - Daily. Medical History Respiratory Patient has history of Asthma - Uses Inhaler Cardiovascular Patient has history of Hypertension Endocrine Patient has history of Type I Diabetes - Takes Tresiba (Long acting) and Lyumjev (short acting) Oncologic Patient has history of Received Radiation - 08/27/21-09/09/21 Hospitalization/Surgery History - Cystoscopy and fulgeration (03/21/22); Schwannoma (2005);Eye surgery;Lymphadenectomy (2017); Robotic assisted Lap. Radical Protatectomy Level 2. Medical A Surgical History Notes nd Eyes BP:ZWCHENID Retinopathy Respiratory Hx: Bronchitis Sinusitis Rhinitis Cardiovascular Hardening of the aorta Genitourinary PO:EUMPN Hematuria Oncologic Prostate cancer Objective Constitutional He is hypertensive, but asymptomatic.Marland Kitchen No acute distress. Vitals Time Taken: 8:16 AM, Height: 70 in, Weight: 210 lbs, BMI: 30.1, Temperature: 98.3 F, Pulse: 90 bpm, Respiratory Rate: 18 breaths/min, Blood Pressure: 172/90 mmHg. Respiratory Normal work of  breathing on room air. Clear to auscultation bilaterally. No wheezes, rales, or rhonchi.. 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 - Dr. Celine Ahr Hyperbaric Oxygen Therapy: Evaluate for HBO Therapy Indication: - Radiation Cystitis 2.0 ATA for [address] Minutes without [address] KAZ, AULD ([id number]) [id number] Page 6 of 8 T Number of Treatments: - 40+20=60 in total otal One treatments per day (delivered Monday through Friday unless otherwise specified in Special Instructions below): Finger stick Blood Glucose Pre- and Post- HBOT Treatment. Follow Hyperbaric Oxygen Glycemia Protocol Afrin (Oxymetazoline HCL) 0.05% nasal spray - 1 spray in both nostrils daily as needed prior to HBO  treatment for difficulty clearing ears General Notes: Placement of new Foley Catheter 06/04/22 06/09/2022: Unfortunately, he had a renewed episode of hematuria with obstruction secondary to clots last Thursday. He has a new indwelling Foley catheter. I am going to extend for an additional 20 treatments, but if he continues to have refractory bleeding, I think he may end up requiring a cystectomy. Electronic Signature(s) Signed: 06/09/2022 8:58:28 AM By: [name] MD FACS Entered By: [name] on 06/09/2022 08:58:27 -------------------------------------------------------------------------------- HxROS Details Patient Name: Date of Service: Dillon Dun MES E. 06/09/2022 8:00 A M Medical Record Number: [id number] Patient Account Number: [id number] Date of Birth/Sex: Treating RN: 11/12/1965 (56 y.o. M) Primary Care Provider:  [name] Other Clinician: Referring Provider: Treating Provider/Extender: [name] in Treatment: 10 Information Obtained From Patient Eyes Medical History: Past Medical History Notes: [id number] Retinopathy Respiratory Medical History: Positive for: Asthma - Uses Inhaler Past Medical History Notes: Hx: Bronchitis Sinusitis Rhinitis Cardiovascular Medical History: Positive for: Hypertension Past Medical History Notes: Hardening of the aorta Endocrine Medical History: Positive for: Type I Diabetes - Takes Tresiba (Long acting) and Lyumjev (short acting) Time with diabetes: 31 years Treated with: Insulin Blood sugar tested every day: Yes Tested : Wears a Libre on right arm Blood sugar testing results: Breakfast: 88 Genitourinary Medical History: Past Medical History Notes: PJ:ASNKN Hematuria Oncologic [name], [name] ([id number]) [id number] Page 7 of 8 Medical History: Positive for: Received Radiation - 08/27/21-09/09/21 Past Medical History Notes: Prostate cancer Immunizations Pneumococcal Vaccine: Received Pneumococcal Vaccination: No Implantable Devices None Hospitalization / Surgery History Type of Hospitalization/Surgery Cystoscopy and fulgeration (03/21/22); Schwannoma (2005);Eye surgery;Lymphadenectomy (2017); Robotic assisted Lap. Radical Protatectomy Level 2 Family and Social History Unknown History: Yes; Former smoker - started on 06/15/1998; Marital Status - Married; Alcohol Use: Moderate; Drug Use: No History; Caffeine Use: Daily; Financial Concerns: No; Food, Clothing or Shelter Needs: No; Support System Lacking: No; Transportation Concerns: No Electronic Signature(s) Signed: 06/09/2022 10:10:54 AM By: [name] MD FACS Entered By: [name] on 06/09/2022 08:56:04 -------------------------------------------------------------------------------- SuperBill Details Patient Name:  Date of Service: Dillon Dun MES E. 06/09/2022 Medical Record Number: [id number] Patient Account Number: [id number] Date of Birth/Sex: Treating RN: 03/01/1966 (56 y.o. 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, unspecified eye Facility Procedures : CPT4 Code: [id number] Description: 26834 - WOUND CARE VISIT-LEV 2 EST PT Modifier: Quantity: 1 Physician Procedures : CPT4 Code Description Modifier [id number] - WC PHYS LEVEL 3 - EST PT ICD-10 Diagnosis Description 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 Hematuria, unspecified C61 Malignant neoplasm of prostate E10.3529 Type 1 diabetes mellitus with  proliferative diabetic retinopathy with traction retinal detachment involvi unspecified eye Quantity: 1 ng the macula, Electronic Signature(s) Signed: 06/10/2022 12:13:53 PM By: [name] MD FACS Signed: 06/10/2022 5:27:24 PM By: Dellie Catholic RN Previous Signature: 06/09/2022 8:58:41 AM Version By: [name] MD FACS [name], [name] ([id number]) [id number] Page 8 of 8 Entered By: Dellie Catholic on 06/10/2022 10:08:29

## 2022-06-09 NOTE — Progress Notes (Signed)
[name], [name] ([id number]) [id number] Page 1 of 6 Visit Report for 06/09/2022 Arrival Information Details Patient Name: Date of Service: Dillon Dun MES E. 06/09/2022 8:00 A M Medical Record Number: [id number] Patient Account Number: [id number] Date of Birth/Sex: Treating RN: March 04, 1966 (56 y.o. [name] Primary Care Milliana Reddoch: [name] Other Clinician: Referring Trinidad Petron: Treating Verle Wheeling/Extender: [name] in Treatment: 10 Visit Information History Since Last Visit Added or deleted any medications: No Patient Arrived: Ambulatory Any new allergies or adverse reactions: No Arrival Time: 08:15 Had a fall or experienced change in No Accompanied By: self activities of daily living that may affect Transfer Assistance: None risk of falls: Patient Identification Verified: Yes Signs or symptoms of abuse/neglect since last visito No Patient Requires Transmission-Based Precautions: No Hospitalized since last visit: No Patient Has Alerts: No Implantable device outside of the clinic excluding No cellular tissue based products placed in the center since last visit: Pain Present Now: Yes Electronic Signature(s) Signed: 06/09/2022 8:50:58 AM By: Dellie Catholic RN Entered By: Dellie Catholic on 06/09/2022 08:18:50 -------------------------------------------------------------------------------- Clinic Level of Care Assessment Details Patient Name: Date of Service: Dillon Dun MES E. 06/09/2022 8:00 A M Medical Record Number: [id number] Patient Account Number: [id number] Date of Birth/Sex: Treating RN: 07-13-1965 (56 y.o. [name] Primary Care Theone Bowell: [name] Other Clinician: Referring Natalea Sutliff: Treating Jarreau Callanan/Extender: [name] in Treatment: 10 Clinic Level of Care Assessment Items TOOL 4 Quantity Score X- 1 0 Use when only an EandM is performed on FOLLOW-UP  visit ASSESSMENTS - Nursing Assessment / Reassessment X- 1 10 Reassessment of Co-morbidities (includes updates in patient status) X- 1 5 Reassessment of Adherence to Treatment Plan ASSESSMENTS - Wound and Skin A ssessment / Reassessment '[]'$  - 0 Simple Wound Assessment / Reassessment - one wound '[]'$  - 0 Complex Wound Assessment / Reassessment - multiple wounds '[]'$  - 0 Dermatologic / Skin Assessment (not related to wound area) ASSESSMENTS - Focused Assessment '[]'$  - 0 Circumferential Edema Measurements - multi extremities '[]'$  - 0 Nutritional Assessment / Counseling / Intervention '[]'$  - 0 Lower Extremity Assessment (monofilament, tuning fork, pulses) [name], [name] ([id number]) [id number] Page 2 of 6 '[]'$  - 0 Peripheral Arterial Disease Assessment (using hand held doppler) ASSESSMENTS - Ostomy and/or Continence Assessment and Care '[]'$  - 0 Incontinence Assessment and Management '[]'$  - 0 Ostomy Care Assessment and Management (repouching, etc.) PROCESS - Coordination of Care X - Simple Patient / Family Education for ongoing care 1 15 '[]'$  - 0 Complex (extensive) Patient / Family Education for ongoing care X- 1 10 Staff obtains Programmer, systems, Records, T Results / Process Orders est X- 1 10 Staff telephones HHA, Nursing Homes / Clarify orders / etc '[]'$  - 0 Routine Transfer to another Facility (non-emergent condition) '[]'$  - 0 Routine Hospital Admission (non-emergent condition) '[]'$  - 0 New Admissions / Biomedical engineer / Ordering NPWT Apligraf, etc. , '[]'$  - 0 Emergency Hospital Admission (emergent condition) X- 1 10 Simple Discharge Coordination '[]'$  - 0 Complex (extensive) Discharge Coordination PROCESS - Special Needs '[]'$  - 0 Pediatric / Minor Patient Management '[]'$  - 0 Isolation Patient Management '[]'$  - 0 Hearing / Language / Visual special needs '[]'$  - 0 Assessment of Community assistance (transportation, D/C planning, etc.) '[]'$  - 0 Additional assistance /  Altered mentation '[]'$  - 0 Support Surface(s) Assessment (bed, cushion, seat, etc.) INTERVENTIONS - Wound Cleansing / Measurement '[]'$  - 0 Simple Wound Cleansing - one wound '[]'$  - 0 Complex  Wound Cleansing - multiple wounds '[]'$  - 0 Wound Imaging (photographs - any number of wounds) '[]'$  - 0 Wound Tracing (instead of photographs) '[]'$  - 0 Simple Wound Measurement - one wound '[]'$  - 0 Complex Wound Measurement - multiple wounds INTERVENTIONS - Wound Dressings '[]'$  - 0 Small Wound Dressing one or multiple wounds '[]'$  - 0 Medium Wound Dressing one or multiple wounds '[]'$  - 0 Large Wound Dressing one or multiple wounds '[]'$  - 0 Application of Medications - topical '[]'$  - 0 Application of Medications - injection INTERVENTIONS - Miscellaneous '[]'$  - 0 External ear exam '[]'$  - 0 Specimen Collection (cultures, biopsies, blood, body fluids, etc.) '[]'$  - 0 Specimen(s) / Culture(s) sent or taken to Lab for analysis '[]'$  - 0 Patient Transfer (multiple staff / Civil Service fast streamer / Similar devices) '[]'$  - 0 Simple Staple / Suture removal (25 or less) '[]'$  - 0 Complex Staple / Suture removal (26 or more) '[]'$  - 0 Hypo / Hyperglycemic Management (close monitor of Blood Glucose) '[]'$  - 0 Ankle / Brachial Index (ABI) - do not check if billed separately [name], [name] ([id number]) [id number] Page 3 of 6 X- 1 5 Vital Signs Has the patient been seen at the hospital within the last three years: Yes Total Score: 65 Level Of Care: New/Established - Level 2 Electronic Signature(s) Signed: 06/10/2022 5:27:24 PM By: Dellie Catholic RN Entered By: Dellie Catholic on 06/10/2022 10:08:20 -------------------------------------------------------------------------------- Encounter Discharge Information Details Patient Name: Date of Service: Dillon Dun MES E. 06/09/2022 8:00 A M Medical Record Number: [id number] Patient Account Number: [id number] Date of Birth/Sex: Treating RN: 07-30-1965 (56 y.o. [name] Primary Care Jersey Ravens[place]: [name] Other Clinician: Referring Janvi Ammar: Treating Rakeya Glab/Extender: [name] in Treatment: 10 Encounter Discharge Information Items Discharge Condition: Stable Ambulatory Status: Ambulatory Discharge Destination: Home Transportation: Private Auto Accompanied By: self Schedule Follow-up Appointment: Yes Clinical Summary of Care: Patient Declined Electronic Signature(s) Signed: 06/09/2022 8:50:58 AM By: Dellie Catholic RN Entered By: Dellie Catholic on 06/09/2022 08:43:12 -------------------------------------------------------------------------------- Lower Extremity Assessment Details Patient Name: Date of Service: Dillon Dun MES E. 06/09/2022 8:00 A M Medical Record Number: [id number] Patient Account Number: [id number] Date of Birth/Sex: Treating RN: 26-May-1966 (56 y.o. [name] Primary Care Becci Batty: [name] Other Clinician: Referring Mubarak Bevens: Treating Kentrail Shew/Extender: [name] in Treatment: 10 Electronic Signature(s) Signed: 06/09/2022 8:50:58 AM By: Dellie Catholic RN Entered By: Dellie Catholic on 06/09/2022 08:21:36 -------------------------------------------------------------------------------- Multi Wound Chart Details Patient Name: Date of Service: Dillon Dun MES E. 06/09/2022 8:00 A M Medical Record Number: [id number] Patient Account Number: [id number] Date of Birth/Sex: Treating RN: 1965/07/21 (56 y.o. [name], [name] ([id number]) [id number] Page 4 of 6 Primary Care Ellarie Picking: [name] Other Clinician: Referring Dalyn Becker: Treating Euline Kimbler/Extender: [name] in Treatment: 10 Vital Signs Height(in): 70 Pulse(bpm): 90 Weight(lbs): 210 Blood Pressure(mmHg): 172/90 Body Mass Index(BMI): 30.1 Temperature(F): 98.3 Respiratory Rate(breaths/min): 18 [Treatment Notes:Wound Assessments  Treatment Notes] Electronic Signature(s) Signed: 06/09/2022 8:54:39 AM By: [name] MD FACS Entered By: [name] on 06/09/2022 08:54:39 -------------------------------------------------------------------------------- Multi-Disciplinary Care Plan Details Patient Name: Date of Service: Dillon Dun MES E. 06/09/2022 8:00 A M Medical Record Number: [id number] Patient Account Number: [id number] Date of Birth/Sex: Treating RN: 27-Jul-1965 (56 y.o. 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 06/09/2022 8:00 A M Medical Record Number: [id number] Patient Account Number: [id number] Date of Birth/Sex: Treating RN: [birth year] (56 y.o. [name] Primary Care Han Vejar: [name] Other Clinician: Referring Dillian Feig: Treating Myer Bohlman/Extender: [name], [name] ([id number]) [id number] Page 5 of 6 Weeks in Treatment: 10 Active Problems Location of Pain Severity and Description of Pain Patient Has Paino Yes Site Locations Pain Location: Generalized Pain With Dressing  Change: No Duration of the Pain. Constant / Intermittento Constant Rate the pain. Current Pain Level: 4 Worst Pain Level: 10 Least Pain Level: 4 Tolerable Pain Level: 4 Character of Pain Describe the Pain: Dull Pain Management and Medication Current Pain Management: Medication: Yes Cold Application: No Rest: Yes Massage: No Activity: No T.MooreN.S.: No Heat Application: No Leg drop or elevation: No Is the Current Pain Management Adequate: Adequate How does your wound impact your activities of daily livingo Sleep: No Bathing: No Appetite: No Relationship With Others: No Bladder Continence: No Emotions: No Bowel Continence: No Work: No Toileting: No Drive: No Dressing: No Hobbies: No Electronic Signature(s) Signed: 06/09/2022 8:50:58 AM By: Dellie Catholic RN Entered By: Dellie Catholic on 06/09/2022 08:21:28 -------------------------------------------------------------------------------- Patient/Caregiver Education Details Patient Name: Date of Service: Dillon Dun MES E. 12/26/2023andnbsp8:00 A M Medical Record Number: [id number] Patient Account Number: [id number] Date of Birth/Gender: Treating RN: 1965-11-05 (56 y.o. [name] Primary Care Physician: [name] Other Clinician: Referring Physician: Treating Physician/Extender: [name] in Treatment: 10 Education Assessment Education Provided To: Patient Education Topics Provided Hyperbaric Oxygenation: [name], [name] ([id number]) [id number] Page 6 of 6 Handouts: Hyperbaric Oxygen Methods: Pharmacist, hospital) Signed: 06/09/2022 8:50:58 AM By: Dellie Catholic RN Entered By: Dellie Catholic on 06/09/2022 08:28:11 -------------------------------------------------------------------------------- Vitals Details Patient Name: Date of Service: Dillon Dun MES E. 06/09/2022 8:00 A M Medical Record Number: [id number] Patient Account  Number: [id number] Date of Birth/Sex: Treating RN: 06-10-1966 (56 y.o. [name] Primary Care Devine Dant: [name] Other Clinician: Referring Dawne Casali: Treating Zyann Mabry/Extender: [name] in Treatment: 10 Vital Signs Time Taken: 08:16 Temperature (F): 98.3 Height (in): 70 Pulse (bpm): 90 Weight (lbs): 210 Respiratory Rate (breaths/min): 18 Body Mass Index (BMI): 30.1 Blood Pressure (mmHg): 172/90 Reference Range: 80 - 120 mg / dl Electronic Signature(s) Signed: 06/09/2022 8:50:58 AM By: Dellie Catholic RN Entered By: Dellie Catholic on 06/09/2022 08:20:34

## 2022-06-09 NOTE — Progress Notes (Addendum)
DEYTON, ELLENBECKER ([id number]) [id number] Page 1 of 2 Visit Report for 06/09/2022 Arrival Information Details Patient Name: Date of Service: Dillon Dun MES E. 06/09/2022 9:00 A M Medical Record Number: [id number] Patient Account Number: [id number] Date of Birth/Sex: Treating RN: 1966-01-29 (56 y.o. [name] Primary Care Chamberlain Steinborn: [name] Other Clinician: [name] Referring Sheina Mcleish: Treating Neco Kling/Extender: [name] in Treatment: 10 Visit Information History Since Last Visit All ordered tests and consults were completed: Yes Patient Arrived: Ambulatory Added or deleted any medications: No Arrival Time: 08:15 Any new allergies or adverse reactions: No Accompanied By: None Had a fall or experienced change in No Transfer Assistance: None activities of daily living that may affect Patient Identification Verified: Yes risk of falls: Secondary Verification Process Completed: Yes Signs or symptoms of abuse/neglect since last visito No Patient Requires Transmission-Based Precautions: No Hospitalized since last visit: No Patient Has Alerts: No Implantable device outside of the clinic excluding Yes cellular tissue based products placed in the center since last visit: Pain Present Now: Yes Electronic Signature(s) Signed: 06/09/2022 11:20:39 AM By: [name] EMT Previous Signature: 06/09/2022 11:17:51 AM Version By: [name] EMT Entered By: [name] on 06/09/2022 11:20:39 -------------------------------------------------------------------------------- Encounter Discharge Information Details Patient Name: Date of Service: Dillon Dun MES E. 06/09/2022 9:00 A M Medical Record Number: [id number] Patient Account Number: [id number] Date of Birth/Sex: Treating RN: 01/05/1966 (56 y.o. [name] Primary Care Domingue Coltrain: [name] Other Clinician: [name] Referring  Edelmira Gallogly: Treating Lylie Blacklock/Extender: [name] in Treatment: 10 Encounter Discharge Information Items Discharge Condition: Stable Ambulatory Status: Ambulatory Discharge Destination: Home Transportation: Private Auto Accompanied By: None Schedule Follow-up Appointment: Yes Clinical Summary of Care: Electronic Signature(s) Signed: 06/09/2022 11:25:44 AM By: [name] EMT Entered By: [name] on 06/09/2022 11:25:44 [name] ([id number]) [id number] Page 2 of 2 -------------------------------------------------------------------------------- Vitals Details Patient Name: Date of Service: Dillon Dun MES E. 06/09/2022 9:00 A M Medical Record Number: [id number] Patient Account Number: [id number] Date of Birth/Sex: Treating RN: 10-16-1965 (56 y.o. [name] Primary Care Fredrick Dray: [name] Other Clinician: [name] Referring Jnae Thomaston: Treating Zubair Lofton/Extender: [name] in Treatment: 10 Vital Signs Time Taken: 08:35 Temperature (F): 98.3 Height (in): 70 Pulse (bpm): 90 Weight (lbs): 210 Respiratory Rate (breaths/min): 18 Body Mass Index (BMI): 30.1 Blood Pressure (mmHg): 172/90 Reference Range: 80 - 120 mg / dl Electronic Signature(s) Signed: 06/09/2022 11:19:02 AM By: [name] EMT Entered By: [name] on 06/09/2022 11:19:01

## 2022-06-10 ENCOUNTER — Encounter (HOSPITAL_BASED_OUTPATIENT_CLINIC_OR_DEPARTMENT_OTHER): Payer: BC Managed Care – PPO | Admitting: General Surgery

## 2022-06-10 DIAGNOSIS — N3041 Irradiation cystitis with hematuria: Secondary | ICD-10-CM | POA: Diagnosis not present

## 2022-06-10 LAB — GLUCOSE, CAPILLARY
Glucose-Capillary: 182 mg/dL — ABNORMAL HIGH (ref 70–99)
Glucose-Capillary: 187 mg/dL — ABNORMAL HIGH (ref 70–99)

## 2022-06-10 NOTE — Progress Notes (Addendum)
KAMIL, MCHAFFIE ([id number]) [id number] Page 1 of 2 Visit Report for 06/10/2022 HBO Details Patient Name: Date of Service: [name]. 06/10/2022 8:00 A M Medical Record Number: [id number] Patient Account Number: [id number] Date of Birth/Sex: Treating RN: June 20, 1965 (56 y.o. [name] Primary Care Thanh Pomerleau: [name] Other Clinician: [name] Referring Tashan Kreitzer: Treating Refael Fulop/Extender: [name] in Treatment: 10 HBO Treatment Course Details Treatment Course Number: 1 Ordering Eknoor Novack: [name] T Treatments Ordered: otal 60 HBO Treatment Start Date: 04/09/2022 HBO Indication: Late Effect of Radiation HBO Treatment Details Treatment Number: 39 Patient Type: Outpatient Chamber Type: Monoplace Chamber Serial #: [id number] Treatment Protocol: 2.0 ATA with 90 minutes oxygen, and no air breaks Treatment Details Compression Rate Down: 2.0 psi / minute De-Compression Rate Up: 2.0 psi / minute Air breaks and breathing Decompress Decompress Compress Tx Pressure Begins Reached periods Begins Ends (leave unused spaces blank) Chamber Pressure (ATA 1 2 ------2 1 ) Clock Time (24 hr) 08:14 08:22 - - - - - - 09:52 10:00 Treatment Length: 106 (minutes) Treatment Segments: 4 Vital Signs Capillary Blood Glucose Reference Range: 80 - 120 mg / dl HBO Diabetic Blood Glucose Intervention Range: <131 mg/dl or >249 mg/dl Time Vitals Blood Respiratory Capillary Blood Glucose Pulse Action Type: Pulse: Temperature: Taken: Pressure: Rate: Glucose (mg/dl): Meter #: Oximetry (%) Taken: Pre 08:09 145/60 99 18 97 182 Post 10:06 155/71 83 16 98 187 Treatment Response Treatment Toleration: Well Treatment Completion Status: Treatment Completed without Adverse Event Treatment Notes The patient stated that he removed his foley cath last night. Physician HBO Attestation: I certify that I supervised this HBO  treatment in accordance with Medicare guidelines. A trained emergency response team is readily available per Yes hospital policies and procedures. Continue HBOT as ordered. Yes Electronic Signature(s) Signed: 06/10/2022 3:08:32 PM By: [name] MD FACS Previous Signature: 06/10/2022 1:48:17 PM Version By: [name] EMT Entered By: [name] on 06/10/2022 15:08:32 [name] ([id number]) [id number] Page 2 of 2 -------------------------------------------------------------------------------- HBO Safety Checklist Details Patient Name: Date of Service: [name]. 06/10/2022 8:00 A M Medical Record Number: [id number] Patient Account Number: [id number] Date of Birth/Sex: Treating RN: [birth year] (56 y.o. [name] Primary Care Melani Brisbane: [name] Other Clinician: [name] Referring Pax Reasoner: Treating Eward Rutigliano/Extender: [name] in Treatment: 10 [name] Safety Checklist Consent Form Signed Patient voided / foley secured and emptied When did you last eato 0730 Last dose of injectable or oral agent 0700 Ostomy pouch emptied and vented if applicable NA All implantable devices assessed, documented and approved NA see note Intravenous access site secured and place NA Valuables secured Linens and cotton and cotton/polyester blend (less than 51% polyester) Personal oil-based products / skin lotions / body lotions removed Wigs or hairpieces removed NA Smoking or tobacco materials removed Books / newspapers / magazines / loose paper removed Cologne, aftershave, perfume and deodorant removed Jewelry removed (may wrap wedding band) Make-up removed NA Hair care products removed Battery operated devices (external) removed Heating patches and chemical warmers removed Titanium eyewear removed NA Nail polish cured greater than 10 hours NA Casting material cured greater  than 10 hours NA Hearing aids removed NA Loose dentures or partials removed NA Prosthetics have been removed NA Patient demonstrates correct use of air break device (if applicable) Patient concerns have been addressed Patient grounding bracelet on and cord attached to chamber Specifics for Inpatients (complete in addition  to above) Medication sheet sent with patient NA Intravenous medications needed or due during therapy sent with patient NA Drainage tubes ([name].g. nasogastric tube or chest tube secured and vented) NA Endotracheal or Tracheotomy tube secured NA Cuff deflated of air and inflated with saline NA Airway suctioned NA Notes The safety checklist was done before the treatment was started.. The patient stated that he had bacon, egg and cheese to eat this morning. The patient stated that he cut and removed the foley cath last night. Electronic Signature(s) Signed: 06/10/2022 1:44:27 PM By: [name] EMT Entered By: [name] on 06/10/2022 13:44:26

## 2022-06-10 NOTE — Progress Notes (Signed)
ERCEL, PEPITONE ([id number]) [id number] Page 1 of 2 Visit Report for 06/10/2022 Arrival Information Details Patient Name: Date of Service: Dillon Dun MES E. 06/10/2022 8:00 A M Medical Record Number: [id number] Patient Account Number: [id number] Date of Birth/Sex: Treating RN: 1965/10/29 (56 y.o. [name] Primary Care Tyyonna Soucy: [name] Other Clinician: [name] Referring Calleigh Lafontant: Treating Molly Maselli/Extender: [name] in Treatment: 10 Visit Information History Since Last Visit All ordered tests and consults were completed: Yes Patient Arrived: Ambulatory Added or deleted any medications: No Arrival Time: 07:56 Any new allergies or adverse reactions: No Accompanied By: None Had a fall or experienced change in No Transfer Assistance: None activities of daily living that may affect Patient Identification Verified: Yes risk of falls: Secondary Verification Process Completed: Yes Signs or symptoms of abuse/neglect since last visito No Patient Requires Transmission-Based Precautions: No Hospitalized since last visit: No Patient Has Alerts: No Implantable device outside of the clinic excluding No cellular tissue based products placed in the center since last visit: Pain Present Now: No Electronic Signature(s) Signed: 06/10/2022 1:40:26 PM By: [name] EMT Entered By: [name] on 06/10/2022 13:40:26 -------------------------------------------------------------------------------- Encounter Discharge Information Details Patient Name: Date of Service: Dillon Dun MES E. 06/10/2022 8:00 A M Medical Record Number: [id number] Patient Account Number: [id number] Date of Birth/Sex: Treating RN: 1965-07-24 (57 y.o. [name] Primary Care Keeon Zurn: [name] Other Clinician: [name] Referring Mckynleigh Mussell: Treating Dosha Broshears/Extender: [name] in  Treatment: 10 Encounter Discharge Information Items Discharge Condition: Stable Ambulatory Status: Ambulatory Discharge Destination: Home Transportation: Private Auto Accompanied By: None Schedule Follow-up Appointment: Yes Clinical Summary of Care: Electronic Signature(s) Signed: 06/10/2022 1:49:05 PM By: [name] EMT Entered By: [name] on 06/10/2022 13:49:05 [name] ([id number]) [id number] Page 2 of 2 -------------------------------------------------------------------------------- Vitals Details Patient Name: Date of Service: Dillon Dun MES E. 06/10/2022 8:00 A M Medical Record Number: [id number] Patient Account Number: [id number] Date of Birth/Sex: Treating RN: 1965/07/12 (56 y.o. [name] Primary Care Harun Brumley: [name] Other Clinician: [name] Referring Nadalie Laughner: Treating Olimpia Tinch/Extender: [name] in Treatment: 10 Vital Signs Time Taken: 08:09 Temperature (F): 97.0 Height (in): 70 Pulse (bpm): 99 Weight (lbs): 210 Respiratory Rate (breaths/min): 18 Body Mass Index (BMI): 30.1 Blood Pressure (mmHg): 145/60 Capillary Blood Glucose (mg/dl): 182 Reference Range: 80 - 120 mg / dl Electronic Signature(s) Signed: 06/10/2022 1:40:56 PM By: [name] EMT Entered By: [name] on 06/10/2022 13:40:56

## 2022-06-10 NOTE — Progress Notes (Signed)
DAMYN, WEITZEL ([id number]) [id number] Page 1 of 1 Visit Report for 06/10/2022 SuperBill Details Patient Name: Date of Service: [name]. 06/10/2022 Medical Record Number: [id number] Patient Account Number: [id number] Date of Birth/Sex: Treating RN: 1966/06/11 (56 y.o. 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, E10.3529 unspecified eye Facility Procedures CPT4 Code Description Modifier Quantity [id number] G0277-(Facility Use Only) HBOT full body chamber, 86mn , 4 ICD-10 Diagnosis Description 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 Hematuria, unspecified C61 Malignant neoplasm of prostate Physician Procedures Quantity CPT4 Code Description Modifier [id number]- WC PHYS HYPERBARIC OXYGEN THERAPY 1 ICD-10 Diagnosis Description 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 Hematuria, unspecified C61 Malignant neoplasm of prostate Electronic Signature(s) Signed: 06/10/2022 1:48:35 PM By: GValeria BatmanEMT Signed: 06/10/2022 3:07:18 PM By: CFredirick MaudlinMD FACS Entered By: GValeria Batmanon 06/10/2022 13:48:35

## 2022-06-11 ENCOUNTER — Encounter (HOSPITAL_BASED_OUTPATIENT_CLINIC_OR_DEPARTMENT_OTHER): Payer: BC Managed Care – PPO | Admitting: General Surgery

## 2022-06-11 DIAGNOSIS — N3041 Irradiation cystitis with hematuria: Secondary | ICD-10-CM | POA: Diagnosis not present

## 2022-06-11 LAB — GLUCOSE, CAPILLARY
Glucose-Capillary: 254 mg/dL — ABNORMAL HIGH (ref 70–99)
Glucose-Capillary: 249 mg/dL — ABNORMAL HIGH (ref 70–99)

## 2022-06-11 NOTE — Progress Notes (Signed)
[name] ([id number]) [phone].pdf Page 1 of 2 Visit Report for 06/11/2022 Arrival Information Details Patient Name: Date of Service: Dillon Dun MES E. 06/11/2022 8:00 A M Medical Record Number: [id number] Patient Account Number: [id number] Date of Birth/Sex: Treating RN: 06/04/1966 (56 y.o. [name] Primary Care [name]: [name] Other Clinician: Referring [name]: Treating [name]/Extender: [name] in Treatment: 10 Visit Information History Since Last Visit All ordered tests and consults were completed: Yes Patient Arrived: Ambulatory Added or deleted any medications: No Arrival Time: 07:47 Any new allergies or adverse reactions: No Accompanied By: None Had a fall or experienced change in No Transfer Assistance: None activities of daily living that may affect Patient Identification Verified: Yes risk of falls: Secondary Verification Process Completed: Yes Signs or symptoms of abuse/neglect since last visito No Patient Requires Transmission-Based Precautions: No Hospitalized since last visit: No Patient Has Alerts: No Implantable device outside of the clinic excluding No cellular tissue based products placed in the center since last visit: Pain Present Now: No Electronic Signature(s) Signed: 06/11/2022 2:08:03 PM By: [name] EMT Entered By: [name] on 06/11/2022 14:08:03 -------------------------------------------------------------------------------- Encounter Discharge Information Details Patient Name: Date of Service: Dillon Dun MES E. 06/11/2022 8:00 A M Medical Record Number: [id number] Patient Account Number: [id number] Date of Birth/Sex: Treating RN: 1965/11/13 (56 y.o. [name] Primary Care [name]: [name] Other Clinician: [name] Referring [name]: Treating [name] in Treatment: 10 Encounter  Discharge Information Items Discharge Condition: Stable Ambulatory Status: Ambulatory Discharge Destination: Home Transportation: Private Auto Accompanied By: None Schedule Follow-up Appointment: Yes Clinical Summary of Care: Electronic Signature(s) Signed: 06/11/2022 2:18:03 PM By: [name] EMT Entered By: [name] on 06/11/2022 14:18:03 [name] ([id number]) [id number] Page 2 of 2 -------------------------------------------------------------------------------- Vitals Details Patient Name: Date of Service: Dillon Dun MES E. 06/11/2022 8:00 A M Medical Record Number: [id number] Patient Account Number: [id number] Date of Birth/Sex: Treating RN: [birth year] (56 y.o. [name] Primary Care [name]: [name] Other Clinician: Referring [name]: Treating [name] in Treatment: 10 Vital Signs Time Taken: 08:04 Temperature (F): 97.9 Height (in): 70 Pulse (bpm): 90 Weight (lbs): 210 Respiratory Rate (breaths/min): 18 Body Mass Index (BMI): 30.1 Blood Pressure (mmHg): 146/68 Capillary Blood Glucose (mg/dl): 254 Reference Range: 80 - 120 mg / dl Electronic Signature(s) Signed: 06/11/2022 2:08:33 PM By: [name] EMT Entered By: [name] on 06/11/2022 14:08:33

## 2022-06-11 NOTE — Progress Notes (Addendum)
Dillon, [name] ([id number]) [id number] Page 1 of 2 Visit Report for 06/11/2022 HBO Details Patient Name: Date of Service: [name] MES E. 06/11/2022 8:00 A M Medical Record Number: [id number] Patient Account Number: [id number] Date of Birth/Sex: Treating RN: 1965/12/22 (56 y.o. [name] Primary Care Ihor Meinzer: [name] Other Clinician: [name] Referring Khloei Spiker: Treating [name]/Extender: [name] in Treatment: 10 HBO Treatment Course Details Treatment Course Number: 1 Ordering Dorien Mayotte: [name] T Treatments Ordered: otal 60 HBO Treatment Start Date: 04/09/2022 HBO Indication: Late Effect of Radiation HBO Treatment Details Treatment Number: 40 Patient Type: Outpatient Chamber Type: Monoplace Chamber Serial #: [id number] Treatment Protocol: 2.0 ATA with 90 minutes oxygen, and no air breaks Treatment Details Compression Rate Down: 2.0 psi / minute De-Compression Rate Up: 2.0 psi / minute Air breaks and breathing Decompress Decompress Compress Tx Pressure Begins Reached periods Begins Ends (leave unused spaces blank) Chamber Pressure (ATA 1 2 ------2 1 ) Clock Time (24 hr) 08:05 08:13 - - - - - - 09:43 09:51 Treatment Length: 106 (minutes) Treatment Segments: 4 Vital Signs Capillary Blood Glucose Reference Range: 80 - 120 mg / dl HBO Diabetic Blood Glucose Intervention Range: <131 mg/dl or >249 mg/dl Time Vitals Blood Respiratory Capillary Blood Glucose Pulse Action Type: Pulse: Temperature: Taken: Pressure: Rate: Glucose (mg/dl): Meter #: Oximetry (%) Taken: Pre 08:04 146/68 90 18 97.9 254 Post 09:58 149/85 78 18 98 249 Treatment Response Treatment Toleration: Well Treatment Completion Status: Treatment Completed without Adverse Event Physician HBO Attestation: I certify that I supervised this HBO treatment in accordance with Medicare guidelines. A trained emergency response  team is readily available per Yes hospital policies and procedures. Continue HBOT as ordered. Yes Electronic Signature(s) Signed: 06/11/2022 4:03:15 PM By: [name] MD FACS Previous Signature: 06/11/2022 2:16:51 PM Version By: [name] EMT Entered By: [name] on 06/11/2022 16:03:15 [name] ([id number]) [id number] Page 2 of 2 -------------------------------------------------------------------------------- HBO Safety Checklist Details Patient Name: Date of Service: [name] MES E. 06/11/2022 8:00 A M Medical Record Number: [id number] Patient Account Number: [id number] Date of Birth/Sex: Treating RN: 18-May-1966 (56 y.o. [name] Primary Care Tamber Burtch: [name] Other Clinician: [name] Referring Fenna Semel: Treating Trendon Zaring/Extender: [name] in Treatment: 10 [name] Safety Checklist Consent Form Signed Patient voided / foley secured and emptied When did you last eato 0700 Last dose of injectable or oral agent 0715 Ostomy pouch emptied and vented if applicable NA All implantable devices assessed, documented and approved NA Intravenous access site secured and place NA Valuables secured Linens and cotton and cotton/polyester blend (less than 51% polyester) Personal oil-based products / skin lotions / body lotions removed Wigs or hairpieces removed NA Smoking or tobacco materials removed Books / newspapers / magazines / loose paper removed Cologne, aftershave, perfume and deodorant removed Jewelry removed (may wrap wedding band) Make-up removed NA Hair care products removed Battery operated devices (external) removed Heating patches and chemical warmers removed Titanium eyewear removed NA Nail polish cured greater than 10 hours NA Casting material cured greater than 10 hours NA Hearing aids removed NA Loose dentures or partials  removed NA Prosthetics have been removed NA Patient demonstrates correct use of air break device (if applicable) Patient concerns have been addressed Patient grounding bracelet on and cord attached to chamber Specifics for Inpatients (complete in addition to above) Medication sheet sent with patient NA Intravenous medications needed or due during therapy  sent with patient NA Drainage tubes (e.g. nasogastric tube or chest tube secured and vented) NA Endotracheal or Tracheotomy tube secured NA Cuff deflated of air and inflated with saline NA Airway suctioned NA Notes The safety checklist was done before the treatment was started. Electronic Signature(s) Signed: 06/11/2022 2:10:28 PM By: [name] EMT Entered By: [name] on 06/11/2022 14:10:27

## 2022-06-11 NOTE — Progress Notes (Signed)
DYON, ROTERT ([id number]) [id number] Page 1 of 2 Visit Report for 06/11/2022 Problem List Details Patient Name: Date of Service: [name] MES E. 06/11/2022 8:00 A M Medical Record Number: [id number] Patient Account Number: [id number] Date of Birth/Sex: Treating RN: 15-Jan-1966 (56 y.o. 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 06/11/2022 Medical Record Number: [id number] Patient Account Number: [id number] Date of Birth/Sex: Treating RN: [birth year] (56 y.o. [name] Primary Care Provider: [name] Other Clinician: [name] Referring Provider: Treating Provider/Extender: [name] in Treatment: 10 Diagnosis Coding ICD-10 Codes Code Description N30.41 Irradiation cystitis with hematuria KHYRIN, TREVATHAN ([id number]) [id number] Page 2 of 2 R31.9 Hematuria, unspecified C61 Malignant neoplasm of prostate E10.3529 Type 1 diabetes mellitus with proliferative diabetic  retinopathy with traction retinal detachment involving the macula, unspecified eye Facility Procedures : CPT4 Code: [id number] Description: G0277-(Facility Use Only) HBOT full body chamber, 35mn , ICD-10 Diagnosis Description 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 Hematuria, unspecified C61 Malignant neoplasm of prostate Modifier: Quantity: 4 Physician Procedures : CPT4 Code Description Modifier [id number] 95188- WC PHYS HYPERBARIC OXYGEN THERAPY ICD-10 Diagnosis Description 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 Hematuria, unspecified C61 Malignant neoplasm of prostate Quantity: 1 Electronic Signature(s) Signed: 06/11/2022 2:17:12 PM By: GValeria BatmanEMT Signed: 06/11/2022 4:02:50 PM By: CFredirick MaudlinMD FACS Entered By: GValeria Batmanon 06/11/2022 14:17:12

## 2022-06-12 ENCOUNTER — Encounter (HOSPITAL_BASED_OUTPATIENT_CLINIC_OR_DEPARTMENT_OTHER): Payer: BC Managed Care – PPO | Admitting: General Surgery

## 2022-06-12 DIAGNOSIS — N3041 Irradiation cystitis with hematuria: Secondary | ICD-10-CM | POA: Diagnosis not present

## 2022-06-12 LAB — GLUCOSE, CAPILLARY
Glucose-Capillary: 246 mg/dL — ABNORMAL HIGH (ref 70–99)
Glucose-Capillary: 249 mg/dL — ABNORMAL HIGH (ref 70–99)

## 2022-06-12 NOTE — Progress Notes (Addendum)
VICKY, MCCANLESS ([id number]) [id number] Page 1 of 2 Visit Report for 06/12/2022 Arrival Information Details Patient Name: Date of Service: Dillon Dun MES E. 06/12/2022 7:00 A M Medical Record Number: [id number] Patient Account Number: [id number] Date of Birth/Sex: Treating RN: Sep 02, 1965 (56 y.o. [name], Meta.Reding Primary Care Nareg Breighner: [name] Other Clinician: [name] Referring Angelissa Supan: Treating Hau Sanor/Extender: [name] in Treatment: 10 Visit Information History Since Last Visit All ordered tests and consults were completed: Yes Patient Arrived: Ambulatory Added or deleted any medications: No Arrival Time: 06:59 Any new allergies or adverse reactions: No Accompanied By: None Had a fall or experienced change in No Transfer Assistance: None activities of daily living that may affect Patient Identification Verified: Yes risk of falls: Secondary Verification Process Completed: Yes Signs or symptoms of abuse/neglect since last visito No Patient Requires Transmission-Based Precautions: No Hospitalized since last visit: No Patient Has Alerts: No Implantable device outside of the clinic excluding No cellular tissue based products placed in the center since last visit: Pain Present Now: No Electronic Signature(s) Signed: 06/12/2022 11:46:33 AM By: [name] EMT Entered By: [name] on 06/12/2022 11:46:33 -------------------------------------------------------------------------------- Encounter Discharge Information Details Patient Name: Date of Service: Dillon Dun MES E. 06/12/2022 7:00 A M Medical Record Number: [id number] Patient Account Number: [id number] Date of Birth/Sex: Treating RN: 19-Nov-1965 (56 y.o. [name] Primary Care Nollie Shiflett: [name] Other Clinician: [name] Referring Siani Utke: Treating Tahji Garrett/Extender: [name] in Treatment:  10 Encounter Discharge Information Items Discharge Condition: Stable Ambulatory Status: Ambulatory Discharge Destination: Home Transportation: Private Auto Accompanied By: None Schedule Follow-up Appointment: Yes Clinical Summary of Care: Electronic Signature(s) Signed: 06/12/2022 12:15:43 PM By: [name] EMT Entered By: [name] on 06/12/2022 12:15:43 [name] ([id number]) [id number] Page 2 of 2 -------------------------------------------------------------------------------- Vitals Details Patient Name: Date of Service: Dillon Dun MES E. 06/12/2022 7:00 A M Medical Record Number: [id number] Patient Account Number: [id number] Date of Birth/Sex: Treating RN: 13-Dec-1965 (56 y.o. [name] Primary Care Horald Birky: [name] Other Clinician: [name] Referring Kadia Abaya: Treating Medora Roorda/Extender: [name] in Treatment: 10 Vital Signs Time Taken: 07:00 Temperature (F): 97.6 Height (in): 70 Pulse (bpm): 89 Weight (lbs): 210 Respiratory Rate (breaths/min): 18 Body Mass Index (BMI): 30.1 Blood Pressure (mmHg): 158/59 Capillary Blood Glucose (mg/dl): 246 Reference Range: 80 - 120 mg / dl Electronic Signature(s) Signed: 06/12/2022 11:50:20 AM By: [name] EMT Entered By: [name] on 06/12/2022 11:50:19

## 2022-06-12 NOTE — Progress Notes (Signed)
YARDEN, HILLIS ([id number]) [id number] Page 1 of 2 Visit Report for 06/12/2022 HBO Details Patient Name: Date of Service: [name]. 06/12/2022 7:00 A M Medical Record Number: [id number] Patient Account Number: [id number] Date of Birth/Sex: Treating RN: 09-12-1965 (56 y.o. [name] Primary Care Jashawn Floyd: [name] Other Clinician: [name] Referring Anuhea Gassner: Treating Jerami Tammen/Extender: [name] in Treatment: 10 HBO Treatment Course Details Treatment Course Number: 1 Ordering Arty Lantzy: [name] T Treatments Ordered: otal 60 HBO Treatment Start Date: 04/09/2022 HBO Indication: Late Effect of Radiation HBO Treatment Details Treatment Number: 41 Patient Type: Outpatient Chamber Type: Monoplace Chamber Serial #: [id number] Treatment Protocol: 2.0 ATA with 90 minutes oxygen, and no air breaks Treatment Details Compression Rate Down: 2.0 psi / minute De-Compression Rate Up: 2.0 psi / minute Air breaks and breathing Decompress Decompress Compress Tx Pressure Begins Reached periods Begins Ends (leave unused spaces blank) Chamber Pressure (ATA 1 2 ------2 1 ) Clock Time (24 hr) 07:19 09:27 - - - - - - 08:57 09:05 Treatment Length: 106 (minutes) Treatment Segments: 4 Vital Signs Capillary Blood Glucose Reference Range: 80 - 120 mg / dl HBO Diabetic Blood Glucose Intervention Range: <131 mg/dl or >249 mg/dl Time Vitals Blood Respiratory Capillary Blood Glucose Pulse Action Type: Pulse: Temperature: Taken: Pressure: Rate: Glucose (mg/dl): Meter #: Oximetry (%) Taken: Pre 07:00 158/59 89 18 97.6 246 Post 09:18 129/69 87 18 9735 249 Treatment Response Treatment Toleration: Well Treatment Completion Status: Treatment Completed without Adverse Event Physician HBO Attestation: I certify that I supervised this HBO treatment in accordance with Medicare guidelines. A trained emergency response  team is readily available per Yes hospital policies and procedures. Continue HBOT as ordered. Yes Electronic Signature(s) Signed: 06/12/2022 12:22:34 PM By: [name] MD FACS Previous Signature: 06/12/2022 12:14:53 PM Version By: [name] EMT Entered By: [name] on 06/12/2022 12:22:34 [name] ([id number]) [id number] Page 2 of 2 -------------------------------------------------------------------------------- HBO Safety Checklist Details Patient Name: Date of Service: [name]. 06/12/2022 7:00 A M Medical Record Number: [id number] Patient Account Number: [id number] Date of Birth/Sex: Treating RN: 12-05-1965 (56 y.o. [name], Meta.Reding Primary Care Dayron Odland: [name] Other Clinician: [name] Referring Ajanee Buren: Treating Brayleigh Rybacki/Extender: [name] in Treatment: 10 [name] Safety Checklist Consent Form Signed Patient voided / foley secured and emptied When did you last eato 0615 Last dose of injectable or oral agent 0630 Ostomy pouch emptied and vented if applicable NA All implantable devices assessed, documented and approved NA Intravenous access site secured and place NA Valuables secured Linens and cotton and cotton/polyester blend (less than 51% polyester) Personal oil-based products / skin lotions / body lotions removed Wigs or hairpieces removed NA Smoking or tobacco materials removed Books / newspapers / magazines / loose paper removed Cologne, aftershave, perfume and deodorant removed Jewelry removed (may wrap wedding band) Make-up removed NA Hair care products removed Battery operated devices (external) removed Heating patches and chemical warmers removed Titanium eyewear removed NA Nail polish cured greater than 10 hours NA Casting material cured greater than 10 hours NA Hearing aids removed NA Loose dentures or partials  removed NA Prosthetics have been removed Patient demonstrates correct use of air break device (if applicable) Patient concerns have been addressed Patient grounding bracelet on and cord attached to chamber Specifics for Inpatients (complete in addition to above) Medication sheet sent with patient NA Intravenous medications needed or due during therapy sent  with patient NA Drainage tubes ([name].g. nasogastric tube or chest tube secured and vented) NA Endotracheal or Tracheotomy tube secured NA Cuff deflated of air and inflated with saline NA Airway suctioned NA Notes The safety checklist was done before the treatment was started. Electronic Signature(s) Signed: 06/12/2022 12:13:26 PM By: [name] EMT Entered By: [name] on 06/12/2022 12:13:26

## 2022-06-12 NOTE — Progress Notes (Signed)
STERLING, MONDO ([id number]) [id number] Page 1 of 2 Visit Report for 06/12/2022 Problem List Details Patient Name: Date of Service: Dillon Dun MES E. 06/12/2022 7:00 A M Medical Record Number: [id number] Patient Account Number: [id number] Date of Birth/Sex: Treating RN: [birth year] (56 y.o. 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 06/12/2022 Medical Record Number: [id number] Patient Account Number: [id number] Date of Birth/Sex: Treating RN: 1965/10/31 (56 y.o. 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 Malignant neoplasm of prostate E10.3529 Type 1 diabetes mellitus with proliferative diabetic  retinopathy with traction retinal detachment involving the macula, unspecified eye Facility Procedures : CPT4 Code: [id number] Description: G0277-(Facility Use Only) HBOT full body chamber, 54mn , ICD-10 Diagnosis Description 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 Hematuria, unspecified C61 Malignant neoplasm of prostate Modifier: Quantity: 4 Physician Procedures : CPT4 Code Description Modifier [id number] 74944- WC PHYS HYPERBARIC OXYGEN THERAPY ICD-10 Diagnosis Description 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 Hematuria, unspecified 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

## 2022-06-16 ENCOUNTER — Encounter (HOSPITAL_BASED_OUTPATIENT_CLINIC_OR_DEPARTMENT_OTHER): Payer: BC Managed Care – PPO | Attending: General Surgery | Admitting: General Surgery

## 2022-06-16 DIAGNOSIS — E103529 Type 1 diabetes mellitus with proliferative diabetic retinopathy with traction retinal detachment involving the macula, unspecified eye: Secondary | ICD-10-CM | POA: Insufficient documentation

## 2022-06-16 DIAGNOSIS — N3041 Irradiation cystitis with hematuria: Secondary | ICD-10-CM | POA: Insufficient documentation

## 2022-06-16 DIAGNOSIS — E113599 Type 2 diabetes mellitus with proliferative diabetic retinopathy without macular edema, unspecified eye: Secondary | ICD-10-CM | POA: Diagnosis present

## 2022-06-16 DIAGNOSIS — C61 Malignant neoplasm of prostate: Secondary | ICD-10-CM | POA: Diagnosis not present

## 2022-06-16 LAB — GLUCOSE, CAPILLARY
Glucose-Capillary: 133 mg/dL — ABNORMAL HIGH (ref 70–99)
Glucose-Capillary: 95 mg/dL (ref 70–99)
Glucose-Capillary: 154 mg/dL — ABNORMAL HIGH (ref 70–99)

## 2022-06-16 NOTE — Progress Notes (Signed)
DAMANI, KELEMEN ([id number]) [id number] Page 1 of 2 Visit Report for 06/16/2022 HBO Details Patient Name: Date of Service: [name] MES E. 06/16/2022 8:00 A M Medical Record Number: [id number] Patient Account Number: [id number] Date of Birth/Sex: Treating RN: 20-Apr-1966 (57 y.o. [name] Primary Care Adlai Sinning: [name] Other Clinician: [name] Referring Mignon Bechler: Treating Loyalty Arentz/Extender: [name] in Treatment: 11 HBO Treatment Course Details Treatment Course Number: 1 Ordering Dayquan Buys: [name] T Treatments Ordered: otal 60 HBO Treatment Start Date: 04/09/2022 HBO Indication: Late Effect of Radiation HBO Treatment Details Treatment Number: 42 Patient Type: Outpatient Chamber Type: Monoplace Chamber Serial #: [id number] Treatment Protocol: 2.0 ATA with 90 minutes oxygen, and no air breaks Treatment Details Compression Rate Down: 2.0 psi / minute De-Compression Rate Up: 2.0 psi / minute Air breaks and breathing Decompress Decompress Compress Tx Pressure Begins Reached periods Begins Ends (leave unused spaces blank) Chamber Pressure (ATA 1 2 ------2 1 ) Clock Time (24 hr) 08:31 08:42 - - - - - - 10:12 10:18 Treatment Length: 107 (minutes) Treatment Segments: 4 Vital Signs Capillary Blood Glucose Reference Range: 80 - 120 mg / dl HBO Diabetic Blood Glucose Intervention Range: <131 mg/dl or >249 mg/dl Type: Time Vitals Blood Pulse: Respiratory Capillary Blood Glucose Pulse Action Temperature: Taken: Pressure: Rate: Glucose (mg/dl): Meter #: Oximetry (%) Taken: Pre 07:50 95 Patient given 8 oz glucerna Post 10:38 130/66 79 18 97.2 154 Pre 08:23 139/66 82 18 97.5 133 Treatment Response Treatment Toleration: Well Treatment Completion Status: Treatment Completed without Adverse Event Treatment Notes At 4235 blood sugar was 95. The patient was given 8 oz of Glucerna to drink. The patient  stated that he had 1/2 Bacon egg and cheese with an orange before coming in for treatment today. At 0823 rechecked blood sugar was 133. Dr. Celine Ahr informed. Dr. Celine Ahr stated that he could start treatment. Physician HBO Attestation: I certify that I supervised this HBO treatment in accordance with Medicare guidelines. A trained emergency response team is readily available per Yes hospital policies and procedures. Continue HBOT as ordered. Yes Electronic Signature(s) Signed: 06/16/2022 3:40:45 PM By: [name] MD FACS Previous Signature: 06/16/2022 2:02:39 PM Version By: [name] EMT Entered By: [name] on 06/16/2022 15:40:44 [name] ([id number]) [id number] Page 2 of 2 -------------------------------------------------------------------------------- HBO Safety Checklist Details Patient Name: Date of Service: [name] MES E. 06/16/2022 8:00 A M Medical Record Number: [id number] Patient Account Number: [id number] Date of Birth/Sex: Treating RN: 03/08/1966 (57 y.o. [name], Tammi Klippel Primary Care Parilee Hally: [name] Other Clinician: [name] Referring Maxamus Colao: Treating Marliss Buttacavoli/Extender: [name] in Treatment: 11 [name] Safety Checklist Consent Form Signed Patient voided / foley secured and emptied When did you last eato 0645 Last dose of injectable or oral agent 0700 Ostomy pouch emptied and vented if applicable NA All implantable devices assessed, documented and approved NA Intravenous access site secured and place NA Valuables secured Linens and cotton and cotton/polyester blend (less than 51% polyester) Personal oil-based products / skin lotions / body lotions removed Wigs or hairpieces removed NA Smoking or tobacco materials removed Books / newspapers / magazines / loose paper removed Cologne, aftershave, perfume and deodorant removed Jewelry removed (may wrap wedding  band) Make-up removed NA Hair care products removed Battery operated devices (external) removed Heating patches and chemical warmers removed Titanium eyewear removed NA Plastic frames Nail polish cured greater than 10 hours NA Casting material  cured greater than 10 hours NA Hearing aids removed NA Loose dentures or partials removed NA Prosthetics have been removed NA Patient demonstrates correct use of air break device (if applicable) Patient concerns have been addressed Patient grounding bracelet on and cord attached to chamber Specifics for Inpatients (complete in addition to above) Medication sheet sent with patient NA Intravenous medications needed or due during therapy sent with patient NA Drainage tubes (e.g. nasogastric tube or chest tube secured and vented) NA Endotracheal or Tracheotomy tube secured NA Cuff deflated of air and inflated with saline NA Airway suctioned NA Notes The safety checklist was done before the treatment was started. Electronic Signature(s) Signed: 06/16/2022 1:56:35 PM By: [name] EMT Entered By: [name] on 06/16/2022 13:56:35

## 2022-06-16 NOTE — Progress Notes (Signed)
KAY, RICCIUTI ([id number]) [id number] Page 1 of 2 Visit Report for 06/16/2022 Problem List Details Patient Name: Date of Service: Dillon Dun MES E. 06/16/2022 8:00 A M Medical Record Number: [id number] Patient Account Number: [id number] Date of Birth/Sex: Treating RN: Nov 11, 1965 (57 y.o. 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 06/16/2022 Medical Record Number: [id number] Patient Account Number: [id number] Date of Birth/Sex: Treating RN: October 04, 1965 (57 y.o. [name] Primary Care Provider: [name] Other Clinician: [name] Referring Provider: Treating Provider/Extender: [name] in Treatment: 11 Diagnosis Coding ICD-10 Codes Code Description N30.41 Irradiation cystitis with hematuria [name], [name] ([id number]) [id number] Page 2 of 2 R31.9 Hematuria, unspecified C61 Malignant neoplasm of prostate E10.3529 Type 1 diabetes mellitus with proliferative diabetic retinopathy with  traction retinal detachment involving the macula, unspecified eye Facility Procedures : CPT4 Code: [id number] Description: G0277-(Facility Use Only) HBOT full body chamber, 26mn , ICD-10 Diagnosis Description 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 Hematuria, unspecified C61 Malignant neoplasm of prostate Modifier: Quantity: 4 Physician Procedures : CPT4 Code Description Modifier [id number] 17915- WC PHYS HYPERBARIC OXYGEN THERAPY ICD-10 Diagnosis Description 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 Hematuria, unspecified C61 Malignant neoplasm of prostate Quantity: 1 Electronic Signature(s) Signed: 06/16/2022 2:02:59 PM By: GValeria BatmanEMT Signed: 06/16/2022 3:40:12 PM By: CFredirick MaudlinMD FACS Entered By: GValeria Batmanon 06/16/2022 14:02:59

## 2022-06-16 NOTE — Progress Notes (Signed)
LOVELL, NUTTALL ([id number]) [id number] Page 1 of 2 Visit Report for 06/16/2022 Arrival Information Details Patient Name: Date of Service: [name] MES E. 06/16/2022 8:00 A M Medical Record Number: [id number] Patient Account Number: [id number] Date of Birth/Sex: Treating RN: Dec 03, 1965 (57 y.o. [name], Meta.Reding Primary Care Isaih Bulger: [name] Other Clinician: [name] Referring Davi Kroon: Treating Chanz Cahall/Extender: [name] in Treatment: 11 Visit Information History Since Last Visit All ordered tests and consults were completed: Yes Patient Arrived: Ambulatory Added or deleted any medications: No Arrival Time: 07:42 Any new allergies or adverse reactions: No Accompanied By: None Had a fall or experienced change in No Transfer Assistance: None activities of daily living that may affect Patient Identification Verified: Yes risk of falls: Secondary Verification Process Completed: Yes Signs or symptoms of abuse/neglect since last visito No Patient Requires Transmission-Based Precautions: No Hospitalized since last visit: No Patient Has Alerts: No Implantable device outside of the clinic excluding No cellular tissue based products placed in the center since last visit: Pain Present Now: No Electronic Signature(s) Signed: 06/16/2022 1:54:42 PM By: [name] EMT Entered By: [name] on 06/16/2022 13:54:42 -------------------------------------------------------------------------------- Encounter Discharge Information Details Patient Name: Date of Service: [name] MES E. 06/16/2022 8:00 A M Medical Record Number: [id number] Patient Account Number: [id number] Date of Birth/Sex: Treating RN: August 30, 1965 (57 y.o. [name] Primary Care Semira Stoltzfus: [name] Other Clinician: [name] Referring Edynn Gillock: Treating Odis Wickey/Extender: [name] in Treatment:  11 Encounter Discharge Information Items Discharge Condition: Stable Ambulatory Status: Ambulatory Discharge Destination: Home Transportation: Private Auto Accompanied By: None Schedule Follow-up Appointment: Yes Clinical Summary of Care: Electronic Signature(s) Signed: 06/16/2022 2:03:32 PM By: [name] EMT Entered By: [name] on 06/16/2022 14:03:32 [name] ([id number]) [id number] Page 2 of 2 -------------------------------------------------------------------------------- Vitals Details Patient Name: Date of Service: [name] MES E. 06/16/2022 8:00 A M Medical Record Number: [id number] Patient Account Number: [id number] Date of Birth/Sex: Treating RN: [birth year] (57 y.o. [name] Primary Care Sharon Stapel: [name] Other Clinician: [name] Referring Cortny Bambach: Treating Burney Calzadilla/Extender: [name] in Treatment: 11 Vital Signs Time Taken: 07:50 Capillary Blood Glucose (mg/dl): 95 Height (in): 70 Reference Range: 80 - 120 mg / dl Weight (lbs): 210 Body Mass Index (BMI): 30.1 Electronic Signature(s) Signed: 06/16/2022 1:55:02 PM By: [name] EMT Entered By: [name] on 06/16/2022 13:55:02

## 2022-06-17 ENCOUNTER — Encounter (HOSPITAL_BASED_OUTPATIENT_CLINIC_OR_DEPARTMENT_OTHER): Payer: BC Managed Care – PPO | Admitting: General Surgery

## 2022-06-17 DIAGNOSIS — N3041 Irradiation cystitis with hematuria: Secondary | ICD-10-CM | POA: Diagnosis not present

## 2022-06-17 LAB — GLUCOSE, CAPILLARY
Glucose-Capillary: 150 mg/dL — ABNORMAL HIGH (ref 70–99)
Glucose-Capillary: 162 mg/dL — ABNORMAL HIGH (ref 70–99)

## 2022-06-17 NOTE — Progress Notes (Addendum)
[name], [name] ([id number]) [id number] Page 1 of 2 Visit Report for 06/17/2022 HBO Details Patient Name: Date of Service: [name]. 06/17/2022 8:00 A M Medical Record Number: [id number] Patient Account Number: [id number] Date of Birth/Sex: Treating RN: 13-Apr-1966 (57 y.o. [name] Primary Care Christyan Reger: [name] Other Clinician: Donavan Burnet Referring Akasha Melena: Treating Sean Macwilliams/Extender: [name] in Treatment: 11 HBO Treatment Course Details Treatment Course Number: 1 Ordering Anicia Leuthold: [name] T Treatments Ordered: otal 60 HBO Treatment Start Date: 04/09/2022 HBO Indication: Late Effect of Radiation HBO Treatment Details Treatment Number: 43 Patient Type: Outpatient Chamber Type: Monoplace Chamber Serial #: [id number] Treatment Protocol: 2.0 ATA with 90 minutes oxygen, and no air breaks Treatment Details Compression Rate Down: 1.5 psi / minute De-Compression Rate Up: 2.0 psi / minute Air breaks and breathing Decompress Decompress Compress Tx Pressure Begins Reached periods Begins Ends (leave unused spaces blank) Chamber Pressure (ATA 1 2 ------2 1 ) Clock Time (24 hr) 08:05 08:15 - - - - - - 09:46 10:02 Treatment Length: 117 (minutes) Treatment Segments: 4 Vital Signs Capillary Blood Glucose Reference Range: 80 - 120 mg / dl HBO Diabetic Blood Glucose Intervention Range: <131 mg/dl or >249 mg/dl Type: Time Vitals Blood Respiratory Capillary Blood Glucose Pulse Action Pulse: Temperature: Taken: Pressure: Rate: Glucose (mg/dl): Meter #: Oximetry (%) Taken: Pre 07:53 142/80 95 18 97.9 150 1 none per protocol Post 10:08 159/76 81 18 97.7 162 1 none per protocol Treatment Response Treatment Toleration: Well Treatment Completion Status: Treatment Completed without Adverse Event Treatment Notes Patient arrived, prepared for treatment. Initial CBG was 150 mg/dL. Patient stated that  he ate a breakfast burrito this morning. Safety check was performed and patient placed in the chamber. Chamber was compressed at a rate of 1 psi/minute until confirming at 3 psig that his ears were equalizing normally and travel rate was increased to 2 psi/min until reaching treatment depth of 2ATA. [name] tolerated treatment and decompression of chamber at a rate of 2 psi/min. Patient was stable upon discharge with post-treatment CBG of 162 mg/dL. Physician HBO Attestation: I certify that I supervised this HBO treatment in accordance with Medicare guidelines. A trained emergency response team is readily available per Yes hospital policies and procedures. Continue HBOT as ordered. Yes Electronic Signature(s) Signed: 06/17/2022 3:51:20 PM By: [name] MD FACS Previous Signature: 06/17/2022 2:04:12 PM Version By: Donavan Burnet CHT EMT BS , , Previous Signature: 06/17/2022 12:39:43 PM Version By: [name] MD FACS Previous Signature: 06/17/2022 12:01:19 PM Version By: Donavan Burnet CHT EMT BS , , [name], [name] ([id number]) [id number] Page 2 of 2 Entered By: [name] on 06/17/2022 15:51:20 -------------------------------------------------------------------------------- HBO Safety Checklist Details Patient Name: Date of Service: [name]. 06/17/2022 8:00 A M Medical Record Number: [id number] Patient Account Number: [id number] Date of Birth/Sex: Treating RN: Jan 16, 1966 (57 y.o. [name] Primary Care Danford Tat: [name] Other Clinician: Donavan Burnet Referring Fortino Haag: Treating [name] Summerfield/Extender: [name] in Treatment: 11 [name] Safety Checklist Consent Form Signed Patient voided / foley secured and emptied When did you last eato 0645 Last dose of injectable or oral agent 0700 Ostomy pouch emptied and vented if applicable NA All implantable devices assessed,  documented and approved NA Intravenous access site secured and place NA Valuables secured Linens and cotton and cotton/polyester blend (less than 51% polyester) Personal oil-based products / skin lotions / body lotions removed Wigs  or hairpieces removed NA Smoking or tobacco materials removed NA Books / newspapers / magazines / loose paper removed Cologne, aftershave, perfume and deodorant removed Jewelry removed (may wrap wedding band) Make-up removed NA Hair care products removed Battery operated devices (external) removed Heating patches and chemical warmers removed Titanium eyewear removed NA Plastic frames Nail polish cured greater than 10 hours NA Casting material cured greater than 10 hours NA Hearing aids removed NA Loose dentures or partials removed NA Prosthetics have been removed NA Patient demonstrates correct use of air break device (if applicable) Patient concerns have been addressed Patient grounding bracelet on and cord attached to chamber Specifics for Inpatients (complete in addition to above) Medication sheet sent with patient NA Intravenous medications needed or due during therapy sent with patient NA Drainage tubes (Moore[place]. nasogastric tube or chest tube secured and vented) NA Endotracheal or Tracheotomy tube secured NA Cuff deflated of air and inflated with saline NA Airway suctioned NA Notes Paper version used prior to treatment. Electronic Signature(s) Signed: 06/17/2022 8:58:09 AM By: Donavan Burnet CHT EMT BS , , Entered By: Donavan Burnet on 06/17/2022 08:58:09

## 2022-06-17 NOTE — Progress Notes (Signed)
MARCIA, LEPERA ([id number]) [id number] Page 1 of 1 Visit Report for 06/17/2022 SuperBill Details Patient Name: Date of Service: [name]. 06/17/2022 Medical Record Number: [id number] Patient Account Number: [id number] Date of Birth/Sex: Treating RN: September 16, 1965 (57 y.o. 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, unspecified eye Facility Procedures CPT4 Code Description Modifier Quantity [id number] G0277-(Facility Use Only) HBOT full body chamber, 79mn , 4 ICD-10 Diagnosis Description 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 Hematuria, unspecified C61 Malignant neoplasm of prostate 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, unspecified eye Physician Procedures Quantity CPT4 Code Description Modifier [id number] 02585- WC PHYS HYPERBARIC OXYGEN THERAPY 1 ICD-10 Diagnosis Description 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 Hematuria, unspecified C61 Malignant neoplasm of prostate 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

## 2022-06-17 NOTE — Progress Notes (Addendum)
JAYTEN, GABBARD ([id number]) [id number] Page 1 of 2 Visit Report for 06/17/2022 Arrival Information Details Patient Name: Date of Service: Dillon Dun MES E. 06/17/2022 8:00 A M Medical Record Number: [id number] Patient Account Number: [id number] Date of Birth/Sex: Treating RN: 1965/09/26 (57 y.o. [name] Primary Care Anis Cinelli: [name] Other Clinician: Donavan Burnet Referring Cola Gane: Treating Lizmary Nader/Extender: [name] in Treatment: 11 Visit Information History Since Last Visit All ordered tests and consults were completed: Yes Patient Arrived: Ambulatory Added or deleted any medications: No Arrival Time: 07:40 Any new allergies or adverse reactions: No Accompanied By: self Had a fall or experienced change in No Transfer Assistance: None activities of daily living that may affect Patient Identification Verified: Yes risk of falls: Secondary Verification Process Completed: Yes Signs or symptoms of abuse/neglect since last visito No Patient Requires Transmission-Based Precautions: No Hospitalized since last visit: No Patient Has Alerts: No Implantable device outside of the clinic excluding No cellular tissue based products placed in the center since last visit: Pain Present Now: No Electronic Signature(s) Signed: 06/17/2022 8:56:44 AM By: Donavan Burnet CHT EMT BS , , Entered By: Donavan Burnet on 06/17/2022 08:56:44 -------------------------------------------------------------------------------- Encounter Discharge Information Details Patient Name: Date of Service: Dillon Dun MES E. 06/17/2022 8:00 A M Medical Record Number: [id number] Patient Account Number: [id number] Date of Birth/Sex: Treating RN: 02-24-1966 (57 y.o. [name] Primary Care Ryonna Cimini: [name] Other Clinician: Donavan Burnet Referring Gem Conkle: Treating Lexee Brashears/Extender: [name] in Treatment: 11 Encounter Discharge Information Items Discharge Condition: Stable Ambulatory Status: Ambulatory Discharge Destination: Home Transportation: Private Auto Accompanied By: self Schedule Follow-up Appointment: No Clinical Summary of Care: Electronic Signature(s) Signed: 06/17/2022 12:02:02 PM By: Donavan Burnet CHT EMT BS , , Entered By: Donavan Burnet on 06/17/2022 12:02:02 [name] ([id number]) [id number] Page 2 of 2 -------------------------------------------------------------------------------- Vitals Details Patient Name: Date of Service: Dillon Dun MES E. 06/17/2022 8:00 A M Medical Record Number: [id number] Patient Account Number: [id number] Date of Birth/Sex: Treating RN: 1966/05/05 (57 y.o. [name] Primary Care Briannon Boggio: [name] Other Clinician: Donavan Burnet Referring Matricia Begnaud: Treating Labrisha Wuellner/Extender: [name] in Treatment: 11 Vital Signs Time Taken: 07:53 Temperature (F): 97.9 Height (in): 70 Pulse (bpm): 95 Weight (lbs): 210 Respiratory Rate (breaths/min): 18 Body Mass Index (BMI): 30.1 Blood Pressure (mmHg): 142/80 Capillary Blood Glucose (mg/dl): 150 Reference Range: 80 - 120 mg / dl Electronic Signature(s) Signed: 06/17/2022 8:57:06 AM By: Donavan Burnet CHT EMT BS , , Entered By: Donavan Burnet on 06/17/2022 08:57:06

## 2022-06-18 ENCOUNTER — Encounter (HOSPITAL_BASED_OUTPATIENT_CLINIC_OR_DEPARTMENT_OTHER): Payer: BC Managed Care – PPO | Admitting: General Surgery

## 2022-06-19 ENCOUNTER — Other Ambulatory Visit: Payer: Self-pay | Admitting: Urology

## 2022-06-19 ENCOUNTER — Encounter (HOSPITAL_BASED_OUTPATIENT_CLINIC_OR_DEPARTMENT_OTHER): Payer: BC Managed Care – PPO | Admitting: General Surgery

## 2022-06-19 DIAGNOSIS — N3041 Irradiation cystitis with hematuria: Secondary | ICD-10-CM | POA: Diagnosis not present

## 2022-06-19 LAB — GLUCOSE, CAPILLARY
Glucose-Capillary: 186 mg/dL — ABNORMAL HIGH (ref 70–99)
Glucose-Capillary: 145 mg/dL — ABNORMAL HIGH (ref 70–99)

## 2022-06-19 NOTE — Progress Notes (Signed)
LEM, PEARY ([id number]) [id number] Page 1 of 2 Visit Report for 06/19/2022 HBO Details Patient Name: Date of Service: [name] MES E. 06/19/2022 8:00 A M Medical Record Number: [id number] Patient Account Number: [id number] Date of Birth/Sex: Treating RN: 03/26/1966 (57 y.o. [name] Primary Care Sachin Ferencz: [name] Other Clinician: [name] Referring Colen Eltzroth: Treating Angeleen Horney/Extender: [name] in Treatment: 11 HBO Treatment Course Details Treatment Course Number: 1 Ordering Roneisha Stern: [name] T Treatments Ordered: otal 60 HBO Treatment Start Date: 04/09/2022 HBO Indication: Late Effect of Radiation HBO Treatment Details Treatment Number: 44 Patient Type: Outpatient Chamber Type: Monoplace Chamber Serial #: [id number] Treatment Protocol: 2.0 ATA with 90 minutes oxygen, and no air breaks Treatment Details Compression Rate Down: 2.0 psi / minute De-Compression Rate Up: 2.0 psi / minute Air breaks and breathing Decompress Decompress Compress Tx Pressure Begins Reached periods Begins Ends (leave unused spaces blank) Chamber Pressure (ATA 1 2 ------2 1 ) Clock Time (24 hr) 08:27 08:35 - - - - - - 10:05 10:13 Treatment Length: 106 (minutes) Treatment Segments: 4 Vital Signs Capillary Blood Glucose Reference Range: 80 - 120 mg / dl HBO Diabetic Blood Glucose Intervention Range: <131 mg/dl or >249 mg/dl Time Vitals Blood Respiratory Capillary Blood Glucose Pulse Action Type: Pulse: Temperature: Taken: Pressure: Rate: Glucose (mg/dl): Meter #: Oximetry (%) Taken: Pre 07:55 139/62 93 16 98.2 186 Post 10:23 156/85 85 18 97.7 145 Treatment Response Treatment Toleration: Well Treatment Completion Status: Treatment Completed without Adverse Event Physician HBO Attestation: I certify that I supervised this HBO treatment in accordance with Medicare guidelines. A trained emergency response  team is readily available per Yes hospital policies and procedures. Continue HBOT as ordered. Yes Electronic Signature(s) Signed: 06/19/2022 3:19:46 PM By: [name] MD FACS Previous Signature: 06/19/2022 2:10:56 PM Version By: [name] EMT Entered By: [name] on 06/19/2022 15:19:45 [name] ([id number]) [id number] Page 2 of 2 -------------------------------------------------------------------------------- HBO Safety Checklist Details Patient Name: Date of Service: [name] MES E. 06/19/2022 8:00 A M Medical Record Number: [id number] Patient Account Number: [id number] Date of Birth/Sex: Treating RN: 20-Jun-1965 (57 y.o. [name] Primary Care Meigan Pates: [name] Other Clinician: [name] Referring Kaleb Sek: Treating Tashianna Broome/Extender: [name] in Treatment: 11 [name] Safety Checklist Consent Form Signed Patient voided / foley secured and emptied When did you last eato 0700 Last dose of injectable or oral agent 0705 Ostomy pouch emptied and vented if applicable NA All implantable devices assessed, documented and approved foley cath to bedside drain bag Intravenous access site secured and place NA Valuables secured Linens and cotton and cotton/polyester blend (less than 51% polyester) Personal oil-based products / skin lotions / body lotions removed Wigs or hairpieces removed NA Smoking or tobacco materials removed Books / newspapers / magazines / loose paper removed Cologne, aftershave, perfume and deodorant removed Jewelry removed (may wrap wedding band) Make-up removed NA Hair care products removed Battery operated devices (external) removed Heating patches and chemical warmers removed Titanium eyewear removed NA Plastic frames Nail polish cured greater than 10 hours NA Casting material cured greater than 10 hours NA Hearing aids removed NA Loose  dentures or partials removed NA Prosthetics have been removed NA Patient demonstrates correct use of air break device (if applicable) Patient concerns have been addressed Patient grounding bracelet on and cord attached to chamber Specifics for Inpatients (complete in addition to above) Medication sheet sent with patient NA  Intravenous medications needed or due during therapy sent with patient NA Drainage tubes (e.g. nasogastric tube or chest tube secured and vented) NA Endotracheal or Tracheotomy tube secured NA Cuff deflated of air and inflated with saline NA Airway suctioned NA Notes The safety checklist was done before the treatment was started. The patient stated that he had to go to [facility] yesterday and had a foley cath placed. The patient had dark red urine with blood clots noted in the drain bag. Stated that had had a 5 out of 10 pain scale today. Electronic Signature(s) Signed: 06/19/2022 2:09:21 PM By: [name] EMT Entered By: [name] on 06/19/2022 14:09:20

## 2022-06-19 NOTE — Progress Notes (Signed)
PENN, GRISSETT ([id number]) [id number] Page 1 of 2 Visit Report for 06/19/2022 Arrival Information Details Patient Name: Date of Service: [name] E. 06/19/2022 8:00 A M Medical Record Number: [id number] Patient Account Number: [id number] Date of Birth/Sex: Treating RN: 08/09/1965 (57 y.o. [name] Primary Care Collier Monica: [name] Other Clinician: [name] Referring Haizley Cannella: Treating Cheria Sadiq/Extender: [name] in Treatment: 11 Visit Information History Since Last Visit All ordered tests and consults were completed: Yes Patient Arrived: Ambulatory Added or deleted any medications: No Arrival Time: 07:51 Any new allergies or adverse reactions: No Accompanied By: None Had a fall or experienced change in No Transfer Assistance: None activities of daily living that may affect Patient Identification Verified: Yes risk of falls: Secondary Verification Process Completed: Yes Signs or symptoms of abuse/neglect since last visito No Patient Requires Transmission-Based Precautions: No Hospitalized since last visit: No Patient Has Alerts: No Implantable device outside of the clinic excluding Yes cellular tissue based products placed in the center since last visit: Pain Present Now: Yes Electronic Signature(s) Signed: 06/19/2022 2:02:58 PM By: [name] EMT Entered By: [name] on 06/19/2022 14:02:57 -------------------------------------------------------------------------------- Encounter Discharge Information Details Patient Name: Date of Service: [name] E. 06/19/2022 8:00 A M Medical Record Number: [id number] Patient Account Number: [id number] Date of Birth/Sex: Treating RN: [birth year] (57 y.o. [name] Primary Care Len Kluver: [name] Other Clinician: [name] Referring Karyme Mcconathy: Treating Prospero Mahnke/Extender: [name] in Treatment:  11 Encounter Discharge Information Items Discharge Condition: Stable Ambulatory Status: Ambulatory Discharge Destination: Home Transportation: Private Auto Accompanied By: None Schedule Follow-up Appointment: Yes Clinical Summary of Care: Electronic Signature(s) Signed: 06/19/2022 2:12:06 PM By: [name] EMT Entered By: [name] on 06/19/2022 14:12:05 [name] ([id number]) [id number] Page 2 of 2 -------------------------------------------------------------------------------- Vitals Details Patient Name: Date of Service: [name] E. 06/19/2022 8:00 A M Medical Record Number: [id number] Patient Account Number: [id number] Date of Birth/Sex: Treating RN: 1965-07-09 (57 y.o. [name] Primary Care Osiel Stick: [name] Other Clinician: [name] Referring Talullah Abate: Treating Jaydyn Menon/Extender: [name] in Treatment: 11 Vital Signs Time Taken: 07:55 Temperature (F): 98.2 Height (in): 70 Pulse (bpm): 93 Weight (lbs): 210 Respiratory Rate (breaths/min): 16 Body Mass Index (BMI): 30.1 Blood Pressure (mmHg): 139/62 Capillary Blood Glucose (mg/dl): 186 Reference Range: 80 - 120 mg / dl Electronic Signature(s) Signed: 06/19/2022 2:04:37 PM By: [name] EMT Entered By: [name] on 06/19/2022 14:04:37

## 2022-06-19 NOTE — H&P (Signed)
Office Visit Report     06/19/2022   --------------------------------------------------------------------------------   [name]  MRN: [id number]  DOB: 09-21-1965, 57 year old Male  SSN:    PRIMARY CARE:  Sandi Mariscal, MD  PRIMARY CARE FAX:  [phone]  REFERRING:  Luvenia Redden  PROVIDER:  Raynelle Bring, M.D.  LOCATION:  [facility] [phone]     --------------------------------------------------------------------------------   CC/HPI: Radiation cystitis/clot urinary retention   Clair Gulling returns today after having presented to the office with clot retention again yesterday. He required catheter placement and irrigation. Some clot was removed and his urine quickly turned clear. However, he has had recurrent bleeding overnight. At this time, he has a 33 [place] hematuria catheter in place and it is draining well but with red-tinged urine.     ALLERGIES: No Allergies    MEDICATIONS: Phenazopyridine Hcl 200 mg tablet 1 tablet PO Q 8 H PRN for burning as needed  Novolog Flexpen  Proair Hfa 90 mcg hfa aerosol with adapter Inhalation  Ramipril 10 mg capsule Oral  Tresiba Flextouch U-100 100 unit/ml (3 ml) insulin pen Subcutaneous  Xtandi 40 mg tablet 4 tablet PO Daily     GU PSH: Cystoscopy - 10/09/2020 Laparoscopy; Lymphadenectomy - 2017 Locm 300-'399Mg'$ /Ml Iodine,1Ml - 01/31/2021 Robotic Radical Prostatectomy - 2017       PSH Notes: Laparoscopy With Bilateral Total Pelvic Lymphadenectomy, Prostatect Retropubic Radical W/ Nerve Sparing Laparoscopic, Inguinal Hernia Repair   NON-GU PSH: No Non-GU PSH    GU PMH: Gross hematuria - 06/18/2022, - 06/09/2022, - 05/06/2022, - 05/05/2022, - 05/04/2022, - 04/29/2022, - 01/31/2021, - 10/09/2020 Prostate Cancer - 06/18/2022, - 06/09/2022, - 05/05/2022, - 04/29/2022, - 01/20/2022, - 10/22/2021, - 10/15/2021, - 06/20/2021, - 10/09/2020, - 07/17/2020, - 04/12/2020, Prostate cancer, - 2017 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 He developed a biochemical recurrence in July 2019 and was treated with salvage radiation therapy from October through December 2019 under the care of Dr. Tammi Klippel. He developed biochemical recurrence again in October 2021.   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 Abner Greenspan) - radiation cystitis  Nov 2023 - Jan 2024: Hyperbaric oxygen     NON-GU PMH: Diabetes Type 2 Hypertension    FAMILY HISTORY:  1 son - Runs in Family father deceased from heart attack - Runs in Family mother decea - Runs in Family   SOCIAL HISTORY: Marital Status: Married Preferred Language: English; Ethnicity: Not Hispanic Or Latino; Race: White Current Smoking Status: Patient  has never smoked.  Does drink.  Drinks 1 caffeinated drink per day.     Notes: Married, Occupation, Never a smoker, Number of children, Alcohol use   REVIEW OF SYSTEMS:    GU Review Male:   Patient denies frequent urination, hard to postpone urination, burning/ pain with urination, get up at night to urinate, leakage of urine, stream starts and stops, trouble starting your streams, and have to strain to urinate .  Gastrointestinal (Lower):   Patient denies diarrhea and constipation.  Gastrointestinal (Upper):   Patient denies nausea and vomiting.  Constitutional:   Patient denies fever, night sweats, weight loss, and fatigue.  Skin:   Patient denies skin rash/ lesion and itching.  Eyes:   Patient denies blurred vision and double vision.  Ears/ Nose/ Throat:   Patient denies sore throat and sinus problems.  Hematologic/Lymphatic:   Patient denies swollen glands and easy bruising.  Cardiovascular:   Patient denies leg swelling and chest pains.  Respiratory:   Patient denies cough and shortness of breath.  Endocrine:   Patient denies excessive thirst.  Musculoskeletal:   Patient denies back pain and joint pain.  Neurological:   Patient denies headaches and dizziness.  Psychologic:   Patient denies depression and anxiety.   VITAL SIGNS:      06/19/2022 01:00 PM  BP 139/70 mmHg  Pulse 95 /min  Temperature 97.3 F / 36.2 C   GU PHYSICAL EXAMINATION:    Urethral Meatus: Normal size. No lesion, no wart, no discharge, no polyp. Normal location.   MULTI-SYSTEM PHYSICAL EXAMINATION:    Constitutional: Well-nourished. No physical deformities. Normally developed. Good grooming.  Respiratory: No labored breathing, no use of accessory muscles.   Cardiovascular: Normal temperature, normal extremity pulses, no swelling, no varicosities.     Complexity of Data:  Records Review:   Previous Patient Records   04/22/22 01/13/22 10/07/21 06/10/21 01/06/21 09/30/20 07/08/20 04/05/20  PSA  Total PSA  0.016 ng/mL 0.065 ng/mL 9.74 ng/mL 5.57 ng/mL 1.33 ng/mL 0.65 ng/mL 0.38 ng/mL 0.28 ng/mL    04/22/22 01/13/22 10/07/21  Hormones  Testosterone, Total 12.6 ng/dL 28.5 ng/dL 483.4 ng/dL    PROCEDURES:        Notes:   I hand irrigated his catheter removed multiple medium size clots. His urine remained clear to light pink following this.   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          Document Letter(s):  Created for Patient: Clinical Summary         Notes:   1. Radiation cystitis: At this point, I recommended that we proceed with cystoscopy and fulguration of any bleeding sites to see if we can assist him to not have ongoing intermittent bleeding issues that have been problematic and resulted in multiple emergency room visits and clinic visits. He will continue his hyperbaric oxygen treatments in the meantime.   2. Prostate cancer: He does have an appointment in  February with his next PSA.        Next Appointment:      Next Appointment: 06/22/2022 04:00 PM    Appointment Type: Surgery     Location: [facility] [phone]    Provider: Raynelle Bring, M.D.    Reason for Visit: OP WL Bernette Redbird OF BLD      * Signed by Raynelle Bring, M.D. on 06/19/22 at 4:58 PM (EST)*

## 2022-06-19 NOTE — Progress Notes (Signed)
GAUDENCIO, CHESNUT ([id number]) [id number] Page 1 of 2 Visit Report for 06/19/2022 Problem List Details Patient Name: Date of Service: [name] E. 06/19/2022 8:00 A M Medical Record Number: [id number] Patient Account Number: [id number] Date of Birth/Sex: Treating RN: August 31, 1965 (57 y.o. 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 06/19/2022 Medical Record Number: [id number] Patient Account Number: [id number] Date of Birth/Sex: Treating RN: [birth year] (57 y.o. [name] Primary Care Provider: [name] Other Clinician: [name] Referring Provider: Treating Provider/Extender: [name] in Treatment: 11 Diagnosis Coding ICD-10 Codes Code Description N30.41 Irradiation cystitis with hematuria [name], [name] ([id number]) [id number] Page 2 of 2 R31.9 Hematuria, unspecified C61 Malignant neoplasm of prostate E10.3529 Type 1 diabetes mellitus with proliferative diabetic  retinopathy with traction retinal detachment involving the macula, unspecified eye Facility Procedures : CPT4 Code: [id number] Description: G0277-(Facility Use Only) HBOT full body chamber, 42mn , ICD-10 Diagnosis Description 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 Hematuria, unspecified C61 Malignant neoplasm of prostate Modifier: Quantity: 4 Physician Procedures : CPT4 Code Description Modifier [id number] PHYS HYPERBARIC OXYGEN THERAPY ICD-10 Diagnosis Description 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 Hematuria, unspecified C61 Malignant neoplasm of prostate Quantity: 1 Electronic Signature(s) Signed: 06/19/2022 2:11:12 PM By: GValeria BatmanEMT Signed: 06/19/2022 3:18:58 PM By: CFredirick MaudlinMD FACS Entered By: GValeria Batmanon 06/19/2022 14:11:12

## 2022-06-22 ENCOUNTER — Encounter (HOSPITAL_COMMUNITY)
Admission: RE | Disposition: A | Discharge status: Home / Self Care | Payer: Self-pay | Source: Home / Self Care | Attending: Urology

## 2022-06-22 ENCOUNTER — Ambulatory Visit (HOSPITAL_COMMUNITY): Payer: BC Managed Care – PPO | Admitting: Anesthesiology

## 2022-06-22 ENCOUNTER — Encounter (HOSPITAL_BASED_OUTPATIENT_CLINIC_OR_DEPARTMENT_OTHER): Payer: BC Managed Care – PPO | Admitting: General Surgery

## 2022-06-22 ENCOUNTER — Encounter (HOSPITAL_COMMUNITY): Payer: Self-pay | Admitting: Urology

## 2022-06-22 ENCOUNTER — Ambulatory Visit (HOSPITAL_COMMUNITY)
Admission: RE | Admit: 2022-06-22 | Discharge: 2022-06-22 | Disposition: A | Discharge status: Home / Self Care | Payer: BC Managed Care – PPO | Attending: Urology | Admitting: Urology

## 2022-06-22 DIAGNOSIS — Y842 Radiological procedure and radiotherapy as the cause of abnormal reaction of the patient, or of later complication, without mention of misadventure at the time of the procedure: Secondary | ICD-10-CM | POA: Insufficient documentation

## 2022-06-22 DIAGNOSIS — J45909 Unspecified asthma, uncomplicated: Secondary | ICD-10-CM | POA: Diagnosis not present

## 2022-06-22 DIAGNOSIS — Z87891 Personal history of nicotine dependence: Secondary | ICD-10-CM | POA: Diagnosis not present

## 2022-06-22 DIAGNOSIS — C61 Malignant neoplasm of prostate: Secondary | ICD-10-CM | POA: Insufficient documentation

## 2022-06-22 DIAGNOSIS — E119 Type 2 diabetes mellitus without complications: Secondary | ICD-10-CM | POA: Diagnosis not present

## 2022-06-22 DIAGNOSIS — Z794 Long term (current) use of insulin: Secondary | ICD-10-CM | POA: Diagnosis not present

## 2022-06-22 DIAGNOSIS — N3041 Irradiation cystitis with hematuria: Secondary | ICD-10-CM | POA: Diagnosis not present

## 2022-06-22 DIAGNOSIS — I10 Essential (primary) hypertension: Secondary | ICD-10-CM | POA: Diagnosis not present

## 2022-06-22 HISTORY — PX: CYSTOSCOPY WITH FULGERATION: SHX6638

## 2022-06-22 LAB — GLUCOSE, CAPILLARY
Glucose-Capillary: 303 mg/dL — ABNORMAL HIGH (ref 70–99)
Glucose-Capillary: 321 mg/dL — ABNORMAL HIGH (ref 70–99)
Glucose-Capillary: 190 mg/dL — ABNORMAL HIGH (ref 70–99)
Glucose-Capillary: 189 mg/dL — ABNORMAL HIGH (ref 70–99)

## 2022-06-22 LAB — CBC
HCT: 22.4 % — ABNORMAL LOW (ref 39.0–52.0)
Hemoglobin: 7.5 g/dL — ABNORMAL LOW (ref 13.0–17.0)
MCH: 31.9 pg (ref 26.0–34.0)
MCHC: 33.5 g/dL (ref 30.0–36.0)
MCV: 95.3 fL (ref 80.0–100.0)
Platelets: 483 10*3/uL — ABNORMAL HIGH (ref 150–400)
RBC: 2.35 MIL/uL — ABNORMAL LOW (ref 4.22–5.81)
RDW: 12.8 % (ref 11.5–15.5)
WBC: 6.3 10*3/uL (ref 4.0–10.5)
nRBC: 0 % (ref 0.0–0.2)

## 2022-06-22 LAB — TYPE AND SCREEN
ABO/RH(D): O POS
Antibody Screen: NEGATIVE

## 2022-06-22 SURGERY — CYSTOSCOPY, WITH BLADDER FULGURATION
Anesthesia: General

## 2022-06-22 MED ORDER — FENTANYL CITRATE PF 50 MCG/ML IJ SOSY
PREFILLED_SYRINGE | INTRAMUSCULAR | Status: AC
  Administered 2022-06-22: 50 ug via INTRAVENOUS
  Filled 2022-06-22: qty 1

## 2022-06-22 MED ORDER — CHLORHEXIDINE GLUCONATE 0.12 % MT SOLN
15.0000 mL | Freq: Once | OROMUCOSAL | Status: AC
  Administered 2022-06-22: 15 mL via OROMUCOSAL

## 2022-06-22 MED ORDER — LIDOCAINE HCL URETHRAL/MUCOSAL 2 % EX GEL
CUTANEOUS | Status: AC
  Filled 2022-06-22: qty 5

## 2022-06-22 MED ORDER — FENTANYL CITRATE (PF) 100 MCG/2ML IJ SOLN
INTRAMUSCULAR | Status: DC | PRN
  Administered 2022-06-22 (×4): 50 ug via INTRAVENOUS

## 2022-06-22 MED ORDER — LIDOCAINE HCL (PF) 2 % IJ SOLN
INTRAMUSCULAR | Status: AC
  Filled 2022-06-22: qty 5

## 2022-06-22 MED ORDER — ONDANSETRON HCL 4 MG/2ML IJ SOLN
INTRAMUSCULAR | Status: DC | PRN
  Administered 2022-06-22: 4 mg via INTRAVENOUS

## 2022-06-22 MED ORDER — FENTANYL CITRATE (PF) 100 MCG/2ML IJ SOLN
INTRAMUSCULAR | Status: AC
  Filled 2022-06-22: qty 2

## 2022-06-22 MED ORDER — INSULIN ASPART 100 UNIT/ML IJ SOLN: 5.0000 [IU] | Freq: Once | INTRAMUSCULAR | Status: DC

## 2022-06-22 MED ORDER — SODIUM CHLORIDE 0.9 % IR SOLN
Status: DC | PRN
  Administered 2022-06-22: 6000 mL

## 2022-06-22 MED ORDER — HYOSCYAMINE SULFATE 0.125 MG SL SUBL
SUBLINGUAL_TABLET | SUBLINGUAL | Status: AC
  Filled 2022-06-22: qty 1

## 2022-06-22 MED ORDER — PROPOFOL 10 MG/ML IV BOLUS
INTRAVENOUS | Status: DC | PRN
  Administered 2022-06-22: 200 mg via INTRAVENOUS
  Administered 2022-06-22: 50 mg via INTRAVENOUS

## 2022-06-22 MED ORDER — ACETAMINOPHEN 10 MG/ML IV SOLN: 1000.0000 mg | Freq: Once | INTRAVENOUS | Status: DC | PRN

## 2022-06-22 MED ORDER — PROPOFOL 10 MG/ML IV BOLUS
INTRAVENOUS | Status: AC
  Filled 2022-06-22: qty 20

## 2022-06-22 MED ORDER — HYDRALAZINE HCL 20 MG/ML IJ SOLN: 5.0000 mg | Freq: Once | INTRAMUSCULAR | Status: AC | PRN

## 2022-06-22 MED ORDER — FENTANYL CITRATE PF 50 MCG/ML IJ SOSY: 25.0000 ug | PREFILLED_SYRINGE | INTRAMUSCULAR | Status: DC | PRN

## 2022-06-22 MED ORDER — DEXAMETHASONE SODIUM PHOSPHATE 10 MG/ML IJ SOLN
INTRAMUSCULAR | Status: DC | PRN
  Administered 2022-06-22: 4 mg via INTRAVENOUS

## 2022-06-22 MED ORDER — ACETAMINOPHEN 10 MG/ML IV SOLN
1000.0000 mg | Freq: Once | INTRAVENOUS | Status: DC | PRN
  Administered 2022-06-22: 1000 mg via INTRAVENOUS

## 2022-06-22 MED ORDER — OXYCODONE HCL 5 MG PO TABS
5.0000 mg | ORAL_TABLET | Freq: Once | ORAL | Status: AC | PRN
  Administered 2022-06-22: 5 mg via ORAL

## 2022-06-22 MED ORDER — MIDAZOLAM HCL 2 MG/2ML IJ SOLN
INTRAMUSCULAR | Status: AC
  Filled 2022-06-22: qty 2

## 2022-06-22 MED ORDER — OXYCODONE HCL 5 MG PO TABS: 5.0000 mg | ORAL_TABLET | Freq: Three times a day (TID) | ORAL | 0 refills | Status: DC | PRN

## 2022-06-22 MED ORDER — HYOSCYAMINE SULFATE 0.125 MG SL SUBL
0.1250 mg | SUBLINGUAL_TABLET | Freq: Once | SUBLINGUAL | Status: AC | PRN
  Administered 2022-06-22: 0.125 mg via SUBLINGUAL

## 2022-06-22 MED ORDER — 0.9 % SODIUM CHLORIDE (POUR BTL) OPTIME
TOPICAL | Status: DC | PRN
  Administered 2022-06-22: 1000 mL

## 2022-06-22 MED ORDER — FENTANYL CITRATE PF 50 MCG/ML IJ SOSY
PREFILLED_SYRINGE | INTRAMUSCULAR | Status: AC
  Administered 2022-06-22: 50 ug via INTRAVENOUS
  Filled 2022-06-22: qty 2

## 2022-06-22 MED ORDER — OXYCODONE HCL 5 MG/5ML PO SOLN: 5.0000 mg | Freq: Once | ORAL | Status: AC | PRN

## 2022-06-22 MED ORDER — ACETAMINOPHEN 325 MG PO TABS: 325.0000 mg | ORAL_TABLET | ORAL | Status: DC | PRN

## 2022-06-22 MED ORDER — ONDANSETRON HCL 4 MG/2ML IJ SOLN
INTRAMUSCULAR | Status: AC
  Filled 2022-06-22: qty 2

## 2022-06-22 MED ORDER — LABETALOL HCL 5 MG/ML IV SOLN
5.0000 mg | INTRAVENOUS | Status: AC | PRN
  Administered 2022-06-22 (×2): 5 mg via INTRAVENOUS

## 2022-06-22 MED ORDER — HYDRALAZINE HCL 20 MG/ML IJ SOLN
INTRAMUSCULAR | Status: AC
  Administered 2022-06-22: 5 mg via INTRAVENOUS
  Filled 2022-06-22: qty 1

## 2022-06-22 MED ORDER — MIDAZOLAM HCL 2 MG/2ML IJ SOLN
INTRAMUSCULAR | Status: DC | PRN
  Administered 2022-06-22: 2 mg via INTRAVENOUS

## 2022-06-22 MED ORDER — PROMETHAZINE HCL 25 MG/ML IJ SOLN: 6.2500 mg | INTRAMUSCULAR | Status: DC | PRN

## 2022-06-22 MED ORDER — DEXAMETHASONE SODIUM PHOSPHATE 10 MG/ML IJ SOLN
INTRAMUSCULAR | Status: AC
  Filled 2022-06-22: qty 1

## 2022-06-22 MED ORDER — LABETALOL HCL 5 MG/ML IV SOLN
INTRAVENOUS | Status: AC
  Administered 2022-06-22: 5 mg via INTRAVENOUS
  Filled 2022-06-22: qty 4

## 2022-06-22 MED ORDER — FENTANYL CITRATE PF 50 MCG/ML IJ SOSY
25.0000 ug | PREFILLED_SYRINGE | INTRAMUSCULAR | Status: AC | PRN
  Administered 2022-06-22 (×2): 50 ug via INTRAVENOUS

## 2022-06-22 MED ORDER — CEFAZOLIN SODIUM-DEXTROSE 2-4 GM/100ML-% IV SOLN
2.0000 g | INTRAVENOUS | Status: AC
  Administered 2022-06-22: 2 g via INTRAVENOUS
  Filled 2022-06-22: qty 100

## 2022-06-22 MED ORDER — OXYCODONE HCL 5 MG PO TABS
ORAL_TABLET | ORAL | Status: AC
  Filled 2022-06-22: qty 1

## 2022-06-22 MED ORDER — LIDOCAINE 2% (20 MG/ML) 5 ML SYRINGE
INTRAMUSCULAR | Status: DC | PRN
  Administered 2022-06-22: 50 mg via INTRAVENOUS

## 2022-06-22 MED ORDER — ORAL CARE MOUTH RINSE: 15.0000 mL | Freq: Once | OROMUCOSAL | Status: AC

## 2022-06-22 MED ORDER — ACETAMINOPHEN 160 MG/5ML PO SOLN: 325.0000 mg | ORAL | Status: DC | PRN

## 2022-06-22 MED ORDER — AMISULPRIDE (ANTIEMETIC) 5 MG/2ML IV SOLN: 10.0000 mg | Freq: Once | INTRAVENOUS | Status: DC | PRN

## 2022-06-22 MED ORDER — ACETAMINOPHEN 10 MG/ML IV SOLN
INTRAVENOUS | Status: AC
  Filled 2022-06-22: qty 100

## 2022-06-22 MED ORDER — LACTATED RINGERS IV SOLN: INTRAVENOUS | Status: DC

## 2022-06-22 SURGICAL SUPPLY — 16 items
BAG DRN RND TRDRP ANRFLXCHMBR (UROLOGICAL SUPPLIES)
BAG URINE DRAIN 2000ML AR STRL (UROLOGICAL SUPPLIES) IMPLANT
BAG URO CATCHER STRL LF (MISCELLANEOUS) ×2 IMPLANT
DRAPE FOOT SWITCH (DRAPES) ×2 IMPLANT
ELECT REM PT RETURN 15FT ADLT (MISCELLANEOUS) ×2 IMPLANT
GLOVE SURG LX STRL 7.5 STRW (GLOVE) ×2 IMPLANT
GOWN STRL REUS W/ TWL XL LVL3 (GOWN DISPOSABLE) ×2 IMPLANT
GOWN STRL REUS W/TWL XL LVL3 (GOWN DISPOSABLE) ×1
KIT TURNOVER KIT A (KITS) IMPLANT
LOOP CUT BIPOLAR 24F LRG (ELECTROSURGICAL) IMPLANT
MANIFOLD NEPTUNE II (INSTRUMENTS) ×2 IMPLANT
PACK CYSTO (CUSTOM PROCEDURE TRAY) ×2 IMPLANT
SYR TOOMEY IRRIG 70ML (MISCELLANEOUS) ×1
SYRINGE TOOMEY IRRIG 70ML (MISCELLANEOUS) IMPLANT
TUBING CONNECTING 10 (TUBING) ×2 IMPLANT
TUBING UROLOGY SET (TUBING) ×2 IMPLANT

## 2022-06-22 NOTE — Interval H&P Note (Signed)
History and Physical Interval Note:  06/22/2022 2:50 PM  [name]  has presented today for surgery, with the diagnosis of RADIATION CYSTITIS.  The various methods of treatment have been discussed with the patient and family. After consideration of risks, benefits and other options for treatment, the patient has consented to  Procedure(s) with comments: [place] (N/A) - 30 MINS FOR CASE as a surgical intervention.  The patient's history has been reviewed, patient examined, no change in status, stable for surgery.  I have reviewed the patient's chart and labs.  Questions were answered to the patient's satisfaction.     [employer]

## 2022-06-22 NOTE — Progress Notes (Signed)
ABDALRAHMAN, CLEMENTSON ([id number]) [id number] Page 1 of 2 Visit Report for 06/22/2022 Arrival Information Details Patient Name: Date of Service: [name] 06/22/2022 8:00 A M Medical Record Number: [id number] Patient Account Number: [id number] Date of Birth/Sex: Treating RN: May 14, 1966 (57 y.o. [name] Primary Care Lennon Boutwell: [name] Other Clinician: [name] Referring Masha Orbach: Treating Zyara Riling/Extender: [name] in Treatment: 11 Visit Information History Since Last Visit All ordered tests and consults were completed: Yes Patient Arrived: Ambulatory Added or deleted any medications: No Arrival Time: 08:12 Any new allergies or adverse reactions: No Accompanied By: None Had a fall or experienced change in No Transfer Assistance: None activities of daily living that may affect Patient Identification Verified: Yes risk of falls: Secondary Verification Process Completed: Yes Signs or symptoms of abuse/neglect since last visito No Patient Requires Transmission-Based Precautions: No Hospitalized since last visit: No Patient Has Alerts: No Implantable device outside of the clinic excluding No cellular tissue based products placed in the center since last visit: Pain Present Now: Yes Notes Pain 4 out of 10 Electronic Signature(s) Signed: 06/22/2022 2:41:43 PM By: [name] EMT Previous Signature: 06/22/2022 2:40:30 PM Version By: [name] EMT Entered By: [name] on 06/22/2022 14:41:42 -------------------------------------------------------------------------------- Encounter Discharge Information Details Patient Name: Date of Service: [name] 06/22/2022 8:00 A M Medical Record Number: [id number] Patient Account Number: [id number] Date of Birth/Sex: Treating RN: Aug 02, 1965 (57 y.o. [name] Primary Care Tadarrius Burch: [name] Other Clinician: [name] Referring  Helder Crisafulli: Treating Leilanie Rauda/Extender: [name] in Treatment: 11 Encounter Discharge Information Items Discharge Condition: Stable Ambulatory Status: Ambulatory Discharge Destination: Home Transportation: Private Auto Accompanied By: None Schedule Follow-up Appointment: Yes Clinical Summary of Care: Electronic Signature(s) Signed: 06/22/2022 2:49:47 PM By: [name] EMT Entered By: [name] on 06/22/2022 14:49:47 [name] ([id number]) [id number] Page 2 of 2 -------------------------------------------------------------------------------- Vitals Details Patient Name: Date of Service: [name] 06/22/2022 8:00 A M Medical Record Number: [id number] Patient Account Number: [id number] Date of Birth/Sex: Treating RN: Apr 24, 1966 (57 y.o. [name] Primary Care Annalis Kaczmarczyk: [name] Other Clinician: [name] Referring Maximilien Hayashi: Treating Alixandra Alfieri/Extender: [name] in Treatment: 11 Vital Signs Time Taken: 08:26 Temperature (F): 97.3 Height (in): 70 Pulse (bpm): 97 Weight (lbs): 210 Respiratory Rate (breaths/min): 22 Body Mass Index (BMI): 30.1 Blood Pressure (mmHg): 146/72 Capillary Blood Glucose (mg/dl): 330 Reference Range: 80 - 120 mg / dl Electronic Signature(s) Signed: 06/22/2022 2:41:02 PM By: [name] EMT Entered By: [name] on 06/22/2022 14:41:02

## 2022-06-22 NOTE — Op Note (Signed)
Preoperative diagnosis:  1.  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  Metastatic prostate cancer  Postoperative diagnosis: 1.  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  Metastatic prostate cancer  Procedures: 1.  Cystoscopy 2.  Clot evacuation 3.  Fulguration of bladder  Surgeon: Pryor Curia MD  Anesthesia: General  Complications: None  Estimated blood loss: Minimal  Specimens: None  Indication: [name] is a 57 year old gentleman with castrate resistant metastatic prostate cancer.  As part of his treatment, he had undergone radiation therapy in the salvage setting following biochemical failure after initial treatment with radical prostatectomy.  Over the past few months, he developed hematuria that was ongoing requiring hospitalization, transfusion, and cystoscopy with fulguration.  He has been undergoing hyperbaric oxygen therapy but has continued to have intermittent significant bleeding requiring multiple emergency room visits and catheterizations.  He presents today for cystoscopy and fulguration to see if we can further control his bleeding as he completes hyperbaric oxygen therapy.  The potential risks, complications, and expected recovery process was discussed in detail.  Informed consent was obtained.  Description of procedure: The patient was taken the operating room and a general anesthetic was administered.  He was given preoperative antibiotics, placed in the dorsolithotomy position, and prepped and draped in usual sterile fashion.  Next, a preoperative timeout was performed.  Cystourethroscopy was performed with the 26 French resectoscope and the visual obturator.  There was a large amount of clot noted within the bladder that was evacuated with a Toomey syringe.  Inspection of the bladder was then performed.  No bladder tumors were identified.  There was noted to be diffuse radiation changes throughout the bladder with some areas that were slightly oozing but no  actively bleeding areas.  All areas that were oozing and all areas that appeared at risk for bleeding were then fulgurated.  This resulted in approximately 20% of the bladder being fulgurated or an area measuring an estimated 5 to 6 cm.  The ureteral orifice ease were identified and were carefully preserved.  At the end of the procedure, there appeared to be no bleeding even without continuous irrigation.  The resectoscope was therefore removed.  It was elected not to replace the catheter considering the likelihood that the catheter would further cause additional bleeding at this point.  He tolerated the procedure well without complications.  He was able to be awakened and transferred to the recovery unit in satisfactory condition.

## 2022-06-22 NOTE — Anesthesia Procedure Notes (Signed)
Procedure Name: LMA Insertion Date/Time: 06/22/2022 3:50 PM  Performed by: Niel Hummer, CRNAPre-anesthesia Checklist: Patient identified, Emergency Drugs available, Suction available and Patient being monitored Patient Re-evaluated:Patient Re-evaluated prior to induction Oxygen Delivery Method: Circle system utilized Preoxygenation: Pre-oxygenation with 100% oxygen Induction Type: IV induction LMA: LMA inserted LMA Size: 4.0 Number of attempts: 1 Dental Injury: Teeth and Oropharynx as per pre-operative assessment

## 2022-06-22 NOTE — Transfer of Care (Signed)
Immediate Anesthesia Transfer of Care Note  Patient: [name]  Procedure(s) Performed: [place]  Patient Location: PACU  Anesthesia Type:General  Level of Consciousness: awake, alert , and oriented  Airway & Oxygen Therapy: Patient Spontanous Breathing and Patient connected to face mask oxygen  Post-op Assessment: Report given to RN, Post -op Vital signs reviewed and stable, and Patient moving all extremities X 4  Post vital signs: Reviewed and stable  Last Vitals:  Vitals Value Taken Time  BP 192/93 06/22/22 1631  Temp    Pulse 95 06/22/22 1632  Resp 12 06/22/22 1632  SpO2 100 % 06/22/22 1632  Vitals shown include unvalidated device data.  Last Pain:  Vitals:   06/22/22 1522  TempSrc:   PainSc: 1          Complications: No notable events documented.

## 2022-06-22 NOTE — Anesthesia Postprocedure Evaluation (Signed)
Anesthesia Post Note  Patient: [name]  Procedure(s) Performed: [place]     Patient location during evaluation: PACU Anesthesia Type: General Level of consciousness: awake and alert Pain management: pain level controlled Vital Signs Assessment: post-procedure vital signs reviewed and stable Respiratory status: spontaneous breathing, nonlabored ventilation, respiratory function stable and patient connected to nasal cannula oxygen Cardiovascular status: blood pressure returned to baseline and stable Postop Assessment: no apparent nausea or vomiting Anesthetic complications: no  No notable events documented.  Last Vitals:  Vitals:   06/22/22 1800 06/22/22 1815  BP: (!) 170/76 (!) 154/70  Pulse: 77 77  Resp: 17 12  Temp:  36.7 C  SpO2: 100% 100%    Last Pain:  Vitals:   06/22/22 1815  TempSrc:   PainSc: Irmo Pepe Mineau

## 2022-06-22 NOTE — Anesthesia Preprocedure Evaluation (Addendum)
Anesthesia Evaluation  Patient identified by MRN, date of birth, ID band Patient awake    Reviewed: Allergy & Precautions, NPO status , Patient's Chart, lab work & pertinent test results  Airway Mallampati: II  TM Distance: >3 FB Neck ROM: Full    Dental  (+) Teeth Intact, Dental Advisory Given   Pulmonary asthma , former smoker   breath sounds clear to auscultation       Cardiovascular hypertension, Pt. on medications  Rhythm:Regular Rate:Normal     Neuro/Psych negative neurological ROS  negative psych ROS   GI/Hepatic negative GI ROS, Neg liver ROS,,,  Endo/Other  diabetes, Type 2, Insulin Dependent    Renal/GU negative Renal ROS     Musculoskeletal   Abdominal   Peds  Hematology negative hematology ROS (+)   Anesthesia Other Findings   Reproductive/Obstetrics                              Anesthesia Physical Anesthesia Plan  ASA: 2  Anesthesia Plan: General   Post-op Pain Management:    Induction: Intravenous  PONV Risk Score and Plan: 3 and Ondansetron, Dexamethasone and Midazolam  Airway Management Planned: LMA  Additional Equipment: None  Intra-op Plan:   Post-operative Plan: Extubation in OR  Informed Consent: I have reviewed the patients History and Physical, chart, labs and discussed the procedure including the risks, benefits and alternatives for the proposed anesthesia with the patient or authorized representative who has indicated his/her understanding and acceptance.     Dental advisory given  Plan Discussed with: CRNA  Anesthesia Plan Comments:         Anesthesia Quick Evaluation

## 2022-06-22 NOTE — Discharge Instructions (Signed)
You may see some blood in the urine and may have some burning with urination for 48-72 hours. You also may notice that you have to urinate more frequently or urgently after your procedure which is normal.  You should call should you develop an inability urinate, fever > 101, persistent nausea and vomiting that prevents you from eating or drinking to stay hydrated.    

## 2022-06-23 ENCOUNTER — Encounter (HOSPITAL_COMMUNITY): Payer: Self-pay | Admitting: Urology

## 2022-06-23 ENCOUNTER — Encounter (HOSPITAL_BASED_OUTPATIENT_CLINIC_OR_DEPARTMENT_OTHER): Payer: BC Managed Care – PPO | Admitting: General Surgery

## 2022-06-23 NOTE — Progress Notes (Signed)
MAGIC, MOHLER ([id number]) [id number] Page 1 of 2 Visit Report for 06/22/2022 Problem List Details Patient Name: Date of Service: [name] MES E. 06/22/2022 8:00 A M Medical Record Number: [id number] Patient Account Number: [id number] Date of Birth/Sex: Treating RN: 26-May-1966 (57 y.o. 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 06/22/2022 Medical Record Number: [id number] Patient Account Number: [id number] Date of Birth/Sex: Treating RN: 09/12/1965 (57 y.o. 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 with  traction retinal detachment involving the macula, unspecified eye Facility Procedures : CPT4 Code: [id number] Description: G0277-(Facility Use Only) HBOT full body chamber, 59mn , ICD-10 Diagnosis Description 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 Hematuria, unspecified C61 Malignant neoplasm of prostate Modifier: Quantity: 4 Physician Procedures : CPT4 Code Description Modifier [id number] PHYS HYPERBARIC OXYGEN THERAPY ICD-10 Diagnosis Description 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 Hematuria, unspecified C61 Malignant neoplasm of prostate Quantity: 1 Electronic Signature(s) Signed: 06/22/2022 2:49:15 PM By: GValeria BatmanEMT Signed: 06/22/2022 5:11:08 PM By: CFredirick MaudlinMD FACS Entered By: GValeria Batmanon 06/22/2022 14:49:15

## 2022-06-23 NOTE — Progress Notes (Signed)
AYCEN, PORRECA ([id number]) [id number] Page 1 of 2 Visit Report for 06/22/2022 HBO Details Patient Name: Date of Service: [name]. 06/22/2022 8:00 A M Medical Record Number: [id number] Patient Account Number: [id number] Date of Birth/Sex: Treating RN: 1965/11/10 (57 y.o. [name] Primary Care Darnel Mchan: [name] Other Clinician: [name] Referring Careen Mauch: Treating Perl Folmar/Extender: [name] in Treatment: 11 HBO Treatment Course Details Treatment Course Number: 1 Ordering Amrom Ore: [name] T Treatments Ordered: otal 60 HBO Treatment Start Date: 04/09/2022 HBO Indication: Late Effect of Radiation HBO Treatment Details Treatment Number: 45 Patient Type: Outpatient Chamber Type: Monoplace Chamber Serial #: [id number] Treatment Protocol: 2.0 ATA with 90 minutes oxygen, and no air breaks Treatment Details Compression Rate Down: 1.0 psi / minute De-Compression Rate Up: Air breaks and breathing Decompress Decompress Compress Tx Pressure Begins Reached periods Begins Ends (leave unused spaces blank) Chamber Pressure (ATA 1 2 ------2 1 ) Clock Time (24 hr) 08:37 08:48 - - - - - - 10:18 10:26 Treatment Length: 109 (minutes) Treatment Segments: 4 Vital Signs Capillary Blood Glucose Reference Range: 80 - 120 mg / dl HBO Diabetic Blood Glucose Intervention Range: <131 mg/dl or >249 mg/dl Type: Time Vitals Blood Pulse: Respiratory Temperature: Capillary Blood Glucose Pulse Action Taken: Pressure: Rate: Glucose (mg/dl): Meter #: Oximetry (%) Taken: Pre 08:26 146/72 97 22 97.3 330 Dr. Celine Ahr informed of blood sugar Post 10:33 134/49 90 20 97.4 321 Dr. Celine Ahr informed of blood sugar Treatment Response Treatment Toleration: Well Treatment Completion Status: Treatment Completed without Adverse Event Physician HBO Attestation: I certify that I supervised this HBO treatment in accordance with  Medicare guidelines. A trained emergency response team is readily available per Yes hospital policies and procedures. Continue HBOT as ordered. Yes Electronic Signature(s) Signed: 06/22/2022 5:11:56 PM By: [name] MD FACS Previous Signature: 06/22/2022 2:48:28 PM Version By: [name] EMT Entered By: [name] on 06/22/2022 17:11:55 [name] ([id number]) [id number] Page 2 of 2 -------------------------------------------------------------------------------- HBO Safety Checklist Details Patient Name: Date of Service: [name]. 06/22/2022 8:00 A M Medical Record Number: [id number] Patient Account Number: [id number] Date of Birth/Sex: Treating RN: 1966-04-13 (57 y.o. [name] Primary Care Courtez Twaddle: [name] Other Clinician: [name] Referring Glenette Bookwalter: Treating Clorissa Gruenberg/Extender: [name] in Treatment: 11 [name] Safety Checklist Consent Form Signed Patient voided / foley secured and emptied When did you last eato Last night Last dose of injectable or oral agent 0700 Ostomy pouch emptied and vented if applicable NA All implantable devices assessed, documented and approved foley cath to bedside drain bag Intravenous access site secured and place NA Valuables secured Linens and cotton and cotton/polyester blend (less than 51% polyester) Personal oil-based products / skin lotions / body lotions removed Wigs or hairpieces removed NA Smoking or tobacco materials removed Books / newspapers / magazines / loose paper removed Cologne, aftershave, perfume and deodorant removed Jewelry removed (may wrap wedding band) Make-up removed NA Hair care products removed Battery operated devices (external) removed Heating patches and chemical warmers removed Titanium eyewear removed NA Plastic frames Nail polish cured greater than 10 hours NA Casting material cured greater  than 10 hours NA Hearing aids removed NA Loose dentures or partials removed NA Prosthetics have been removed NA Patient demonstrates correct use of air break device (if applicable) Patient concerns have been addressed Patient grounding bracelet on and cord attached to chamber Specifics for Inpatients (complete in  addition to above) Medication sheet sent with patient NA Intravenous medications needed or due during therapy sent with patient NA Drainage tubes ([name].g. nasogastric tube or chest tube secured and vented) NA Endotracheal or Tracheotomy tube secured NA Cuff deflated of air and inflated with saline NA Airway suctioned NA Notes The safety checklist was done before the treatment was started. The patient stated that he did not eat today due to having surgery today after treatment today. Electronic Signature(s) Signed: 06/22/2022 2:48:45 PM By: [name] EMT Previous Signature: 06/22/2022 2:45:35 PM Version By: [name] EMT Entered By: [name] on 06/22/2022 14:48:45

## 2022-06-24 ENCOUNTER — Encounter (HOSPITAL_BASED_OUTPATIENT_CLINIC_OR_DEPARTMENT_OTHER): Payer: BC Managed Care – PPO | Admitting: General Surgery

## 2022-06-24 DIAGNOSIS — N3041 Irradiation cystitis with hematuria: Secondary | ICD-10-CM | POA: Diagnosis not present

## 2022-06-24 LAB — GLUCOSE, CAPILLARY
Glucose-Capillary: 248 mg/dL — ABNORMAL HIGH (ref 70–99)
Glucose-Capillary: 213 mg/dL — ABNORMAL HIGH (ref 70–99)

## 2022-06-24 NOTE — Progress Notes (Signed)
YEE, JOSS ([id number]) [id number] Page 1 of 2 Visit Report for 06/24/2022 HBO Details Patient Name: Date of Service: Dillon Dun MES E. 06/24/2022 8:00 A M Medical Record Number: [id number] Patient Account Number: [id number] Date of Birth/Sex: Treating RN: [birth year] (57 y.o. [name] Primary Care Patty Lopezgarcia: [name] Other Clinician: [name] Referring Daejah Klebba: Treating Jacob Cicero/Extender: [name] in Treatment: 12 HBO Treatment Course Details Treatment Course Number: 1 Ordering Shalin Vonbargen: [name] T Treatments Ordered: otal 60 HBO Treatment Start Date: 04/09/2022 HBO Indication: Late Effect of Radiation HBO Treatment Details Treatment Number: 46 Patient Type: Outpatient Chamber Type: Monoplace Chamber Serial #: [id number] Treatment Protocol: 2.0 ATA with 90 minutes oxygen, and no air breaks Treatment Details Compression Rate Down: 2.0 psi / minute De-Compression Rate Up: 2.0 psi / minute Air breaks and breathing Decompress Decompress Compress Tx Pressure Begins Reached periods Begins Ends (leave unused spaces blank) Chamber Pressure (ATA 1 2 ------2 1 ) Clock Time (24 hr) 08:13 08:22 - - - - - - 09:52 10:01 Treatment Length: 108 (minutes) Treatment Segments: 4 Vital Signs Capillary Blood Glucose Reference Range: 80 - 120 mg / dl HBO Diabetic Blood Glucose Intervention Range: <131 mg/dl or >249 mg/dl Time Vitals Blood Respiratory Capillary Blood Glucose Pulse Action Type: Pulse: Temperature: Taken: Pressure: Rate: Glucose (mg/dl): Meter #: Oximetry (%) Taken: Pre 08:09 125/64 86 18 97.3 248 Post 10:16 170/80 100 18 97.3 213 Treatment Response Treatment Toleration: Well Treatment Completion Status: Treatment Completed without Adverse Event Physician HBO Attestation: I certify that I supervised this HBO treatment in accordance with Medicare guidelines. A trained emergency  response team is readily available per Yes hospital policies and procedures. Continue HBOT as ordered. Yes Electronic Signature(s) Signed: 06/24/2022 1:49:46 PM By: [name] MD FACS Previous Signature: 06/24/2022 1:44:41 PM Version By: [name] EMT Entered By: [name] on 06/24/2022 13:49:46 [name] ([id number]) [id number] Page 2 of 2 -------------------------------------------------------------------------------- HBO Safety Checklist Details Patient Name: Date of Service: Dillon Dun MES E. 06/24/2022 8:00 A M Medical Record Number: [id number] Patient Account Number: [id number] Date of Birth/Sex: Treating RN: [birth year] (57 y.o. [name] Primary Care Glenda Spelman: [name] Other Clinician: [name] Referring Lyndsee Casa: Treating Lissie Hinesley/Extender: [name] in Treatment: 12 [name] Safety Checklist Consent Form Signed Patient voided / foley secured and emptied When did you last eato 0650 Last dose of injectable or oral agent 0710 Ostomy pouch emptied and vented if applicable NA All implantable devices assessed, documented and approved NA Intravenous access site secured and place NA Valuables secured Linens and cotton and cotton/polyester blend (less than 51% polyester) Personal oil-based products / skin lotions / body lotions removed Wigs or hairpieces removed NA Smoking or tobacco materials removed Books / newspapers / magazines / loose paper removed Cologne, aftershave, perfume and deodorant removed Jewelry removed (may wrap wedding band) Make-up removed NA Hair care products removed Battery operated devices (external) removed Heating patches and chemical warmers removed Titanium eyewear removed NA Plastic frames Nail polish cured greater than 10 hours NA Casting material cured greater than 10 hours NA Hearing aids removed NA Loose dentures or  partials removed NA Prosthetics have been removed NA Patient demonstrates correct use of air break device (if applicable) Patient concerns have been addressed Patient grounding bracelet on and cord attached to chamber Specifics for Inpatients (complete in addition to above) Medication sheet sent with patient NA Intravenous medications needed or due  during therapy sent with patient NA Drainage tubes (Mooreg. nasogastric tube or chest tube secured and vented) NA Endotracheal or Tracheotomy tube secured NA Cuff deflated of air and inflated with saline NA Airway suctioned NA Notes The safety checklist was done before the treatment was started. Electronic Signature(s) Signed: 06/24/2022 1:42:00 PM By: [name] EMT Entered By: [name] on 06/24/2022 13:41:59

## 2022-06-24 NOTE — Progress Notes (Signed)
GERALD, KUEHL ([id number]) [id number] Page 1 of 2 Visit Report for 06/24/2022 Problem List Details Patient Name: Date of Service: Dillon Dun MES E. 06/24/2022 8:00 A M Medical Record Number: [id number] Patient Account Number: [id number] Date of Birth/Sex: Treating RN: Feb 07, 1966 (57 y.o. 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 06/24/2022 Medical Record Number: [id number] Patient Account Number: [id number] Date of Birth/Sex: Treating RN: [birth year] (57 y.o. [name] Primary Care Provider: Reynold [name] Other Clinician: [name] Referring Provider: Treating Provider/Extender: [name] in Treatment: 12 Diagnosis Coding ICD-10 Codes Code Description N30.41 Irradiation cystitis with hematuria [name], [name] ([id number]) [id number] Page 2 of 2 R31.9 Hematuria, unspecified C61 Malignant neoplasm of prostate E10.3529 Type 1 diabetes mellitus with proliferative diabetic  retinopathy with traction retinal detachment involving the macula, unspecified eye Facility Procedures : CPT4 Code: [id number] Description: G0277-(Facility Use Only) HBOT full body chamber, 73mn , ICD-10 Diagnosis Description 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 Hematuria, unspecified C61 Malignant neoplasm of prostate Modifier: Quantity: 4 Physician Procedures : CPT4 Code Description Modifier [id number]- WC PHYS HYPERBARIC OXYGEN THERAPY ICD-10 Diagnosis Description 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 Hematuria, unspecified C61 Malignant neoplasm of prostate Quantity: 1 Electronic Signature(s) Signed: 06/24/2022 1:44:59 PM By: GValeria BatmanEMT Signed: 06/24/2022 1:49:22 PM By: CFredirick MaudlinMD FACS Entered By: GValeria Batmanon 06/24/2022 13:44:59

## 2022-06-24 NOTE — Progress Notes (Signed)
AUSTINE, WIEDEMAN ([id number]) [id number] Page 1 of 2 Visit Report for 06/24/2022 Arrival Information Details Patient Name: Date of Service: [name] MES E. 06/24/2022 8:00 A M Medical Record Number: [id number] Patient Account Number: [id number] Date of Birth/Sex: Treating RN: Sep 01, 1965 (57 y.o. [name] Primary Care Briel Gallicchio: [name] Other Clinician: [name] Referring Haelyn Forgey: Treating Hyacinth Marcelli/Extender: [name] in Treatment: 12 Visit Information History Since Last Visit All ordered tests and consults were completed: Yes Patient Arrived: Ambulatory Added or deleted any medications: No Arrival Time: 07:47 Any new allergies or adverse reactions: No Accompanied By: None Had a fall or experienced change in No Transfer Assistance: None activities of daily living that may affect Patient Identification Verified: Yes risk of falls: Secondary Verification Process Completed: Yes Signs or symptoms of abuse/neglect since last visito No Patient Requires Transmission-Based Precautions: No Hospitalized since last visit: Yes Patient Has Alerts: No Implantable device outside of the clinic excluding No cellular tissue based products placed in the center since last visit: Pain Present Now: Yes Notes Patient stated that his pain was 8 out of 10. The patient had a Cystoscopy with Fulguration on 06/22/2022. Electronic Signature(s) Signed: 06/24/2022 1:41:10 PM By: [name] EMT Previous Signature: 06/24/2022 1:39:41 PM Version By: [name] EMT Entered By: [name] on 06/24/2022 13:41:10 -------------------------------------------------------------------------------- Encounter Discharge Information Details Patient Name: Date of Service: [name] MES E. 06/24/2022 8:00 A M Medical Record Number: [id number] Patient Account Number: [id number] Date of Birth/Sex: Treating RN: 11/19/1965 (57 y.o. [name] Primary Care Willman Cuny: [name] Other Clinician: [name] Referring Carolynne Schuchard: Treating Dylann Gallier/Extender: [name] in Treatment: 12 Encounter Discharge Information Items Discharge Condition: Stable Ambulatory Status: Ambulatory Discharge Destination: Home Transportation: Private Auto Accompanied By: None Schedule Follow-up Appointment: Yes Clinical Summary of Care: Electronic Signature(s) Signed: 06/24/2022 1:45:33 PM By: [name] EMT Entered By: [name] on 06/24/2022 13:45:33 [name] ([id number]) [id number] Page 2 of 2 -------------------------------------------------------------------------------- Vitals Details Patient Name: Date of Service: [name] MES E. 06/24/2022 8:00 A M Medical Record Number: [id number] Patient Account Number: [id number] Date of Birth/Sex: Treating RN: 04-05-1966 (57 y.o. [name] Primary Care Jadzia Ibsen: [name] Other Clinician: [name] Referring Aylen Rambert: Treating Marga Gramajo/Extender: [name] in Treatment: 12 Vital Signs Time Taken: 08:09 Temperature (F): 97.3 Height (in): 70 Pulse (bpm): 86 Weight (lbs): 210 Respiratory Rate (breaths/min): 18 Body Mass Index (BMI): 30.1 Blood Pressure (mmHg): 125/64 Capillary Blood Glucose (mg/dl): 248 Reference Range: 80 - 120 mg / dl Electronic Signature(s) Signed: 06/24/2022 1:40:09 PM By: [name] EMT Entered By: [name] on 06/24/2022 13:40:09

## 2022-06-25 ENCOUNTER — Encounter (HOSPITAL_BASED_OUTPATIENT_CLINIC_OR_DEPARTMENT_OTHER): Payer: BC Managed Care – PPO | Admitting: General Surgery

## 2022-06-25 DIAGNOSIS — N3041 Irradiation cystitis with hematuria: Secondary | ICD-10-CM | POA: Diagnosis not present

## 2022-06-25 LAB — GLUCOSE, CAPILLARY
Glucose-Capillary: 171 mg/dL — ABNORMAL HIGH (ref 70–99)
Glucose-Capillary: 193 mg/dL — ABNORMAL HIGH (ref 70–99)

## 2022-06-25 NOTE — Progress Notes (Signed)
ROLANDO, HESSLING ([id number]) [id number] Page 1 of 2 Visit Report for 06/25/2022 Arrival Information Details Patient Name: Date of Service: [name] MES E. 06/25/2022 8:00 A M Medical Record Number: [id number] Patient Account Number: [id number] Date of Birth/Sex: Treating RN: 1966/04/16 (57 y.o. [name], Meta.Reding Primary Care Cevin Rubinstein: [name] Other Clinician: [name] Referring Tykee Heideman: Treating Lorelei Heikkila/Extender: [name] in Treatment: 12 Visit Information History Since Last Visit All ordered tests and consults were completed: Yes Patient Arrived: Ambulatory Added or deleted any medications: No Arrival Time: 07:52 Any new allergies or adverse reactions: No Accompanied By: None Had a fall or experienced change in No Transfer Assistance: None activities of daily living that may affect Patient Identification Verified: Yes risk of falls: Secondary Verification Process Completed: Yes Signs or symptoms of abuse/neglect since last visito No Patient Requires Transmission-Based Precautions: No Hospitalized since last visit: No Patient Has Alerts: No Implantable device outside of the clinic excluding No cellular tissue based products placed in the center since last visit: Pain Present Now: No Electronic Signature(s) Signed: 06/25/2022 1:45:17 PM By: [name] EMT Entered By: [name] on 06/25/2022 13:45:16 -------------------------------------------------------------------------------- Encounter Discharge Information Details Patient Name: Date of Service: [name] MES E. 06/25/2022 8:00 A M Medical Record Number: [id number] Patient Account Number: [id number] Date of Birth/Sex: Treating RN: April 01, 1966 (57 y.o. [name] Primary Care Fardeen Steinberger: [name] Other Clinician: [name] Referring Prue Lingenfelter: Treating Jordyne Poehlman/Extender: [name] in Treatment:  12 Encounter Discharge Information Items Discharge Condition: Stable Ambulatory Status: Ambulatory Discharge Destination: Home Transportation: Private Auto Accompanied By: None Schedule Follow-up Appointment: Yes Clinical Summary of Care: Electronic Signature(s) Signed: 06/25/2022 1:50:41 PM By: [name] EMT Entered By: [name] on 06/25/2022 13:50:41 [name] ([id number]) [id number] Page 2 of 2 -------------------------------------------------------------------------------- Vitals Details Patient Name: Date of Service: [name] MES E. 06/25/2022 8:00 A M Medical Record Number: [id number] Patient Account Number: [id number] Date of Birth/Sex: Treating RN: [birth year] (57 y.o. [name] Primary Care Jennelle Pinkstaff: [name] Other Clinician: [name] Referring Parthiv Mucci: Treating Francetta Ilg/Extender: [name] in Treatment: 12 Vital Signs Time Taken: 08:08 Temperature (F): 97.5 Height (in): 70 Pulse (bpm): 91 Weight (lbs): 210 Respiratory Rate (breaths/min): 18 Body Mass Index (BMI): 30.1 Blood Pressure (mmHg): 139/66 Capillary Blood Glucose (mg/dl): 171 Reference Range: 80 - 120 mg / dl Electronic Signature(s) Signed: 06/25/2022 1:45:41 PM By: [name] EMT Entered By: [name] on 06/25/2022 13:45:41

## 2022-06-25 NOTE — Progress Notes (Signed)
LAURANCE, HEIDE ([id number]) [id number] Page 1 of 2 Visit Report for 06/25/2022 Problem List Details Patient Name: Date of Service: Dillon Dun MES E. 06/25/2022 8:00 A M Medical Record Number: [id number] Patient Account Number: [id number] Date of Birth/Sex: Treating RN: [birth year] (57 y.o. 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 06/25/2022 Medical Record Number: [id number] Patient Account Number: [id number] Date of Birth/Sex: Treating RN: March 01, 1966 (57 y.o. [name] Primary Care Provider: [name] Other Clinician: [name] Referring Provider: Treating Provider/Extender: [name] in Treatment: 12 Diagnosis Coding ICD-10 Codes Code Description N30.41 Irradiation cystitis with hematuria TIMOTHY, TRUDELL ([id number]) [id number] Page 2 of 2 R31.9 Hematuria, unspecified C61 Malignant neoplasm of prostate E10.3529 Type 1 diabetes mellitus with proliferative diabetic retinopathy  with traction retinal detachment involving the macula, unspecified eye Facility Procedures : CPT4 Code: 27782423 Description: G0277-(Facility Use Only) HBOT full body chamber, 81mn , ICD-10 Diagnosis Description 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 Hematuria, unspecified C61 Malignant neoplasm of prostate Modifier: Quantity: 4 Physician Procedures : CPT4 Code Description Modifier [id number] PHYS HYPERBARIC OXYGEN THERAPY ICD-10 Diagnosis Description 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 Hematuria, unspecified C61 Malignant neoplasm of prostate Quantity: 1 Electronic Signature(s) Signed: 06/25/2022 1:50:11 PM By: GValeria BatmanEMT Signed: 06/25/2022 2:57:10 PM By: CFredirick MaudlinMD FACS Entered By: GValeria Batmanon 06/25/2022 13:50:11

## 2022-06-25 NOTE — Progress Notes (Signed)
VIPUL, CAFARELLI ([id number]) [id number] Page 1 of 2 Visit Report for 06/25/2022 HBO Details Patient Name: Date of Service: [name] MES E. 06/25/2022 8:00 A M Medical Record Number: [id number] Patient Account Number: [id number] Date of Birth/Sex: Treating RN: [birth year] (57 y.o. [name] Primary Care Tashea Othman: [name] Other Clinician: [name] Referring Sharis Keeran: Treating Taylan Marez/Extender: [name] in Treatment: 12 HBO Treatment Course Details Treatment Course Number: 1 Ordering Reese Senk: [name] T Treatments Ordered: otal 60 HBO Treatment Start Date: 04/09/2022 HBO Indication: Late Effect of Radiation HBO Treatment Details Treatment Number: 47 Patient Type: Outpatient Chamber Type: Monoplace Chamber Serial #: [id number] Treatment Protocol: 2.0 ATA with 90 minutes oxygen, and no air breaks Treatment Details Compression Rate Down: 2.0 psi / minute De-Compression Rate Up: 2.0 psi / minute Air breaks and breathing Decompress Decompress Compress Tx Pressure Begins Reached periods Begins Ends (leave unused spaces blank) Chamber Pressure (ATA 1 2 ------2 1 ) Clock Time (24 hr) 08:17 08:24 - - - - - - 09:55 10:03 Treatment Length: 106 (minutes) Treatment Segments: 4 Vital Signs Capillary Blood Glucose Reference Range: 80 - 120 mg / dl HBO Diabetic Blood Glucose Intervention Range: <131 mg/dl or >249 mg/dl Time Vitals Blood Respiratory Capillary Blood Glucose Pulse Action Type: Pulse: Temperature: Taken: Pressure: Rate: Glucose (mg/dl): Meter #: Oximetry (%) Taken: Pre 08:08 139/66 91 18 97.5 171 Post 10:10 143/79 85 18 97.5 193 Treatment Response Treatment Toleration: Well Treatment Completion Status: Treatment Completed without Adverse Event Treatment Notes Patient had Banana and toast before coming in for treatment today. Physician HBO Attestation: Continue HBOT as ordered.  Yes Electronic Signature(s) Signed: 06/25/2022 3:09:39 PM By: [name] MD FACS Previous Signature: 06/25/2022 1:49:15 PM Version By: [name] EMT Entered By: [name] on 06/25/2022 15:09:39 [name] ([id number]) [id number] Page 2 of 2 -------------------------------------------------------------------------------- HBO Safety Checklist Details Patient Name: Date of Service: [name] MES E. 06/25/2022 8:00 A M Medical Record Number: [id number] Patient Account Number: [id number] Date of Birth/Sex: Treating RN: 09/18/1965 (57 y.o. [name], Meta.Reding Primary Care Corliss Coggeshall: [name] Other Clinician: [name] Referring Huntleigh Doolen: Treating Kyro Joswick/Extender: [name] in Treatment: 12 [name] Safety Checklist Consent Form Signed Patient voided / foley secured and emptied When did you last eato 0700 Last dose of injectable or oral agent 0717 Ostomy pouch emptied and vented if applicable NA All implantable devices assessed, documented and approved NA Intravenous access site secured and place Valuables secured Linens and cotton and cotton/polyester blend (less than 51% polyester) Personal oil-based products / skin lotions / body lotions removed Wigs or hairpieces removed NA Smoking or tobacco materials removed Books / newspapers / magazines / loose paper removed Cologne, aftershave, perfume and deodorant removed Jewelry removed (may wrap wedding band) Make-up removed NA Hair care products removed Battery operated devices (external) removed Heating patches and chemical warmers removed Titanium eyewear removed NA Plastic frames Nail polish cured greater than 10 hours NA Casting material cured greater than 10 hours NA Hearing aids removed NA Loose dentures or partials removed NA Prosthetics have been removed NA Patient demonstrates correct use of air break device (if  applicable) Patient concerns have been addressed Patient grounding bracelet on and cord attached to chamber Specifics for Inpatients (complete in addition to above) Medication sheet sent with patient NA Intravenous medications needed or due during therapy sent with patient NA Drainage tubes (e.g. nasogastric tube or chest tube secured  and vented) NA Endotracheal or Tracheotomy tube secured NA Cuff deflated of air and inflated with saline NA Airway suctioned NA Notes The safety checklist was done before the treatment was started. Electronic Signature(s) Signed: 06/25/2022 1:49:47 PM By: [name] EMT Previous Signature: 06/25/2022 1:46:54 PM Version By: [name] EMT Entered By: [name] on 06/25/2022 13:49:47

## 2022-06-26 ENCOUNTER — Encounter (HOSPITAL_BASED_OUTPATIENT_CLINIC_OR_DEPARTMENT_OTHER): Payer: BC Managed Care – PPO | Admitting: General Surgery

## 2022-06-26 DIAGNOSIS — N3041 Irradiation cystitis with hematuria: Secondary | ICD-10-CM | POA: Diagnosis not present

## 2022-06-26 LAB — GLUCOSE, CAPILLARY
Glucose-Capillary: 87 mg/dL (ref 70–99)
Glucose-Capillary: 101 mg/dL — ABNORMAL HIGH (ref 70–99)
Glucose-Capillary: 105 mg/dL — ABNORMAL HIGH (ref 70–99)
Glucose-Capillary: 132 mg/dL — ABNORMAL HIGH (ref 70–99)
Glucose-Capillary: 314 mg/dL — ABNORMAL HIGH (ref 70–99)

## 2022-06-26 NOTE — Progress Notes (Signed)
KHADEEM, ROCKETT ([id number]) [id number] Page 1 of 2 Visit Report for 06/26/2022 HBO Details Patient Name: Date of Service: [name] MES E. 06/26/2022 8:00 A M Medical Record Number: [id number] Patient Account Number: [id number] Date of Birth/Sex: Treating RN: 24-Jun-1965 (57 y.o. [name] Primary Care Rahm Minix: [name] Other Clinician: [name] Referring Latrenda Irani: Treating Taelyn Nemes/Extender: [name] in Treatment: 12 HBO Treatment Course Details Treatment Course Number: 1 Ordering Aniyla Harling: [name] T Treatments Ordered: otal 60 HBO Treatment Start Date: 04/09/2022 HBO Indication: Late Effect of Radiation HBO Treatment Details Treatment Number: 48 Patient Type: Outpatient Chamber Type: Monoplace Chamber Serial #: [id number] Treatment Protocol: 2.0 ATA with 90 minutes oxygen, and no air breaks Treatment Details Compression Rate Down: 2.0 psi / minute De-Compression Rate Up: 2.0 psi / minute Air breaks and breathing Decompress Decompress Compress Tx Pressure Begins Reached periods Begins Ends (leave unused spaces blank) Chamber Pressure (ATA 1 2 ------2 1 ) Clock Time (24 hr) 09:38 09:46 - - - - - - 11:16 11:24 Treatment Length: 106 (minutes) Treatment Segments: 4 Vital Signs Capillary Blood Glucose Reference Range: 80 - 120 mg / dl HBO Diabetic Blood Glucose Intervention Range: <131 mg/dl or >249 mg/dl Type: Time Vitals Blood Pulse: Respiratory Temperature: Capillary Blood Glucose Pulse Action Taken: Pressure: Rate: Glucose (mg/dl): Meter #: Oximetry (%) Taken: Pre 08:03 87 Patient given 8 oz glucerna Post 11:28 173/57 88 20 97 314 Dr. Celine Ahr informed of blood sugar Pre 08:42 105 Spoke with Dr. Celine Ahr. Pre 08:56 46 Spoke with Dr. Celine Ahr Pre 09:22 132 Pre 09:30 136/72 83 18 97 Treatment Response Treatment Toleration: Well Treatment Completion Status: Treatment Completed without  Adverse Event Treatment Notes At 0737 Blood sugar was 105. Spoke with Dr. Celine Ahr. Dr. Celine Ahr stated to give him another 8 oz Glucerna with Orange juice. At 0856 Blood sugar was 101. Spoke with Dr. Celine Ahr. Dr. Celine Ahr stated to give him something to eat. The patient was given 2 pk of crackers with 2 pk of peanut butter. Physician HBO Attestation: I certify that I supervised this HBO treatment in accordance with Medicare guidelines. A trained emergency response team is readily available per Yes hospital policies and procedures. Continue HBOT as ordered. Yes Electronic Signature(s) Signed: 06/26/2022 2:57:23 PM By: [name] MD FACS ADAL, SERENO ([id number]) PM By: [name] MD FACS [phone].pdf Page 2 of 2 Signed: 06/26/2022 2:57:23 Previous Signature: 06/26/2022 1:26:07 PM Version By: [name] EMT Entered By: [name] on 06/26/2022 14:57:23 -------------------------------------------------------------------------------- HBO Safety Checklist Details Patient Name: Date of Service: [name] MES E. 06/26/2022 8:00 A M Medical Record Number: [id number] Patient Account Number: [id number] Date of Birth/Sex: Treating RN: 11-Apr-1966 (57 y.o. [name] Primary Care Ricardo Schubach: [name] Other Clinician: [name] Referring Rana Hochstein: Treating Jamaia Brum/Extender: [name] in Treatment: 12 [name] Safety Checklist Consent Form Signed Patient voided / foley secured and emptied When did you last eato 0630 Last dose of injectable or oral agent 0700 Ostomy pouch emptied and vented if applicable NA All implantable devices assessed, documented and approved NA Intravenous access site secured and place NA Valuables secured Linens and cotton and cotton/polyester blend (less than 51% polyester) Personal oil-based products / skin lotions / body lotions removed Wigs or hairpieces  removed NA Smoking or tobacco materials removed Books / newspapers / magazines / loose paper removed Cologne, aftershave, perfume and deodorant removed Jewelry removed (may wrap wedding band) NA  Make-up removed Hair care products removed Battery operated devices (external) removed Heating patches and chemical warmers removed Titanium eyewear removed NA Plastic frames Nail polish cured greater than 10 hours NA Casting material cured greater than 10 hours NA Hearing aids removed NA Loose dentures or partials removed NA Prosthetics have been removed NA Patient demonstrates correct use of air break device (if applicable) Patient concerns have been addressed Patient grounding bracelet on and cord attached to chamber Specifics for Inpatients (complete in addition to above) Medication sheet sent with patient NA Intravenous medications needed or due during therapy sent with patient NA Drainage tubes (e.g. nasogastric tube or chest tube secured and vented) NA Endotracheal or Tracheotomy tube secured NA Cuff deflated of air and inflated with saline NA Airway suctioned NA Notes The safety checklist was done before the treatment was started. The patient stated that he had 1/2 of Sausage egg and cheese with an orange before coming in for treatment today. Electronic Signature(s) Signed: 06/26/2022 12:57:53 PM By: [name] EMT Entered By: [name] on 06/26/2022 12:57:52

## 2022-06-26 NOTE — Progress Notes (Signed)
[name], [name] ([id number]) [id number] Page 1 of 2 Visit Report for 06/26/2022 Problem List Details Patient Name: Date of Service: Dillon Dun MES E. 06/26/2022 8:00 A M Medical Record Number: [id number] Patient Account Number: [id number] Date of Birth/Sex: Treating RN: 27-Jul-1965 (57 y.o. 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 06/26/2022 Medical Record Number: [id number] Patient Account Number: [id number] Date of Birth/Sex: Treating RN: 1966-04-04 (57 y.o. [name] Primary Care Provider: [name] Other Clinician: [name] Referring Provider: Treating Provider/Extender: [name] in Treatment: 12 Diagnosis Coding ICD-10 Codes Code Description N30.41 Irradiation cystitis with hematuria [name], [name] ([id number]) [id number] Page 2 of 2 R31.9 Hematuria, unspecified C61 Malignant neoplasm of prostate E10.3529 Type 1 diabetes mellitus with proliferative diabetic  retinopathy with traction retinal detachment involving the macula, unspecified eye Facility Procedures : CPT4 Code: [id number] Description: G0277-(Facility Use Only) HBOT full body chamber, 32mn , ICD-10 Diagnosis Description 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 Hematuria, unspecified C61 Malignant neoplasm of prostate Modifier: Quantity: 4 Physician Procedures : CPT4 Code Description Modifier [id number]- [id number] PHYS HYPERBARIC OXYGEN THERAPY ICD-10 Diagnosis Description 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 Hematuria, unspecified C61 Malignant neoplasm of prostate Quantity: 1 Electronic Signature(s) Signed: 06/26/2022 1:26:29 PM By: GValeria BatmanEMT Signed: 06/26/2022 2:56:55 PM By: CFredirick MaudlinMD FACS Entered By: GValeria Batmanon 06/26/2022 13:26:29

## 2022-06-26 NOTE — Progress Notes (Signed)
[name] ([id number]) [id number] Page 1 of 2 Visit Report for 06/26/2022 Arrival Information Details Patient Name: Date of Service: Dillon Dun MES E. 06/26/2022 8:00 A M Medical Record Number: [id number] Patient Account Number: [id number] Date of Birth/Sex: Treating RN: September 02, 1965 (57 y.o. [name] Primary Care [name]: [name] Other Clinician: [name] Referring [name]: Treating [name]/Extender: [name] in Treatment: 12 Visit Information History Since Last Visit All ordered tests and consults were completed: Yes Patient Arrived: Ambulatory Added or deleted any medications: No Arrival Time: 07:47 Any new allergies or adverse reactions: No Accompanied By: None Had a fall or experienced change in No Transfer Assistance: None activities of daily living that may affect Patient Identification Verified: Yes risk of falls: Secondary Verification Process Completed: Yes Signs or symptoms of abuse/neglect since last visito No Patient Requires Transmission-Based Precautions: No Hospitalized since last visit: No Patient Has Alerts: No Implantable device outside of the clinic excluding No cellular tissue based products placed in the center since last visit: Pain Present Now: No Electronic Signature(s) Signed: 06/26/2022 12:51:41 PM By: [name] EMT Entered By: [name] on 06/26/2022 12:51:41 -------------------------------------------------------------------------------- Encounter Discharge Information Details Patient Name: Date of Service: Dillon Dun MES E. 06/26/2022 8:00 A M Medical Record Number: [id number] Patient Account Number: [id number] Date of Birth/Sex: Treating RN: [birth year] (57 y.o. [name] Primary Care [name]: [name] Other Clinician: [name] Referring Alveta Quintela: Treating Lindell Renfrew/Extender: [name] in  Treatment: 12 Encounter Discharge Information Items Discharge Condition: Stable Ambulatory Status: Ambulatory Discharge Destination: Home Transportation: Private Auto Accompanied By: None Schedule Follow-up Appointment: Yes Clinical Summary of Care: Electronic Signature(s) Signed: 06/26/2022 1:27:11 PM By: [name] EMT Entered By: [name] on 06/26/2022 13:27:10 [name] ([id number]) [id number] Page 2 of 2 -------------------------------------------------------------------------------- Vitals Details Patient Name: Date of Service: Dillon Dun MES E. 06/26/2022 8:00 A M Medical Record Number: [id number] Patient Account Number: [id number] Date of Birth/Sex: Treating RN: [birth year] (57 y.o. [name] Primary Care [name]: [name] Other Clinician: [name] Referring [name]: Treating [name]/Extender: [name] in Treatment: 12 Vital Signs Time Taken: 08:03 Capillary Blood Glucose (mg/dl): 87 Height (in): 70 Reference Range: 80 - 120 mg / dl Weight (lbs): 210 Body Mass Index (BMI): 30.1 Electronic Signature(s) Signed: 06/26/2022 12:51:55 PM By: [name] EMT Entered By: [name] on 06/26/2022 12:51:55

## 2022-06-29 ENCOUNTER — Encounter (HOSPITAL_BASED_OUTPATIENT_CLINIC_OR_DEPARTMENT_OTHER): Payer: BC Managed Care – PPO | Admitting: General Surgery

## 2022-06-30 ENCOUNTER — Encounter (HOSPITAL_BASED_OUTPATIENT_CLINIC_OR_DEPARTMENT_OTHER): Payer: BC Managed Care – PPO | Admitting: General Surgery

## 2022-06-30 DIAGNOSIS — N3041 Irradiation cystitis with hematuria: Secondary | ICD-10-CM | POA: Diagnosis not present

## 2022-06-30 LAB — GLUCOSE, CAPILLARY
Glucose-Capillary: 207 mg/dL — ABNORMAL HIGH (ref 70–99)
Glucose-Capillary: 178 mg/dL — ABNORMAL HIGH (ref 70–99)

## 2022-06-30 NOTE — Progress Notes (Addendum)
[name] ([id number]) [id number] Page 1 of 2 Visit Report for 06/30/2022 Arrival Information Details Patient Name: Date of Service: [name] MES E. 06/30/2022 8:00 A M Medical Record Number: [id number] Patient Account Number: [id number] Date of Birth/Sex: Treating RN: March 10, 1966 (57 y.o. [name] Primary Care Tekeshia Klahr: [name] Other Clinician: [name] Referring Emery Dupuy: Treating Unita Detamore/Extender: [name] in Treatment: 13 Visit Information History Since Last Visit All ordered tests and consults were completed: Yes Patient Arrived: Ambulatory Added or deleted any medications: No Arrival Time: 07:45 Any new allergies or adverse reactions: No Accompanied By: None Had a fall or experienced change in No Transfer Assistance: None activities of daily living that may affect Patient Identification Verified: Yes risk of falls: Secondary Verification Process Completed: Yes Signs or symptoms of abuse/neglect since last visito No Patient Requires Transmission-Based Precautions: No Hospitalized since last visit: No Patient Has Alerts: No Implantable device outside of the clinic excluding No cellular tissue based products placed in the center since last visit: Pain Present Now: No Electronic Signature(s) Signed: 06/30/2022 12:30:57 PM By: [name] EMT Entered By: [name] on 06/30/2022 12:30:57 -------------------------------------------------------------------------------- Encounter Discharge Information Details Patient Name: Date of Service: [name] MES E. 06/30/2022 8:00 A M Medical Record Number: [id number] Patient Account Number: [id number] Date of Birth/Sex: Treating RN: 12-Nov-1965 (57 y.o. [name] Primary Care Debie Ashline: [name] Other Clinician: [name] Referring Shravya Wickwire: Treating Colyn Miron/Extender: [name] in Treatment:  13 Encounter Discharge Information Items Discharge Condition: Stable Ambulatory Status: Ambulatory Discharge Destination: Home Transportation: Private Auto Accompanied By: None Schedule Follow-up Appointment: Yes Clinical Summary of Care: Electronic Signature(s) Signed: 06/30/2022 12:37:14 PM By: [name] EMT Entered By: [name] on 06/30/2022 12:37:14 [name] ([id number]) [id number] Page 2 of 2 -------------------------------------------------------------------------------- Vitals Details Patient Name: Date of Service: [name] MES E. 06/30/2022 8:00 A M Medical Record Number: [id number] Patient Account Number: [id number] Date of Birth/Sex: Treating RN: Jul 07, 1965 (57 y.o. [name] Primary Care Jillienne Egner: [name] Other Clinician: [name] Referring Kayler Rise: Treating Oretha Weismann/Extender: [name] in Treatment: 13 Vital Signs Time Taken: 08:02 Temperature (F): 98.6 Height (in): 70 Pulse (bpm): 92 Weight (lbs): 210 Respiratory Rate (breaths/min): 18 Body Mass Index (BMI): 30.1 Blood Pressure (mmHg): 165/79 Capillary Blood Glucose (mg/dl): 207 Reference Range: 80 - 120 mg / dl Electronic Signature(s) Signed: 06/30/2022 12:31:46 PM By: [name] EMT Entered By: [name] on 06/30/2022 12:31:46

## 2022-06-30 NOTE — Progress Notes (Signed)
LIMUEL, NIEBLAS ([id number]) [id number] Page 1 of 2 Visit Report for 06/30/2022 Problem List Details Patient Name: Date of Service: [name] MES E. 06/30/2022 8:00 A M Medical Record Number: [id number] Patient Account Number: [id number] Date of Birth/Sex: Treating RN: Sep 21, 1965 (57 y.o. 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 06/30/2022 Medical Record Number: [id number] Patient Account Number: [id number] Date of Birth/Sex: Treating RN: 02-29-1966 (57 y.o. [name] Primary Care Provider: [name] Other Clinician: [name] Referring Provider: Treating Provider/Extender: [name] in Treatment: 13 Diagnosis Coding ICD-10 Codes Code Description N30.41 Irradiation cystitis with hematuria ADAL, SERENO ([id number]) [id number] Page 2 of 2 R31.9 Hematuria, unspecified C61 Malignant neoplasm of prostate E10.3529 Type 1 diabetes mellitus with proliferative diabetic  retinopathy with traction retinal detachment involving the macula, unspecified eye Facility Procedures : CPT4 Code: 15726203 Description: G0277-(Facility Use Only) HBOT full body chamber, 56mn , ICD-10 Diagnosis Description 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 Hematuria, unspecified C61 Malignant neoplasm of prostate Modifier: Quantity: 4 Physician Procedures : CPT4 Code Description Modifier [id number] PHYS HYPERBARIC OXYGEN THERAPY ICD-10 Diagnosis Description 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 Hematuria, unspecified C61 Malignant neoplasm of prostate Quantity: 1 Electronic Signature(s) Signed: 06/30/2022 12:36:02 PM By: GValeria BatmanEMT Signed: 06/30/2022 1:39:54 PM By: CFredirick MaudlinMD FACS Entered By: GValeria Batmanon 06/30/2022 12:36:01

## 2022-06-30 NOTE — Progress Notes (Addendum)
[name], [name] ([id number]) [id number] Page 1 of 2 Visit Report for 06/30/2022 HBO Details Patient Name: Date of Service: Dillon Dun MES E. 06/30/2022 8:00 A M Medical Record Number: [id number] Patient Account Number: [id number] Date of Birth/Sex: Treating RN: [birth year] (57 y.o. [name] Primary Care Owin Vignola: [name] Other Clinician: [name] Referring Cameo Schmiesing: Treating Aarav Burgett/Extender: [name] in Treatment: 13 HBO Treatment Course Details Treatment Course Number: 1 Ordering Julyssa Kyer: [name] T Treatments Ordered: otal 60 HBO Treatment 04/09/2022 Start Date: HBO Indication: Late Effect of Radiation HBO Treatment 06/30/2022 End Date: HBO Discharge Treatment Series Incomplete; Patient Choice- Clinical Outcome: Condition Secondary to HBO HBO Treatment Details Treatment Number: 49 Patient Type: Outpatient Chamber Type: Monoplace Chamber Serial #: [id number] Treatment Protocol: 2.0 ATA with 90 minutes oxygen, and no air breaks Treatment Details Compression Rate Down: 2.0 psi / minute De-Compression Rate Up: 2.0 psi / minute Air breaks and breathing Decompress Decompress Compress Tx Pressure Begins Reached periods Begins Ends (leave unused spaces blank) Chamber Pressure (ATA 1 2 ------2 1 ) Clock Time (24 hr) 08:08 08:15 - - - - - - 09:46 09:54 Treatment Length: 106 (minutes) Treatment Segments: 4 Vital Signs Capillary Blood Glucose Reference Range: 80 - 120 mg / dl HBO Diabetic Blood Glucose Intervention Range: <131 mg/dl or >249 mg/dl Time Vitals Blood Respiratory Capillary Blood Glucose Pulse Action Type: Pulse: Temperature: Taken: Pressure: Rate: Glucose (mg/dl): Meter #: Oximetry (%) Taken: Pre 08:02 165/79 92 18 98.6 207 Post 09:58 173/70 92 20 97.2 178 Treatment Response Treatment Toleration: Well Treatment Completion Status: Treatment Completed without Adverse  Event Treatment Notes 07/02/2022. The patient called on 06/30/2022. He left message stating that he had spoke with Dr. Alinda Money, and that he would not be coming back for anymore HBO treatments. Physician HBO Attestation: I certify that I supervised this HBO treatment in accordance with Medicare guidelines. A trained emergency response team is readily available per Yes hospital policies and procedures. Continue HBOT as ordered. Yes Electronic Signature(s) Signed: 07/03/2022 7:38:04 AM By: [name] MD FACS Previous Signature: 07/02/2022 3:02:27 PM Version By: [name] EMT Previous Signature: 06/30/2022 1:40:48 PM Version By: [name] MD FACS Previous Signature: 06/30/2022 12:35:40 PM Version By: Roderic Ovens ([id number]) [id number] Page 2 of 2 Previous Signature: 06/30/2022 12:35:40 PM Version By: [name] EMT Entered By: [name] on 07/03/2022 07:38:04 -------------------------------------------------------------------------------- HBO Safety Checklist Details Patient Name: Date of Service: Dillon Dun MES E. 06/30/2022 8:00 A M Medical Record Number: [id number] Patient Account Number: [id number] Date of Birth/Sex: Treating RN: [birth year] (57 y.o. [name] Primary Care Ukiah Trawick: [name] Other Clinician: [name] Referring Wynne Jury: Treating Fumiko Cham/Extender: [name] in Treatment: 13 [name] Safety Checklist Consent Form Signed Patient voided / foley secured and emptied When did you last eato 0700 Last dose of injectable or oral agent 0715 Ostomy pouch emptied and vented if applicable NA All implantable devices assessed, documented and approved NA Intravenous access site secured and place NA Valuables secured Linens and cotton and cotton/polyester blend (less than 51% polyester) Personal oil-based products / skin lotions / body lotions  removed Wigs or hairpieces removed NA Smoking or tobacco materials removed Books / newspapers / magazines / loose paper removed Cologne, aftershave, perfume and deodorant removed Jewelry removed (may wrap wedding band) Make-up removed NA Hair care products removed Battery operated devices (external) removed Heating patches and chemical warmers removed  Titanium eyewear removed NA Plastic frames Nail polish cured greater than 10 hours NA Casting material cured greater than 10 hours NA Hearing aids removed NA Loose dentures or partials removed NA Prosthetics have been removed NA Patient demonstrates correct use of air break device (if applicable) Patient concerns have been addressed Patient grounding bracelet on and cord attached to chamber Specifics for Inpatients (complete in addition to above) Medication sheet sent with patient NA Intravenous medications needed or due during therapy sent with patient NA Drainage tubes (Mooreg. nasogastric tube or chest tube secured and vented) NA Endotracheal or Tracheotomy tube secured NA Cuff deflated of air and inflated with saline NA Airway suctioned NA Notes The safety checklist was done before the treatment was started. The patient stated that he had toast before coming in for treatment today. Electronic Signature(s) Signed: 06/30/2022 12:33:34 PM By: [name] EMT Entered By: [name] on 06/30/2022 12:33:33

## 2022-07-01 ENCOUNTER — Encounter (HOSPITAL_BASED_OUTPATIENT_CLINIC_OR_DEPARTMENT_OTHER): Payer: BC Managed Care – PPO | Admitting: General Surgery

## 2022-07-02 ENCOUNTER — Encounter (HOSPITAL_BASED_OUTPATIENT_CLINIC_OR_DEPARTMENT_OTHER): Payer: BC Managed Care – PPO | Admitting: General Surgery

## 2022-07-03 ENCOUNTER — Encounter (HOSPITAL_BASED_OUTPATIENT_CLINIC_OR_DEPARTMENT_OTHER): Payer: BC Managed Care – PPO | Admitting: General Surgery

## 2022-07-07 ENCOUNTER — Encounter (HOSPITAL_BASED_OUTPATIENT_CLINIC_OR_DEPARTMENT_OTHER): Payer: Self-pay

## 2022-07-07 ENCOUNTER — Other Ambulatory Visit: Payer: Self-pay

## 2022-07-07 ENCOUNTER — Emergency Department (HOSPITAL_BASED_OUTPATIENT_CLINIC_OR_DEPARTMENT_OTHER): Payer: BC Managed Care – PPO

## 2022-07-07 ENCOUNTER — Inpatient Hospital Stay (HOSPITAL_BASED_OUTPATIENT_CLINIC_OR_DEPARTMENT_OTHER)
Admission: EM | Admit: 2022-07-07 | Discharge: 2022-07-16 | DRG: 699 | Disposition: A | Discharge status: Home Health | Payer: BC Managed Care – PPO | Attending: Internal Medicine | Admitting: Internal Medicine

## 2022-07-07 ENCOUNTER — Emergency Department (HOSPITAL_COMMUNITY): Payer: BC Managed Care – PPO

## 2022-07-07 DIAGNOSIS — R319 Hematuria, unspecified: Secondary | ICD-10-CM | POA: Diagnosis present

## 2022-07-07 DIAGNOSIS — E876 Hypokalemia: Secondary | ICD-10-CM | POA: Diagnosis not present

## 2022-07-07 DIAGNOSIS — J45909 Unspecified asthma, uncomplicated: Secondary | ICD-10-CM | POA: Diagnosis present

## 2022-07-07 DIAGNOSIS — N179 Acute kidney failure, unspecified: Secondary | ICD-10-CM | POA: Diagnosis not present

## 2022-07-07 DIAGNOSIS — D649 Anemia, unspecified: Secondary | ICD-10-CM | POA: Diagnosis not present

## 2022-07-07 DIAGNOSIS — E785 Hyperlipidemia, unspecified: Secondary | ICD-10-CM | POA: Diagnosis present

## 2022-07-07 DIAGNOSIS — Z794 Long term (current) use of insulin: Secondary | ICD-10-CM

## 2022-07-07 DIAGNOSIS — D75839 Thrombocytosis, unspecified: Secondary | ICD-10-CM

## 2022-07-07 DIAGNOSIS — N3040 Irradiation cystitis without hematuria: Secondary | ICD-10-CM

## 2022-07-07 DIAGNOSIS — E669 Obesity, unspecified: Secondary | ICD-10-CM | POA: Diagnosis present

## 2022-07-07 DIAGNOSIS — R509 Fever, unspecified: Secondary | ICD-10-CM | POA: Diagnosis not present

## 2022-07-07 DIAGNOSIS — Y842 Radiological procedure and radiotherapy as the cause of abnormal reaction of the patient, or of later complication, without mention of misadventure at the time of the procedure: Secondary | ICD-10-CM | POA: Diagnosis present

## 2022-07-07 DIAGNOSIS — E871 Hypo-osmolality and hyponatremia: Secondary | ICD-10-CM | POA: Diagnosis not present

## 2022-07-07 DIAGNOSIS — E1065 Type 1 diabetes mellitus with hyperglycemia: Secondary | ICD-10-CM

## 2022-07-07 DIAGNOSIS — Z9079 Acquired absence of other genital organ(s): Secondary | ICD-10-CM

## 2022-07-07 DIAGNOSIS — Z6830 Body mass index (BMI) 30.0-30.9, adult: Secondary | ICD-10-CM

## 2022-07-07 DIAGNOSIS — I10 Essential (primary) hypertension: Secondary | ICD-10-CM | POA: Diagnosis present

## 2022-07-07 DIAGNOSIS — E860 Dehydration: Secondary | ICD-10-CM | POA: Diagnosis present

## 2022-07-07 DIAGNOSIS — B952 Enterococcus as the cause of diseases classified elsewhere: Secondary | ICD-10-CM | POA: Diagnosis not present

## 2022-07-07 DIAGNOSIS — R310 Gross hematuria: Secondary | ICD-10-CM | POA: Diagnosis present

## 2022-07-07 DIAGNOSIS — E1165 Type 2 diabetes mellitus with hyperglycemia: Secondary | ICD-10-CM | POA: Diagnosis not present

## 2022-07-07 DIAGNOSIS — C7951 Secondary malignant neoplasm of bone: Secondary | ICD-10-CM | POA: Diagnosis present

## 2022-07-07 DIAGNOSIS — N3041 Irradiation cystitis with hematuria: Principal | ICD-10-CM | POA: Diagnosis present

## 2022-07-07 DIAGNOSIS — Z923 Personal history of irradiation: Secondary | ICD-10-CM

## 2022-07-07 DIAGNOSIS — Y92009 Unspecified place in unspecified non-institutional (private) residence as the place of occurrence of the external cause: Secondary | ICD-10-CM

## 2022-07-07 DIAGNOSIS — T50995A Adverse effect of other drugs, medicaments and biological substances, initial encounter: Secondary | ICD-10-CM | POA: Diagnosis present

## 2022-07-07 DIAGNOSIS — E109 Type 1 diabetes mellitus without complications: Secondary | ICD-10-CM | POA: Diagnosis present

## 2022-07-07 DIAGNOSIS — R600 Localized edema: Secondary | ICD-10-CM | POA: Diagnosis not present

## 2022-07-07 DIAGNOSIS — C61 Malignant neoplasm of prostate: Secondary | ICD-10-CM | POA: Diagnosis present

## 2022-07-07 DIAGNOSIS — R32 Unspecified urinary incontinence: Secondary | ICD-10-CM | POA: Diagnosis present

## 2022-07-07 DIAGNOSIS — Z79899 Other long term (current) drug therapy: Secondary | ICD-10-CM

## 2022-07-07 DIAGNOSIS — D500 Iron deficiency anemia secondary to blood loss (chronic): Secondary | ICD-10-CM | POA: Diagnosis present

## 2022-07-07 DIAGNOSIS — B9561 Methicillin susceptible Staphylococcus aureus infection as the cause of diseases classified elsewhere: Secondary | ICD-10-CM | POA: Diagnosis not present

## 2022-07-07 DIAGNOSIS — Z87891 Personal history of nicotine dependence: Secondary | ICD-10-CM

## 2022-07-07 DIAGNOSIS — D630 Anemia in neoplastic disease: Secondary | ICD-10-CM | POA: Diagnosis present

## 2022-07-07 DIAGNOSIS — R7881 Bacteremia: Secondary | ICD-10-CM

## 2022-07-07 LAB — COMPREHENSIVE METABOLIC PANEL
ALT: 36 U/L (ref 0–44)
AST: 26 U/L (ref 15–41)
Albumin: 3.3 g/dL — ABNORMAL LOW (ref 3.5–5.0)
Alkaline Phosphatase: 206 U/L — ABNORMAL HIGH (ref 38–126)
Anion gap: 13 (ref 5–15)
BUN: 47 mg/dL — ABNORMAL HIGH (ref 6–20)
CO2: 22 mmol/L (ref 22–32)
Calcium: 8.9 mg/dL (ref 8.9–10.3)
Chloride: 92 mmol/L — ABNORMAL LOW (ref 98–111)
Creatinine, Ser: 1.73 mg/dL — ABNORMAL HIGH (ref 0.61–1.24)
GFR, Estimated: 46 mL/min — ABNORMAL LOW (ref >60)
Glucose, Bld: 277 mg/dL — ABNORMAL HIGH (ref 70–99)
Potassium: 3.4 mmol/L — ABNORMAL LOW (ref 3.5–5.1)
Sodium: 127 mmol/L — ABNORMAL LOW (ref 135–145)
Total Bilirubin: 0.3 mg/dL (ref 0.3–1.2)
Total Protein: 6.6 g/dL (ref 6.5–8.1)

## 2022-07-07 LAB — CBC WITH DIFFERENTIAL/PLATELET
Abs Immature Granulocytes: 0.08 10*3/uL — ABNORMAL HIGH (ref 0.00–0.07)
Basophils Absolute: 0 10*3/uL (ref 0.0–0.1)
Basophils Relative: 0 %
Eosinophils Absolute: 0 10*3/uL (ref 0.0–0.5)
Eosinophils Relative: 1 %
HCT: 19.1 % — ABNORMAL LOW (ref 39.0–52.0)
Hemoglobin: 6.3 g/dL — CL (ref 13.0–17.0)
Immature Granulocytes: 1 %
Lymphocytes Relative: 3 %
Lymphs Abs: 0.3 10*3/uL — ABNORMAL LOW (ref 0.7–4.0)
MCH: 29.6 pg (ref 26.0–34.0)
MCHC: 33 g/dL (ref 30.0–36.0)
MCV: 89.7 fL (ref 80.0–100.0)
Monocytes Absolute: 1.4 10*3/uL — ABNORMAL HIGH (ref 0.1–1.0)
Monocytes Relative: 16 %
Neutro Abs: 6.7 10*3/uL (ref 1.7–7.7)
Neutrophils Relative %: 79 %
Platelets: 396 10*3/uL (ref 150–400)
RBC: 2.13 MIL/uL — ABNORMAL LOW (ref 4.22–5.81)
RDW: 13.3 % (ref 11.5–15.5)
WBC: 8.5 10*3/uL (ref 4.0–10.5)
nRBC: 0 % (ref 0.0–0.2)

## 2022-07-07 LAB — OCCULT BLOOD X 1 CARD TO LAB, STOOL: Fecal Occult Bld: NEGATIVE

## 2022-07-07 MED ORDER — SODIUM CHLORIDE 0.9% IV SOLUTION: Freq: Once | INTRAVENOUS | Status: AC

## 2022-07-07 MED ORDER — POTASSIUM CHLORIDE CRYS ER 20 MEQ PO TBCR
40.0000 meq | EXTENDED_RELEASE_TABLET | Freq: Once | ORAL | Status: AC
  Administered 2022-07-07: 40 meq via ORAL
  Filled 2022-07-07: qty 2

## 2022-07-07 MED ORDER — SODIUM CHLORIDE 0.9 % IV BOLUS
500.0000 mL | Freq: Once | INTRAVENOUS | Status: AC
  Administered 2022-07-07: 500 mL via INTRAVENOUS

## 2022-07-07 NOTE — Subjective & Objective (Signed)
Pt was sent to ER with Hg of 6.9 on Thursday has been fatigued lightheaded SOB  Post op for radiation cytitis Now pink urine but had blood clots before  No N/V no abd pain

## 2022-07-07 NOTE — ED Notes (Signed)
Pt refuses in and out cath

## 2022-07-07 NOTE — ED Triage Notes (Signed)
POV from home, sent here for low hemoglobin of 6.9 that was taken last Thursday. Pt weak and pale. 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 Pt alert and oriented x 4, BIB wheelchair.

## 2022-07-07 NOTE — ED Notes (Signed)
Bladder scanned pt, pt retains 201 mL .

## 2022-07-07 NOTE — ED Provider Notes (Signed)
[facility] Provider Note   CSN: [id number] Arrival date & time: 07/07/22  1804     History  Chief Complaint  Patient presents with   Abnormal Lab    [name] is a 57 y.o. male.   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  He states that his hemoglobin was 6.9 last Thursday.  He is pale and weak and fatigued.  He endorses lightheadedness and shortness of breath.  He still passes active clots into his diaper and is leaking urine into his diaper.  States the bleeding is more controlled with pink-red urine only.  He denies any significant abdominal pain.  No nausea or vomiting.  He denies any melena or hematochezia.  He denies any syncope.  Home Medications Prior to Admission medications   Medication Sig Start Date End Date Taking? Authorizing Provider  albuterol (VENTOLIN HFA) 108 (90 Base) MCG/ACT inhaler Inhale 2 puffs into the lungs every 6 (six) hours as needed for wheezing or shortness of breath. 06/03/22   [provider]  ALPRAZolam Duanne Moron) 0.25 MG tablet Take 0.25 mg by mouth 3 (three) times daily as needed for anxiety. 03/17/22   [provider]  amLODipine (NORVASC) 5 MG tablet Take 5 mg by mouth daily. 06/23/21   [provider]  Continuous Blood Gluc Sensor (FREESTYLE LIBRE 2 SENSOR) MISC USE TO MONITOR CBG EVERY 14 DAYS AS DIRECTED 06/11/21   [provider]  Insulin Pen Needle (B-D UF III MINI PEN NEEDLES) 31G X 5 MM MISC USE TO INJECT INSULIN 4 TIMES DAILY E10.9 01/22/16   [provider]  Insulin Syringe-Needle U-100 (B-D INS SYRINGE 0.5CC/31GX5/16) 31G X 5/16" 0.5 ML MISC use 1 syringe as directed (does 5 injections a day 02/04/10   [provider]  LYUMJEV TEMPO PEN 100  UNIT/ML SOPN Inject into the skin. 02/05/22   [provider]  oxyCODONE (ROXICODONE) 5 MG immediate release tablet Take 1 tablet (5 mg total) by mouth every 8 (eight) hours as needed. 06/22/22 06/22/23  Raynelle Bring, MD  ramipril (ALTACE) 10 MG capsule Take 10 mg by mouth daily.    [provider]  rosuvastatin (CRESTOR) 5 MG tablet Take 5 mg by mouth 2 (two) times a week.    [provider]  TRESIBA FLEXTOUCH 200 UNIT/ML SOPN Inject 40 Units into the skin every morning. 07/25/15   [provider]  XTANDI 40 MG tablet Take 160 mg by mouth at bedtime. 03/04/22   [provider]      Allergies    Patient has no known allergies.    Review of Systems   Review of Systems  All other systems reviewed and are negative.   Physical Exam Updated Vital Signs BP (!) 140/61   Pulse 89   Temp 98.2 F (36.8 C) (Oral)   Resp 16   Ht '5\' 10"'$  (1.778 m)   Wt 95.2 kg   SpO2 100%   BMI 30.11 kg/m  Physical Exam Vitals and nursing note reviewed.  Constitutional:      General: He is not in acute distress. HENT:     Head: Normocephalic and atraumatic.  Eyes:     Conjunctiva/sclera: Conjunctivae normal.     Pupils: Pupils are equal, round, and reactive to light.  Cardiovascular:  Rate and Rhythm: Normal rate and regular rhythm.  Pulmonary:     Effort: Pulmonary effort is normal. No respiratory distress.  Abdominal:     General: There is no distension.     Tenderness: There is abdominal tenderness. There is no guarding.     Comments: Mild suprapubic tenderness, no rebound or guarding  Musculoskeletal:        General: No deformity or signs of injury.     Cervical back: Neck supple.  Skin:    Coloration: Skin is pale.     Findings: No lesion or rash.  Neurological:     General: No focal deficit present.     Mental Status: He is alert. Mental status is at baseline.     ED Results / Procedures / Treatments   Labs (all labs ordered are listed, but  only abnormal results are displayed) Labs Reviewed  CBC WITH DIFFERENTIAL/PLATELET - Abnormal; Notable for the following components:      Result Value   RBC 2.13 (*)    Hemoglobin 6.3 (*)    HCT 19.1 (*)    Lymphs Abs 0.3 (*)    Monocytes Absolute 1.4 (*)    Abs Immature Granulocytes 0.08 (*)    All other components within normal limits  COMPREHENSIVE METABOLIC PANEL - Abnormal; Notable for the following components:   Sodium 127 (*)    Potassium 3.4 (*)    Chloride 92 (*)    Glucose, Bld 277 (*)    BUN 47 (*)    Creatinine, Ser 1.73 (*)    Albumin 3.3 (*)    Alkaline Phosphatase 206 (*)    GFR, Estimated 46 (*)    All other components within normal limits  URINALYSIS, ROUTINE W REFLEX MICROSCOPIC  OCCULT BLOOD X 1 CARD TO LAB, STOOL    EKG None  Radiology No results found.  Procedures Procedures    Medications Ordered in ED Medications - No data to display  ED Course/ Medical Decision Making/ A&P Clinical Course as of 07/07/22 2035  Tue Jul 07, 2022  2025 Hemoglobin(!!): 6.3 [JL]    Clinical Course User Index [JL] Regan Lemming, MD                             Medical Decision Making Amount and/or Complexity of Data Reviewed Labs: ordered. Decision-making details documented in ED Course.     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  He states that his hemoglobin was 6.9 last Thursday.  He is pale and weak and fatigued.  He endorses lightheadedness and shortness of breath.  He still passes active clots into his diaper and is leaking urine into his diaper.  States the bleeding is more controlled with pink-red urine only.  He denies any significant abdominal pain.  No nausea or vomiting.  He denies any melena or hematochezia.  He denies any syncope.  Arrival, the patient was  vitally stable.  Hemoglobin from triage 6.3.  Patient presenting with symptoms of anemia.  No symptoms of GI bleed.  Likely blood loss due to radiation cystitis.  Bladder scan was performed which revealed a urine volume of 60 mL in the bladder.  Low concern for urinary obstruction at this time.  I advised the patient that we cannot perform nonemergent blood transfusion at  the [facility] emergency department.  The [facility] emergency department was contacted for blood transfusion given the patient's symptomatic anemia and Dr. Pearline Cables accepted the patient in ER to ER transfer for further evaluation.  Disposition pending reassessment following blood transfusion and remainder of laboratory evaluation.   Final Clinical Impression(s) / ED Diagnoses Final diagnoses:  Symptomatic anemia    Rx / DC Orders ED Discharge Orders     None         Regan Lemming, MD 07/07/22 2035

## 2022-07-07 NOTE — H&P (Signed)
[name] TKP:[id number] DOB: 1965-09-04 DOA: 07/07/2022     PCP: [name], MD   Outpatient Specialists:     Urology Dr.  Alinda Money  Patient arrived to ER on 07/07/22 at [facility] Referred by Attending Jeanell Sparrow, DO   Patient coming from:    home Lives  With family    Chief Complaint:   Chief Complaint  Patient presents with   Abnormal Lab    HPI: [name] is a 57 y.o. male with medical history significant of Prostate Cancer  Anemia, DM type 1,   Presented with  blood clot in urine Pt was sent to ER with Hg of 6.9 on Thursday has been fatigued lightheaded SOB  Post op for radiation cytitis Now pink urine but had blood clots before  No N/V no abd pain      Regarding pertinent Chronic problems:      HTN on NOrvasc     DM 1 -  Lab Results  Component Value Date   HGBA1C 7.7 (H) 03/21/2022   on insulin,       Chronic anemia - baseline hg Hemoglobin & Hematocrit  Recent Labs    06/04/22 2000 06/22/22 1520 07/07/22 1917  HGB 9.0* 7.5* 6.3*    While in ER: Clinical Course as of 07/07/22 2356  Tue Jul 07, 2022  2025 Hemoglobin(!!): 6.3 [JL]  2157 Creatinine(!): 1.73 Prior 0.94 1 month ago [SG]  2157 Sodium(!): 127 136 one month ago  [SG]  2158 Hemoglobin(!!): 6.3 9 one month ago, 7.5 three weeks ago [SG]    Clinical Course User Index [JL] Regan Lemming, MD [SG] Jeanell Sparrow, DO     US renal Focal protrusion of the right bladder wall may be related to underdistention. A bladder lesion is not entirely excluded clinical correlation is recommended.  Following Medications were ordered in ER: Medications  0.9 %  sodium chloride infusion (Manually program via Guardrails IV Fluids) (has no administration in time range)  sodium chloride 0.9 % bolus 500 mL (500 mLs Intravenous New Bag/Given 07/07/22 2339)  potassium chloride SA (KLOR-CON M) CR tablet 40 mEq (40 mEq Oral Given 07/07/22 2339)       ED Triage Vitals  Enc Vitals Group     BP  07/07/22 1915 137/62     Pulse Rate 07/07/22 1915 91     Resp 07/07/22 1915 (!) 22     Temp 07/07/22 1915 98.2 F (36.8 C)     Temp Source 07/07/22 1915 Oral     SpO2 07/07/22 1915 100 %     Weight 07/07/22 1915 209 lb 14.1 oz (95.2 kg)     Height 07/07/22 1915 '5\' 10"'$  (1.778 m)     Head Circumference --      Peak Flow --      Pain Score 07/07/22 1914 2     Pain Loc --      Pain Edu? --      Excl. in [place]? --   TMAX(24)@     _________________________________________ Significant initial  Findings: Abnormal Labs Reviewed  CBC WITH DIFFERENTIAL/PLATELET - Abnormal; Notable for the following components:      Result Value   RBC 2.13 (*)    Hemoglobin 6.3 (*)    HCT 19.1 (*)    Lymphs Abs 0.3 (*)    Monocytes Absolute 1.4 (*)    Abs Immature Granulocytes 0.08 (*)    All other components within normal limits  COMPREHENSIVE  METABOLIC PANEL - Abnormal; Notable for the following components:   Sodium 127 (*)    Potassium 3.4 (*)    Chloride 92 (*)    Glucose, Bld 277 (*)    BUN 47 (*)    Creatinine, Ser 1.73 (*)    Albumin 3.3 (*)    Alkaline Phosphatase 206 (*)    GFR, Estimated 46 (*)    All other components within normal limits     ECG: Ordered Personally reviewed and interpreted by me showing: HR : 87 Rhythm:  NSR,   no evidence of ischemic changes QTC 450    The recent clinical data is shown below. Vitals:   07/07/22 2200 07/07/22 2215 07/07/22 2230 07/07/22 2315  BP: (!) 137/100 (!) 155/80 (!) 151/81 (!) 138/50  Pulse: (!) 101 98 (!) 107 93  Resp: 20 19 (!) 24 19  Temp:    98.2 F (36.8 C)  TempSrc:      SpO2: 100% 100% 100% 98%  Weight:      Height:          WBC     Component Value Date/Time   WBC 8.5 07/07/2022 1917   LYMPHSABS 0.3 (L) 07/07/2022 1917   MONOABS 1.4 (H) 07/07/2022 1917   EOSABS 0.0 07/07/2022 1917   BASOSABS 0.0 07/07/2022 1917     UA   no evidence of UTI     Urine analysis:    Component Value Date/Time   COLORURINE RED (A)  05/03/2022 0706   APPEARANCEUR CLOUDY (A) 05/03/2022 0706   LABSPEC <1.005 (L) 05/03/2022 0706   PHURINE 7.0 05/03/2022 0706   GLUCOSEU 100 (A) 05/03/2022 0706   HGBUR LARGE (A) 05/03/2022 0706   BILIRUBINUR NEGATIVE 05/03/2022 0706   KETONESUR NEGATIVE 05/03/2022 0706   PROTEINUR >300 (A) 05/03/2022 0706   NITRITE POSITIVE (A) 05/03/2022 0706   LEUKOCYTESUR SMALL (A) 05/03/2022 0706    Results for orders placed or performed during the hospital encounter of 05/03/22  Urine Culture     Status: None   Collection Time: 05/03/22  7:07 AM   Specimen: Urine, Catheterized  Result Value Ref Range Status   Specimen Description   Final    URINE, CATHETERIZED Performed at [facility], [place] [address] [place], [place] [address]    Special Requests   Final    NONE Performed at [facility], [place] [address] [place], [place] [address]    Culture   Final    NO GROWTH Performed at [facility], [place] [address], [place], [place] [address]    Report Status 05/04/2022 FINAL  Final     _______________________________________________ Hospitalist was called for admission for   Symptomatic anemia  AKI (acute kidney injury   Hyponatremia  Radiation cystitis  Hypokalemia    The following Work up has been ordered so far:  Orders Placed This Encounter  Procedures   Critical Care   US Renal   CBC with Differential   Comprehensive metabolic panel   Urinalysis, Routine w reflex microscopic   Occult blood card to lab, stool   Brain natriuretic peptide   Bladder scan   Secure IV for POV transfer   Informed Consent Details: Physician/Practitioner Attestation; Transcribe to consent form and obtain patient signature   Consult to hospitalist   Prepare RBC (crossmatch)   Type and screen [place] initial  Findings:  labs showing:    Recent Labs  Lab 07/07/22 1917  NA 127*  K 3.4*  CO2 22  GLUCOSE  277*  BUN 47*  CREATININE 1.73*  CALCIUM 8.9    Cr     Up from baseline see below Lab Results  Component Value Date   CREATININE 1.73 (H) 07/07/2022   CREATININE 0.94 06/04/2022   CREATININE 1.17 05/20/2022    Recent Labs  Lab 07/07/22 1917  AST 26  ALT 36  ALKPHOS 206*  BILITOT 0.3  PROT 6.6  ALBUMIN 3.3*   Lab Results  Component Value Date   CALCIUM 8.9 07/07/2022    Plt: Lab Results  Component Value Date   PLT 396 07/07/2022    Recent Labs  Lab 07/07/22 1917  WBC 8.5  NEUTROABS 6.7  HGB 6.3*  HCT 19.1*  MCV 89.7  PLT 396    HG/HCT Down from baseline see below    Component Value Date/Time   HGB 6.3 (LL) 07/07/2022 1917   HCT 19.1 (L) 07/07/2022 1917   MCV 89.7 07/07/2022 1917      DM  labs:  HbA1C: Recent Labs    03/21/22 0432  HGBA1C 7.7*       CBG (last 3)  No results for input(s): "GLUCAP" in the last 72 hours.        Cultures:    Component Value Date/Time   SDES  05/03/2022 0707    URINE, CATHETERIZED Performed at [facility] [address] [place], [place] [address]    SPECREQUEST  05/03/2022 0932    NONE Performed at [facility], [place] [address], [place], [place] [address]    CULT  05/03/2022 2202    NO GROWTH Performed at [facility], [place] [address] [place], [place] [address]    REPTSTATUS 05/04/2022 FINAL 05/03/2022 6237     Radiological Exams on Admission: US Renal  Result Date: 07/07/2022 CLINICAL DATA:  Acute renal insufficiency. EXAM: RENAL / URINARY TRACT ULTRASOUND COMPLETE COMPARISON:  CT abdomen pelvis dated 03/21/2022. FINDINGS: Right Kidney: Renal measurements: 11.4 x 6.1 x 6.0 cm = volume: 216 mL. Normal echogenicity. No hydronephrosis or shadowing stone. Left Kidney: Renal measurements: 12.7 x 6.3 x 6.9 cm = volume: 290 mL. Normal echogenicity. No hydronephrosis or shadowing stone. Bladder: There is slight irregularity of the bladder wall which may represent  trabeculation related to chronic bladder outlet obstruction. Focal area of nodular protrusion from the right lateral bladder wall, likely related to underdistention. A bladder lesion is not excluded. Clinical correlation and further evaluation with cystoscopy, if clinically indicated, recommended. Other: The prostate gland is enlarged measuring 6.0 x 3.7 x 3.7 cm. There is median lobe hypertrophy indenting the base of the bladder. IMPRESSION: 1. Unremarkable kidneys. 2. Focal protrusion of the right bladder wall may be related to underdistention. A bladder lesion is not entirely excluded clinical correlation is recommended. 3. Enlarged prostate gland. Electronically Signed   By: Anner Crete M.D.   On: 07/07/2022 22:37   _______________________________________________________________________________________________________ Latest  Blood pressure (!) 138/50, pulse 93, temperature 98.2 F (36.8 C), resp. rate 19, height '5\' 10"'$  (1.778 m), weight 95.2 kg, SpO2 98 %.   Vitals  labs and radiology finding personally reviewed  Review of Systems:    Pertinent positives include:   fatigue,weight loss  abdominal pain loss of appetite, blood in urine Constitutional:  No weight loss, night sweats, Fevers, chills,  HEENT:  No headaches, Difficulty swallowing,Tooth/dental problems,Sore throat,  No sneezing, itching, ear ache, nasal congestion,  post nasal drip,  Cardio-vascular:  No chest pain, Orthopnea, PND, anasarca, dizziness, palpitations.no Bilateral lower extremity swelling  GI:  No heartburn, indigestion, , nausea, vomiting, diarrhea, change in bowel habits,  melena, blood in stool, hematemesis Resp:  no shortness of breath at rest. No dyspnea on exertion, No excess mucus, no productive cough, No non-productive cough, No coughing up of blood.No change in color of mucus.No wheezing. Skin:  no rash or lesions. No jaundice GU:  no dysuria, change in color of urine, no urgency or frequency. No  straining to urinate.  No flank pain.  Musculoskeletal:  No joint pain or no joint swelling. No decreased range of motion. No back pain.  Psych:  No change in mood or affect. No depression or anxiety. No memory loss.  Neuro: no localizing neurological complaints, no tingling, no weakness, no double vision, no gait abnormality, no slurred speech, no confusion  All systems reviewed and apart from [place] all are negative _______________________________________________________________________________________________ Past Medical History:   Past Medical History:  Diagnosis Date   Asthma    Cancer ([place])    prostate cancer    Diabetes mellitus without complication ([place])    History of bronchitis    Hypertension    Prostate cancer ([place])    Wears glasses      Past Surgical History:  Procedure Laterality Date   CYSTOSCOPY WITH FULGERATION N/A 03/21/2022   Procedure: CYSTOSCOPY WITH FULGERATION;  Surgeon: Janith Lima, MD;  Location: [facility];  Service: Urology;  Laterality: N/A;   CYSTOSCOPY WITH FULGERATION N/A 06/22/2022   Procedure: CYSTOSCOPY WITH FULGERATION;  Surgeon: Raynelle Bring, MD;  Location: [facility];  Service: Urology;  Laterality: N/A;  30 MINS FOR CASE   EYE SURGERY     laser eye surgery bilat    left groin hernia      LYMPHADENECTOMY Bilateral 09/02/2015   Procedure: BILATERAL LYMPHADENECTOMY;  Surgeon: Raynelle Bring, MD;  Location: [facility];  Service: Urology;  Laterality: Bilateral;   ROBOT ASSISTED LAPAROSCOPIC RADICAL PROSTATECTOMY N/A 09/02/2015   Procedure: XI ROBOTIC ASSISTED LAPAROSCOPIC RADICAL PROSTATECTOMY LEVEL 2;  Surgeon: Raynelle Bring, MD;  Location: [facility];  Service: Urology;  Laterality: N/A;   schwannoma     removed approx 16 to 18 years ago    TONSILLECTOMY      Social History:  Ambulatory   independently       reports that he quit smoking about 24 years ago. His smoking use included cigarettes. He has a 3.50 pack-year smoking history. He has never used  smokeless tobacco. He reports current alcohol use. He reports that he does not use drugs.    Family History:  Adopted _______________________________________________________________________________________ Allergies: No Known Allergies   Prior to Admission medications   Medication Sig Start Date End Date Taking? Authorizing Provider  albuterol (VENTOLIN HFA) 108 (90 Base) MCG/ACT inhaler Inhale 2 puffs into the lungs every 6 (six) hours as needed for wheezing or shortness of breath. 06/03/22   [provider]  ALPRAZolam Duanne Moron) 0.25 MG tablet Take 0.25 mg by mouth 3 (three) times daily as needed for anxiety. 03/17/22   [provider]  amLODipine (NORVASC) 5 MG tablet Take 5 mg by mouth daily. 06/23/21   [provider]  Continuous Blood Gluc Sensor (FREESTYLE LIBRE 2 SENSOR) MISC USE TO MONITOR CBG EVERY 14 DAYS AS DIRECTED 06/11/21   [provider]  Insulin Pen Needle (B-D UF III MINI PEN NEEDLES) 31G X 5 MM MISC USE TO INJECT  INSULIN 4 TIMES DAILY E10.9 01/22/16   [provider]  Insulin Syringe-Needle U-100 (B-D INS SYRINGE 0.5CC/31GX5/16) 31G X 5/16" 0.5 ML MISC use 1 syringe as directed (does 5 injections a day 02/04/10   [provider]  LYUMJEV TEMPO PEN 100 UNIT/ML SOPN Inject into the skin. 02/05/22   [provider]  oxyCODONE (ROXICODONE) 5 MG immediate release tablet Take 1 tablet (5 mg total) by mouth every 8 (eight) hours as needed. 06/22/22 06/22/23  Raynelle Bring, MD  ramipril (ALTACE) 10 MG capsule Take 10 mg by mouth daily.    [provider]  rosuvastatin (CRESTOR) 5 MG tablet Take 5 mg by mouth 2 (two) times a week.    [provider]  TRESIBA FLEXTOUCH 200 UNIT/ML SOPN Inject 40 Units into the skin every morning. 07/25/15   [provider]  XTANDI 40 MG tablet Take 160 mg by mouth at bedtime. 03/04/22   [provider]     ___________________________________________________________________________________________________ Physical Exam:    07/07/2022   11:15 PM 07/07/2022   10:30 PM 07/07/2022   10:15 PM  Vitals with BMI  Systolic 858 850 277  Diastolic 50 81 80  Pulse 93 107 98     1. General:  in No  Acute distress   Chronically ill   -appearing 2. Psychological: Alert and   Oriented 3. Head/ENT:    dry Mucous Membranes                          Head Non traumatic, neck supple                           Poor Dentition 4. SKIN:  decreased Skin turgor,  Skin clean Dry and intact no rash 5. Heart: Regular rate and rhythm no  Murmur, no Rub or gallop 6. Lungs: , no wheezes or crackles   7. Abdomen: Soft,  non-tender, Non distended   obese  bowel sounds present 8. Lower extremities: no clubbing, cyanosis, no  edema 9. Neurologically Grossly intact, moving all 4 extremities equally   10. MSK: Normal range of motion    Chart has been reviewed  ______________________________________________________________________________________________  Assessment/Plan  57 y.o. male with medical history significant of Prostate Cancer  Anemia, DM type 1,    Admitted for   Symptomatic anemia, AKI, Hyponatremia,  Radiation cystitis,   Hypokalemia    Present on Admission:  Symptomatic anemia  Type 1 diabetes mellitus ([place])  Asthma  Prostate cancer ([place])  Hyperlipidemia  Essential hypertension  Hematuria  Hyponatremia  Hypokalemia  AKI (acute kidney injury) ([place])     Type 1 diabetes mellitus ([place]) Order SSI and continue Tresiba at 40 unit   Asthma Mild non contributary  Prostate cancer ([place]) Would benefit from urology consult in AM  To let them know pt has been admitted  Hyperlipidemia Continue crestor 5 mg po qday  Symptomatic anemia Transfuse 1 unit and follow cbc, most likely cause for anemia is hematuria due to radiation cystitis Repeat cbc post transfusion  Essential hypertension Allow  permissive htn for tonight  Hematuria Now has improved Continue to monitor no evidence of obstruction   Hyponatremia In the setting of dehydration and bleeding  Obtain urine electrolytes Rehydrate and follow  Hypokalemia - will replace and repeat in AM,  check magnesium level and replace as needed   AKI (acute kidney injury) ([place]) -  evidence of acute  renal failure due to presence of following: Cr increased >0.3 from baseline   likely secondary to dehydration,        check FeNA       Rehydrate with IV fluids Renal US showed no urinary retention or obstruction           Other plan as per orders.  DVT prophylaxis:  SCD     Code Status:    Code Status: Prior FULL CODE as per patient   I had personally discussed CODE STATUS with patient and family     Family Communication:   Family   at  Bedside  plan of care was discussed with  Wife,   Disposition Plan:     To home once workup is complete and patient is stable   Following barriers for discharge:                            Electrolytes corrected                               Anemia corrected                                     Diabetes care coordinator           Consults called:   will need to let Urology know in AM that patient is being admitted  Admission status:  ED Disposition     ED Disposition  [place]: [place] [[id number]]  Level of Care: Telemetry [5]  Admit to tele based on following criteria: Other see comments  Comments: symptomatic anemia  May place patient in [facility] at [facility] or [facility] if equivalent level of care is available:: No  Covid Evaluation: Asymptomatic - no recent exposure (last 10 days) testing not required  Diagnosis: Symptomatic anemia [[id number]]  Admitting Physician: Toy Baker [[id number]]  Attending Physician: Toy Baker [[id number]]           Obs     Level of care     tele  For 12H        Dashiel Bergquist [place] 07/08/2022, 2:11 AM    [facility]     after 2 AM please page floor coverage PA If 7AM-7PM, please contact the day team taking care of the patient using [email or web address]   Patient was evaluated in the context of the global COVID-19 pandemic, which necessitated consideration that the patient might be at risk for infection with the SARS-CoV-2 virus that causes COVID-19. Institutional protocols and algorithms that pertain to the evaluation of patients at risk for COVID-19 are in a state of rapid change based on information released by regulatory bodies including the CDC and federal and state organizations. These policies and algorithms were followed during the patient's care.

## 2022-07-07 NOTE — ED Provider Notes (Signed)
  Provider Note MRN:  [id number]  Arrival date & time: 07/08/22    ED Course and Medical Decision Making  [facility] Armandina Gemma  See note from prior team for complete details, in brief:  57 year old male status post radical prostatectomy, history of radiation cystitis, acute blood loss anemia Patient was passing clots of blood in his urine and has transition to blood-tinged urine.  No hematemesis or melena, no BRBPR Reduced UOP, dribbling Worsening fatigue over last week or so, exertional dyspnea.   Clinical Course as of 07/08/22 0029  Tue Jul 07, 2022  2025 Hemoglobin(!!): 6.3 [JL]  2157 Creatinine(!): 1.73 Prior 0.94 1 month ago [SG]  2157 Sodium(!): 127 136 one month ago  [SG]  2158 Hemoglobin(!!): 6.3 9 one month ago, 7.5 three weeks ago [SG]    Clinical Course User Index [JL] Regan Lemming, MD [SG] Jeanell Sparrow, DO    Patient has hyponatremia, AKI, symptomatic anemia.  PRBC ordered. Also given 500 mL ivf given AKI  UOP is diminished, blood in his urine has reduced, no longer passing clots, now more so just pink tinged urine.   Recommendation for admission for the above.  Patient is agreeable.  Follows with Dr Alinda Money urology  Admitted to [facility] Dr Roel Cluck      .Critical Care  Performed by: Jeanell Sparrow, DO Authorized by: Jeanell Sparrow, DO   Critical care provider statement:    Critical care time (minutes):  30   Critical care time was exclusive of:  Separately billable procedures and treating other patients   Critical care was necessary to treat or prevent imminent or life-threatening deterioration of the following conditions:  Metabolic crisis and circulatory failure   Critical care was time spent personally by me on the following activities:  Development of treatment plan with patient or surrogate, discussions with consultants, evaluation of patient's response to treatment, examination of patient, ordering and  review of laboratory studies, ordering and review of radiographic studies, ordering and performing treatments and interventions, pulse oximetry, re-evaluation of patient's condition, review of old charts and obtaining history from patient or surrogate   Care discussed with: admitting provider     Final Clinical Impressions(s) / ED Diagnoses     ICD-10-CM   1. Symptomatic anemia  D64.9     2. AKI (acute kidney injury) ([place])  N17.9     3. Hyponatremia  E87.1     4. Radiation cystitis  N30.40     5. Hypokalemia  E87.6       ED Discharge Orders     None       Discharge Instructions   None       Jeanell Sparrow, DO 07/08/22 0030

## 2022-07-07 NOTE — H&P (Incomplete)
[name] XMI:[id number] DOB: 01/10/1966 DOA: 07/07/2022     PCP: [name], MD   Outpatient Specialists: * NONE CARDS: * Dr. NEphrology: *  Dr. NEurology *   Dr. Pulmonary *  Dr.  Oncology * Dr. Fabienne Bruns* Dr.  Sadie Haber, [facility]) No care team member to display Urology Dr. *  Patient arrived to ER on 07/07/22 at [facility] Referred by Attending Jeanell Sparrow, DO   Patient coming from:    home Lives alone,   *** With family From facility ***  Chief Complaint: *** Chief Complaint  Patient presents with  . Abnormal Lab    HPI: [name] is a 57 y.o. male with medical history significant of ***     Presented with   * Pt was sent to ER with Hg of 6.9 on Thursday has been fatigued lightheaded SOB  Post op for radiation cytitis Now pink urine but had blood clots before  No N/V no abd pain       Initial COVID TEST  NEGATIVE**** POSITIVE,  ***in house  PCR testing  Pending  No results found for: "SARSCOV2NAA"   Regarding pertinent Chronic problems: ***  ****Hyperlipidemia - *on statins {statin:315258}  Lipid Panel  No results found for: "CHOL", "TRIG", "HDL", "CHOLHDL", "VLDL", "LDLCALC", "LDLDIRECT", "LABVLDL"  ***HTN on   ***chronic CHF diastolic/systolic/ combined - last echo***  *** CAD  - On Aspirin, statin, betablocker, Plavix                 - *followed by cardiology                - last cardiac cath  The ASCVD Risk score (Arnett DK, et al., 2019) failed to calculate for the following reasons:   Cannot find a previous HDL lab   Cannot find a previous total cholesterol lab    ***DM 2 -  Lab Results  Component Value Date   HGBA1C 7.7 (H) 03/21/2022   ****on insulin, PO meds only, diet controlled  ***Hypothyroidism: No results found for: "TSH" on synthroid  *** Morbid obesity-   BMI Readings from Last 1 Encounters:  07/07/22 30.11 kg/m     *** Asthma -well *** controlled on home inhalers/ nebs f                        ***last no  prior***admission  ***                       No ***history of intubation  *** COPD - not **followed by pulmonology *** not  on baseline oxygen  *L,    *** OSA -on nocturnal oxygen, *CPAP, *noncompliant with CPAP  *** Hx of CVA - *with/out residual deficits on Aspirin 81 mg, 325, Plavix  ***A. Fib -  - CHA2DS2 vas score **** CHA2DS2/VAS Stroke Risk Points      N/A >= 2 Points: High Risk  1 - 1.99 Points: Medium Risk  0 Points: Low Risk    Last Change: N/A      This score determines the patient's risk of having a stroke if the  patient has atrial fibrillation.      This score is not applicable to this patient. Components are not  calculated.     current  on anticoagulation with ****Coumadin  ***Xarelto,* Eliquis,  *** Not on anticoagulation secondary to Risk of Falls, *** recurrent bleeding         -  Rate control:  Currently controlled with ***Toprolol,  *Metoprolol,* Diltiazem, *Coreg          - Rhythm control: *** amiodarone, *flecainide  ***Hx of DVT/PE on - anticoagulation with ****Coumadin  ***Xarelto,* Eliquis,   ***CKD stage III*- baseline Cr **** Estimated Creatinine Clearance: 55.9 mL/min (A) (by C-G formula based on SCr of 1.73 mg/dL (H)).  Lab Results  Component Value Date   CREATININE 1.73 (H) 07/07/2022   CREATININE 0.94 06/04/2022   CREATININE 1.17 05/20/2022     **** Liver disease MELD 3.0: 9 at 03/23/2022 12:45 AM MELD-Na: 8 at 03/23/2022 12:45 AM Calculated from: Serum Creatinine: 1.20 mg/dL at 03/23/2022 12:45 AM Serum Sodium: 137 mmol/L at 03/23/2022 12:45 AM Total Bilirubin: 0.4 mg/dL (Using min of 1 mg/dL) at 03/23/2022 12:45 AM Serum Albumin: 3.1 g/dL at 03/23/2022 12:45 AM INR(ratio): 1.0 at 03/21/2022  4:32 AM Age at listing (hypothetical): 5 years Sex: Male at 03/23/2022 12:45 AM    ***BPH - on Flomax, Proscar    *** Dementia - on Aricept** Nemenda  *** Chronic anemia - baseline hg Hemoglobin & Hematocrit  Recent Labs    06/04/22 2000  06/22/22 1520 07/07/22 1917  HGB 9.0* 7.5* 6.3*     While in ER: Clinical Course as of 07/07/22 2356  Tue Jul 07, 2022  2025 Hemoglobin(!!): 6.3 [JL]  2157 Creatinine(!): 1.73 Prior 0.94 1 month ago [SG]  2157 Sodium(!): 127 136 one month ago  [SG]  2158 Hemoglobin(!!): 6.3 9 one month ago, 7.5 three weeks ago [SG]    Clinical Course User Index [JL] Regan Lemming, MD [SG] Jeanell Sparrow, DO       Ordered  CT HEAD *** NON acute  CXR - ***NON acute  CTabd/pelvis - ***nonacute  CTA chest - ***nonacute, no PE, * no evidence of infiltrate  Following Medications were ordered in ER: Medications  0.9 %  sodium chloride infusion (Manually program via Guardrails IV Fluids) (has no administration in time range)  sodium chloride 0.9 % bolus 500 mL (500 mLs Intravenous New Bag/Given 07/07/22 2339)  potassium chloride SA (KLOR-CON M) CR tablet 40 mEq (40 mEq Oral Given 07/07/22 2339)    _______________________________________________________ ER Provider Called:     DrMarland Kitchen  They Recommend admit to medicine *** Will see in AM  ***SEEN in ER   ED Triage Vitals  Enc Vitals Group     BP 07/07/22 1915 137/62     Pulse Rate 07/07/22 1915 91     Resp 07/07/22 1915 (!) 22     Temp 07/07/22 1915 98.2 F (36.8 C)     Temp Source 07/07/22 1915 Oral     SpO2 07/07/22 1915 100 %     Weight 07/07/22 1915 209 lb 14.1 oz (95.2 kg)     Height 07/07/22 1915 '5\' 10"'$  (1.778 m)     Head Circumference --      Peak Flow --      Pain Score 07/07/22 1914 2     Pain Loc --      Pain Edu? --      Excl. in [place]? --   TMAX(24)@     _________________________________________ Significant initial  Findings: Abnormal Labs Reviewed  CBC WITH DIFFERENTIAL/PLATELET - Abnormal; Notable for the following components:      Result Value   RBC 2.13 (*)    Hemoglobin 6.3 (*)    HCT 19.1 (*)    Lymphs Abs 0.3 (*)    Monocytes Absolute 1.4 (*)  Abs Immature Granulocytes 0.08 (*)    All other components  within normal limits  COMPREHENSIVE METABOLIC PANEL - Abnormal; Notable for the following components:   Sodium 127 (*)    Potassium 3.4 (*)    Chloride 92 (*)    Glucose, Bld 277 (*)    BUN 47 (*)    Creatinine, Ser 1.73 (*)    Albumin 3.3 (*)    Alkaline Phosphatase 206 (*)    GFR, Estimated 46 (*)    All other components within normal limits     _________________________ Troponin ***ordered ECG: Ordered Personally reviewed and interpreted by me showing: HR : *** Rhythm: *NSR, Sinus tachycardia * A.fib. W RVR, RBBB, LBBB, Paced Ischemic changes*nonspecific changes, no evidence of ischemic changes QTC*   ____________________ This patient meets SIRS Criteria and may be septic. SIRS = Systemic Inflammatory Response Syndrome  Order a lactic acid level if needed AND/OR Initiate the sepsis protocol with the attached order set OR Click "Treating Associated Infection or Illness" if the patient is being treated for an infection that is a known cause of these abnormalities     The recent clinical data is shown below. Vitals:   07/07/22 2200 07/07/22 2215 07/07/22 2230 07/07/22 2315  BP: (!) 137/100 (!) 155/80 (!) 151/81 (!) 138/50  Pulse: (!) 101 98 (!) 107 93  Resp: 20 19 (!) 24 19  Temp:    98.2 F (36.8 C)  TempSrc:      SpO2: 100% 100% 100% 98%  Weight:      Height:            WBC     Component Value Date/Time   WBC 8.5 07/07/2022 1917   LYMPHSABS 0.3 (L) 07/07/2022 1917   MONOABS 1.4 (H) 07/07/2022 1917   EOSABS 0.0 07/07/2022 1917   BASOSABS 0.0 07/07/2022 1917        Lactic Acid, Venous No results found for: "LATICACIDVEN"   Procalcitonin *** Ordered Lactic Acid, Venous No results found for: "LATICACIDVEN"   Procalcitonin *** Ordered   UA *** no evidence of UTI  ***Pending ***not ordered   Urine analysis:    Component Value Date/Time   COLORURINE RED (A) 05/03/2022 0706   APPEARANCEUR CLOUDY (A) 05/03/2022 0706   LABSPEC <1.005 (L)  05/03/2022 0706   PHURINE 7.0 05/03/2022 0706   GLUCOSEU 100 (A) 05/03/2022 0706   HGBUR LARGE (A) 05/03/2022 0706   BILIRUBINUR NEGATIVE 05/03/2022 0706   KETONESUR NEGATIVE 05/03/2022 0706   PROTEINUR >300 (A) 05/03/2022 0706   NITRITE POSITIVE (A) 05/03/2022 0706   LEUKOCYTESUR SMALL (A) 05/03/2022 0706    Results for orders placed or performed during the hospital encounter of 05/03/22  Urine Culture     Status: None   Collection Time: 05/03/22  7:07 AM   Specimen: Urine, Catheterized  Result Value Ref Range Status   Specimen Description   Final    URINE, CATHETERIZED Performed at [facility], [place] [address] [place], [place] [address]    Special Requests   Final    NONE Performed at [facility], [place] [address] [place], [place] [address]    Culture   Final    NO GROWTH Performed at [facility], [place] [address], [place], [place] [address]    Report Status 05/04/2022 FINAL  Final     _______________________________________________ Hospitalist was called for admission for *** Symptomatic anemia ***  AKI (acute kidney injury) ([place]) ***  Hyponatremia ***  Radiation cystitis ***  Hypokalemia ***    The following Work up has been ordered so far:  Orders Placed This Encounter  Procedures  . Critical Care  . US Renal  . CBC with Differential  . Comprehensive metabolic panel  . Urinalysis, Routine w reflex microscopic  . Occult blood card to lab, stool  . Brain natriuretic peptide  . Bladder scan  . Secure IV for POV transfer  . Informed Consent Details: Physician/Practitioner Attestation; Transcribe to consent form and obtain patient signature  . Consult to hospitalist  . Prepare RBC (crossmatch)  . Type and screen [place] initial  Findings:  labs showing:    Recent Labs  Lab 07/07/22 1917  NA 127*  K 3.4*  CO2 22  GLUCOSE 277*  BUN 47*  CREATININE  1.73*  CALCIUM 8.9    Cr  * stable,  Up from baseline see below Lab Results  Component Value Date   CREATININE 1.73 (H) 07/07/2022   CREATININE 0.94 06/04/2022   CREATININE 1.17 05/20/2022    Recent Labs  Lab 07/07/22 1917  AST 26  ALT 36  ALKPHOS 206*  BILITOT 0.3  PROT 6.6  ALBUMIN 3.3*   Lab Results  Component Value Date   CALCIUM 8.9 07/07/2022          Plt: Lab Results  Component Value Date   PLT 396 07/07/2022       COVID-19 Labs  No results for input(s): "DDIMER", "FERRITIN", "LDH", "CRP" in the last 72 hours.  No results found for: "SARSCOV2NAA"   Arterial ***Venous  Blood Gas result:  pH *** pCO2 ***; pO2 ***;     %O2 Sat ***.  ABG    Component Value Date/Time   TCO2 21 (L) 03/20/2022 2109         Recent Labs  Lab 07/07/22 1917  WBC 8.5  NEUTROABS 6.7  HGB 6.3*  HCT 19.1*  MCV 89.7  PLT 396    HG/HCT * stable,  Down *Up from baseline see below    Component Value Date/Time   HGB 6.3 (LL) 07/07/2022 1917   HCT 19.1 (L) 07/07/2022 1917   MCV 89.7 07/07/2022 1917      No results for input(s): "LIPASE", "AMYLASE" in the last 168 hours. No results for input(s): "AMMONIA" in the last 168 hours.    Cardiac Panel (last 3 results) No results for input(s): "CKTOTAL", "CKMB", "TROPONINI", "RELINDX" in the last 72 hours.  .car BNP (last 3 results) No results for input(s): "BNP" in the last 8760 hours.    DM  labs:  HbA1C: Recent Labs    03/21/22 0432  HGBA1C 7.7*       CBG (last 3)  No results for input(s): "GLUCAP" in the last 72 hours.        Cultures:    Component Value Date/Time   SDES  05/03/2022 0707    URINE, CATHETERIZED Performed at [facility] [address] [place], [place] [address]    SPECREQUEST  05/03/2022 2751    NONE Performed at [facility], [place] [address] [place],  [address]    CULT  05/03/2022 4944    NO GROWTH Performed at [facility], [place] [address], [place],  [address]    REPTSTATUS 05/04/2022 FINAL 05/03/2022 1638     Radiological Exams on Admission: US Renal  Result Date: 07/07/2022 CLINICAL DATA:  Acute renal insufficiency.  EXAM: RENAL / URINARY TRACT ULTRASOUND COMPLETE COMPARISON:  CT abdomen pelvis dated 03/21/2022. FINDINGS: Right Kidney: Renal measurements: 11.4 x 6.1 x 6.0 cm = volume: 216 mL. Normal echogenicity. No hydronephrosis or shadowing stone. Left Kidney: Renal measurements: 12.7 x 6.3 x 6.9 cm = volume: 290 mL. Normal echogenicity. No hydronephrosis or shadowing stone. Bladder: There is slight irregularity of the bladder wall which may represent trabeculation related to chronic bladder outlet obstruction. Focal area of nodular protrusion from the right lateral bladder wall, likely related to underdistention. A bladder lesion is not excluded. Clinical correlation and further evaluation with cystoscopy, if clinically indicated, recommended. Other: The prostate gland is enlarged measuring 6.0 x 3.7 x 3.7 cm. There is median lobe hypertrophy indenting the base of the bladder. IMPRESSION: 1. Unremarkable kidneys. 2. Focal protrusion of the right bladder wall may be related to underdistention. A bladder lesion is not entirely excluded clinical correlation is recommended. 3. Enlarged prostate gland. Electronically Signed   By: Anner Crete M.D.   On: 07/07/2022 22:37   _______________________________________________________________________________________________________ Latest  Blood pressure (!) 138/50, pulse 93, temperature 98.2 F (36.8 C), resp. rate 19, height '5\' 10"'$  (1.778 m), weight 95.2 kg, SpO2 98 %.   Vitals  labs and radiology finding personally reviewed  Review of Systems:    Pertinent positives include: ***  Constitutional:  No weight loss, night sweats, Fevers, chills, fatigue, weight loss  HEENT:  No headaches, Difficulty swallowing,Tooth/dental problems,Sore throat,  No  sneezing, itching, ear ache, nasal congestion, post nasal drip,  Cardio-vascular:  No chest pain, Orthopnea, PND, anasarca, dizziness, palpitations.no Bilateral lower extremity swelling  GI:  No heartburn, indigestion, abdominal pain, nausea, vomiting, diarrhea, change in bowel habits, loss of appetite, melena, blood in stool, hematemesis Resp:  no shortness of breath at rest. No dyspnea on exertion, No excess mucus, no productive cough, No non-productive cough, No coughing up of blood.No change in color of mucus.No wheezing. Skin:  no rash or lesions. No jaundice GU:  no dysuria, change in color of urine, no urgency or frequency. No straining to urinate.  No flank pain.  Musculoskeletal:  No joint pain or no joint swelling. No decreased range of motion. No back pain.  Psych:  No change in mood or affect. No depression or anxiety. No memory loss.  Neuro: no localizing neurological complaints, no tingling, no weakness, no double vision, no gait abnormality, no slurred speech, no confusion  All systems reviewed and apart from [place] all are negative _______________________________________________________________________________________________ Past Medical History:   Past Medical History:  Diagnosis Date  . Asthma   . Cancer [facility])    prostate cancer   . Diabetes mellitus without complication ([place])   . History of bronchitis   . Hypertension   . Prostate cancer ([place])   . Wears glasses       Past Surgical History:  Procedure Laterality Date  . CYSTOSCOPY WITH FULGERATION N/A 03/21/2022   Procedure: CYSTOSCOPY WITH FULGERATION;  Surgeon: Janith Lima, MD;  Location: [facility];  Service: Urology;  Laterality: N/A;  . CYSTOSCOPY WITH FULGERATION N/A 06/22/2022   Procedure: CYSTOSCOPY WITH FULGERATION;  Surgeon: Raynelle Bring, MD;  Location: [facility];  Service: Urology;  Laterality: N/A;  30 MINS FOR CASE  . EYE SURGERY     laser eye surgery bilat   . left groin hernia     .  LYMPHADENECTOMY Bilateral 09/02/2015   Procedure: BILATERAL LYMPHADENECTOMY;  Surgeon: Raynelle Bring, MD;  Location: [facility];  Service:  Urology;  Laterality: Bilateral;  . ROBOT ASSISTED LAPAROSCOPIC RADICAL PROSTATECTOMY N/A 09/02/2015   Procedure: XI ROBOTIC ASSISTED LAPAROSCOPIC RADICAL PROSTATECTOMY LEVEL 2;  Surgeon: Raynelle Bring, MD;  Location: [facility];  Service: Urology;  Laterality: N/A;  . 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 TONSILLECTOMY      Social History:  Ambulatory *** independently cane, walker  wheelchair bound, bed bound     reports that he quit smoking about 24 years ago. His smoking use included cigarettes. He has a 3.50 pack-year smoking history. He has never used smokeless tobacco. He reports current alcohol use. He reports that he does not use drugs.     Family History: *** History reviewed. No pertinent family history. ______________________________________________________________________________________________ Allergies: No Known Allergies   Prior to Admission medications   Medication Sig Start Date End Date Taking? Authorizing Provider  albuterol (VENTOLIN HFA) 108 (90 Base) MCG/ACT inhaler Inhale 2 puffs into the lungs every 6 (six) hours as needed for wheezing or shortness of breath. 06/03/22   [provider]  ALPRAZolam Duanne Moron) 0.25 MG tablet Take 0.25 mg by mouth 3 (three) times daily as needed for anxiety. 03/17/22   [provider]  amLODipine (NORVASC) 5 MG tablet Take 5 mg by mouth daily. 06/23/21   [provider]  Continuous Blood Gluc Sensor (FREESTYLE LIBRE 2 SENSOR) MISC USE TO MONITOR CBG EVERY 14 DAYS AS DIRECTED 06/11/21   [provider]  Insulin Pen Needle (B-D UF III MINI PEN NEEDLES) 31G X 5 MM MISC USE TO INJECT INSULIN 4 TIMES DAILY E10.9 01/22/16   [provider]  Insulin Syringe-Needle U-100 (B-D INS SYRINGE 0.5CC/31GX5/16) 31G X 5/16" 0.5 ML MISC use 1 syringe as directed (does  5 injections a day 02/04/10   [provider]  LYUMJEV TEMPO PEN 100 UNIT/ML SOPN Inject into the skin. 02/05/22   [provider]  oxyCODONE (ROXICODONE) 5 MG immediate release tablet Take 1 tablet (5 mg total) by mouth every 8 (eight) hours as needed. 06/22/22 06/22/23  Raynelle Bring, MD  ramipril (ALTACE) 10 MG capsule Take 10 mg by mouth daily.    [provider]  rosuvastatin (CRESTOR) 5 MG tablet Take 5 mg by mouth 2 (two) times a week.    [provider]  TRESIBA FLEXTOUCH 200 UNIT/ML SOPN Inject 40 Units into the skin every morning. 07/25/15   [provider]  XTANDI 40 MG tablet Take 160 mg by mouth at bedtime. 03/04/22   [provider]    ___________________________________________________________________________________________________ Physical Exam:    07/07/2022   11:15 PM 07/07/2022   10:30 PM 07/07/2022   10:15 PM  Vitals with BMI  Systolic 106 269 485  Diastolic 50 81 80  Pulse 93 107 98     1. General:  in No ***Acute distress***increased work of breathing ***complaining of severe pain****agitated * Chronically ill *well *cachectic *toxic acutely ill -appearing 2. Psychological: Alert and *** Oriented 3. Head/ENT:   Moist *** Dry Mucous Membranes                          Head Non traumatic, neck supple                          Normal *** Poor Dentition 4. SKIN: normal *** decreased Skin turgor,  Skin clean Dry and intact no rash 5. Heart: Regular  rate and rhythm no*** Murmur, no Rub or gallop 6. Lungs: ***Clear to auscultation bilaterally, no wheezes or crackles   7. Abdomen: Soft, ***non-tender, Non distended *** obese ***bowel sounds present 8. Lower extremities: no clubbing, cyanosis, no ***edema 9. Neurologically Grossly intact, moving all 4 extremities equally *** strength 5 out of 5 in all 4 extremities cranial nerves II through XII intact 10. MSK: Normal range of motion    Chart has been  reviewed  ______________________________________________________________________________________________  Assessment/Plan  ***  Admitted for *** Symptomatic anemia ***  AKI (acute kidney injury) ([place]) ***  Hyponatremia ***  Radiation cystitis ***  Hypokalemia ***    Present on Admission: **None**     No problem-specific Assessment & Plan notes found for this encounter.    Other plan as per orders.  DVT prophylaxis:  SCD *** Lovenox       Code Status:    Code Status: Prior FULL CODE *** DNR/DNI ***comfort care as per patient ***family  I had personally discussed CODE STATUS with patient and family* I had spent *min discussing goals of care and CODE STATUS    Family Communication:   Family not at  Bedside  plan of care was discussed on the phone with *** Son, Daughter, Wife, Husband, Sister, Brother , father, mother  Disposition Plan:   *** likely will need placement for rehabilitation                          Back to current facility when stable                            To home once workup is complete and patient is stable  ***Following barriers for discharge:                            Electrolytes corrected                               Anemia corrected                             Pain controlled with PO medications                               Afebrile, white count improving able to transition to PO antibiotics                             Will need to be able to tolerate PO                            Will likely need home health, home O2, set up                           Will need consultants to evaluate patient prior to discharge  ****EXPECT DC tomorrow                    ***Would benefit from PT/OT eval prior to DC  Ordered  Swallow eval - SLP ordered                   Diabetes care coordinator                   Transition of care consulted                   Nutrition    consulted                  Wound care  consulted                    Palliative care    consulted                   Behavioral health  consulted                    Consults called: ***    Admission status:  ED Disposition     ED Disposition  Admit   Condition  --   Comment  The patient appears reasonably stabilized for admission considering the current resources, flow, and capabilities available in the ED at this time, and I doubt any other [facility] requiring further screening and/or treatment in the ED prior to admission is  present.           Obs***  ***  inpatient     I Expect 2 midnight stay secondary to severity of patient's current illness need for inpatient interventions justified by the following: ***hemodynamic instability despite optimal treatment (tachycardia *hypotension * tachypnea *hypoxia, hypercapnia) * Severe lab/radiological/exam abnormalities including:     and extensive comorbidities including: *substance abuse  *Chronic pain *DM2  * CHF * CAD  * COPD/asthma *Morbid Obesity * CKD *dementia *liver disease *history of stroke with residual deficits *  malignancy, * sickle cell disease  History of amputation Chronic anticoagulation  That are currently affecting medical management.   I expect  patient to be hospitalized for 2 midnights requiring inpatient medical care.  Patient is at high risk for adverse outcome (such as loss of life or disability) if not treated.  Indication for inpatient stay as follows:  Severe change from baseline regarding mental status Hemodynamic instability despite maximal medical therapy,  ongoing suicidal ideations,  severe pain requiring acute inpatient management,  inability to maintain oral hydration   persistent chest pain despite medical management Need for operative/procedural  intervention New or worsening hypoxia   Need for IV antibiotics, IV fluids, IV rate controling medications, IV antihypertensives, IV pain medications, IV anticoagulation, need for biPAP     Level of care   *** tele  For 12H 24H     medical floor       progressive tele indefinitely please discontinue once patient no longer qualifies COVID-19 Labs    No results found for: "SARSCOV2NAA"   Precautions: admitted as *** Covid Negative  ***asymptomatic screening protocol****PUI *** covid positive No active isolations ***If Covid PCR is negative  - please DC precautions - would need additional investigation given very high risk for false native test result    Critical***  Patient is critically ill due to  hemodynamic instability * respiratory failure *severe sepsis* ongoing chest pain*  They are at high risk for life/limb threatening clinical deterioration requiring frequent reassessment and modifications of care.  Services provided include examination of the patient, review of relevant ancillary tests, prescription of lifesaving therapies, review of medications and prophylactic  therapy.  Total critical care time excluding separately billable procedures: 60*  Minutes.    Jayde Mcallister 07/07/2022, 11:56 PM ***  [facility]     after 2 AM please page floor coverage PA If 7AM-7PM, please contact the day team taking care of the patient using [email or web address]   Patient was evaluated in the context of the global COVID-19 pandemic, which necessitated consideration that the patient might be at risk for infection with the SARS-CoV-2 virus that causes COVID-19. Institutional protocols and algorithms that pertain to the evaluation of patients at risk for COVID-19 are in a state of rapid change based on information released by regulatory bodies including the CDC and federal and state organizations. These policies and algorithms were followed during the patient's care.

## 2022-07-07 NOTE — Discharge Instructions (Addendum)
You will need to go ER to ER to the [facility] emergency department for a blood transfusion.

## 2022-07-07 NOTE — ED Notes (Signed)
Bladder scan shows 68m.

## 2022-07-08 DIAGNOSIS — E871 Hypo-osmolality and hyponatremia: Secondary | ICD-10-CM | POA: Diagnosis present

## 2022-07-08 DIAGNOSIS — B952 Enterococcus as the cause of diseases classified elsewhere: Secondary | ICD-10-CM | POA: Diagnosis not present

## 2022-07-08 DIAGNOSIS — R7881 Bacteremia: Secondary | ICD-10-CM | POA: Diagnosis not present

## 2022-07-08 DIAGNOSIS — D649 Anemia, unspecified: Secondary | ICD-10-CM | POA: Diagnosis not present

## 2022-07-08 DIAGNOSIS — E1165 Type 2 diabetes mellitus with hyperglycemia: Secondary | ICD-10-CM | POA: Diagnosis not present

## 2022-07-08 DIAGNOSIS — Z79899 Other long term (current) drug therapy: Secondary | ICD-10-CM | POA: Diagnosis not present

## 2022-07-08 DIAGNOSIS — E876 Hypokalemia: Secondary | ICD-10-CM | POA: Diagnosis present

## 2022-07-08 DIAGNOSIS — Y842 Radiological procedure and radiotherapy as the cause of abnormal reaction of the patient, or of later complication, without mention of misadventure at the time of the procedure: Secondary | ICD-10-CM | POA: Diagnosis present

## 2022-07-08 DIAGNOSIS — D75839 Thrombocytosis, unspecified: Secondary | ICD-10-CM | POA: Diagnosis not present

## 2022-07-08 DIAGNOSIS — J45909 Unspecified asthma, uncomplicated: Secondary | ICD-10-CM | POA: Diagnosis present

## 2022-07-08 DIAGNOSIS — E860 Dehydration: Secondary | ICD-10-CM | POA: Diagnosis present

## 2022-07-08 DIAGNOSIS — Z923 Personal history of irradiation: Secondary | ICD-10-CM | POA: Diagnosis not present

## 2022-07-08 DIAGNOSIS — Z87891 Personal history of nicotine dependence: Secondary | ICD-10-CM | POA: Diagnosis not present

## 2022-07-08 DIAGNOSIS — N179 Acute kidney failure, unspecified: Secondary | ICD-10-CM | POA: Diagnosis present

## 2022-07-08 DIAGNOSIS — E669 Obesity, unspecified: Secondary | ICD-10-CM | POA: Diagnosis present

## 2022-07-08 DIAGNOSIS — R310 Gross hematuria: Secondary | ICD-10-CM | POA: Diagnosis present

## 2022-07-08 DIAGNOSIS — C7951 Secondary malignant neoplasm of bone: Secondary | ICD-10-CM | POA: Diagnosis present

## 2022-07-08 DIAGNOSIS — B9561 Methicillin susceptible Staphylococcus aureus infection as the cause of diseases classified elsewhere: Secondary | ICD-10-CM | POA: Diagnosis not present

## 2022-07-08 DIAGNOSIS — Z794 Long term (current) use of insulin: Secondary | ICD-10-CM | POA: Diagnosis not present

## 2022-07-08 DIAGNOSIS — I10 Essential (primary) hypertension: Secondary | ICD-10-CM | POA: Diagnosis present

## 2022-07-08 DIAGNOSIS — E785 Hyperlipidemia, unspecified: Secondary | ICD-10-CM | POA: Diagnosis present

## 2022-07-08 DIAGNOSIS — N3041 Irradiation cystitis with hematuria: Secondary | ICD-10-CM | POA: Diagnosis present

## 2022-07-08 DIAGNOSIS — C61 Malignant neoplasm of prostate: Secondary | ICD-10-CM | POA: Diagnosis present

## 2022-07-08 DIAGNOSIS — D630 Anemia in neoplastic disease: Secondary | ICD-10-CM | POA: Diagnosis present

## 2022-07-08 DIAGNOSIS — Y92009 Unspecified place in unspecified non-institutional (private) residence as the place of occurrence of the external cause: Secondary | ICD-10-CM | POA: Diagnosis not present

## 2022-07-08 DIAGNOSIS — D500 Iron deficiency anemia secondary to blood loss (chronic): Secondary | ICD-10-CM | POA: Diagnosis present

## 2022-07-08 LAB — CBC WITH DIFFERENTIAL/PLATELET
Abs Immature Granulocytes: 0.05 10*3/uL (ref 0.00–0.07)
Basophils Absolute: 0 10*3/uL (ref 0.0–0.1)
Basophils Relative: 0 %
Eosinophils Absolute: 0.1 10*3/uL (ref 0.0–0.5)
Eosinophils Relative: 1 %
HCT: 19.8 % — ABNORMAL LOW (ref 39.0–52.0)
Hemoglobin: 6.5 g/dL — CL (ref 13.0–17.0)
Immature Granulocytes: 1 %
Lymphocytes Relative: 4 %
Lymphs Abs: 0.4 10*3/uL — ABNORMAL LOW (ref 0.7–4.0)
MCH: 29.8 pg (ref 26.0–34.0)
MCHC: 32.8 g/dL (ref 30.0–36.0)
MCV: 90.8 fL (ref 80.0–100.0)
Monocytes Absolute: 1.4 10*3/uL — ABNORMAL HIGH (ref 0.1–1.0)
Monocytes Relative: 17 %
Neutro Abs: 6.2 10*3/uL (ref 1.7–7.7)
Neutrophils Relative %: 77 %
Platelets: 332 10*3/uL (ref 150–400)
RBC: 2.18 MIL/uL — ABNORMAL LOW (ref 4.22–5.81)
RDW: 13.8 % (ref 11.5–15.5)
WBC: 8.1 10*3/uL (ref 4.0–10.5)
nRBC: 0 % (ref 0.0–0.2)

## 2022-07-08 LAB — URINALYSIS, COMPLETE (UACMP) WITH MICROSCOPIC
Bilirubin Urine: NEGATIVE
Glucose, UA: >=500 mg/dL — AB
Ketones, ur: 5 mg/dL — AB
Nitrite: POSITIVE — AB
Protein, ur: 100 mg/dL — AB
Specific Gravity, Urine: 1.014 (ref 1.005–1.030)
WBC, UA: >50 WBC/hpf — ABNORMAL HIGH (ref 0–5)
pH: 5 (ref 5.0–8.0)

## 2022-07-08 LAB — PREPARE RBC (CROSSMATCH)

## 2022-07-08 LAB — COMPREHENSIVE METABOLIC PANEL
ALT: 35 U/L (ref 0–44)
AST: 27 U/L (ref 15–41)
Albumin: 2.2 g/dL — ABNORMAL LOW (ref 3.5–5.0)
Alkaline Phosphatase: 247 U/L — ABNORMAL HIGH (ref 38–126)
Anion gap: 11 (ref 5–15)
BUN: 33 mg/dL — ABNORMAL HIGH (ref 6–20)
CO2: 22 mmol/L (ref 22–32)
Calcium: 7.9 mg/dL — ABNORMAL LOW (ref 8.9–10.3)
Chloride: 100 mmol/L (ref 98–111)
Creatinine, Ser: 1.28 mg/dL — ABNORMAL HIGH (ref 0.61–1.24)
GFR, Estimated: >60 mL/min (ref >60)
Glucose, Bld: 256 mg/dL — ABNORMAL HIGH (ref 70–99)
Potassium: 3.5 mmol/L (ref 3.5–5.1)
Sodium: 133 mmol/L — ABNORMAL LOW (ref 135–145)
Total Bilirubin: 0.5 mg/dL (ref 0.3–1.2)
Total Protein: 5.8 g/dL — ABNORMAL LOW (ref 6.5–8.1)

## 2022-07-08 LAB — CBC
HCT: 23.6 % — ABNORMAL LOW (ref 39.0–52.0)
Hemoglobin: 7.9 g/dL — ABNORMAL LOW (ref 13.0–17.0)
MCH: 29.7 pg (ref 26.0–34.0)
MCHC: 33.5 g/dL (ref 30.0–36.0)
MCV: 88.7 fL (ref 80.0–100.0)
Platelets: 377 10*3/uL (ref 150–400)
RBC: 2.66 MIL/uL — ABNORMAL LOW (ref 4.22–5.81)
RDW: 14.6 % (ref 11.5–15.5)
WBC: 9.7 10*3/uL (ref 4.0–10.5)
nRBC: 0 % (ref 0.0–0.2)

## 2022-07-08 LAB — MAGNESIUM: Magnesium: 1.9 mg/dL (ref 1.7–2.4)

## 2022-07-08 LAB — CBG MONITORING, ED
Glucose-Capillary: 349 mg/dL — ABNORMAL HIGH (ref 70–99)
Glucose-Capillary: 278 mg/dL — ABNORMAL HIGH (ref 70–99)
Glucose-Capillary: 138 mg/dL — ABNORMAL HIGH (ref 70–99)
Glucose-Capillary: 171 mg/dL — ABNORMAL HIGH (ref 70–99)
Glucose-Capillary: 249 mg/dL — ABNORMAL HIGH (ref 70–99)
Glucose-Capillary: 264 mg/dL — ABNORMAL HIGH (ref 70–99)

## 2022-07-08 LAB — BRAIN NATRIURETIC PEPTIDE: B Natriuretic Peptide: 197.3 pg/mL — ABNORMAL HIGH (ref 0.0–100.0)

## 2022-07-08 LAB — SODIUM, URINE, RANDOM: Sodium, Ur: 12 mmol/L

## 2022-07-08 LAB — PHOSPHORUS: Phosphorus: 1.7 mg/dL — ABNORMAL LOW (ref 2.5–4.6)

## 2022-07-08 LAB — CREATININE, URINE, RANDOM: Creatinine, Urine: 53 mg/dL

## 2022-07-08 MED ORDER — SODIUM PHOSPHATES 45 MMOLE/15ML IV SOLN
30.0000 mmol | Freq: Once | INTRAVENOUS | Status: AC
  Administered 2022-07-08: 30 mmol via INTRAVENOUS
  Filled 2022-07-08: qty 10

## 2022-07-08 MED ORDER — INSULIN GLARGINE-YFGN 100 UNIT/ML ~~LOC~~ SOLN
20.0000 [IU] | Freq: Every day | SUBCUTANEOUS | Status: DC
  Administered 2022-07-08 – 2022-07-09 (×2): 20 [IU] via SUBCUTANEOUS
  Filled 2022-07-08 (×2): qty 0.2

## 2022-07-08 MED ORDER — HYDROCODONE-ACETAMINOPHEN 5-325 MG PO TABS: 1.0000 | ORAL_TABLET | ORAL | Status: DC | PRN

## 2022-07-08 MED ORDER — ALBUTEROL SULFATE (2.5 MG/3ML) 0.083% IN NEBU: 2.5000 mg | INHALATION_SOLUTION | Freq: Four times a day (QID) | RESPIRATORY_TRACT | Status: DC | PRN

## 2022-07-08 MED ORDER — ACETAMINOPHEN 325 MG PO TABS
650.0000 mg | ORAL_TABLET | Freq: Four times a day (QID) | ORAL | Status: DC | PRN
  Administered 2022-07-09: 650 mg via ORAL
  Filled 2022-07-08: qty 2

## 2022-07-08 MED ORDER — ROSUVASTATIN CALCIUM 5 MG PO TABS
5.0000 mg | ORAL_TABLET | ORAL | Status: DC
  Administered 2022-07-09 – 2022-07-16 (×3): 5 mg via ORAL
  Filled 2022-07-08 (×3): qty 1

## 2022-07-08 MED ORDER — INSULIN DEGLUDEC 200 UNIT/ML ~~LOC~~ SOPN: PEN_INJECTOR | Freq: Every morning | SUBCUTANEOUS | Status: DC

## 2022-07-08 MED ORDER — SODIUM CHLORIDE 0.9 % IV SOLN: INTRAVENOUS | Status: AC

## 2022-07-08 MED ORDER — SODIUM CHLORIDE 0.9% IV SOLUTION: Freq: Once | INTRAVENOUS | Status: AC

## 2022-07-08 MED ORDER — INSULIN ASPART 100 UNIT/ML IJ SOLN
0.0000 [IU] | INTRAMUSCULAR | Status: DC
  Administered 2022-07-08: 2 [IU] via SUBCUTANEOUS
  Administered 2022-07-08: 7 [IU] via SUBCUTANEOUS
  Administered 2022-07-08: 5 [IU] via SUBCUTANEOUS
  Administered 2022-07-08: 3 [IU] via SUBCUTANEOUS
  Administered 2022-07-08: 1 [IU] via SUBCUTANEOUS
  Administered 2022-07-08: 5 [IU] via SUBCUTANEOUS
  Administered 2022-07-09: 2 [IU] via SUBCUTANEOUS
  Administered 2022-07-09 (×2): 5 [IU] via SUBCUTANEOUS
  Administered 2022-07-09: 3 [IU] via SUBCUTANEOUS
  Administered 2022-07-09 – 2022-07-10 (×2): 2 [IU] via SUBCUTANEOUS
  Administered 2022-07-10: 1 [IU] via SUBCUTANEOUS
  Administered 2022-07-10: 2 [IU] via SUBCUTANEOUS
  Administered 2022-07-10 (×2): 3 [IU] via SUBCUTANEOUS
  Administered 2022-07-10: 1 [IU] via SUBCUTANEOUS
  Administered 2022-07-11 (×3): 3 [IU] via SUBCUTANEOUS
  Administered 2022-07-11: 2 [IU] via SUBCUTANEOUS
  Administered 2022-07-11: 1 [IU] via SUBCUTANEOUS
  Administered 2022-07-11: 2 [IU] via SUBCUTANEOUS
  Administered 2022-07-12: 3 [IU] via SUBCUTANEOUS
  Administered 2022-07-12: 1 [IU] via SUBCUTANEOUS
  Administered 2022-07-12: 2 [IU] via SUBCUTANEOUS
  Administered 2022-07-12: 5 [IU] via SUBCUTANEOUS
  Administered 2022-07-12: 2 [IU] via SUBCUTANEOUS
  Administered 2022-07-12: 3 [IU] via SUBCUTANEOUS
  Administered 2022-07-12 – 2022-07-13 (×7): 2 [IU] via SUBCUTANEOUS
  Administered 2022-07-14: 5 [IU] via SUBCUTANEOUS
  Administered 2022-07-14 (×2): 2 [IU] via SUBCUTANEOUS
  Administered 2022-07-14: 3 [IU] via SUBCUTANEOUS
  Administered 2022-07-15: 2 [IU] via SUBCUTANEOUS
  Administered 2022-07-15: 9 [IU] via SUBCUTANEOUS
  Administered 2022-07-15 – 2022-07-16 (×3): 2 [IU] via SUBCUTANEOUS
  Administered 2022-07-16: 3 [IU] via SUBCUTANEOUS
  Administered 2022-07-16: 2 [IU] via SUBCUTANEOUS
  Administered 2022-07-16: 1 [IU] via SUBCUTANEOUS
  Filled 2022-07-08: qty 0.09

## 2022-07-08 MED ORDER — ACETAMINOPHEN 650 MG RE SUPP: 650.0000 mg | Freq: Four times a day (QID) | RECTAL | Status: DC | PRN

## 2022-07-08 MED ORDER — ALPRAZOLAM 0.25 MG PO TABS: 0.2500 mg | ORAL_TABLET | Freq: Three times a day (TID) | ORAL | Status: DC | PRN

## 2022-07-08 MED ORDER — SODIUM CHLORIDE 0.9% IV SOLUTION: Freq: Once | INTRAVENOUS | Status: DC

## 2022-07-08 MED ORDER — ALBUTEROL SULFATE HFA 108 (90 BASE) MCG/ACT IN AERS: 2.0000 | INHALATION_SPRAY | Freq: Four times a day (QID) | RESPIRATORY_TRACT | Status: DC | PRN

## 2022-07-08 MED ORDER — POTASSIUM CHLORIDE CRYS ER 20 MEQ PO TBCR: 40.0000 meq | EXTENDED_RELEASE_TABLET | Freq: Once | ORAL | Status: DC

## 2022-07-08 NOTE — ED Notes (Signed)
ED TO INPATIENT HANDOFF REPORT  ED Nurse Name and Phone #: Zamara Cozad    S Name/Age/Gender [name] 57 y.o. male Room/Bed: WA24/WA24  Code Status   Code Status: Full Code  Home/SNF/Other  Patient oriented to: self, place, time, and situation Is this baseline? Yes   Triage Complete: Triage complete  Chief Complaint Symptomatic anemia [D64.9]  Triage Note POV from home, sent here for low hemoglobin of 6.9 that was taken last Thursday. Pt weak and pale. 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 Pt alert and oriented x 4, BIB wheelchair.    Allergies No Known Allergies  Level of Care/Admitting Diagnosis ED Disposition     ED Disposition  Admit   Condition  --   Comment  Hospital Area: [place] [[id number]]  Level of Care: Telemetry [5]  Admit to tele based on following criteria: Other see comments  Comments: symptomatic anemia  May place patient in observation at [facility] or [place] if equivalent level of care is available:: No  Covid Evaluation: Asymptomatic - no recent exposure (last 10 days) testing not required  Diagnosis: Symptomatic anemia [6283151]  Admitting Physician: Toy Baker [[id number]]  Attending Physician: Toy Baker [[id number]]          B Medical/Surgery History Past Medical History:  Diagnosis Date   Asthma    Cancer ([place])    prostate cancer    Diabetes mellitus without complication ([place])    History of bronchitis    Hypertension    Prostate cancer ([place])    Wears glasses    Past Surgical History:  Procedure Laterality Date   CYSTOSCOPY WITH FULGERATION N/A 03/21/2022   Procedure: CYSTOSCOPY WITH FULGERATION;  Surgeon: Janith Lima, MD;  Location: [facility];  Service: Urology;  Laterality: N/A;   CYSTOSCOPY WITH FULGERATION N/A 06/22/2022   Procedure: CYSTOSCOPY WITH FULGERATION;  Surgeon: Raynelle Bring, MD;   Location: [facility];  Service: Urology;  Laterality: N/A;  30 MINS FOR CASE   EYE SURGERY     laser eye surgery bilat    left groin hernia      LYMPHADENECTOMY Bilateral 09/02/2015   Procedure: BILATERAL LYMPHADENECTOMY;  Surgeon: Raynelle Bring, MD;  Location: [facility];  Service: Urology;  Laterality: Bilateral;   ROBOT ASSISTED LAPAROSCOPIC RADICAL PROSTATECTOMY N/A 09/02/2015   Procedure: XI ROBOTIC ASSISTED LAPAROSCOPIC RADICAL PROSTATECTOMY LEVEL 2;  Surgeon: Raynelle Bring, MD;  Location: [facility];  Service: Urology;  Laterality: N/A;   schwannoma     removed approx 16 to 18 years ago    TONSILLECTOMY       A IV Location/Drains/Wounds Patient Lines/Drains/Airways Status     Active Line/Drains/Airways     Name Placement date Placement time Site Days   Peripheral IV 07/07/22 20 G Anterior;Distal;Right;Upper Arm 07/07/22  1910  Arm  1            Intake/Output Last 24 hours  Intake/Output Summary (Last 24 hours) at 07/08/2022 1755 Last data filed at 07/08/2022 1300 Gross per 24 hour  Intake 325 ml  Output --  Net 325 ml    Labs/Imaging Results for orders placed or performed during the hospital encounter of 07/07/22 (from the past 48 hour(s))  CBC with Differential     Status: Abnormal   Collection Time: 07/07/22  7:17 PM  Result Value Ref Range   WBC 8.5 4.0 - 10.5 K/uL  RBC 2.13 (L) 4.22 - 5.81 MIL/uL   Hemoglobin 6.3 (LL) 13.0 - 17.0 g/dL    Comment: This critical result has verified and been called to A POWELL,RN by Gerrie Nordmann on 01 23 2024 at [place], and has been read back.    HCT 19.1 (L) 39.0 - 52.0 %   MCV 89.7 80.0 - 100.0 fL   MCH 29.6 26.0 - 34.0 pg   MCHC 33.0 30.0 - 36.0 g/dL   RDW 13.3 11.5 - 15.5 %   Platelets 396 150 - 400 K/uL   nRBC 0.0 0.0 - 0.2 %   Neutrophils Relative % 79 %   Neutro Abs 6.7 1.7 - 7.7 K/uL   Lymphocytes Relative 3 %   Lymphs Abs 0.3 (L) 0.7 - 4.0 K/uL   Monocytes Relative 16 %   Monocytes Absolute 1.4 (H) 0.1 - 1.0 K/uL    Eosinophils Relative 1 %   Eosinophils Absolute 0.0 0.0 - 0.5 K/uL   Basophils Relative 0 %   Basophils Absolute 0.0 0.0 - 0.1 K/uL   Immature Granulocytes 1 %   Abs Immature Granulocytes 0.08 (H) 0.00 - 0.07 K/uL    Comment: Performed at [facility], [place], [place] [address]  Comprehensive metabolic panel     Status: Abnormal   Collection Time: 07/07/22  7:17 PM  Result Value Ref Range   Sodium 127 (L) 135 - 145 mmol/L   Potassium 3.4 (L) 3.5 - 5.1 mmol/L   Chloride 92 (L) 98 - 111 mmol/L   CO2 22 22 - 32 mmol/L   Glucose, Bld 277 (H) 70 - 99 mg/dL    Comment: Glucose reference range applies only to samples taken after fasting for at least 8 hours.   BUN 47 (H) 6 - 20 mg/dL   Creatinine, Ser 1.73 (H) 0.61 - 1.24 mg/dL   Calcium 8.9 8.9 - 10.3 mg/dL   Total Protein 6.6 6.5 - 8.1 g/dL   Albumin 3.3 (L) 3.5 - 5.0 g/dL   AST 26 15 - 41 U/L   ALT 36 0 - 44 U/L   Alkaline Phosphatase 206 (H) 38 - 126 U/L   Total Bilirubin 0.3 0.3 - 1.2 mg/dL   GFR, Estimated 46 (L) >60 mL/min    Comment: (NOTE) Calculated using the CKD-EPI Creatinine Equation (2021)    Anion gap 13 5 - 15    Comment: Performed at [facility], [address] [place], [place] [address]  Occult blood card to lab, stool     Status: None   Collection Time: 07/07/22  8:28 PM  Result Value Ref Range   Fecal Occult Bld NEGATIVE NEGATIVE    Comment: Performed at [facility], [address], [place], [place] [address]  Prepare RBC (crossmatch)     Status: None   Collection Time: 07/07/22 10:43 PM  Result Value Ref Range   Order Confirmation      ORDER PROCESSED BY BLOOD BANK Performed at [facility], [place] [address], [place], [place] [address]   Type and screen [place]     Status: None (Preliminary result)   Collection Time: 07/07/22 10:43 PM  Result Value Ref Range   ABO/RH(D) O POS    Antibody  Screen NEG    Sample Expiration 07/10/2022,2359    Unit Number [id number]    Blood Component Type RED CELLS,LR    Unit division 00    Status of Unit ISSUED  Transfusion Status OK TO TRANSFUSE    Crossmatch Result Compatible    Unit Number [id number]    Blood Component Type RED CELLS,LR    Unit division 00    Status of Unit ISSUED    Transfusion Status OK TO TRANSFUSE    Crossmatch Result      Compatible Performed at [place] [address] [place], [place] [address]   Brain natriuretic peptide     Status: Abnormal   Collection Time: 07/07/22 11:32 PM  Result Value Ref Range   B Natriuretic Peptide 197.3 (H) 0.0 - 100.0 pg/mL    Comment: Performed at [facility], [place] [address] [place], [place] [address]  Creatinine, urine, random     Status: None   Collection Time: 07/08/22 12:42 AM  Result Value Ref Range   Creatinine, Urine 53 mg/dL    Comment: Performed at [facility], [place] [address], [place], [place] [address]  Urinalysis, Complete w Microscopic Urine, Clean Catch     Status: Abnormal   Collection Time: 07/08/22 12:42 AM  Result Value Ref Range   Color, Urine YELLOW YELLOW   APPearance HAZY (A) CLEAR   Specific Gravity, Urine 1.014 1.005 - 1.030   pH 5.0 5.0 - 8.0   Glucose, UA >=500 (A) NEGATIVE mg/dL   Hgb urine dipstick LARGE (A) NEGATIVE   Bilirubin Urine NEGATIVE NEGATIVE   Ketones, ur 5 (A) NEGATIVE mg/dL   Protein, ur 100 (A) NEGATIVE mg/dL   Nitrite POSITIVE (A) NEGATIVE   Leukocytes,Ua LARGE (A) NEGATIVE   RBC / HPF 0-5 0 - 5 RBC/hpf   WBC, UA >50 (H) 0 - 5 WBC/hpf   Bacteria, UA FEW (A) NONE SEEN   Squamous Epithelial / HPF 0-5 0 - 5 /HPF   WBC Clumps PRESENT    Mucus PRESENT    Hyaline Casts, UA PRESENT     Comment: Performed at [facility], [place] [address], [place], [place] [address]  Sodium, urine, random     Status: None   Collection Time: 07/08/22 12:42 AM   Result Value Ref Range   Sodium, Ur 12 mmol/L    Comment: Performed at [facility] [place] [address] [place],  [address]  CBG monitoring, ED     Status: Abnormal   Collection Time: 07/08/22  1:06 AM  Result Value Ref Range   Glucose-Capillary 349 (H) 70 - 99 mg/dL    Comment: Glucose reference range applies only to samples taken after fasting for at least 8 hours.  CBG monitoring, ED     Status: Abnormal   Collection Time: 07/08/22  3:53 AM  Result Value Ref Range   Glucose-Capillary 278 (H) 70 - 99 mg/dL    Comment: Glucose reference range applies only to samples taken after fasting for at least 8 hours.  CBC with Differential     Status: Abnormal   Collection Time: 07/08/22  5:45 AM  Result Value Ref Range   WBC 8.1 4.0 - 10.5 K/uL   RBC 2.18 (L) 4.22 - 5.81 MIL/uL   Hemoglobin 6.5 (LL) 13.0 - 17.0 g/dL    Comment: REPEATED TO VERIFY THIS CRITICAL RESULT HAS VERIFIED AND BEEN CALLED TO D. BRATU EMT BY ATCHISON,MARY ON 01 24 2024 AT 0611, AND HAS BEEN READ BACK.     HCT 19.8 (L) 39.0 - 52.0 %   MCV 90.8 80.0 - 100.0 fL   MCH 29.8 26.0 - 34.0 pg  MCHC 32.8 30.0 - 36.0 g/dL   RDW 13.8 11.5 - 15.5 %   Platelets 332 150 - 400 K/uL   nRBC 0.0 0.0 - 0.2 %   Neutrophils Relative % 77 %   Neutro Abs 6.2 1.7 - 7.7 K/uL   Lymphocytes Relative 4 %   Lymphs Abs 0.4 (L) 0.7 - 4.0 K/uL   Monocytes Relative 17 %   Monocytes Absolute 1.4 (H) 0.1 - 1.0 K/uL   Eosinophils Relative 1 %   Eosinophils Absolute 0.1 0.0 - 0.5 K/uL   Basophils Relative 0 %   Basophils Absolute 0.0 0.0 - 0.1 K/uL   Immature Granulocytes 1 %   Abs Immature Granulocytes 0.05 0.00 - 0.07 K/uL    Comment: Performed at [facility], [place] [address], [place], [place] [address]  Prepare RBC (crossmatch)     Status: None   Collection Time: 07/08/22  6:26 AM  Result Value Ref Range   Order Confirmation      ORDER PROCESSED BY BLOOD BANK Performed at [facility], [place] [address] [place], [place] [address]   CBG monitoring, ED     Status: Abnormal   Collection Time: 07/08/22  7:38 AM  Result Value Ref Range   Glucose-Capillary 138 (H) 70 - 99 mg/dL    Comment: Glucose reference range applies only to samples taken after fasting for at least 8 hours.  CBG monitoring, ED     Status: Abnormal   Collection Time: 07/08/22 11:40 AM  Result Value Ref Range   Glucose-Capillary 171 (H) 70 - 99 mg/dL    Comment: Glucose reference range applies only to samples taken after fasting for at least 8 hours.  Comprehensive metabolic panel     Status: Abnormal   Collection Time: 07/08/22  2:45 PM  Result Value Ref Range   Sodium 133 (L) 135 - 145 mmol/L   Potassium 3.5 3.5 - 5.1 mmol/L   Chloride 100 98 - 111 mmol/L   CO2 22 22 - 32 mmol/L   Glucose, Bld 256 (H) 70 - 99 mg/dL    Comment: Glucose reference range applies only to samples taken after fasting for at least 8 hours.   BUN 33 (H) 6 - 20 mg/dL   Creatinine, Ser 1.28 (H) 0.61 - 1.24 mg/dL   Calcium 7.9 (L) 8.9 - 10.3 mg/dL   Total Protein 5.8 (L) 6.5 - 8.1 g/dL   Albumin 2.2 (L) 3.5 - 5.0 g/dL   AST 27 15 - 41 U/L   ALT 35 0 - 44 U/L   Alkaline Phosphatase 247 (H) 38 - 126 U/L   Total Bilirubin 0.5 0.3 - 1.2 mg/dL   GFR, Estimated >60 >60 mL/min    Comment: (NOTE) Calculated using the CKD-EPI Creatinine Equation (2021)    Anion gap 11 5 - 15    Comment: Performed at [facility], [place] [address], [place], [place] [address]  Magnesium     Status: None   Collection Time: 07/08/22  2:45 PM  Result Value Ref Range   Magnesium 1.9 1.7 - 2.4 mg/dL    Comment: Performed at [facility], [place] [address] [place], [place] [address]  Phosphorus     Status: Abnormal   Collection Time: 07/08/22  2:45 PM  Result Value Ref Range   Phosphorus 1.7 (L) 2.5 - 4.6 mg/dL    Comment: Performed at [facility], [place] [address], [place], [place]  [address]  CBC     Status: Abnormal   Collection Time: 07/08/22  2:54 PM  Result Value Ref Range   WBC 9.7 4.0 - 10.5 K/uL   RBC 2.66 (L) 4.22 - 5.81 MIL/uL   Hemoglobin 7.9 (L) 13.0 - 17.0 g/dL   HCT 23.6 (L) 39.0 - 52.0 %   MCV 88.7 80.0 - 100.0 fL   MCH 29.7 26.0 - 34.0 pg   MCHC 33.5 30.0 - 36.0 g/dL   RDW 14.6 11.5 - 15.5 %   Platelets 377 150 - 400 K/uL   nRBC 0.0 0.0 - 0.2 %    Comment: Performed at [facility], [place] [address] [place], [place] [address]  CBG monitoring, ED     Status: Abnormal   Collection Time: 07/08/22  4:24 PM  Result Value Ref Range   Glucose-Capillary 249 (H) 70 - 99 mg/dL    Comment: Glucose reference range applies only to samples taken after fasting for at least 8 hours.   US Renal  Result Date: 07/07/2022 CLINICAL DATA:  Acute renal insufficiency. EXAM: RENAL / URINARY TRACT ULTRASOUND COMPLETE COMPARISON:  CT abdomen pelvis dated 03/21/2022. FINDINGS: Right Kidney: Renal measurements: 11.4 x 6.1 x 6.0 cm = volume: 216 mL. Normal echogenicity. No hydronephrosis or shadowing stone. Left Kidney: Renal measurements: 12.7 x 6.3 x 6.9 cm = volume: 290 mL. Normal echogenicity. No hydronephrosis or shadowing stone. Bladder: There is slight irregularity of the bladder wall which may represent trabeculation related to chronic bladder outlet obstruction. Focal area of nodular protrusion from the right lateral bladder wall, likely related to underdistention. A bladder lesion is not excluded. Clinical correlation and further evaluation with cystoscopy, if clinically indicated, recommended. Other: The prostate gland is enlarged measuring 6.0 x 3.7 x 3.7 cm. There is median lobe hypertrophy indenting the base of the bladder. IMPRESSION: 1. Unremarkable kidneys. 2. Focal protrusion of the right bladder wall may be related to underdistention. A bladder lesion is not entirely excluded clinical correlation is recommended. 3. Enlarged prostate gland.  Electronically Signed   By: Anner Crete M.D.   On: 07/07/2022 22:37    Pending Labs Unresulted Labs (From admission, onward)     Start     Ordered   07/09/22 0500  CBC  Tomorrow morning,   R        07/08/22 1113   07/09/22 0500  Comprehensive metabolic panel  Tomorrow morning,   R        07/08/22 1113   07/08/22 9811  Basic metabolic panel  Once,   R        07/08/22 0750   07/08/22 0500  Prealbumin  Tomorrow morning,   R        07/07/22 2357   07/07/22 2358  CK  Add-on,   AD        07/07/22 2357   07/07/22 2358  Osmolality  Add-on,   AD        07/07/22 2357   07/07/22 2358  Osmolality, urine  Once,   URGENT        07/07/22 2357   07/07/22 2358  Magnesium  Add-on,   AD        07/07/22 2357   07/07/22 2358  Phosphorus  Add-on,   AD        07/07/22 2357   07/07/22 2358  TSH  Add-on,   AD        07/07/22 2357   07/07/22 2358  Lactic acid, plasma  STAT Now then every 3 hours,   R (with STAT occurrences)     Question:  Release to patient  Answer:  Immediate   07/07/22 2357   07/07/22 2358  Hepatic function panel  Add-on,   AD       Question:  Release to patient  Answer:  Immediate   07/07/22 2357   07/07/22 2358  Vitamin B12  (Anemia Panel (PNL))  Once,   URGENT        07/07/22 2357   07/07/22 2358  Folate  (Anemia Panel (PNL))  Once,   URGENT        07/07/22 2357   07/07/22 2358  Iron and TIBC  (Anemia Panel (PNL))  Once,   URGENT        07/07/22 2357   07/07/22 2358  Ferritin  (Anemia Panel (PNL))  Once,   URGENT        07/07/22 2357   07/07/22 2358  Reticulocytes  (Anemia Panel (PNL))  Once,   URGENT        07/07/22 2357            Vitals/Pain Today's Vitals   07/08/22 1500 07/08/22 1652 07/08/22 1739 07/08/22 1750  BP: (!) 189/95   (!) 144/63  Pulse: (!) 103   88  Resp: 18   18  Temp:  98.1 F (36.7 C) 97.9 F (36.6 C)   TempSrc:   Oral   SpO2: 100%   99%  Weight:      Height:      PainSc:        Isolation Precautions No active  isolations  Medications Medications  insulin aspart (novoLOG) injection 0-9 Units (3 Units Subcutaneous Given 07/08/22 1735)  ALPRAZolam (XANAX) tablet 0.25 mg (has no administration in time range)  rosuvastatin (CRESTOR) tablet 5 mg (has no administration in time range)  0.9 %  sodium chloride infusion ( Intravenous New Bag/Given 07/08/22 1016)  acetaminophen (TYLENOL) tablet 650 mg (has no administration in time range)    Or  acetaminophen (TYLENOL) suppository 650 mg (has no administration in time range)  HYDROcodone-acetaminophen (NORCO/VICODIN) 5-325 MG per tablet 1-2 tablet (has no administration in time range)  0.9 %  sodium chloride infusion (Manually program via Guardrails IV Fluids) ( Intravenous Not Given 07/08/22 1023)  albuterol (PROVENTIL) (2.5 MG/3ML) 0.083% nebulizer solution 2.5 mg (has no administration in time range)  insulin glargine-yfgn (SEMGLEE) injection 20 Units (20 Units Subcutaneous Given 07/08/22 1128)  sodium phosphate 30 mmol in dextrose 5 % 250 mL infusion (has no administration in time range)  0.9 %  sodium chloride infusion (Manually program via Guardrails IV Fluids) (0 mLs Intravenous Stopped 07/08/22 0447)  sodium chloride 0.9 % bolus 500 mL (0 mLs Intravenous Stopped 07/08/22 0103)  potassium chloride SA (KLOR-CON M) CR tablet 40 mEq (40 mEq Oral Given 07/07/22 2339)  0.9 %  sodium chloride infusion (Manually program via Guardrails IV Fluids) (0 mLs Intravenous Stopped 07/08/22 0645)    Mobility walks     Focused Assessments     R Recommendations: See Admitting Provider Note  Report given to:   Additional Notes:

## 2022-07-08 NOTE — Plan of Care (Signed)

## 2022-07-08 NOTE — Assessment & Plan Note (Signed)
Order SSI and continue Tresiba at 40 unit

## 2022-07-08 NOTE — Assessment & Plan Note (Signed)
-    evidence of acute renal failure due to presence of following: Cr increased >0.3 from baseline   likely secondary to dehydration,        check FeNA       Rehydrate with IV fluids Renal US showed no urinary retention or obstruction

## 2022-07-08 NOTE — Assessment & Plan Note (Signed)
Continue crestor 5 mg po qday

## 2022-07-08 NOTE — Assessment & Plan Note (Signed)
-  will replace and repeat in AM,  check magnesium level and replace as needed ° °

## 2022-07-08 NOTE — Assessment & Plan Note (Signed)
Now has improved Continue to monitor no evidence of obstruction

## 2022-07-08 NOTE — Progress Notes (Signed)
PROGRESS NOTE    CAEDEN FOOTS  VQM:[id number] DOB: [birth year] DOA: 07/07/2022 PCP: [name], MD   Brief Narrative: 57 year old lady with a history of prostate cancer Prostate Cancer Anemia, DM type 1, radiation cystitis   Presented with  blood clot in urine Pt was sent to ER with Hg of 6.9 on Thursday has been fatigued lightheaded SOB Now pink urine but had blood clots before  No N/V no abd pain  He received 1 unit of packed RBC without much improvement in his hemoglobin. In the ER hemoglobin 6.3 creatinine 1.7 sodium 127 potassium 3.4 Assessment & Plan:   Principal Problem:   Symptomatic anemia Active Problems:   Type 1 diabetes mellitus (HCC)   Asthma   Prostate cancer ([place])   Hyperlipidemia   Essential hypertension   Hematuria   Hyponatremia   Hypokalemia   AKI (acute kidney injury) ([place])   #1 blood loss anemia/symptomatic anemia from gross hematuria likely related to radiation cystitis with history of prostate malignancy-received 1 unit of packed RBC without improvement in hemoglobin.?  Possibly hemodilution from IV hydration. Additional unit of packed RBC ordered. Follow-up H&H in a.m. He reports the urine is still red but clearing up.  He does not see any clots in his urine today  #2 AKI renal ultrasound showed no urinary retention or obstruction.  Continue IV fluids. Hold ramipril   #3 type 1 diabetes continue long-acting insulin CBG (last 3)  Recent Labs    07/08/22 0106 07/08/22 0353 07/08/22 0738  GLUCAP 349* 278* 138*    #4 prostate cancer followed by Dr. Alinda Money [facility]  #5 essential hypertension-blood pressure soft  hold Norvasc and ramipril  #6 hyponatremia in the setting of dehydration and acute bleed  #7 hypokalemia replete and recheck labs.  Check mag level.  #8 hyperlipidemia on Crestor prior admission   Estimated body mass index is 30.11 kg/m as calculated from the following:   Height as of this encounter: '5\' 10"'$  (1.778 m).    Weight as of this encounter: 95.2 kg.  DVT prophylaxis: None due to hematuria. Code Status: Full Family Communication: Wife at bed side  The patient remains OBS appropriate and will d/c before 2 midnights.   Consultants:  None  Procedures: None  Antimicrobials: NONE  Subjective:  Patient reports he is feeling really weak and dizzy Still with hematuria but improved no clots in the urine reported today Objective: Vitals:   07/08/22 0740 07/08/22 1018 07/08/22 1020 07/08/22 1035  BP: 135/60  130/60 131/65  Pulse: 86  80 77  Resp: '20  17 13  '$ Temp: 97.8 F (36.6 C) 98 F (36.7 C)    TempSrc: Oral Oral    SpO2: 100%  98% 100%  Weight:      Height:        Intake/Output Summary (Last 24 hours) at 07/08/2022 1059 Last data filed at 07/08/2022 0114 Gross per 24 hour  Intake 300 ml  Output --  Net 300 ml   Filed Weights   07/07/22 1915  Weight: 95.2 kg    Examination:  General exam: Appears restless Respiratory system: Clear to auscultation. Respiratory effort normal. Cardiovascular system: S1 & S2 heard, RRR. No JVD, murmurs, rubs, gallops or clicks. No pedal edema. Gastrointestinal system: Abdomen is nondistended, soft and nontender. No organomegaly or masses felt. Normal bowel sounds heard. Central nervous system: Alert and oriented. No focal neurological deficits. Extremities: NO  Edema skin: No rashes, lesions or ulcers Psychiatry: Judgement and  insight appear normal. Mood & affect appropriate.   Data Reviewed: I have personally reviewed following labs and imaging studies  CBC: Recent Labs  Lab 07/07/22 1917 07/08/22 0545  WBC 8.5 8.1  NEUTROABS 6.7 6.2  HGB 6.3* 6.5*  HCT 19.1* 19.8*  MCV 89.7 90.8  PLT 396 160   Basic Metabolic Panel: Recent Labs  Lab 07/07/22 1917  NA 127*  K 3.4*  CL 92*  CO2 22  GLUCOSE 277*  BUN 47*  CREATININE 1.73*  CALCIUM 8.9   GFR: Estimated Creatinine Clearance: 55.9 mL/min (A) (by C-G formula based on SCr of  1.73 mg/dL (H)). Liver Function Tests: Recent Labs  Lab 07/07/22 1917  AST 26  ALT 36  ALKPHOS 206*  BILITOT 0.3  PROT 6.6  ALBUMIN 3.3*   No results for input(s): "LIPASE", "AMYLASE" in the last 168 hours. No results for input(s): "AMMONIA" in the last 168 hours. Coagulation Profile: No results for input(s): "INR", "PROTIME" in the last 168 hours. Cardiac Enzymes: No results for input(s): "CKTOTAL", "CKMB", "CKMBINDEX", "TROPONINI" in the last 168 hours. BNP (last 3 results) No results for input(s): "PROBNP" in the last 8760 hours. HbA1C: No results for input(s): "HGBA1C" in the last 72 hours. CBG: Recent Labs  Lab 07/08/22 0106 07/08/22 0353 07/08/22 0738  GLUCAP 349* 278* 138*   Lipid Profile: No results for input(s): "CHOL", "HDL", "LDLCALC", "TRIG", "CHOLHDL", "LDLDIRECT" in the last 72 hours. Thyroid Function Tests: No results for input(s): "TSH", "T4TOTAL", "FREET4", "T3FREE", "THYROIDAB" in the last 72 hours. Anemia Panel: No results for input(s): "VITAMINB12", "FOLATE", "FERRITIN", "TIBC", "IRON", "RETICCTPCT" in the last 72 hours. Sepsis Labs: No results for input(s): "PROCALCITON", "LATICACIDVEN" in the last 168 hours.  No results found for this or any previous visit (from the past 240 hour(s)).       Radiology Studies: US Renal  Result Date: 07/07/2022 CLINICAL DATA:  Acute renal insufficiency. EXAM: RENAL / URINARY TRACT ULTRASOUND COMPLETE COMPARISON:  CT abdomen pelvis dated 03/21/2022. FINDINGS: Right Kidney: Renal measurements: 11.4 x 6.1 x 6.0 cm = volume: 216 mL. Normal echogenicity. No hydronephrosis or shadowing stone. Left Kidney: Renal measurements: 12.7 x 6.3 x 6.9 cm = volume: 290 mL. Normal echogenicity. No hydronephrosis or shadowing stone. Bladder: There is slight irregularity of the bladder wall which may represent trabeculation related to chronic bladder outlet obstruction. Focal area of nodular protrusion from the right lateral bladder  wall, likely related to underdistention. A bladder lesion is not excluded. Clinical correlation and further evaluation with cystoscopy, if clinically indicated, recommended. Other: The prostate gland is enlarged measuring 6.0 x 3.7 x 3.7 cm. There is median lobe hypertrophy indenting the base of the bladder. IMPRESSION: 1. Unremarkable kidneys. 2. Focal protrusion of the right bladder wall may be related to underdistention. A bladder lesion is not entirely excluded clinical correlation is recommended. 3. Enlarged prostate gland. Electronically Signed   By: Anner Crete M.D.   On: 07/07/2022 22:37        Scheduled Meds:  sodium chloride   Intravenous Once   insulin aspart  0-9 Units Subcutaneous Q4H   insulin glargine-yfgn  20 Units Subcutaneous Daily   [START ON 07/09/2022] rosuvastatin  5 mg Oral Once per day on Mon Thu   Continuous Infusions:  sodium chloride 75 mL/hr at 07/08/22 1016     LOS: 0 days    Time spent: 25 min  Georgette Shell, MD 07/08/2022, 10:59 AM

## 2022-07-08 NOTE — Assessment & Plan Note (Addendum)
Transfuse 1 unit and follow cbc, most likely cause for anemia is hematuria due to radiation cystitis Repeat cbc post transfusion

## 2022-07-08 NOTE — Assessment & Plan Note (Signed)
Mild non contributary

## 2022-07-08 NOTE — Assessment & Plan Note (Signed)
Allow permissive htn for tonight

## 2022-07-08 NOTE — Assessment & Plan Note (Signed)
Would benefit from urology consult in AM  To let them know pt has been admitted

## 2022-07-08 NOTE — Inpatient Diabetes Management (Signed)
Inpatient Diabetes Program Recommendations  AACE/ADA: New Consensus Statement on Inpatient Glycemic Control (2015)  Target Ranges:  Prepandial:   less than 140 mg/dL      Peak postprandial:   less than 180 mg/dL (1-2 hours)      Critically ill patients:  140 - 180 mg/dL   Lab Results  Component Value Date   GLUCAP 138 (H) 07/08/2022   HGBA1C 7.7 (H) 03/21/2022    Review of Glycemic Control  Latest Reference Range & Units 07/08/22 01:06 07/08/22 03:53 07/08/22 07:38  Glucose-Capillary 70 - 99 mg/dL 349 (H) 278 (H) 138 (H)  (H): Data is abnormally high Diabetes history: Type 1 DM Outpatient Diabetes medications: Lyumjev 0-12 units TID, Tresiba 40 units QD Current orders for Inpatient glycemic control: Novolog 0-9 units q4H  Inpatient Diabetes Program Recommendations:    Consider adding Semglee 12 units QD.   Thanks, Bronson Curb, MSN, RNC-OB Diabetes Coordinator [phone] (8a-5p)

## 2022-07-08 NOTE — ED Notes (Signed)
Attempted to check CBG. Per pt, pt is "busy at the moment". Will re-attempt later. Medic aware

## 2022-07-08 NOTE — Assessment & Plan Note (Signed)
In the setting of dehydration and bleeding  Obtain urine electrolytes Rehydrate and follow

## 2022-07-09 DIAGNOSIS — D649 Anemia, unspecified: Secondary | ICD-10-CM | POA: Diagnosis not present

## 2022-07-09 LAB — BPAM RBC
Blood Product Expiration Date: 202402242359
Blood Product Expiration Date: 202402252359
ISSUE DATE / TIME: 202401240102
ISSUE DATE / TIME: 202401240945
Unit Type and Rh: 5100
Unit Type and Rh: 5100

## 2022-07-09 LAB — CBC
HCT: 25.7 % — ABNORMAL LOW (ref 39.0–52.0)
Hemoglobin: 8.4 g/dL — ABNORMAL LOW (ref 13.0–17.0)
MCH: 29.3 pg (ref 26.0–34.0)
MCHC: 32.7 g/dL (ref 30.0–36.0)
MCV: 89.5 fL (ref 80.0–100.0)
Platelets: 458 10*3/uL — ABNORMAL HIGH (ref 150–400)
RBC: 2.87 MIL/uL — ABNORMAL LOW (ref 4.22–5.81)
RDW: 15.1 % (ref 11.5–15.5)
WBC: 10.4 10*3/uL (ref 4.0–10.5)
nRBC: 0 % (ref 0.0–0.2)

## 2022-07-09 LAB — TYPE AND SCREEN
ABO/RH(D): O POS
Antibody Screen: NEGATIVE
Unit division: 0
Unit division: 0

## 2022-07-09 LAB — COMPREHENSIVE METABOLIC PANEL
ALT: 42 U/L (ref 0–44)
AST: 40 U/L (ref 15–41)
Albumin: 2.3 g/dL — ABNORMAL LOW (ref 3.5–5.0)
Alkaline Phosphatase: 286 U/L — ABNORMAL HIGH (ref 38–126)
Anion gap: 11 (ref 5–15)
BUN: 24 mg/dL — ABNORMAL HIGH (ref 6–20)
CO2: 24 mmol/L (ref 22–32)
Calcium: 8.1 mg/dL — ABNORMAL LOW (ref 8.9–10.3)
Chloride: 98 mmol/L (ref 98–111)
Creatinine, Ser: 1.39 mg/dL — ABNORMAL HIGH (ref 0.61–1.24)
GFR, Estimated: 60 mL/min — ABNORMAL LOW (ref >60)
Glucose, Bld: 212 mg/dL — ABNORMAL HIGH (ref 70–99)
Potassium: 3.3 mmol/L — ABNORMAL LOW (ref 3.5–5.1)
Sodium: 133 mmol/L — ABNORMAL LOW (ref 135–145)
Total Bilirubin: 0.6 mg/dL (ref 0.3–1.2)
Total Protein: 6.1 g/dL — ABNORMAL LOW (ref 6.5–8.1)

## 2022-07-09 LAB — GLUCOSE, CAPILLARY
Glucose-Capillary: 174 mg/dL — ABNORMAL HIGH (ref 70–99)
Glucose-Capillary: 178 mg/dL — ABNORMAL HIGH (ref 70–99)
Glucose-Capillary: 211 mg/dL — ABNORMAL HIGH (ref 70–99)
Glucose-Capillary: 250 mg/dL — ABNORMAL HIGH (ref 70–99)

## 2022-07-09 LAB — RETICULOCYTES
Immature Retic Fract: 13.9 % (ref 2.3–15.9)
RBC.: 2.5 MIL/uL — ABNORMAL LOW (ref 4.22–5.81)
Retic Count, Absolute: 15.5 10*3/uL — ABNORMAL LOW (ref 19.0–186.0)
Retic Ct Pct: 0.6 % (ref 0.4–3.1)

## 2022-07-09 LAB — IRON AND TIBC
Iron: 13 ug/dL — ABNORMAL LOW (ref 45–182)
Saturation Ratios: 5 % — ABNORMAL LOW (ref 17.9–39.5)
TIBC: 242 ug/dL — ABNORMAL LOW (ref 250–450)
UIBC: 229 ug/dL

## 2022-07-09 LAB — OSMOLALITY, URINE: Osmolality, Ur: 470 mOsm/kg (ref 300–900)

## 2022-07-09 LAB — HEPATIC FUNCTION PANEL
ALT: 37 U/L (ref 0–44)
AST: 38 U/L (ref 15–41)
Albumin: 2.2 g/dL — ABNORMAL LOW (ref 3.5–5.0)
Alkaline Phosphatase: 255 U/L — ABNORMAL HIGH (ref 38–126)
Bilirubin, Direct: 0.2 mg/dL (ref 0.0–0.2)
Indirect Bilirubin: 0.5 mg/dL (ref 0.3–0.9)
Total Bilirubin: 0.7 mg/dL (ref 0.3–1.2)
Total Protein: 5.4 g/dL — ABNORMAL LOW (ref 6.5–8.1)

## 2022-07-09 LAB — PHOSPHORUS: Phosphorus: 2.5 mg/dL (ref 2.5–4.6)

## 2022-07-09 LAB — LACTIC ACID, PLASMA: Lactic Acid, Venous: 0.9 mmol/L (ref 0.5–1.9)

## 2022-07-09 LAB — TSH: TSH: 1.311 u[IU]/mL (ref 0.350–4.500)

## 2022-07-09 LAB — MAGNESIUM: Magnesium: 1.8 mg/dL (ref 1.7–2.4)

## 2022-07-09 LAB — VITAMIN B12: Vitamin B-12: 2840 pg/mL — ABNORMAL HIGH (ref 180–914)

## 2022-07-09 LAB — FOLATE: Folate: 13.5 ng/mL (ref >5.9)

## 2022-07-09 LAB — FERRITIN: Ferritin: 103 ng/mL (ref 24–336)

## 2022-07-09 LAB — OSMOLALITY: Osmolality: 284 mOsm/kg (ref 275–295)

## 2022-07-09 LAB — CK: Total CK: 18 U/L — ABNORMAL LOW (ref 49–397)

## 2022-07-09 MED ORDER — ENZALUTAMIDE 40 MG PO TABS
160.0000 mg | ORAL_TABLET | Freq: Every day | ORAL | Status: DC
  Administered 2022-07-10 – 2022-07-13 (×4): 160 mg via ORAL

## 2022-07-09 MED ORDER — AMLODIPINE BESYLATE 5 MG PO TABS
5.0000 mg | ORAL_TABLET | Freq: Every day | ORAL | Status: DC
  Administered 2022-07-09 – 2022-07-16 (×7): 5 mg via ORAL
  Filled 2022-07-09 (×7): qty 1

## 2022-07-09 MED ORDER — INSULIN GLARGINE-YFGN 100 UNIT/ML ~~LOC~~ SOLN
25.0000 [IU] | Freq: Every day | SUBCUTANEOUS | Status: DC
  Administered 2022-07-10 – 2022-07-15 (×6): 25 [IU] via SUBCUTANEOUS
  Filled 2022-07-09 (×6): qty 0.25

## 2022-07-09 MED ORDER — SODIUM CHLORIDE 0.9 % IV SOLN
1.0000 g | INTRAVENOUS | Status: DC
  Administered 2022-07-09: 1 g via INTRAVENOUS
  Filled 2022-07-09 (×2): qty 10

## 2022-07-09 MED ORDER — OXYCODONE HCL 5 MG PO TABS: 5.0000 mg | ORAL_TABLET | Freq: Three times a day (TID) | ORAL | Status: DC | PRN

## 2022-07-09 MED ORDER — POTASSIUM CHLORIDE CRYS ER 20 MEQ PO TBCR
40.0000 meq | EXTENDED_RELEASE_TABLET | Freq: Once | ORAL | Status: AC
  Administered 2022-07-09: 40 meq via ORAL
  Filled 2022-07-09: qty 2

## 2022-07-09 NOTE — Progress Notes (Signed)
  Transition of Care [facility]) Screening Note   Patient Details  Name: PAPA PIERCEFIELD Date of Birth: [birth year]   Transition of Care [facility]) CM/SW Contact:    Vassie Moselle, LCSW Phone Number: 07/09/2022, 8:58 AM    Transition of Care Department [facility]) has reviewed patient and no TOC needs have been identified at this time. We will continue to monitor patient advancement through interdisciplinary progression rounds. If new patient transition needs arise, please place a TOC consult.

## 2022-07-09 NOTE — Progress Notes (Signed)
PROGRESS NOTE    [name]  [id number]:[id number] DOB: Dec 10, 1965 DOA: 07/07/2022 PCP: [name], MD   Brief Narrative: 57 year old male with a history of  Prostate Cancer Anemia, DM type 1, radiation cystitis   Presented with  blood clot in urine Pt was sent to ER with Hg of 6.3  fatigued lightheaded SOB  had blood clots  No N/V no abd pain  He received 2 unit of packed RBC with improvement in his hemoglobin. In the ER hemoglobin 6.3 creatinine 1.7 sodium 127 potassium 3.4 Assessment & Plan:   Principal Problem:   Symptomatic anemia Active Problems:   Type 1 diabetes mellitus (HCC)   Asthma   Prostate cancer ([place])   Hyperlipidemia   Essential hypertension   Hematuria   Hyponatremia   Hypokalemia   AKI (acute kidney injury) ([place])   #1 blood loss anemia/symptomatic anemia from gross hematuria likely related to radiation cystitis with history of prostate malignancy-received 2 unit of packed RBC  Hb 8.4 He reports the urine is still red but clearing up.   He does not see any clots in his urine today  #2 AKI renal ultrasound showed no urinary retention or obstruction.  Continue  slow IV fluids. Hold ramipril   #3 type 1 diabetes continue long-acting insulin CBG (last 3)  Recent Labs    07/09/22 0045 07/09/22 0356 07/09/22 0752  GLUCAP 174* 178* 211*     #4 prostate cancer followed by Dr. Alinda Money [facility]  #5 essential hypertension- restart Norvasc hold ramipril  #6 hyponatremia in the setting of dehydration and acute bleed Stable   #7 hypokalemia replete and recheck labs.  Check mag level 1.8  #8 hyperlipidemia on Crestor prior admission  #9 fever spiked temp 102 overnight  Check blood cultures Urine culture  Ua cw uti,patient with symptoms of suprapubic pain and dysuria with history of urological procedure  Start rocephin    Estimated body mass index is 30.11 kg/m as calculated from the following:   Height as of this encounter: '5\' 10"'$  (1.778  m).   Weight as of this encounter: 95.2 kg.  DVT prophylaxis: None due to hematuria. Code Status: Full Family Communication: Wife at bed side  The patient remains OBS appropriate and will d/c before 2 midnights.   Consultants:  None  Procedures: None  Antimicrobials: NONE  Subjective:reports not feeling good   Objective: Vitals:   07/08/22 2027 07/09/22 0047 07/09/22 0432 07/09/22 0754  BP: (!) 145/71 139/60 (!) 163/68 (!) 160/66  Pulse: 85 83 90 93  Resp: (!) 22 20  (!) 21  Temp: 98.6 F (37 C) 98.9 F (37.2 C) 98.1 F (36.7 C) (!) 102.1 F (38.9 C)  TempSrc: Oral  Oral Oral  SpO2: 100% 100% 100% 98%  Weight:      Height:        Intake/Output Summary (Last 24 hours) at 07/09/2022 0912 Last data filed at 07/09/2022 0424 Gross per 24 hour  Intake 145 ml  Output --  Net 145 ml    Filed Weights   07/07/22 1915  Weight: 95.2 kg    Examination:  General exam: Appears restless Respiratory system: Clear to auscultation. Respiratory effort normal. Cardiovascular system: S1 & S2 heard, RRR. No JVD, murmurs, rubs, gallops or clicks. No pedal edema. Gastrointestinal system: Abdomen is nondistended, soft and tender. No organomegaly or masses felt. Normal bowel sounds heard. Central nervous system: Alert and oriented. No focal neurological deficits. Extremities: 1 plus b/l edema  Edema skin: No rashes, lesions or ulcers Psychiatry: Judgement and insight appear normal. Mood & affect appropriate.   Data Reviewed: I have personally reviewed following labs and imaging studies  CBC: Recent Labs  Lab 07/07/22 1917 07/08/22 0545 07/08/22 1454 07/09/22 0514  WBC 8.5 8.1 9.7 10.4  NEUTROABS 6.7 6.2  --   --   HGB 6.3* 6.5* 7.9* 8.4*  HCT 19.1* 19.8* 23.6* 25.7*  MCV 89.7 90.8 88.7 89.5  PLT 396 332 377 458*    Basic Metabolic Panel: Recent Labs  Lab 07/07/22 1917 07/08/22 1445 07/08/22 2358 07/09/22 0514  NA 127* 133*  --  133*  K 3.4* 3.5  --  3.3*  CL  92* 100  --  98  CO2 22 22  --  24  GLUCOSE 277* 256*  --  212*  BUN 47* 33*  --  24*  CREATININE 1.73* 1.28*  --  1.39*  CALCIUM 8.9 7.9*  --  8.1*  MG  --  1.9 1.8  --   PHOS  --  1.7* 2.5  --     GFR: Estimated Creatinine Clearance: 69.6 mL/min (A) (by C-G formula based on SCr of 1.39 mg/dL (H)). Liver Function Tests: Recent Labs  Lab 07/07/22 1917 07/08/22 1445 07/08/22 2358 07/09/22 0514  AST 26 27 38 40  ALT 36 35 37 42  ALKPHOS 206* 247* 255* 286*  BILITOT 0.3 0.5 0.7 0.6  PROT 6.6 5.8* 5.4* 6.1*  ALBUMIN 3.3* 2.2* 2.2* 2.3*    No results for input(s): "LIPASE", "AMYLASE" in the last 168 hours. No results for input(s): "AMMONIA" in the last 168 hours. Coagulation Profile: No results for input(s): "INR", "PROTIME" in the last 168 hours. Cardiac Enzymes: Recent Labs  Lab 07/08/22 2358  CKTOTAL 18*   BNP (last 3 results) No results for input(s): "PROBNP" in the last 8760 hours. HbA1C: No results for input(s): "HGBA1C" in the last 72 hours. CBG: Recent Labs  Lab 07/08/22 1624 07/08/22 1949 07/09/22 0045 07/09/22 0356 07/09/22 0752  GLUCAP 249* 264* 174* 178* 211*    Lipid Profile: No results for input(s): "CHOL", "HDL", "LDLCALC", "TRIG", "CHOLHDL", "LDLDIRECT" in the last 72 hours. Thyroid Function Tests: Recent Labs    07/08/22 2358  TSH 1.311   Anemia Panel: Recent Labs    07/08/22 2358  VITAMINB12 2,840*  FOLATE 13.5  FERRITIN 103  TIBC 242*  IRON 13*  RETICCTPCT 0.6   Sepsis Labs: Recent Labs  Lab 07/08/22 2358  LATICACIDVEN 0.9    No results found for this or any previous visit (from the past 240 hour(s)).       Radiology Studies: US Renal  Result Date: 07/07/2022 CLINICAL DATA:  Acute renal insufficiency. EXAM: RENAL / URINARY TRACT ULTRASOUND COMPLETE COMPARISON:  CT abdomen pelvis dated 03/21/2022. FINDINGS: Right Kidney: Renal measurements: 11.4 x 6.1 x 6.0 cm = volume: 216 mL. Normal echogenicity. No hydronephrosis  or shadowing stone. Left Kidney: Renal measurements: 12.7 x 6.3 x 6.9 cm = volume: 290 mL. Normal echogenicity. No hydronephrosis or shadowing stone. Bladder: There is slight irregularity of the bladder wall which may represent trabeculation related to chronic bladder outlet obstruction. Focal area of nodular protrusion from the right lateral bladder wall, likely related to underdistention. A bladder lesion is not excluded. Clinical correlation and further evaluation with cystoscopy, if clinically indicated, recommended. Other: The prostate gland is enlarged measuring 6.0 x 3.7 x 3.7 cm. There is median lobe hypertrophy indenting the base of the bladder.  IMPRESSION: 1. Unremarkable kidneys. 2. Focal protrusion of the right bladder wall may be related to underdistention. A bladder lesion is not entirely excluded clinical correlation is recommended. 3. Enlarged prostate gland. Electronically Signed   By: Anner Crete M.D.   On: 07/07/2022 22:37        Scheduled Meds:  sodium chloride   Intravenous Once   insulin aspart  0-9 Units Subcutaneous Q4H   insulin glargine-yfgn  20 Units Subcutaneous Daily   potassium chloride  40 mEq Oral Once   rosuvastatin  5 mg Oral Once per day on Mon Thu   Continuous Infusions:     LOS: 1 day    Time spent: 37 min  Georgette Shell, MD 07/09/2022, 9:12 AM

## 2022-07-09 NOTE — Inpatient Diabetes Management (Signed)
Inpatient Diabetes Program Recommendations  AACE/ADA: New Consensus Statement on Inpatient Glycemic Control (2015)  Target Ranges:  Prepandial:   less than 140 mg/dL      Peak postprandial:   less than 180 mg/dL (1-2 hours)      Critically ill patients:  140 - 180 mg/dL   Lab Results  Component Value Date   GLUCAP 211 (H) 07/09/2022   HGBA1C 7.7 (H) 03/21/2022    Review of Glycemic Control  Latest Reference Range & Units 07/09/22 00:45 07/09/22 03:56 07/09/22 07:52  Glucose-Capillary 70 - 99 mg/dL 174 (H) 178 (H) 211 (H)  (H): Data is abnormally high Diabetes history: Type 1 DM Outpatient Diabetes medications: Lyumjev 0-12 units TID, Tresiba 40 units QD Current orders for Inpatient glycemic control: Novolog 0-9 units q4H, semglee 20 units qd   Inpatient Diabetes Program Recommendations:    If to remain inpatient consider further increasing Semglee to 24 units Qd.   Thanks, Bronson Curb, MSN, RNC-OB Diabetes Coordinator [phone] (8a-5p)

## 2022-07-09 NOTE — Plan of Care (Signed)

## 2022-07-10 DIAGNOSIS — D649 Anemia, unspecified: Secondary | ICD-10-CM | POA: Diagnosis not present

## 2022-07-10 LAB — BLOOD CULTURE ID PANEL (REFLEXED) - BCID2

## 2022-07-10 LAB — CBC
HCT: 22.6 % — ABNORMAL LOW (ref 39.0–52.0)
Hemoglobin: 7.4 g/dL — ABNORMAL LOW (ref 13.0–17.0)
MCH: 29.1 pg (ref 26.0–34.0)
MCHC: 32.7 g/dL (ref 30.0–36.0)
MCV: 89 fL (ref 80.0–100.0)
Platelets: 482 10*3/uL — ABNORMAL HIGH (ref 150–400)
RBC: 2.54 MIL/uL — ABNORMAL LOW (ref 4.22–5.81)
RDW: 14.6 % (ref 11.5–15.5)
WBC: 9.6 10*3/uL (ref 4.0–10.5)
nRBC: 0 % (ref 0.0–0.2)

## 2022-07-10 LAB — GASTROINTESTINAL PANEL BY PCR, STOOL (REPLACES STOOL CULTURE)

## 2022-07-10 LAB — GLUCOSE, CAPILLARY
Glucose-Capillary: 126 mg/dL — ABNORMAL HIGH (ref 70–99)
Glucose-Capillary: 137 mg/dL — ABNORMAL HIGH (ref 70–99)
Glucose-Capillary: 181 mg/dL — ABNORMAL HIGH (ref 70–99)
Glucose-Capillary: 259 mg/dL — ABNORMAL HIGH (ref 70–99)
Glucose-Capillary: 291 mg/dL — ABNORMAL HIGH (ref 70–99)
Glucose-Capillary: 160 mg/dL — ABNORMAL HIGH (ref 70–99)

## 2022-07-10 LAB — COMPREHENSIVE METABOLIC PANEL
ALT: 33 U/L (ref 0–44)
AST: 32 U/L (ref 15–41)
Albumin: 2.3 g/dL — ABNORMAL LOW (ref 3.5–5.0)
Alkaline Phosphatase: 345 U/L — ABNORMAL HIGH (ref 38–126)
Anion gap: 12 (ref 5–15)
BUN: 18 mg/dL (ref 6–20)
CO2: 23 mmol/L (ref 22–32)
Calcium: 8.1 mg/dL — ABNORMAL LOW (ref 8.9–10.3)
Chloride: 99 mmol/L (ref 98–111)
Creatinine, Ser: 1.07 mg/dL (ref 0.61–1.24)
GFR, Estimated: >60 mL/min (ref >60)
Glucose, Bld: 136 mg/dL — ABNORMAL HIGH (ref 70–99)
Potassium: 3.3 mmol/L — ABNORMAL LOW (ref 3.5–5.1)
Sodium: 134 mmol/L — ABNORMAL LOW (ref 135–145)
Total Bilirubin: 0.4 mg/dL (ref 0.3–1.2)
Total Protein: 6.1 g/dL — ABNORMAL LOW (ref 6.5–8.1)

## 2022-07-10 LAB — MAGNESIUM: Magnesium: 1.7 mg/dL (ref 1.7–2.4)

## 2022-07-10 LAB — LACTOFERRIN, FECAL, QUALITATIVE: Lactoferrin, Fecal, Qual: NEGATIVE

## 2022-07-10 LAB — PREPARE RBC (CROSSMATCH)

## 2022-07-10 MED ORDER — CEFAZOLIN SODIUM-DEXTROSE 2-4 GM/100ML-% IV SOLN
2.0000 g | Freq: Three times a day (TID) | INTRAVENOUS | Status: DC
  Administered 2022-07-10 – 2022-07-14 (×13): 2 g via INTRAVENOUS
  Filled 2022-07-10 (×14): qty 100

## 2022-07-10 MED ORDER — SODIUM CHLORIDE 0.9% IV SOLUTION: Freq: Once | INTRAVENOUS | Status: AC

## 2022-07-10 NOTE — Consult Note (Signed)
Urology Consult   Physician requesting consult: Dr. Zigmund Daniel  Reason for consult: Hematuria, prostate cancer  History of Present Illness: [name] is a 57 y.o. 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  He has undergone cystoscopy with fulguration and completed hyperbaric oxygen about a week ago.  I did take him for repeat cystoscopy and fulguration about 2 weeks ago and hematuria has now been improved.  Hgb last week in our office was 6.9 and he was not very symptomatic.  He developed increased confusion and weakness earlier this week and went to the ER at [facility].  Hgb was 6.3.  He received 2 units and was admitted to [facility].  Hgb went up to 8.4 but back down to 7.4 today. Still denies much hematuria.  He also was found to have bacteremia from an unknown source.   Past Medical History:  Diagnosis Date   Asthma    Cancer ([place])    prostate cancer    Diabetes mellitus without complication ([place])    History of bronchitis    Hypertension    Prostate cancer ([place])    Wears glasses     Past Surgical History:  Procedure Laterality Date   CYSTOSCOPY WITH FULGERATION N/A 03/21/2022   Procedure: CYSTOSCOPY WITH FULGERATION;  Surgeon: Janith Lima, MD;  Location: [facility];  Service: Urology;  Laterality: N/A;   CYSTOSCOPY WITH FULGERATION N/A 06/22/2022   Procedure: CYSTOSCOPY WITH FULGERATION;  Surgeon: Raynelle Bring, MD;  Location: [facility];  Service: Urology;  Laterality: N/A;  30 MINS FOR CASE   EYE SURGERY     laser eye surgery bilat    left groin hernia      LYMPHADENECTOMY Bilateral 09/02/2015   Procedure: BILATERAL LYMPHADENECTOMY;  Surgeon: Raynelle Bring, MD;  Location: [facility];  Service: Urology;  Laterality: Bilateral;   ROBOT ASSISTED LAPAROSCOPIC RADICAL PROSTATECTOMY N/A 09/02/2015   Procedure: XI ROBOTIC  ASSISTED LAPAROSCOPIC RADICAL PROSTATECTOMY LEVEL 2;  Surgeon: Raynelle Bring, MD;  Location: [facility];  Service: Urology;  Laterality: N/A;   schwannoma     removed approx 16 to 18 years ago    TONSILLECTOMY      Medications:  Home meds:  No current facility-administered medications on file prior to encounter.   Current Outpatient Medications on File Prior to Encounter  Medication Sig Dispense Refill   albuterol (VENTOLIN HFA) 108 (90 Base) MCG/ACT inhaler Inhale 2 puffs into the lungs every 6 (six) hours as needed for wheezing or shortness of breath.     ALPRAZolam (XANAX) 0.25 MG tablet Take 0.25 mg by mouth 3 (three) times daily as needed for anxiety.     amLODipine (NORVASC) 5 MG tablet Take 5 mg by mouth daily.     LYUMJEV TEMPO PEN 100 UNIT/ML SOPN Inject 0-12 Units into the skin as directed. Sliding Scale     oxyCODONE (ROXICODONE) 5 MG immediate release tablet Take 1 tablet (5 mg total) by mouth every 8 (eight) hours as needed. (Patient taking differently: Take 5 mg by mouth every 8 (eight) hours as needed for severe pain.) 20 tablet 0   ramipril (ALTACE) 10 MG capsule Take 10 mg by mouth daily.     rosuvastatin (CRESTOR) 5 MG tablet Take 5 mg by mouth 2 (two) times a week.     TRESIBA FLEXTOUCH 200 UNIT/ML SOPN Inject 40 Units into the skin every  morning.     XTANDI 40 MG tablet Take 160 mg by mouth at bedtime.     Continuous Blood Gluc Sensor (FREESTYLE LIBRE 2 SENSOR) MISC USE TO MONITOR CBG EVERY 14 DAYS AS DIRECTED     Insulin Pen Needle (B-D UF III MINI PEN NEEDLES) 31G X 5 MM MISC USE TO INJECT INSULIN 4 TIMES DAILY E10.9     Insulin Syringe-Needle U-100 (B-D INS SYRINGE 0.5CC/31GX5/16) 31G X 5/16" 0.5 ML MISC use 1 syringe as directed (does 5 injections a day       Scheduled Meds:  amLODipine  5 mg Oral Daily   enzalutamide  160 mg Oral QHS   insulin aspart  0-9 Units Subcutaneous Q4H   insulin glargine-yfgn  25 Units Subcutaneous Daily   rosuvastatin  5 mg Oral Once  per day on Mon Thu   Continuous Infusions:   ceFAZolin (ANCEF) IV 2 g (07/10/22 1338)   PRN Meds:.acetaminophen **OR** acetaminophen, albuterol, ALPRAZolam, HYDROcodone-acetaminophen, oxyCODONE  Allergies: No Known Allergies  History reviewed. No pertinent family history.  Social History:  reports that he quit smoking about 24 years ago. His smoking use included cigarettes. He has a 3.50 pack-year smoking history. He has never used smokeless tobacco. He reports current alcohol use. He reports that he does not use drugs.  ROS: A complete review of systems was performed.  All systems are negative except for pertinent findings as noted.  Physical Exam:  Vital signs in last 24 hours: Temp:  [98.5 F (36.9 C)-99.7 F (37.6 C)] 99.7 F (37.6 C) (01/26 1455) Pulse Rate:  [87-94] 89 (01/26 1455) Resp:  [18-31] 27 (01/26 1457) BP: (145-163)/(61-68) 145/68 (01/26 1455) SpO2:  [99 %-100 %] 99 % (01/26 1455) Constitutional:  Alert and oriented, No acute distress Cardiovascular: No JVD Respiratory: Normal respiratory effort Neurologic: Grossly intact, no focal deficits Psychiatric: Normal mood and affect  Laboratory Data:  Recent Labs    07/07/22 1917 07/08/22 0545 07/08/22 1454 07/09/22 0514 07/10/22 0525  WBC 8.5 8.1 9.7 10.4 9.6  HGB 6.3* 6.5* 7.9* 8.4* 7.4*  HCT 19.1* 19.8* 23.6* 25.7* 22.6*  PLT 396 332 377 458* 482*    Recent Labs    07/07/22 1917 07/08/22 1445 07/09/22 0514 07/10/22 0525  NA 127* 133* 133* 134*  K 3.4* 3.5 3.3* 3.3*  CL 92* 100 98 99  GLUCOSE 277* 256* 212* 136*  BUN 47* 33* 24* 18  CALCIUM 8.9 7.9* 8.1* 8.1*  CREATININE 1.73* 1.28* 1.39* 1.07     Results for orders placed or performed during the hospital encounter of 07/07/22 (from the past 24 hour(s))  Glucose, capillary     Status: Abnormal   Collection Time: 07/09/22  8:00 PM  Result Value Ref Range   Glucose-Capillary 291 (H) 70 - 99 mg/dL  Glucose, capillary     Status: Abnormal    Collection Time: 07/10/22 12:07 AM  Result Value Ref Range   Glucose-Capillary 181 (H) 70 - 99 mg/dL  Glucose, capillary     Status: Abnormal   Collection Time: 07/10/22  4:30 AM  Result Value Ref Range   Glucose-Capillary 126 (H) 70 - 99 mg/dL  CBC     Status: Abnormal   Collection Time: 07/10/22  5:25 AM  Result Value Ref Range   WBC 9.6 4.0 - 10.5 K/uL   RBC 2.54 (L) 4.22 - 5.81 MIL/uL   Hemoglobin 7.4 (L) 13.0 - 17.0 g/dL   HCT 22.6 (L) 39.0 - 52.0 %  MCV 89.0 80.0 - 100.0 fL   MCH 29.1 26.0 - 34.0 pg   MCHC 32.7 30.0 - 36.0 g/dL   RDW 14.6 11.5 - 15.5 %   Platelets 482 (H) 150 - 400 K/uL   nRBC 0.0 0.0 - 0.2 %  Comprehensive metabolic panel     Status: Abnormal   Collection Time: 07/10/22  5:25 AM  Result Value Ref Range   Sodium 134 (L) 135 - 145 mmol/L   Potassium 3.3 (L) 3.5 - 5.1 mmol/L   Chloride 99 98 - 111 mmol/L   CO2 23 22 - 32 mmol/L   Glucose, Bld 136 (H) 70 - 99 mg/dL   BUN 18 6 - 20 mg/dL   Creatinine, Ser 1.07 0.61 - 1.24 mg/dL   Calcium 8.1 (L) 8.9 - 10.3 mg/dL   Total Protein 6.1 (L) 6.5 - 8.1 g/dL   Albumin 2.3 (L) 3.5 - 5.0 g/dL   AST 32 15 - 41 U/L   ALT 33 0 - 44 U/L   Alkaline Phosphatase 345 (H) 38 - 126 U/L   Total Bilirubin 0.4 0.3 - 1.2 mg/dL   GFR, Estimated >60 >60 mL/min   Anion gap 12 5 - 15  Magnesium     Status: None   Collection Time: 07/10/22  5:25 AM  Result Value Ref Range   Magnesium 1.7 1.7 - 2.4 mg/dL  Glucose, capillary     Status: Abnormal   Collection Time: 07/10/22  7:29 AM  Result Value Ref Range   Glucose-Capillary 137 (H) 70 - 99 mg/dL  Gastrointestinal Panel by PCR , Stool     Status: None   Collection Time: 07/10/22  9:22 AM   Specimen: STOOL  Result Value Ref Range   Campylobacter species NOT DETECTED NOT DETECTED   Plesimonas shigelloides NOT DETECTED NOT DETECTED   Salmonella species NOT DETECTED NOT DETECTED   Yersinia enterocolitica NOT DETECTED NOT DETECTED   Vibrio species NOT DETECTED NOT DETECTED    Vibrio cholerae NOT DETECTED NOT DETECTED   Enteroaggregative E coli (EAEC) NOT DETECTED NOT DETECTED   Enteropathogenic E coli (EPEC) NOT DETECTED NOT DETECTED   Enterotoxigenic E coli (ETEC) NOT DETECTED NOT DETECTED   Shiga like toxin producing E coli (STEC) NOT DETECTED NOT DETECTED   Shigella/Enteroinvasive E coli (EIEC) NOT DETECTED NOT DETECTED   Cryptosporidium NOT DETECTED NOT DETECTED   Cyclospora cayetanensis NOT DETECTED NOT DETECTED   Entamoeba histolytica NOT DETECTED NOT DETECTED   Giardia lamblia NOT DETECTED NOT DETECTED   Adenovirus F40/41 NOT DETECTED NOT DETECTED   Astrovirus NOT DETECTED NOT DETECTED   Norovirus GI/GII NOT DETECTED NOT DETECTED   Rotavirus A NOT DETECTED NOT DETECTED   Sapovirus (I, II, IV, and V) NOT DETECTED NOT DETECTED  Lactoferrin, Fecal, Qualitative     Status: None   Collection Time: 07/10/22  9:23 AM  Result Value Ref Range   Lactoferrin, Fecal, Qual NEGATIVE NEGATIVE  Prepare RBC (crossmatch)     Status: None   Collection Time: 07/10/22 10:06 AM  Result Value Ref Range   Order Confirmation      ORDER PROCESSED BY BLOOD BANK Performed at [facility], [address] [place], [place] [address]   Type and screen [place]     Status: None (Preliminary result)   Collection Time: 07/10/22 10:06 AM  Result Value Ref Range   ABO/RH(D) O POS    Antibody Screen NEG    Sample Expiration 07/13/2022,2359  Unit Number [id number]    Blood Component Type RBC LR PHER1    Unit division 00    Status of Unit ISSUED    Transfusion Status OK TO TRANSFUSE    Crossmatch Result      Compatible Performed at [facility] [address], [place], [place] [address]   Glucose, capillary     Status: Abnormal   Collection Time: 07/10/22 11:05 AM  Result Value Ref Range   Glucose-Capillary 160 (H) 70 - 99 mg/dL   Recent Results (from the past 240 hour(s))  Culture, blood (Routine X 2) w  Reflex to ID Panel     Status: None (Preliminary result)   Collection Time: 07/09/22 10:23 AM   Specimen: BLOOD LEFT ARM  Result Value Ref Range Status   Specimen Description   Final    BLOOD LEFT ARM Performed at [facility] [address], [place], [place] [address]    Special Requests   Final    BOTTLES DRAWN AEROBIC AND ANAEROBIC Blood Culture adequate volume Performed at [facility] [address] [place], [place] [address]    Culture  Setup Time   Final    [facility] POSITIVE COCCI AEROBIC BOTTLE ONLY PHARMD M. Moreen Fowler 07/10/22 @ 0610 BY AB Performed at [facility], [place] [address] [place], [place] [address]    Culture GRAM POSITIVE COCCI  Final   Report Status PENDING  Incomplete  Culture, blood (Routine X 2) w Reflex to ID Panel     Status: None (Preliminary result)   Collection Time: 07/09/22 10:23 AM   Specimen: BLOOD  Result Value Ref Range Status   Specimen Description   Final    BLOOD BLOOD RIGHT HAND Performed at [facility] [address], [place], [place] [address]    Special Requests   Final    BOTTLES DRAWN AEROBIC ONLY Blood Culture adequate volume Performed at [facility] [address] [place], [place] [address]    Culture  Setup Time   Final    GRAM POSITIVE COCCI IN CLUSTERS AEROBIC BOTTLE ONLY CRITICAL VALUE NOTED.  VALUE IS CONSISTENT WITH PREVIOUSLY REPORTED AND CALLED VALUE. Performed at [facility], [place] [address], [place], [place] [address]    Culture GRAM POSITIVE COCCI  Final   Report Status PENDING  Incomplete  Blood Culture ID Panel (Reflexed)     Status: Abnormal   Collection Time: 07/09/22 10:23 AM  Result Value Ref Range Status   Enterococcus faecalis NOT DETECTED NOT DETECTED Final   Enterococcus Faecium NOT DETECTED NOT DETECTED Final   Listeria monocytogenes NOT DETECTED NOT DETECTED Final   Staphylococcus species DETECTED (A) NOT DETECTED Final     Comment: CRITICAL RESULT CALLED TO, READ BACK BY AND VERIFIED WITH: PHARMD M. SWAYNE 07/10/22 @ 0610 BY AB    Staphylococcus aureus (BCID) DETECTED (A) NOT DETECTED Final    Comment: CRITICAL RESULT CALLED TO, READ BACK BY AND VERIFIED WITH: PHARMD M. SWAYNE 07/10/22 @ 0610 BY AB    Staphylococcus epidermidis NOT DETECTED NOT DETECTED Final   Staphylococcus lugdunensis NOT DETECTED NOT DETECTED Final   Streptococcus species NOT DETECTED NOT DETECTED Final   Streptococcus agalactiae NOT DETECTED NOT DETECTED Final   Streptococcus pneumoniae NOT DETECTED NOT DETECTED Final   Streptococcus pyogenes NOT DETECTED NOT DETECTED Final   A.calcoaceticus-baumannii NOT DETECTED NOT DETECTED Final   Bacteroides fragilis NOT DETECTED NOT DETECTED Final  Enterobacterales NOT DETECTED NOT DETECTED Final   Enterobacter cloacae complex NOT DETECTED NOT DETECTED Final   Escherichia coli NOT DETECTED NOT DETECTED Final   Klebsiella aerogenes NOT DETECTED NOT DETECTED Final   Klebsiella oxytoca NOT DETECTED NOT DETECTED Final   Klebsiella pneumoniae NOT DETECTED NOT DETECTED Final   Proteus species NOT DETECTED NOT DETECTED Final   Salmonella species NOT DETECTED NOT DETECTED Final   Serratia marcescens NOT DETECTED NOT DETECTED Final   Haemophilus influenzae NOT DETECTED NOT DETECTED Final   Neisseria meningitidis NOT DETECTED NOT DETECTED Final   Pseudomonas aeruginosa NOT DETECTED NOT DETECTED Final   Stenotrophomonas maltophilia NOT DETECTED NOT DETECTED Final   Candida albicans NOT DETECTED NOT DETECTED Final   Candida auris NOT DETECTED NOT DETECTED Final   Candida glabrata NOT DETECTED NOT DETECTED Final   Candida krusei NOT DETECTED NOT DETECTED Final   Candida parapsilosis NOT DETECTED NOT DETECTED Final   Candida tropicalis NOT DETECTED NOT DETECTED Final   Cryptococcus neoformans/gattii NOT DETECTED NOT DETECTED Final   Meth resistant mecA/C and MREJ NOT DETECTED NOT DETECTED Final     Comment: Performed at [facility], [place] [address], [place], [place] [address]  Urine Culture (for pregnant, neutropenic or urologic patients or patients with an indwelling urinary catheter)     Status: Abnormal (Preliminary result)   Collection Time: 07/09/22 11:20 AM   Specimen: Urine, Clean Catch  Result Value Ref Range Status   Specimen Description   Final    URINE, CLEAN CATCH Performed at [facility], [place] [address] [place], [place] [address]    Special Requests   Final    NONE Performed at [facility], [place] [address], [place], [place] [address]    Culture (A)  Final    >=100,000 COLONIES/mL STAPHYLOCOCCUS AUREUS >=100,000 COLONIES/mL ENTEROCOCCUS FAECALIS SUSCEPTIBILITIES TO FOLLOW Performed at [facility], [place] [address], [place], [place] [address]    Report Status PENDING  Incomplete  Gastrointestinal Panel by PCR , Stool     Status: None   Collection Time: 07/10/22  9:22 AM   Specimen: STOOL  Result Value Ref Range Status   Campylobacter species NOT DETECTED NOT DETECTED Final   Plesimonas shigelloides NOT DETECTED NOT DETECTED Final   Salmonella species NOT DETECTED NOT DETECTED Final   Yersinia enterocolitica NOT DETECTED NOT DETECTED Final   Vibrio species NOT DETECTED NOT DETECTED Final   Vibrio cholerae NOT DETECTED NOT DETECTED Final   Enteroaggregative E coli (EAEC) NOT DETECTED NOT DETECTED Final   Enteropathogenic E coli (EPEC) NOT DETECTED NOT DETECTED Final   Enterotoxigenic E coli (ETEC) NOT DETECTED NOT DETECTED Final   Shiga like toxin producing E coli (STEC) NOT DETECTED NOT DETECTED Final   Shigella/Enteroinvasive E coli (EIEC) NOT DETECTED NOT DETECTED Final   Cryptosporidium NOT DETECTED NOT DETECTED Final   Cyclospora cayetanensis NOT DETECTED NOT DETECTED Final   Entamoeba histolytica NOT DETECTED NOT DETECTED Final   Giardia lamblia NOT DETECTED NOT DETECTED Final   Adenovirus F40/41 NOT  DETECTED NOT DETECTED Final   Astrovirus NOT DETECTED NOT DETECTED Final   Norovirus GI/GII NOT DETECTED NOT DETECTED Final   Rotavirus A NOT DETECTED NOT DETECTED Final   Sapovirus (I, II, IV, and V) NOT DETECTED NOT DETECTED Final    Comment: Performed at [facility], [address] [place], [place] [address]    Renal Function: Recent Labs    07/07/22 1917 07/08/22  1445 07/09/22 0514 07/10/22 0525  CREATININE 1.73* 1.28* 1.39* 1.07   Estimated Creatinine Clearance: 90.4 mL/min (by C-G formula based on SCr of 1.07 mg/dL).  Radiologic Imaging: No results found.  I independently reviewed the above imaging studies.  Impression/Recommendation Hematuria: Secondary to radiation cystitis.  Currently, not with much active bleeding.  Would recommend not placing a catheter as this may cause more bleeding due to irritation of bladder mucosa.  If he starts having increased active bleeding, will place hematuria 3 way catheter and begin Alum solution irrigation. Prostate cancer: Continue Xtandi 160 mg daily.  He is current with his ADT.  Dutch Gray 07/10/2022, 5:17 PM    Pryor Curia MD  CC: Dr. Zigmund Daniel

## 2022-07-10 NOTE — Progress Notes (Signed)
PROGRESS NOTE    [name]  JSE:[id number] DOB: 18-Jul-1965 DOA: 07/07/2022 PCP: [name], MD   Brief Narrative: 57 year old male with a history of  Prostate Cancer Anemia, DM type 1, radiation cystitis   Presented with  blood clot in urine Pt was sent to ER with Hg of 6.3  fatigued lightheaded SOB  had blood clots  No N/V no abd pain  He received 2 unit of packed RBC with improvement in his hemoglobin. In the ER hemoglobin 6.3 creatinine 1.7 sodium 127 potassium 3.4 Bcid mssa 1/3 Hb 7.4  Assessment & Plan:   Principal Problem:   Symptomatic anemia Active Problems:   Type 1 diabetes mellitus (HCC)   Asthma   Prostate cancer (HCC)   Hyperlipidemia   Essential hypertension   Hematuria   Hyponatremia   Hypokalemia   AKI (acute kidney injury) ([place])   #1 blood loss anemia/symptomatic anemia from gross hematuria likely related to radiation cystitis with history of prostate malignancy-received 2 unit of packed RBC  Hb 7.4 Transfuse 1 unit today He reports the urine is still red but clearing up.   He does not see any clots in his urine today  #2 AKI - resolved renal ultrasound showed no urinary retention or obstruction.   Hold ramipril 1 more day   #3 type 1 diabetes continue long-acting insulin CBG (last 3)  Recent Labs    07/10/22 0007 07/10/22 0430 07/10/22 0729  GLUCAP 181* 126* 137*     #4 prostate cancer followed by Dr. Alinda Money [facility]  #5 essential hypertension- restart Norvasc hold ramipril  #6 hyponatremia in the setting of dehydration and acute bleed Stable 134  #7 hypokalemia replete and recheck labs.  Check mag level 1.8  #8 hyperlipidemia on Crestor prior admission  #9 mssa bacteremia/uti -ancef started 1/26 Id consulted Echo r/o veg  Estimated body mass index is 30.11 kg/m as calculated from the following:   Height as of this encounter: '5\' 10"'$  (1.778 m).   Weight as of this encounter: 95.2 kg.  DVT prophylaxis: None due to  hematuria. Code Status: Full Family Communication: Wife at bed side  Consultants:  None  Procedures: None  Antimicrobials: NONE  Subjective:  Feels better but not back to baseline Wife by the bedside   Objective: Vitals:   07/09/22 1217 07/09/22 1607 07/09/22 2003 07/10/22 0432  BP: (!) 163/72 (!) 160/67 (!) 153/61 (!) 153/62  Pulse: 83 84 88 87  Resp: '17 19 20 20  '$ Temp: 98.2 F (36.8 C) 98.5 F (36.9 C) 99 F (37.2 C) 99.1 F (37.3 C)  TempSrc: Oral Oral  Oral  SpO2: 100% 100% 100% 100%  Weight:      Height:        Intake/Output Summary (Last 24 hours) at 07/10/2022 1250 Last data filed at 07/09/2022 2106 Gross per 24 hour  Intake 220.91 ml  Output --  Net 220.91 ml    Filed Weights   07/07/22 1915  Weight: 95.2 kg    Examination:  General exam: Appears in nad  Respiratory system: Clear to auscultation. Respiratory effort normal. Cardiovascular system: S1 & S2 heard, RRR. No JVD, murmurs, rubs, gallops or clicks. No pedal edema. Gastrointestinal system: Abdomen is nondistended, soft and tender. No organomegaly or masses felt. Normal bowel sounds heard. Central nervous system: Alert and oriented. No focal neurological deficits. Extremities: 1 plus b/l edema  Edema skin: No rashes, lesions or ulcers Psychiatry: Judgement and insight appear normal. Mood &  affect appropriate.   Data Reviewed: I have personally reviewed following labs and imaging studies  CBC: Recent Labs  Lab 07/07/22 1917 07/08/22 0545 07/08/22 1454 07/09/22 0514 07/10/22 0525  WBC 8.5 8.1 9.7 10.4 9.6  NEUTROABS 6.7 6.2  --   --   --   HGB 6.3* 6.5* 7.9* 8.4* 7.4*  HCT 19.1* 19.8* 23.6* 25.7* 22.6*  MCV 89.7 90.8 88.7 89.5 89.0  PLT 396 332 377 458* 482*    Basic Metabolic Panel: Recent Labs  Lab 07/07/22 1917 07/08/22 1445 07/08/22 2358 07/09/22 0514 07/10/22 0525  NA 127* 133*  --  133* 134*  K 3.4* 3.5  --  3.3* 3.3*  CL 92* 100  --  98 99  CO2 22 22  --  24 23   GLUCOSE 277* 256*  --  212* 136*  BUN 47* 33*  --  24* 18  CREATININE 1.73* 1.28*  --  1.39* 1.07  CALCIUM 8.9 7.9*  --  8.1* 8.1*  MG  --  1.9 1.8  --  1.7  PHOS  --  1.7* 2.5  --   --     GFR: Estimated Creatinine Clearance: 90.4 mL/min (by C-G formula based on SCr of 1.07 mg/dL). Liver Function Tests: Recent Labs  Lab 07/07/22 1917 07/08/22 1445 07/08/22 2358 07/09/22 0514 07/10/22 0525  AST 26 27 38 40 32  ALT 36 35 37 42 33  ALKPHOS 206* 247* 255* 286* 345*  BILITOT 0.3 0.5 0.7 0.6 0.4  PROT 6.6 5.8* 5.4* 6.1* 6.1*  ALBUMIN 3.3* 2.2* 2.2* 2.3* 2.3*    No results for input(s): "LIPASE", "AMYLASE" in the last 168 hours. No results for input(s): "AMMONIA" in the last 168 hours. Coagulation Profile: No results for input(s): "INR", "PROTIME" in the last 168 hours. Cardiac Enzymes: Recent Labs  Lab 07/08/22 2358  CKTOTAL 18*    BNP (last 3 results) No results for input(s): "PROBNP" in the last 8760 hours. HbA1C: No results for input(s): "HGBA1C" in the last 72 hours. CBG: Recent Labs  Lab 07/09/22 1605 07/09/22 2000 07/10/22 0007 07/10/22 0430 07/10/22 0729  GLUCAP 259* 291* 181* 126* 137*    Lipid Profile: No results for input(s): "CHOL", "HDL", "LDLCALC", "TRIG", "CHOLHDL", "LDLDIRECT" in the last 72 hours. Thyroid Function Tests: Recent Labs    07/08/22 2358  TSH 1.311    Anemia Panel: Recent Labs    07/08/22 2358  VITAMINB12 2,840*  FOLATE 13.5  FERRITIN 103  TIBC 242*  IRON 13*  RETICCTPCT 0.6    Sepsis Labs: Recent Labs  Lab 07/08/22 2358  LATICACIDVEN 0.9     Recent Results (from the past 240 hour(s))  Culture, blood (Routine X 2) w Reflex to ID Panel     Status: None (Preliminary result)   Collection Time: 07/09/22 10:23 AM   Specimen: BLOOD LEFT ARM  Result Value Ref Range Status   Specimen Description   Final    BLOOD LEFT ARM Performed at [facility] [address] [place], [place] [address]    Special  Requests   Final    BOTTLES DRAWN AEROBIC AND ANAEROBIC Blood Culture adequate volume Performed at [facility] [address], [place], [place] [address]    Culture  Setup Time   Final    [facility] POSITIVE COCCI AEROBIC BOTTLE ONLY PHARMD M. Moreen Fowler 07/10/22 @ 0610 BY AB Performed at [facility], [place] [address] [place], [place] [address]  Culture GRAM POSITIVE COCCI  Final   Report Status PENDING  Incomplete  Culture, blood (Routine X 2) w Reflex to ID Panel     Status: None (Preliminary result)   Collection Time: 07/09/22 10:23 AM   Specimen: BLOOD  Result Value Ref Range Status   Specimen Description   Final    BLOOD BLOOD RIGHT HAND Performed at [facility] [address] [place], [place] [address]    Special Requests   Final    BOTTLES DRAWN AEROBIC ONLY Blood Culture adequate volume Performed at [facility] [address] [place], [place] [address]    Culture   Final    NO GROWTH < 24 HOURS Performed at [facility] [address] [place], [place] [address]    Report Status PENDING  Incomplete  Blood Culture ID Panel (Reflexed)     Status: Abnormal   Collection Time: 07/09/22 10:23 AM  Result Value Ref Range Status   Enterococcus faecalis NOT DETECTED NOT DETECTED Final   Enterococcus Faecium NOT DETECTED NOT DETECTED Final   Listeria monocytogenes NOT DETECTED NOT DETECTED Final   Staphylococcus species DETECTED (A) NOT DETECTED Final    Comment: CRITICAL RESULT CALLED TO, READ BACK BY AND VERIFIED WITH: PHARMD M. SWAYNE 07/10/22 @ 0610 BY AB    Staphylococcus aureus (BCID) DETECTED (A) NOT DETECTED Final    Comment: CRITICAL RESULT CALLED TO, READ BACK BY AND VERIFIED WITH: PHARMD M. SWAYNE 07/10/22 @ 0610 BY AB    Staphylococcus epidermidis NOT DETECTED NOT DETECTED Final   Staphylococcus lugdunensis NOT DETECTED NOT DETECTED Final   Streptococcus species NOT DETECTED NOT DETECTED Final    Streptococcus agalactiae NOT DETECTED NOT DETECTED Final   Streptococcus pneumoniae NOT DETECTED NOT DETECTED Final   Streptococcus pyogenes NOT DETECTED NOT DETECTED Final   A.calcoaceticus-baumannii NOT DETECTED NOT DETECTED Final   Bacteroides fragilis NOT DETECTED NOT DETECTED Final   Enterobacterales NOT DETECTED NOT DETECTED Final   Enterobacter cloacae complex NOT DETECTED NOT DETECTED Final   Escherichia coli NOT DETECTED NOT DETECTED Final   Klebsiella aerogenes NOT DETECTED NOT DETECTED Final   Klebsiella oxytoca NOT DETECTED NOT DETECTED Final   Klebsiella pneumoniae NOT DETECTED NOT DETECTED Final   Proteus species NOT DETECTED NOT DETECTED Final   Salmonella species NOT DETECTED NOT DETECTED Final   Serratia marcescens NOT DETECTED NOT DETECTED Final   Haemophilus influenzae NOT DETECTED NOT DETECTED Final   Neisseria meningitidis NOT DETECTED NOT DETECTED Final   Pseudomonas aeruginosa NOT DETECTED NOT DETECTED Final   Stenotrophomonas maltophilia NOT DETECTED NOT DETECTED Final   Candida albicans NOT DETECTED NOT DETECTED Final   Candida auris NOT DETECTED NOT DETECTED Final   Candida glabrata NOT DETECTED NOT DETECTED Final   Candida krusei NOT DETECTED NOT DETECTED Final   Candida parapsilosis NOT DETECTED NOT DETECTED Final   Candida tropicalis NOT DETECTED NOT DETECTED Final   Cryptococcus neoformans/gattii NOT DETECTED NOT DETECTED Final   Meth resistant mecA/C and MREJ NOT DETECTED NOT DETECTED Final    Comment: Performed at [facility], [address] [place], [place] [address]         Radiology Studies: No results found.      Scheduled Meds:  sodium chloride   Intravenous Once   amLODipine  5 mg Oral Daily   enzalutamide  160 mg Oral QHS   insulin aspart  0-9 Units Subcutaneous Q4H   insulin glargine-yfgn  25  Units Subcutaneous Daily   rosuvastatin  5 mg Oral Once per day on Mon Thu   Continuous Infusions:   ceFAZolin (ANCEF) IV 2 g  (07/10/22 0735)      LOS: 2 days    Time spent: 58 min  Georgette Shell, MD 07/10/2022, 12:50 PM

## 2022-07-10 NOTE — Consult Note (Addendum)
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  #MSSA bacteremia #Blood Loss anemia #Prostate Ca with Hx radiation cystitis -Pt presented w/o fever or leukocytosis. Fevered overnight on 1/24 which prompted blood Cx to be drawn. -1/23 renal U/S showedfocal protrusion of right bladder may be related to underdistension, bladder lesion not entirely excluded. -1/25 blood Cx+ MSSA -Since admission he has recieved PRBC but don't see any signs of phlebitis. Pt reports he has been feeling out of breath for the last couple days. Etiology of bacteremia is unclear.Given recent surgical instrumentation(cystoscopy and fulguration), it could have been a port of entry.  Recommendations:  -Start cefazolin -Repeat blood Cx to ensure clearance -TTE -Exchange PIV(RUE) -Urology follwing  Dr. Linus Salmons will be covering this weekend.  Microbiology:   Antibiotics: Cefazolin 1/26 Ceftriaxone 1/25  Cultures: Blood 1/25 1/2 MSSA Urine 1/25 p    HPI: [name] is a 57 y.o. male with prostate cancer SP radical prostatomy, radiation c/b radiation cystitis , anemia, DM1, HTN presented with blood clot in urine, sent to ED with Hgb 6.9 admitted for anemia. Pt recieved PRBC. ID engaged as blood Cx+ MSSA.   Review of Systems: Review of Systems  All other systems reviewed and are  negative.   Past Medical History:  Diagnosis Date   Asthma    Cancer ([place])    prostate cancer    Diabetes mellitus without complication ([place])    History of bronchitis    Hypertension    Prostate cancer ([place])    Wears glasses     Social History   Tobacco Use   Smoking status: Former    Packs/day: 0.50    Years: 7.00    Total pack years: 3.50    Types: Cigarettes    Quit date: 06/15/1998    Years since quitting: 24.0   Smokeless tobacco: Never  Vaping Use   Vaping Use: Never used  Substance Use Topics   Alcohol use: Yes    Comment: beer daily Monday through Friday 5 beers through the weekends   Drug use: No    History reviewed. No pertinent family history. Scheduled Meds:  sodium chloride   Intravenous Once   amLODipine  5 mg Oral Daily   enzalutamide  160 mg Oral QHS   insulin aspart  0-9 Units Subcutaneous Q4H   insulin glargine-yfgn  25 Units Subcutaneous Daily   rosuvastatin  5 mg Oral Once per day on Mon Thu   Continuous Infusions:   ceFAZolin (ANCEF) IV 2 g (07/10/22 0735)   PRN Meds:.acetaminophen **OR** acetaminophen, albuterol, ALPRAZolam, HYDROcodone-acetaminophen, oxyCODONE No Known Allergies  OBJECTIVE: Blood pressure (!) 153/62, pulse 87, temperature 99.1 F (37.3 C), temperature source Oral, resp. rate 20, height '5\' 10"'$  (1.778 m), weight 95.2 kg, SpO2 100 %.  Physical Exam Constitutional:      General: He is not in acute distress.    Appearance: He is normal weight. He is not toxic-appearing.  HENT:     Head: Normocephalic and atraumatic.     Right Ear: External ear normal.     Left Ear: External ear normal.     Nose: No congestion or rhinorrhea.     Mouth/Throat:     Mouth: Mucous membranes are moist.     Pharynx: Oropharynx is clear.  Eyes:     Extraocular Movements: Extraocular movements intact.     Conjunctiva/sclera: Conjunctivae normal.     Pupils: Pupils are equal, round, and reactive to light.  Cardiovascular:     Rate and  Rhythm: Normal rate and regular rhythm.     Heart sounds: No murmur heard.    No friction rub. No gallop.  Pulmonary:     Effort: Pulmonary effort is normal.     Breath sounds: Normal breath sounds.  Abdominal:     General: Abdomen is flat. Bowel sounds are normal.     Palpations: Abdomen is soft.  Musculoskeletal:        General: No swelling. Normal range of motion.     Cervical back: Normal range of motion and neck supple.  Skin:    General: Skin is warm and dry.  Neurological:     General: No focal deficit present.     Mental Status: He is oriented to person, place, and time.  Psychiatric:        Mood and Affect: Mood normal.     Lab Results Lab Results  Component Value Date   WBC 9.6 07/10/2022   HGB 7.4 (L) 07/10/2022   HCT 22.6 (L) 07/10/2022   MCV 89.0 07/10/2022   PLT 482 (H) 07/10/2022    Lab Results  Component Value Date   CREATININE 1.07 07/10/2022   BUN 18 07/10/2022   NA 134 (L) 07/10/2022   K 3.3 (L) 07/10/2022   CL 99 07/10/2022   CO2 23 07/10/2022    Lab Results  Component Value Date   ALT 33 07/10/2022   AST 32 07/10/2022   ALKPHOS 345 (H) 07/10/2022   BILITOT 0.4 07/10/2022       Laurice Record, [facility] 07/10/2022, 1:32 PM

## 2022-07-10 NOTE — Progress Notes (Signed)
PHARMACY - PHYSICIAN COMMUNICATION CRITICAL VALUE ALERT - BLOOD CULTURE IDENTIFICATION (BCID)  [name] is an 57 y.o. male who presented to [facility] on 07/07/2022 with a chief complaint of hematuria.  Assessment:  Patient currently on antibiotics for UTI.  -BCID + 1/3 MSSA  Name of physician (or Provider) Contacted: Dr. Rodena Piety  Current antibiotics: Ceftriaxone   Changes to prescribed antibiotics recommended: Changing to cefazolin per protocol. MSSA bacteremia should generate auto-ID consult.   Results for orders placed or performed during the hospital encounter of 07/07/22  Blood Culture ID Panel (Reflexed) (Collected: 07/09/2022 10:23 AM)  Result Value Ref Range   Enterococcus faecalis NOT DETECTED NOT DETECTED   Enterococcus Faecium NOT DETECTED NOT DETECTED   Listeria monocytogenes NOT DETECTED NOT DETECTED   Staphylococcus species DETECTED (A) NOT DETECTED   Staphylococcus aureus (BCID) DETECTED (A) NOT DETECTED   Staphylococcus epidermidis NOT DETECTED NOT DETECTED   Staphylococcus lugdunensis NOT DETECTED NOT DETECTED   Streptococcus species NOT DETECTED NOT DETECTED   Streptococcus agalactiae NOT DETECTED NOT DETECTED   Streptococcus pneumoniae NOT DETECTED NOT DETECTED   Streptococcus pyogenes NOT DETECTED NOT DETECTED   A.calcoaceticus-baumannii NOT DETECTED NOT DETECTED   Bacteroides fragilis NOT DETECTED NOT DETECTED   Enterobacterales NOT DETECTED NOT DETECTED   Enterobacter cloacae complex NOT DETECTED NOT DETECTED   Escherichia coli NOT DETECTED NOT DETECTED   Klebsiella aerogenes NOT DETECTED NOT DETECTED   Klebsiella oxytoca NOT DETECTED NOT DETECTED   Klebsiella pneumoniae NOT DETECTED NOT DETECTED   Proteus species NOT DETECTED NOT DETECTED   Salmonella species NOT DETECTED NOT DETECTED   Serratia marcescens NOT DETECTED NOT DETECTED   Haemophilus influenzae NOT DETECTED NOT DETECTED   Neisseria meningitidis NOT DETECTED NOT DETECTED   Pseudomonas  aeruginosa NOT DETECTED NOT DETECTED   Stenotrophomonas maltophilia NOT DETECTED NOT DETECTED   Candida albicans NOT DETECTED NOT DETECTED   Candida auris NOT DETECTED NOT DETECTED   Candida glabrata NOT DETECTED NOT DETECTED   Candida krusei NOT DETECTED NOT DETECTED   Candida parapsilosis NOT DETECTED NOT DETECTED   Candida tropicalis NOT DETECTED NOT DETECTED   Cryptococcus neoformans/gattii NOT DETECTED NOT DETECTED   Meth resistant mecA/C and MREJ NOT DETECTED NOT [place], PharmD 07/10/2022  6:14 AM

## 2022-07-11 ENCOUNTER — Inpatient Hospital Stay (HOSPITAL_COMMUNITY): Payer: BC Managed Care – PPO

## 2022-07-11 DIAGNOSIS — R7881 Bacteremia: Secondary | ICD-10-CM | POA: Diagnosis not present

## 2022-07-11 DIAGNOSIS — D649 Anemia, unspecified: Secondary | ICD-10-CM | POA: Diagnosis not present

## 2022-07-11 LAB — COMPREHENSIVE METABOLIC PANEL
ALT: 36 U/L (ref 0–44)
AST: 33 U/L (ref 15–41)
Albumin: 2.3 g/dL — ABNORMAL LOW (ref 3.5–5.0)
Alkaline Phosphatase: 514 U/L — ABNORMAL HIGH (ref 38–126)
Anion gap: 12 (ref 5–15)
BUN: 16 mg/dL (ref 6–20)
CO2: 23 mmol/L (ref 22–32)
Calcium: 8 mg/dL — ABNORMAL LOW (ref 8.9–10.3)
Chloride: 97 mmol/L — ABNORMAL LOW (ref 98–111)
Creatinine, Ser: 1.11 mg/dL (ref 0.61–1.24)
GFR, Estimated: >60 mL/min (ref >60)
Glucose, Bld: 152 mg/dL — ABNORMAL HIGH (ref 70–99)
Potassium: 3.4 mmol/L — ABNORMAL LOW (ref 3.5–5.1)
Sodium: 132 mmol/L — ABNORMAL LOW (ref 135–145)
Total Bilirubin: 0.4 mg/dL (ref 0.3–1.2)
Total Protein: 6.3 g/dL — ABNORMAL LOW (ref 6.5–8.1)

## 2022-07-11 LAB — BPAM RBC
Blood Product Expiration Date: 202403032359
ISSUE DATE / TIME: 202401261434
Unit Type and Rh: 5100

## 2022-07-11 LAB — CBC
HCT: 26.4 % — ABNORMAL LOW (ref 39.0–52.0)
Hemoglobin: 8.7 g/dL — ABNORMAL LOW (ref 13.0–17.0)
MCH: 29.4 pg (ref 26.0–34.0)
MCHC: 33 g/dL (ref 30.0–36.0)
MCV: 89.2 fL (ref 80.0–100.0)
Platelets: 582 10*3/uL — ABNORMAL HIGH (ref 150–400)
RBC: 2.96 MIL/uL — ABNORMAL LOW (ref 4.22–5.81)
RDW: 14.8 % (ref 11.5–15.5)
WBC: 13 10*3/uL — ABNORMAL HIGH (ref 4.0–10.5)
nRBC: 0 % (ref 0.0–0.2)

## 2022-07-11 LAB — ECHOCARDIOGRAM COMPLETE
Area-P 1/2: 4.06 cm2
Calc EF: 59.5 %
Height: 70 in
S' Lateral: 3.4 cm
Single Plane A2C EF: 59.4 %
Single Plane A4C EF: 59.7 %
Weight: 3358.05 oz

## 2022-07-11 LAB — TYPE AND SCREEN
ABO/RH(D): O POS
Antibody Screen: NEGATIVE
Unit division: 0

## 2022-07-11 LAB — MAGNESIUM: Magnesium: 1.8 mg/dL (ref 1.7–2.4)

## 2022-07-11 MED ORDER — RAMIPRIL 5 MG PO CAPS
10.0000 mg | ORAL_CAPSULE | Freq: Every day | ORAL | Status: DC
  Administered 2022-07-11 – 2022-07-16 (×5): 10 mg via ORAL
  Filled 2022-07-11 (×6): qty 2

## 2022-07-11 NOTE — Progress Notes (Signed)
PROGRESS NOTE    [name]  YTK:[id number] DOB: 01-09-1966 DOA: 07/07/2022 PCP: [name], MD   Brief Narrative: 57 year old male with a history of  Prostate Cancer Anemia, DM type 1, radiation cystitis   Presented with  blood clot in urine Pt was sent to ER with Hg of 6.3  fatigued lightheaded SOB  had blood clots  No N/V no abd pain  He received 2 unit of packed RBC with improvement in his hemoglobin. In the ER hemoglobin 6.3 creatinine 1.7 sodium 127 potassium 3.4 Bcid mssa 1/3 Hb 7.4  Assessment & Plan:   Principal Problem:   Symptomatic anemia Active Problems:   Type 1 diabetes mellitus (HCC)   Asthma   Prostate cancer (HCC)   Hyperlipidemia   Essential hypertension   Hematuria   Hyponatremia   Hypokalemia   AKI (acute kidney injury) ([place])   #1 blood loss anemia/symptomatic anemia from gross hematuria likely related to radiation cystitis with history of prostate malignancy-received 3 unit of packed RBC total Hb 8.7 Overnight he had some clots in his urine currently his urine is clearing followed by urology  #2 AKI - resolved renal ultrasound showed no urinary retention or obstruction.   Restart ramipril    #3 type 1 diabetes continue long-acting insulin CBG (last 3)  Recent Labs    07/10/22 0430 07/10/22 0729 07/10/22 1105  GLUCAP 126* 137* 160*     #4 prostate cancer followed by Dr. Alinda Money [facility]  #5 essential hypertension- restart Norvasc  ramipril  #6 hyponatremia in the setting of dehydration and acute bleed Stable 132  #7 hypokalemia replete and recheck labs.  Check mag level 1.8  #8 hyperlipidemia on Crestor prior admission  #9 mssa bacteremia/uti-urine culture with Staph aureus and Enterococcus Blood culture with Staphylococcus -ancef started 1/26 Id consulted Echo r/o veg pending Repeat blood culture 07/10/2022 no growth  Estimated body mass index is 30.11 kg/m as calculated from the following:   Height as of this  encounter: '5\' 10"'$  (1.778 m).   Weight as of this encounter: 95.2 kg.  DVT prophylaxis: None due to hematuria. Code Status: Full Family Communication: None at bedside today consultants:  None  Procedures: None  Antimicrobials: NONE  Subjective:  Had clots in the urine overnight which has resolved he showed me pictures Objective: Vitals:   07/10/22 1732 07/10/22 1742 07/10/22 1957 07/11/22 0518  BP:  136/67 (!) 153/84 (!) 176/70  Pulse:  86 89 93  Resp: '15 18 20 20  '$ Temp:  98.4 F (36.9 C) 98.8 F (37.1 C) 98 F (36.7 C)  TempSrc:  Oral Oral Oral  SpO2:  97% 100% 100%  Weight:      Height:        Intake/Output Summary (Last 24 hours) at 07/11/2022 1358 Last data filed at 07/11/2022 7517 Gross per 24 hour  Intake 3691.97 ml  Output --  Net 3691.97 ml    Filed Weights   07/07/22 1915  Weight: 95.2 kg    Examination:  General exam: Appears in nad  Respiratory system: Clear to auscultation. Respiratory effort normal. Cardiovascular system: S1 & S2 heard, RRR. No JVD, murmurs, rubs, gallops or clicks. No pedal edema. Gastrointestinal system: Abdomen is nondistended, soft and tender. No organomegaly or masses felt. Normal bowel sounds heard. Central nervous system: Alert and oriented. No focal neurological deficits. Extremities: 1 plus b/l edema  Edema skin: No rashes, lesions or ulcers Psychiatry: Judgement and insight appear normal. Mood & affect  appropriate.   Data Reviewed: I have personally reviewed following labs and imaging studies  CBC: Recent Labs  Lab 07/07/22 1917 07/08/22 0545 07/08/22 1454 07/09/22 0514 07/10/22 0525 07/11/22 0631  WBC 8.5 8.1 9.7 10.4 9.6 13.0*  NEUTROABS 6.7 6.2  --   --   --   --   HGB 6.3* 6.5* 7.9* 8.4* 7.4* 8.7*  HCT 19.1* 19.8* 23.6* 25.7* 22.6* 26.4*  MCV 89.7 90.8 88.7 89.5 89.0 89.2  PLT 396 332 377 458* 482* 582*    Basic Metabolic Panel: Recent Labs  Lab 07/07/22 1917 07/08/22 1445 07/08/22 2358  07/09/22 0514 07/10/22 0525 07/11/22 0631  NA 127* 133*  --  133* 134* 132*  K 3.4* 3.5  --  3.3* 3.3* 3.4*  CL 92* 100  --  98 99 97*  CO2 22 22  --  '24 23 23  '$ GLUCOSE 277* 256*  --  212* 136* 152*  BUN 47* 33*  --  24* 18 16  CREATININE 1.73* 1.28*  --  1.39* 1.07 1.11  CALCIUM 8.9 7.9*  --  8.1* 8.1* 8.0*  MG  --  1.9 1.8  --  1.7 1.8  PHOS  --  1.7* 2.5  --   --   --     GFR: Estimated Creatinine Clearance: 87.1 mL/min (by C-G formula based on SCr of 1.11 mg/dL). Liver Function Tests: Recent Labs  Lab 07/08/22 1445 07/08/22 2358 07/09/22 0514 07/10/22 0525 07/11/22 0631  AST 27 38 40 32 33  ALT 35 37 42 33 36  ALKPHOS 247* 255* 286* 345* 514*  BILITOT 0.5 0.7 0.6 0.4 0.4  PROT 5.8* 5.4* 6.1* 6.1* 6.3*  ALBUMIN 2.2* 2.2* 2.3* 2.3* 2.3*    No results for input(s): "LIPASE", "AMYLASE" in the last 168 hours. No results for input(s): "AMMONIA" in the last 168 hours. Coagulation Profile: No results for input(s): "INR", "PROTIME" in the last 168 hours. Cardiac Enzymes: Recent Labs  Lab 07/08/22 2358  CKTOTAL 18*    BNP (last 3 results) No results for input(s): "PROBNP" in the last 8760 hours. HbA1C: No results for input(s): "HGBA1C" in the last 72 hours. CBG: Recent Labs  Lab 07/09/22 2000 07/10/22 0007 07/10/22 0430 07/10/22 0729 07/10/22 1105  GLUCAP 291* 181* 126* 137* 160*    Lipid Profile: No results for input(s): "CHOL", "HDL", "LDLCALC", "TRIG", "CHOLHDL", "LDLDIRECT" in the last 72 hours. Thyroid Function Tests: Recent Labs    07/08/22 2358  TSH 1.311    Anemia Panel: Recent Labs    07/08/22 2358  VITAMINB12 2,840*  FOLATE 13.5  FERRITIN 103  TIBC 242*  IRON 13*  RETICCTPCT 0.6    Sepsis Labs: Recent Labs  Lab 07/08/22 2358  LATICACIDVEN 0.9     Recent Results (from the past 240 hour(s))  Culture, blood (Routine X 2) w Reflex to ID Panel     Status: Abnormal (Preliminary result)   Collection Time: 07/09/22 10:23 AM    Specimen: BLOOD LEFT ARM  Result Value Ref Range Status   Specimen Description   Final    BLOOD LEFT ARM Performed at [facility] [address] [place], [place] [address]    Special Requests   Final    BOTTLES DRAWN AEROBIC AND ANAEROBIC Blood Culture adequate volume Performed at [facility] [address], [place], [place] [address]    Culture  Setup Time   Final    GRAM POSITIVE COCCI AEROBIC BOTTLE ONLY PHARMD  M. SWAYNE 07/10/22 @ 0610 BY AB    Culture (A)  Final    STAPHYLOCOCCUS AUREUS SUSCEPTIBILITIES TO FOLLOW Performed at [facility], [place] [address], [place], [place] [address]    Report Status PENDING  Incomplete  Culture, blood (Routine X 2) w Reflex to ID Panel     Status: Abnormal (Preliminary result)   Collection Time: 07/09/22 10:23 AM   Specimen: BLOOD  Result Value Ref Range Status   Specimen Description   Final    BLOOD BLOOD RIGHT HAND Performed at [facility] [address] [place], [place] [address]    Special Requests   Final    BOTTLES DRAWN AEROBIC ONLY Blood Culture adequate volume Performed at [facility] [address] [place], [place] [address]    Culture  Setup Time   Final    GRAM POSITIVE COCCI IN CLUSTERS AEROBIC BOTTLE ONLY CRITICAL VALUE NOTED.  VALUE IS CONSISTENT WITH PREVIOUSLY REPORTED AND CALLED VALUE. Performed at [facility], [place] [address], [place], [place] [address]    Culture STAPHYLOCOCCUS AUREUS (A)  Final   Report Status PENDING  Incomplete  Blood Culture ID Panel (Reflexed)     Status: Abnormal   Collection Time: 07/09/22 10:23 AM  Result Value Ref Range Status   Enterococcus faecalis NOT DETECTED NOT DETECTED Final   Enterococcus Faecium NOT DETECTED NOT DETECTED Final   Listeria monocytogenes NOT DETECTED NOT DETECTED Final   Staphylococcus species DETECTED (A) NOT DETECTED Final    Comment: CRITICAL RESULT CALLED TO, READ BACK BY AND  VERIFIED WITH: PHARMD M. SWAYNE 07/10/22 @ 0610 BY AB    Staphylococcus aureus (BCID) DETECTED (A) NOT DETECTED Final    Comment: CRITICAL RESULT CALLED TO, READ BACK BY AND VERIFIED WITH: PHARMD M. SWAYNE 07/10/22 @ 0610 BY AB    Staphylococcus epidermidis NOT DETECTED NOT DETECTED Final   Staphylococcus lugdunensis NOT DETECTED NOT DETECTED Final   Streptococcus species NOT DETECTED NOT DETECTED Final   Streptococcus agalactiae NOT DETECTED NOT DETECTED Final   Streptococcus pneumoniae NOT DETECTED NOT DETECTED Final   Streptococcus pyogenes NOT DETECTED NOT DETECTED Final   A.calcoaceticus-baumannii NOT DETECTED NOT DETECTED Final   Bacteroides fragilis NOT DETECTED NOT DETECTED Final   Enterobacterales NOT DETECTED NOT DETECTED Final   Enterobacter cloacae complex NOT DETECTED NOT DETECTED Final   Escherichia coli NOT DETECTED NOT DETECTED Final   Klebsiella aerogenes NOT DETECTED NOT DETECTED Final   Klebsiella oxytoca NOT DETECTED NOT DETECTED Final   Klebsiella pneumoniae NOT DETECTED NOT DETECTED Final   Proteus species NOT DETECTED NOT DETECTED Final   Salmonella species NOT DETECTED NOT DETECTED Final   Serratia marcescens NOT DETECTED NOT DETECTED Final   Haemophilus influenzae NOT DETECTED NOT DETECTED Final   Neisseria meningitidis NOT DETECTED NOT DETECTED Final   Pseudomonas aeruginosa NOT DETECTED NOT DETECTED Final   Stenotrophomonas maltophilia NOT DETECTED NOT DETECTED Final   Candida albicans NOT DETECTED NOT DETECTED Final   Candida auris NOT DETECTED NOT DETECTED Final   Candida glabrata NOT DETECTED NOT DETECTED Final   Candida krusei NOT DETECTED NOT DETECTED Final   Candida parapsilosis NOT DETECTED NOT DETECTED Final   Candida tropicalis NOT DETECTED NOT DETECTED Final   Cryptococcus neoformans/gattii NOT DETECTED NOT DETECTED Final   Meth resistant mecA/C and MREJ NOT DETECTED NOT DETECTED Final    Comment: Performed at [facility], [address] [place], [place] [address]  Urine Culture (for pregnant, neutropenic or urologic patients or patients with an indwelling urinary catheter)     Status: Abnormal (Preliminary result)   Collection Time: 07/09/22 11:20 AM   Specimen: Urine, Clean Catch  Result Value Ref Range Status   Specimen Description   Final    URINE, CLEAN CATCH Performed at [facility], [place] [address], [place], [place] [address]    Special Requests   Final    NONE Performed at [facility], [place] [address] [place], [place] [address]    Culture (A)  Final    >=100,000 COLONIES/mL STAPHYLOCOCCUS AUREUS >=100,000 COLONIES/mL ENTEROCOCCUS FAECALIS CULTURE REINCUBATED FOR BETTER GROWTH Performed at [facility], [place] [address], [place], [place] [address]    Report Status PENDING  Incomplete  Gastrointestinal Panel by PCR , Stool     Status: None   Collection Time: 07/10/22  9:22 AM   Specimen: STOOL  Result Value Ref Range Status   Campylobacter species NOT DETECTED NOT DETECTED Final   Plesimonas shigelloides NOT DETECTED NOT DETECTED Final   Salmonella species NOT DETECTED NOT DETECTED Final   Yersinia enterocolitica NOT DETECTED NOT DETECTED Final   Vibrio species NOT DETECTED NOT DETECTED Final   Vibrio cholerae NOT DETECTED NOT DETECTED Final   Enteroaggregative E coli (EAEC) NOT DETECTED NOT DETECTED Final   Enteropathogenic E coli (EPEC) NOT DETECTED NOT DETECTED Final   Enterotoxigenic E coli (ETEC) NOT DETECTED NOT DETECTED Final   Shiga like toxin producing E coli (STEC) NOT DETECTED NOT DETECTED Final   Shigella/Enteroinvasive E coli (EIEC) NOT DETECTED NOT DETECTED Final   Cryptosporidium NOT DETECTED NOT DETECTED Final   Cyclospora cayetanensis NOT DETECTED NOT DETECTED Final   Entamoeba histolytica NOT DETECTED NOT DETECTED Final   Giardia lamblia NOT DETECTED NOT DETECTED Final   Adenovirus F40/41 NOT DETECTED NOT DETECTED Final   Astrovirus NOT DETECTED  NOT DETECTED Final   Norovirus GI/GII NOT DETECTED NOT DETECTED Final   Rotavirus A NOT DETECTED NOT DETECTED Final   Sapovirus (I, II, IV, and V) NOT DETECTED NOT DETECTED Final    Comment: Performed at [facility], [address] [place], [place] [address]  Culture, blood (Routine X 2) w Reflex to ID Panel     Status: None (Preliminary result)   Collection Time: 07/10/22  2:21 PM   Specimen: BLOOD  Result Value Ref Range Status   Specimen Description   Final    BLOOD BLOOD LEFT ARM AEROBIC BOTTLE ONLY ANAEROBIC BOTTLE ONLY Performed at [facility], [place] [address], [place], [place] [address]    Special Requests   Final    BOTTLES DRAWN AEROBIC AND ANAEROBIC Blood Culture adequate volume Performed at [facility] [address], [place], [place] [address]    Culture   Final    NO GROWTH < 12 HOURS Performed at [facility] [address], [place], [place] [address]    Report Status PENDING  Incomplete  Culture, blood (Routine X 2) w Reflex to ID Panel     Status: None (Preliminary result)   Collection Time: 07/10/22  2:24 PM   Specimen: BLOOD  Result Value Ref Range Status   Specimen Description   Final    BLOOD BLOOD LEFT HAND AEROBIC BOTTLE ONLY ANAEROBIC BOTTLE ONLY Performed at [facility] [address], [place], [place] [address]    Special Requests   Final  BOTTLES DRAWN AEROBIC AND ANAEROBIC Blood Culture results may not be optimal due to an inadequate volume of blood received in culture bottles Performed at [facility], [place] [address] [place], [place] [address]    Culture   Final    NO GROWTH < 12 HOURS Performed at [facility] [address], [place], [place] [address]    Report Status PENDING  Incomplete         Radiology Studies: No results found.      Scheduled Meds:  amLODipine  5 mg Oral Daily   enzalutamide  160 mg Oral QHS   insulin  aspart  0-9 Units Subcutaneous Q4H   insulin glargine-yfgn  25 Units Subcutaneous Daily   rosuvastatin  5 mg Oral Once per day on Mon Thu   Continuous Infusions:   ceFAZolin (ANCEF) IV 2 g (07/11/22 1313)      LOS: 3 days    Time spent: 93 min  Georgette Shell, MD 07/11/2022, 1:58 PM

## 2022-07-11 NOTE — Progress Notes (Signed)
S: Patient reports he passed a clot last night at 1030 and a few clots after that.  He showed me a picture.  Fortunately his urine is clear.  He is voiding clear yellow urine this morning.  He has no painful inability to void.  O:  Vitals:   07/10/22 1957 07/11/22 0518  BP: (!) 153/84 (!) 176/70  Pulse: 89 93  Resp: 20 20  Temp: 98.8 F (37.1 C) 98 F (36.7 C)  SpO2: 100% 100%   CBC    Component Value Date/Time   WBC 13.0 (H) 07/11/2022 0631   RBC 2.96 (L) 07/11/2022 0631   HGB 8.7 (L) 07/11/2022 0631   HCT 26.4 (L) 07/11/2022 0631   PLT 582 (H) 07/11/2022 0631   MCV 89.2 07/11/2022 0631   MCH 29.4 07/11/2022 0631   MCHC 33.0 07/11/2022 0631   RDW 14.8 07/11/2022 0631   LYMPHSABS 0.4 (L) 07/08/2022 0545   MONOABS 1.4 (H) 07/08/2022 0545   EOSABS 0.1 07/08/2022 0545   BASOSABS 0.0 07/08/2022 0545   Lab Results  Component Value Date   CREATININE 1.11 07/11/2022   CREATININE 1.07 07/10/2022   CREATININE 1.39 (H) 07/09/2022   No acute distress-watching TV and talking with his wife. Abdomen soft and nontender  Impression/Recommendation Hematuria: Secondary to radiation cystitis.  Currently, urine clear. Dr. Alinda Money recommend not placing a catheter as this may cause more bleeding due to irritation of bladder mucosa.  If he starts having increased active bleeding, will place hematuria 3 way catheter and begin Alum solution irrigation. Prostate cancer: Continue Xtandi 160 mg daily.  He is current with his ADT.

## 2022-07-11 NOTE — Progress Notes (Signed)
Echocardiogram 2D Echocardiogram has been performed.  [name] 07/11/2022, 3:03 PM

## 2022-07-12 DIAGNOSIS — D649 Anemia, unspecified: Secondary | ICD-10-CM | POA: Diagnosis not present

## 2022-07-12 LAB — COMPREHENSIVE METABOLIC PANEL
ALT: 22 U/L (ref 0–44)
AST: 23 U/L (ref 15–41)
Albumin: 2.1 g/dL — ABNORMAL LOW (ref 3.5–5.0)
Alkaline Phosphatase: 411 U/L — ABNORMAL HIGH (ref 38–126)
Anion gap: 10 (ref 5–15)
BUN: 16 mg/dL (ref 6–20)
CO2: 25 mmol/L (ref 22–32)
Calcium: 8 mg/dL — ABNORMAL LOW (ref 8.9–10.3)
Chloride: 98 mmol/L (ref 98–111)
Creatinine, Ser: 1.19 mg/dL (ref 0.61–1.24)
GFR, Estimated: >60 mL/min (ref >60)
Glucose, Bld: 218 mg/dL — ABNORMAL HIGH (ref 70–99)
Potassium: 3.3 mmol/L — ABNORMAL LOW (ref 3.5–5.1)
Sodium: 133 mmol/L — ABNORMAL LOW (ref 135–145)
Total Bilirubin: 0.2 mg/dL — ABNORMAL LOW (ref 0.3–1.2)
Total Protein: 5.4 g/dL — ABNORMAL LOW (ref 6.5–8.1)

## 2022-07-12 LAB — MAGNESIUM: Magnesium: 1.8 mg/dL (ref 1.7–2.4)

## 2022-07-12 LAB — CBC
HCT: 24.4 % — ABNORMAL LOW (ref 39.0–52.0)
Hemoglobin: 8 g/dL — ABNORMAL LOW (ref 13.0–17.0)
MCH: 29.1 pg (ref 26.0–34.0)
MCHC: 32.8 g/dL (ref 30.0–36.0)
MCV: 88.7 fL (ref 80.0–100.0)
Platelets: 616 10*3/uL — ABNORMAL HIGH (ref 150–400)
RBC: 2.75 MIL/uL — ABNORMAL LOW (ref 4.22–5.81)
RDW: 14.6 % (ref 11.5–15.5)
WBC: 10.3 10*3/uL (ref 4.0–10.5)
nRBC: 0 % (ref 0.0–0.2)

## 2022-07-12 LAB — CULTURE, BLOOD (ROUTINE X 2)
Special Requests: ADEQUATE
Special Requests: ADEQUATE

## 2022-07-12 MED ORDER — POTASSIUM CHLORIDE CRYS ER 20 MEQ PO TBCR
40.0000 meq | EXTENDED_RELEASE_TABLET | Freq: Once | ORAL | Status: AC
  Administered 2022-07-12: 40 meq via ORAL
  Filled 2022-07-12: qty 2

## 2022-07-12 NOTE — Progress Notes (Signed)
PROGRESS NOTE    [name]  KZL:[id number] DOB: 01-10-1966 DOA: 07/07/2022 PCP: [name], MD   Brief Narrative: 57 year old male with a history of  Prostate Cancer Anemia, DM type 1, radiation cystitis   Presented with  blood clot in urine Pt was sent to ER with Hg of 6.3  fatigued lightheaded SOB  had blood clots  No N/V no abd pain  He received 2 unit of packed RBC with improvement in his hemoglobin. In the ER hemoglobin 6.3 creatinine 1.7 sodium 127 potassium 3.4 Bcid mssa 1/3 Hb 7.4  Assessment & Plan:   Principal Problem:   Symptomatic anemia Active Problems:   Type 1 diabetes mellitus (HCC)   Asthma   Prostate cancer (HCC)   Hyperlipidemia   Essential hypertension   Hematuria   Hyponatremia   Hypokalemia   AKI (acute kidney injury) ([facility])   #1 blood loss anemia/symptomatic anemia from gross hematuria likely related to radiation cystitis with history of prostate malignancy-received 3 unit of packed RBC total Hb 8.0 No further clot in the urine though urine still with gross hematuria  #2 AKI - resolved renal ultrasound showed no urinary retention or obstruction.   Restarted ramipril    #3 type 1 diabetes continue long-acting insulin CBG (last 3)  Recent Labs    07/10/22 0430 07/10/22 0729 07/10/22 1105  GLUCAP 126* 137* 160*     #4 prostate cancer followed by Dr. Alinda Money [facility]  #5 essential hypertension- restart Norvasc  ramipril  #6 hyponatremia in the setting of dehydration and acute bleed Stable 132  #7 hypokalemia replete and recheck labs.  Check mag level 1.8  #8 hyperlipidemia on Crestor prior admission  #9 mssa bacteremia/uti-urine culture with Staph aureus and Enterococcus Blood culture with Staphylococcus -ancef started 1/26 Id consulted Echo normal ejection fraction Repeat blood culture 07/10/2022 no growth  Estimated body mass index is 30.11 kg/m as calculated from the following:   Height as of this encounter: '5\' 10"'$   (1.778 m).   Weight as of this encounter: 95.2 kg.  DVT prophylaxis: None due to hematuria. Code Status: Full Family Communication: None at bedside today consultants:  None  Procedures: None  Antimicrobials: Ancef  Subjective: No clots in the urine overnight however urinalysis still with gross hematuria   Objective: Vitals:   07/11/22 0518 07/11/22 1454 07/12/22 0500 07/12/22 1229  BP: (!) 176/70 (!) 144/81 (!) 147/66 (!) 146/76  Pulse: 93 95 82 85  Resp: '20 18 16 18  '$ Temp: 98 F (36.7 C) 98.5 F (36.9 C) 98.4 F (36.9 C) 97.8 F (36.6 C)  TempSrc: Oral Oral Oral Oral  SpO2: 100% 100% 98% 100%  Weight:      Height:        Intake/Output Summary (Last 24 hours) at 07/12/2022 1354 Last data filed at 07/12/2022 0750 Gross per 24 hour  Intake 440 ml  Output --  Net 440 ml    Filed Weights   07/07/22 1915  Weight: 95.2 kg    Examination:  General exam: Appears in nad  Respiratory system: Clear to auscultation. Respiratory effort normal. Cardiovascular system: S1 & S2 heard, RRR. No JVD, murmurs, rubs, gallops or clicks. No pedal edema. Gastrointestinal system: Abdomen is nondistended, soft and tender. No organomegaly or masses felt. Normal bowel sounds heard. Central nervous system: Alert and oriented. No focal neurological deficits. Extremities: 1 plus b/l edema  Edema skin: No rashes, lesions or ulcers Psychiatry: Judgement and insight appear normal. Mood &  affect appropriate.   Data Reviewed: I have personally reviewed following labs and imaging studies  CBC: Recent Labs  Lab 07/07/22 1917 07/08/22 0545 07/08/22 1454 07/09/22 0514 07/10/22 0525 07/11/22 0631 07/12/22 0609  WBC 8.5 8.1 9.7 10.4 9.6 13.0* 10.3  NEUTROABS 6.7 6.2  --   --   --   --   --   HGB 6.3* 6.5* 7.9* 8.4* 7.4* 8.7* 8.0*  HCT 19.1* 19.8* 23.6* 25.7* 22.6* 26.4* 24.4*  MCV 89.7 90.8 88.7 89.5 89.0 89.2 88.7  PLT 396 332 377 458* 482* 582* 616*    Basic Metabolic Panel: Recent  Labs  Lab 07/08/22 1445 07/08/22 2358 07/09/22 0514 07/10/22 0525 07/11/22 0631 07/12/22 0609  NA 133*  --  133* 134* 132* 133*  K 3.5  --  3.3* 3.3* 3.4* 3.3*  CL 100  --  98 99 97* 98  CO2 22  --  '24 23 23 25  '$ GLUCOSE 256*  --  212* 136* 152* 218*  BUN 33*  --  24* '18 16 16  '$ CREATININE 1.28*  --  1.39* 1.07 1.11 1.19  CALCIUM 7.9*  --  8.1* 8.1* 8.0* 8.0*  MG 1.9 1.8  --  1.7 1.8 1.8  PHOS 1.7* 2.5  --   --   --   --     GFR: Estimated Creatinine Clearance: 81.3 mL/min (by C-G formula based on SCr of 1.19 mg/dL). Liver Function Tests: Recent Labs  Lab 07/08/22 2358 07/09/22 0514 07/10/22 0525 07/11/22 0631 07/12/22 0609  AST 38 40 32 33 23  ALT 37 42 33 36 22  ALKPHOS 255* 286* 345* 514* 411*  BILITOT 0.7 0.6 0.4 0.4 0.2*  PROT 5.4* 6.1* 6.1* 6.3* 5.4*  ALBUMIN 2.2* 2.3* 2.3* 2.3* 2.1*    No results for input(s): "LIPASE", "AMYLASE" in the last 168 hours. No results for input(s): "AMMONIA" in the last 168 hours. Coagulation Profile: No results for input(s): "INR", "PROTIME" in the last 168 hours. Cardiac Enzymes: Recent Labs  Lab 07/08/22 2358  CKTOTAL 18*    BNP (last 3 results) No results for input(s): "PROBNP" in the last 8760 hours. HbA1C: No results for input(s): "HGBA1C" in the last 72 hours. CBG: Recent Labs  Lab 07/09/22 2000 07/10/22 0007 07/10/22 0430 07/10/22 0729 07/10/22 1105  GLUCAP 291* 181* 126* 137* 160*    Lipid Profile: No results for input(s): "CHOL", "HDL", "LDLCALC", "TRIG", "CHOLHDL", "LDLDIRECT" in the last 72 hours. Thyroid Function Tests: No results for input(s): "TSH", "T4TOTAL", "FREET4", "T3FREE", "THYROIDAB" in the last 72 hours.  Anemia Panel: No results for input(s): "VITAMINB12", "FOLATE", "FERRITIN", "TIBC", "IRON", "RETICCTPCT" in the last 72 hours.  Sepsis Labs: Recent Labs  Lab 07/08/22 2358  LATICACIDVEN 0.9     Recent Results (from the past 240 hour(s))  Culture, blood (Routine X 2) w Reflex to  ID Panel     Status: Abnormal   Collection Time: 07/09/22 10:23 AM   Specimen: BLOOD LEFT ARM  Result Value Ref Range Status   Specimen Description   Final    BLOOD LEFT ARM Performed at [facility] [address] [place], [place] [address]    Special Requests   Final    BOTTLES DRAWN AEROBIC AND ANAEROBIC Blood Culture adequate volume Performed at [facility] [address], [place], [place] [address]    Culture  Setup Time   Final    GRAM POSITIVE COCCI AEROBIC BOTTLE ONLY PHARMD M. Moreen Fowler 07/10/22 @  6629 BY AB Performed at [facility], [place] [address] [place], [place] [address]    Culture STAPHYLOCOCCUS AUREUS (A)  Final   Report Status 07/12/2022 FINAL  Final   Organism ID, Bacteria STAPHYLOCOCCUS AUREUS  Final      Susceptibility   Staphylococcus aureus - MIC*    CIPROFLOXACIN <=0.5 SENSITIVE Sensitive     ERYTHROMYCIN <=0.25 SENSITIVE Sensitive     GENTAMICIN <=0.5 SENSITIVE Sensitive     OXACILLIN 0.5 SENSITIVE Sensitive     TETRACYCLINE <=1 SENSITIVE Sensitive     VANCOMYCIN <=0.5 SENSITIVE Sensitive     TRIMETH/SULFA <=10 SENSITIVE Sensitive     CLINDAMYCIN <=0.25 SENSITIVE Sensitive     RIFAMPIN <=0.5 SENSITIVE Sensitive     Inducible Clindamycin NEGATIVE Sensitive     * STAPHYLOCOCCUS AUREUS  Culture, blood (Routine X 2) w Reflex to ID Panel     Status: Abnormal   Collection Time: 07/09/22 10:23 AM   Specimen: BLOOD  Result Value Ref Range Status   Specimen Description   Final    BLOOD BLOOD RIGHT HAND Performed at [facility] [address], [place], [place] [address]    Special Requests   Final    BOTTLES DRAWN AEROBIC ONLY Blood Culture adequate volume Performed at [facility] [address] [place], [place] [address]    Culture  Setup Time   Final    GRAM POSITIVE COCCI IN CLUSTERS AEROBIC BOTTLE ONLY CRITICAL VALUE NOTED.  VALUE IS CONSISTENT WITH PREVIOUSLY REPORTED AND  CALLED VALUE.    Culture (A)  Final    STAPHYLOCOCCUS AUREUS SUSCEPTIBILITIES PERFORMED ON PREVIOUS CULTURE WITHIN THE LAST 5 DAYS. Performed at [facility], [place] [address] [place], [place] [address]    Report Status 07/12/2022 FINAL  Final  Blood Culture ID Panel (Reflexed)     Status: Abnormal   Collection Time: 07/09/22 10:23 AM  Result Value Ref Range Status   Enterococcus faecalis NOT DETECTED NOT DETECTED Final   Enterococcus Faecium NOT DETECTED NOT DETECTED Final   Listeria monocytogenes NOT DETECTED NOT DETECTED Final   Staphylococcus species DETECTED (A) NOT DETECTED Final    Comment: CRITICAL RESULT CALLED TO, READ BACK BY AND VERIFIED WITH: PHARMD M. SWAYNE 07/10/22 @ 0610 BY AB    Staphylococcus aureus (BCID) DETECTED (A) NOT DETECTED Final    Comment: CRITICAL RESULT CALLED TO, READ BACK BY AND VERIFIED WITH: PHARMD M. SWAYNE 07/10/22 @ 0610 BY AB    Staphylococcus epidermidis NOT DETECTED NOT DETECTED Final   Staphylococcus lugdunensis NOT DETECTED NOT DETECTED Final   Streptococcus species NOT DETECTED NOT DETECTED Final   Streptococcus agalactiae NOT DETECTED NOT DETECTED Final   Streptococcus pneumoniae NOT DETECTED NOT DETECTED Final   Streptococcus pyogenes NOT DETECTED NOT DETECTED Final   A.calcoaceticus-baumannii NOT DETECTED NOT DETECTED Final   Bacteroides fragilis NOT DETECTED NOT DETECTED Final   Enterobacterales NOT DETECTED NOT DETECTED Final   Enterobacter cloacae complex NOT DETECTED NOT DETECTED Final   Escherichia coli NOT DETECTED NOT DETECTED Final   Klebsiella aerogenes NOT DETECTED NOT DETECTED Final   Klebsiella oxytoca NOT DETECTED NOT DETECTED Final   Klebsiella pneumoniae NOT DETECTED NOT DETECTED Final   Proteus species NOT DETECTED NOT DETECTED Final   Salmonella species NOT DETECTED NOT DETECTED Final   Serratia marcescens NOT DETECTED NOT DETECTED Final   Haemophilus influenzae NOT DETECTED NOT DETECTED Final   Neisseria  meningitidis NOT DETECTED NOT DETECTED Final  Pseudomonas aeruginosa NOT DETECTED NOT DETECTED Final   Stenotrophomonas maltophilia NOT DETECTED NOT DETECTED Final   Candida albicans NOT DETECTED NOT DETECTED Final   Candida auris NOT DETECTED NOT DETECTED Final   Candida glabrata NOT DETECTED NOT DETECTED Final   Candida krusei NOT DETECTED NOT DETECTED Final   Candida parapsilosis NOT DETECTED NOT DETECTED Final   Candida tropicalis NOT DETECTED NOT DETECTED Final   Cryptococcus neoformans/gattii NOT DETECTED NOT DETECTED Final   Meth resistant mecA/C and MREJ NOT DETECTED NOT DETECTED Final    Comment: Performed at [facility], [place] [address] [place], [place] [address]  Urine Culture (for pregnant, neutropenic or urologic patients or patients with an indwelling urinary catheter)     Status: Abnormal (Preliminary result)   Collection Time: 07/09/22 11:20 AM   Specimen: Urine, Clean Catch  Result Value Ref Range Status   Specimen Description   Final    URINE, CLEAN CATCH Performed at [facility], [place] [address], [place], [place] [address]    Special Requests   Final    NONE Performed at [facility], [place] [address], [place], [place] [address]    Culture (A)  Final    >=100,000 COLONIES/mL STAPHYLOCOCCUS AUREUS >=100,000 COLONIES/mL ENTEROCOCCUS FAECALIS SUSCEPTIBILITIES TO FOLLOW Performed at [facility], [place] [address] [place], [place] [address]    Report Status PENDING  Incomplete  Gastrointestinal Panel by PCR , Stool     Status: None   Collection Time: 07/10/22  9:22 AM   Specimen: STOOL  Result Value Ref Range Status   Campylobacter species NOT DETECTED NOT DETECTED Final   Plesimonas shigelloides NOT DETECTED NOT DETECTED Final   Salmonella species NOT DETECTED NOT DETECTED Final   Yersinia enterocolitica NOT DETECTED NOT DETECTED Final   Vibrio species NOT DETECTED NOT DETECTED Final   Vibrio cholerae NOT DETECTED  NOT DETECTED Final   Enteroaggregative E coli (EAEC) NOT DETECTED NOT DETECTED Final   Enteropathogenic E coli (EPEC) NOT DETECTED NOT DETECTED Final   Enterotoxigenic E coli (ETEC) NOT DETECTED NOT DETECTED Final   Shiga like toxin producing E coli (STEC) NOT DETECTED NOT DETECTED Final   Shigella/Enteroinvasive E coli (EIEC) NOT DETECTED NOT DETECTED Final   Cryptosporidium NOT DETECTED NOT DETECTED Final   Cyclospora cayetanensis NOT DETECTED NOT DETECTED Final   Entamoeba histolytica NOT DETECTED NOT DETECTED Final   Giardia lamblia NOT DETECTED NOT DETECTED Final   Adenovirus F40/41 NOT DETECTED NOT DETECTED Final   Astrovirus NOT DETECTED NOT DETECTED Final   Norovirus GI/GII NOT DETECTED NOT DETECTED Final   Rotavirus A NOT DETECTED NOT DETECTED Final   Sapovirus (I, II, IV, and V) NOT DETECTED NOT DETECTED Final    Comment: Performed at [facility], [address] [place], [place] [address]  Culture, blood (Routine X 2) w Reflex to ID Panel     Status: None (Preliminary result)   Collection Time: 07/10/22  2:21 PM   Specimen: BLOOD  Result Value Ref Range Status   Specimen Description   Final    BLOOD BLOOD LEFT ARM AEROBIC BOTTLE ONLY ANAEROBIC BOTTLE ONLY Performed at [facility], [place] [address] [place], [place] [address]    Special Requests   Final    BOTTLES DRAWN AEROBIC AND ANAEROBIC Blood Culture adequate volume Performed at [facility] [address] [place],  [address]    Culture   Final  NO GROWTH 2 DAYS Performed at [facility], [place] [address], [place], [place] [address]    Report Status PENDING  Incomplete  Culture, blood (Routine X 2) w Reflex to ID Panel     Status: None (Preliminary result)   Collection Time: 07/10/22  2:24 PM   Specimen: BLOOD  Result Value Ref Range Status   Specimen Description   Final    BLOOD BLOOD LEFT HAND AEROBIC BOTTLE ONLY ANAEROBIC BOTTLE ONLY Performed at  [facility] [address], [place], [place] [address]    Special Requests   Final    BOTTLES DRAWN AEROBIC AND ANAEROBIC Blood Culture results may not be optimal due to an inadequate volume of blood received in culture bottles Performed at [facility] [address] [place], [place] [address]    Culture   Final    NO GROWTH 2 DAYS Performed at [facility] [address], [place], [place] [address]    Report Status PENDING  Incomplete         Radiology Studies: ECHOCARDIOGRAM COMPLETE  Result Date: 07/11/2022    ECHOCARDIOGRAM REPORT   Patient Name:   RAEL YO Date of Exam: 07/11/2022 Medical Rec #:  [id number]     Height:       70.0 in Accession #:    [id number]    Weight:       209.9 lb Date of Birth:  07-Nov-1965     BSA:          2.131 m Patient Age:    50 years      BP:           176/70 mmHg Patient Gender: M             HR:           87 bpm. Exam Location:  Inpatient Procedure: 2D Echo, Cardiac Doppler and Color Doppler Indications:    Bacteremia R78.81  History:        Patient has no prior history of Echocardiogram examinations.                 Risk Factors:Diabetes and Hypertension.  Sonographer:    Bernadene Person RDCS Referring Phys: [id number] [place]  1. Left ventricular ejection fraction, by estimation, is 60 to 65%. The left ventricle has normal function. The left ventricle has no regional wall motion abnormalities. Left ventricular diastolic parameters were normal.  2. Right ventricular systolic function is normal. The right ventricular size is normal. Tricuspid regurgitation signal is inadequate for assessing PA pressure.  3. Right atrial size was mildly dilated.  4. The mitral valve is normal in structure. Trivial mitral valve regurgitation. No evidence of mitral stenosis.  5. The aortic valve is tricuspid. Aortic valve regurgitation is not visualized. No aortic stenosis is present.  6. The inferior vena  cava is normal in size with greater than 50% respiratory variability, suggesting right atrial pressure of 3 mmHg. Comparison(s): No prior Echocardiogram. FINDINGS  Left Ventricle: Left ventricular ejection fraction, by estimation, is 60 to 65%. The left ventricle has normal function. The left ventricle has no regional wall motion abnormalities. The left ventricular internal cavity size was normal in size. There is  no left ventricular hypertrophy. Left ventricular diastolic parameters were normal. Right Ventricle: The right ventricular size is normal. Right ventricular systolic function is normal. Tricuspid regurgitation signal is inadequate for assessing PA pressure. The tricuspid regurgitant velocity is  2.47 m/s, and with an assumed right atrial  pressure of 3 mmHg, the estimated right ventricular systolic pressure is 34.7 mmHg. Left Atrium: Left atrial size was normal in size. Right Atrium: Right atrial size was mildly dilated. Pericardium: There is no evidence of pericardial effusion. Mitral Valve: The mitral valve is normal in structure. Trivial mitral valve regurgitation. No evidence of mitral valve stenosis. Tricuspid Valve: The tricuspid valve is normal in structure. Tricuspid valve regurgitation is trivial. No evidence of tricuspid stenosis. Aortic Valve: The aortic valve is tricuspid. Aortic valve regurgitation is not visualized. No aortic stenosis is present. Pulmonic Valve: The pulmonic valve was normal in structure. Pulmonic valve regurgitation is trivial. No evidence of pulmonic stenosis. Aorta: The aortic root is normal in size and structure. Venous: The inferior vena cava is normal in size with greater than 50% respiratory variability, suggesting right atrial pressure of 3 mmHg. IAS/Shunts: No atrial level shunt detected by color flow Doppler.  LEFT VENTRICLE PLAX 2D LVIDd:         5.00 cm      Diastology LVIDs:         3.40 cm      LV e' medial:    8.27 cm/s LV PW:         0.90 cm      LV E/e'  medial:  13.3 LV IVS:        0.90 cm      LV e' lateral:   9.90 cm/s LVOT diam:     2.00 cm      LV E/e' lateral: 11.1 LV SV:         67 LV SV Index:   31 LVOT Area:     3.14 cm  LV Volumes (MOD) LV vol d, MOD A2C: 128.0 ml LV vol d, MOD A4C: 111.0 ml LV vol s, MOD A2C: 52.0 ml LV vol s, MOD A4C: 44.7 ml LV SV MOD A2C:     76.0 ml LV SV MOD A4C:     111.0 ml LV SV MOD BP:      72.3 ml RIGHT VENTRICLE RV S prime:     13.90 cm/s TAPSE (M-mode): 2.4 cm LEFT ATRIUM             Index        RIGHT ATRIUM           Index LA diam:        4.00 cm 1.88 cm/m   RA Area:     21.30 cm LA Vol (A2C):   48.3 ml 22.67 ml/m  RA Volume:   73.70 ml  34.59 ml/m LA Vol (A4C):   37.6 ml 17.65 ml/m LA Biplane Vol: 44.4 ml 20.84 ml/m  AORTIC VALVE LVOT Vmax:   113.00 cm/s LVOT Vmean:  75.800 cm/s LVOT VTI:    0.212 m  AORTA Ao Root diam: 3.40 cm Ao Asc diam:  3.00 cm MITRAL VALVE                TRICUSPID VALVE MV Area (PHT): 4.06 cm     TR Peak grad:   24.4 mmHg MV Decel Time: 187 msec     TR Vmax:        247.00 cm/s MV E velocity: 110.00 cm/s MV A velocity: 74.90 cm/s   SHUNTS MV E/A ratio:  1.47         Systemic VTI:  0.21 m  Systemic Diam: 2.00 cm Kirk Ruths MD Electronically signed by Kirk Ruths MD Signature Date/Time: 07/11/2022/3:00:12 PM    Final         Scheduled Meds:  amLODipine  5 mg Oral Daily   enzalutamide  160 mg Oral QHS   insulin aspart  0-9 Units Subcutaneous Q4H   insulin glargine-yfgn  25 Units Subcutaneous Daily   ramipril  10 mg Oral Daily   rosuvastatin  5 mg Oral Once per day on Mon Thu   Continuous Infusions:   ceFAZolin (ANCEF) IV 2 g (07/12/22 1345)      LOS: 4 days    Time spent: 75 min  Georgette Shell, MD 07/12/2022, 1:54 PM

## 2022-07-12 NOTE — Progress Notes (Signed)
S: Mukund is well today.  He is feeling better today than he has.  He is not passing any clots and voiding without difficulty.  Actually is mostly incontinent and drips into the diaper.  O: Vitals:   07/11/22 1454 07/12/22 0500  BP: (!) 144/81 (!) 147/66  Pulse: 95 82  Resp: 18 16  Temp: 98.5 F (36.9 C) 98.4 F (36.9 C)  SpO2: 100% 98%   He looks well, he is at the sink walking around the room. GU-he showed me his diaper.  It is wet.  There is pink urine stain in the diaper but no clot.  CBC    Component Value Date/Time   WBC 10.3 07/12/2022 0609   RBC 2.75 (L) 07/12/2022 0609   HGB 8.0 (L) 07/12/2022 0609   HCT 24.4 (L) 07/12/2022 0609   PLT 616 (H) 07/12/2022 0609   MCV 88.7 07/12/2022 0609   MCH 29.1 07/12/2022 0609   MCHC 32.8 07/12/2022 0609   RDW 14.6 07/12/2022 0609   LYMPHSABS 0.4 (L) 07/08/2022 0545   MONOABS 1.4 (H) 07/08/2022 0545   EOSABS 0.1 07/08/2022 0545   BASOSABS 0.0 07/08/2022 0545   Lab Results  Component Value Date   CREATININE 1.19 07/12/2022   CREATININE 1.11 07/11/2022   CREATININE 1.07 07/10/2022    Assessment/plan: Hematuria: Secondary to radiation cystitis.  Currently, urine light pink to red. No clots. Dr. Alinda Money recommend not placing a catheter as this may cause more bleeding due to irritation of bladder mucosa.  If he starts having increased active bleeding, will place hematuria 3 way catheter and begin Alum solution irrigation.  Discussed with patient and need again really like to avoid the catheter.  He has not had any clot or painful inability to void.  He did have 1 clot Friday night/early Saturday morning which is not typical 2 weeks out after a fulguration. Hemoglobin at 8 today.  We will continue to monitor. Prostate cancer: Continue Xtandi 160 mg daily.  He is current with his ADT.

## 2022-07-13 DIAGNOSIS — D649 Anemia, unspecified: Secondary | ICD-10-CM | POA: Diagnosis not present

## 2022-07-13 LAB — CBC
HCT: 25 % — ABNORMAL LOW (ref 39.0–52.0)
Hemoglobin: 8 g/dL — ABNORMAL LOW (ref 13.0–17.0)
MCH: 28.9 pg (ref 26.0–34.0)
MCHC: 32 g/dL (ref 30.0–36.0)
MCV: 90.3 fL (ref 80.0–100.0)
Platelets: 720 10*3/uL — ABNORMAL HIGH (ref 150–400)
RBC: 2.77 MIL/uL — ABNORMAL LOW (ref 4.22–5.81)
RDW: 14.8 % (ref 11.5–15.5)
WBC: 8.3 10*3/uL (ref 4.0–10.5)
nRBC: 0 % (ref 0.0–0.2)

## 2022-07-13 LAB — GLUCOSE, CAPILLARY
Glucose-Capillary: 103 mg/dL — ABNORMAL HIGH (ref 70–99)
Glucose-Capillary: 129 mg/dL — ABNORMAL HIGH (ref 70–99)
Glucose-Capillary: 137 mg/dL — ABNORMAL HIGH (ref 70–99)
Glucose-Capillary: 155 mg/dL — ABNORMAL HIGH (ref 70–99)
Glucose-Capillary: 162 mg/dL — ABNORMAL HIGH (ref 70–99)
Glucose-Capillary: 165 mg/dL — ABNORMAL HIGH (ref 70–99)
Glucose-Capillary: 173 mg/dL — ABNORMAL HIGH (ref 70–99)
Glucose-Capillary: 176 mg/dL — ABNORMAL HIGH (ref 70–99)
Glucose-Capillary: 180 mg/dL — ABNORMAL HIGH (ref 70–99)
Glucose-Capillary: 184 mg/dL — ABNORMAL HIGH (ref 70–99)
Glucose-Capillary: 190 mg/dL — ABNORMAL HIGH (ref 70–99)
Glucose-Capillary: 193 mg/dL — ABNORMAL HIGH (ref 70–99)
Glucose-Capillary: 194 mg/dL — ABNORMAL HIGH (ref 70–99)
Glucose-Capillary: 209 mg/dL — ABNORMAL HIGH (ref 70–99)
Glucose-Capillary: 214 mg/dL — ABNORMAL HIGH (ref 70–99)
Glucose-Capillary: 215 mg/dL — ABNORMAL HIGH (ref 70–99)
Glucose-Capillary: 249 mg/dL — ABNORMAL HIGH (ref 70–99)
Glucose-Capillary: 296 mg/dL — ABNORMAL HIGH (ref 70–99)
Glucose-Capillary: 171 mg/dL — ABNORMAL HIGH (ref 70–99)

## 2022-07-13 LAB — URINE CULTURE: Culture: >=100000 — AB

## 2022-07-13 LAB — COMPREHENSIVE METABOLIC PANEL
ALT: 17 U/L (ref 0–44)
AST: 23 U/L (ref 15–41)
Albumin: 2 g/dL — ABNORMAL LOW (ref 3.5–5.0)
Alkaline Phosphatase: 353 U/L — ABNORMAL HIGH (ref 38–126)
Anion gap: 9 (ref 5–15)
BUN: 14 mg/dL (ref 6–20)
CO2: 25 mmol/L (ref 22–32)
Calcium: 8 mg/dL — ABNORMAL LOW (ref 8.9–10.3)
Chloride: 99 mmol/L (ref 98–111)
Creatinine, Ser: 1.1 mg/dL (ref 0.61–1.24)
GFR, Estimated: >60 mL/min (ref >60)
Glucose, Bld: 211 mg/dL — ABNORMAL HIGH (ref 70–99)
Potassium: 3.7 mmol/L (ref 3.5–5.1)
Sodium: 133 mmol/L — ABNORMAL LOW (ref 135–145)
Total Bilirubin: 0.3 mg/dL (ref 0.3–1.2)
Total Protein: 5.3 g/dL — ABNORMAL LOW (ref 6.5–8.1)

## 2022-07-13 LAB — MAGNESIUM: Magnesium: 1.8 mg/dL (ref 1.7–2.4)

## 2022-07-13 NOTE — Plan of Care (Signed)

## 2022-07-13 NOTE — Plan of Care (Signed)
Patient AOX4, VSS throughout shift.  All meds given on time as ordered.  Denied pain and SOB.  Diminished lungs, IS encouraged.  Pt ambulated around room, voided in bathroom.  POC maintained, will continue to monitor.  Problem: Education: Goal: Ability to describe self-care measures that may prevent or decrease complications (Diabetes Survival Skills Education) will improve Outcome: Progressing Goal: Individualized Educational Video(s) Outcome: Progressing   Problem: Coping: Goal: Ability to adjust to condition or change in health will improve Outcome: Progressing   Problem: Fluid Volume: Goal: Ability to maintain a balanced intake and output will improve Outcome: Progressing   Problem: Health Behavior/Discharge Planning: Goal: Ability to identify and utilize available resources and services will improve Outcome: Progressing Goal: Ability to manage health-related needs will improve Outcome: Progressing   Problem: Metabolic: Goal: Ability to maintain appropriate glucose levels will improve Outcome: Progressing   Problem: Nutritional: Goal: Maintenance of adequate nutrition will improve Outcome: Progressing Goal: Progress toward achieving an optimal weight will improve Outcome: Progressing   Problem: Skin Integrity: Goal: Risk for impaired skin integrity will decrease Outcome: Progressing   Problem: Tissue Perfusion: Goal: Adequacy of tissue perfusion will improve Outcome: Progressing   Problem: Education: Goal: Knowledge of General Education information will improve Description: Including pain rating scale, medication(s)/side effects and non-pharmacologic comfort measures Outcome: Progressing   Problem: Health Behavior/Discharge Planning: Goal: Ability to manage health-related needs will improve Outcome: Progressing   Problem: Clinical Measurements: Goal: Ability to maintain clinical measurements within normal limits will improve Outcome: Progressing Goal: Will  remain free from infection Outcome: Progressing Goal: Diagnostic test results will improve Outcome: Progressing Goal: Respiratory complications will improve Outcome: Progressing Goal: Cardiovascular complication will be avoided Outcome: Progressing   Problem: Activity: Goal: Risk for activity intolerance will decrease Outcome: Progressing   Problem: Nutrition: Goal: Adequate nutrition will be maintained Outcome: Progressing   Problem: Coping: Goal: Level of anxiety will decrease Outcome: Progressing   Problem: Elimination: Goal: Will not experience complications related to bowel motility Outcome: Progressing Goal: Will not experience complications related to urinary retention Outcome: Progressing   Problem: Pain Managment: Goal: General experience of comfort will improve Outcome: Progressing   Problem: Safety: Goal: Ability to remain free from injury will improve Outcome: Progressing   Problem: Skin Integrity: Goal: Risk for impaired skin integrity will decrease Outcome: Progressing

## 2022-07-13 NOTE — Progress Notes (Signed)
[name]    Date of Admission:  07/07/2022   Total days of antibiotics 6   ID: [name] is a 57 y.o. male with  MSSA bacteremia  Principal Problem:   Symptomatic anemia Active Problems:   Type 1 diabetes mellitus (HCC)   Asthma   Prostate cancer ([place])   Hyperlipidemia   Essential hypertension   Hematuria   Hyponatremia   Hypokalemia   AKI (acute kidney injury) ([place])    Subjective: Afebrile, tolerating abtx. Having poor sleep due to neighboring patients.  Medications:   amLODipine  5 mg Oral Daily   enzalutamide  160 mg Oral QHS   insulin aspart  0-9 Units Subcutaneous Q4H   insulin glargine-yfgn  25 Units Subcutaneous Daily   ramipril  10 mg Oral Daily   rosuvastatin  5 mg Oral Once per day on Mon Thu    Objective: Vital signs in last 24 hours: Temp:  [97.9 F (36.6 C)-98.7 F (37.1 C)] 98.7 F (37.1 C) (01/29 1957) Pulse Rate:  [80-83] 81 (01/29 1957) Resp:  [18] 18 (01/29 1957) BP: (139-149)/(65-75) 139/65 (01/29 1957) SpO2:  [98 %-100 %] 100 % (01/29 1957)  Physical Exam  Constitutional: He is oriented to person, place, and time. He appears well-developed and well-nourished. No distress.  HENT:  Mouth/Throat: Oropharynx is clear and moist. No oropharyngeal exudate.  Cardiovascular: Normal rate, regular rhythm and normal heart sounds. Exam reveals no gallop and no friction rub.  No murmur heard.  Pulmonary/Chest: Effort normal and breath sounds normal. No respiratory distress. He has no wheezes.  Abdominal: Soft. Bowel sounds are normal. He exhibits no distension. There is no tenderness.  Lymphadenopathy:  He has no cervical adenopathy.  Neurological: He is alert and oriented to person, place, and time.  Skin: Skin is warm and dry. No rash noted. No erythema.  Psychiatric: He has a normal mood and affect. His behavior is normal.    Lab Results Recent Labs    07/12/22 0609 07/13/22 0501  WBC 10.3 8.3  HGB 8.0*  8.0*  HCT 24.4* 25.0*  NA 133* 133*  K 3.3* 3.7  CL 98 99  CO2 25 25  BUN 16 14  CREATININE 1.19 1.10   Liver Panel Recent Labs    07/12/22 0609 07/13/22 0501  PROT 5.4* 5.3*  ALBUMIN 2.1* 2.0*  AST 23 23  ALT 22 17  ALKPHOS 411* 353*  BILITOT 0.2* 0.3   Sedimentation Rate No results for input(s): "ESRSEDRATE" in the last 72 hours. C-Reactive Protein No results for input(s): "CRP" in the last 72 hours.  Microbiology: Urine cx: efaecalis and MSSA Blood cx 1/26: NGTD Blood cx 1/25: MSSA Studies/Results: No results found.   Assessment/Plan: MSSA bacteremia, thought to be urinary source = continue with cefazolin 2gm IV Q8hr. Recommend to have TEE to decide length of treatment course. Will need picc line, which can place tomorrow.  [facility] Pager: [phone]  07/13/2022, 9:43 PM

## 2022-07-13 NOTE — Inpatient Diabetes Management (Addendum)
Inpatient Diabetes Program Recommendations  AACE/ADA: New Consensus Statement on Inpatient Glycemic Control (2015)  Target Ranges:  Prepandial:   less than 140 mg/dL      Peak postprandial:   less than 180 mg/dL (1-2 hours)      Critically ill patients:  140 - 180 mg/dL    Latest Reference Range & Units 07/12/22 00:10 07/12/22 04:46 07/12/22 07:35 07/12/22 11:46 07/12/22 16:02 07/12/22 16:48 07/12/22 20:06  Glucose-Capillary 70 - 99 mg/dL 193 (H)  2 units Novolog  215 (H)  3 units Novolog 173 (H)  2 units Novolog  25 units Semglee '@0943'$  129 (H)  1 unit Novolog 214 (H)  3 units Novolog 249 (H) 296 (H)  5 units Novolog  (H): Data is abnormally high  Latest Reference Range & Units 07/12/22 23:45 07/13/22 04:15 07/13/22 07:11  Glucose-Capillary 70 - 99 mg/dL 176 (H)  2 units Novolog 190 (H)  2 units Novolog 184 (H)  2 units Novolog  (H): Data is abnormally high     Home DM Meds: Lyumjev 0-12 units TID     Tresiba 40 units Daily   Current Orders: Semglee 25 units Daily      Novolog Sensitive Correction Scale/ SSI (0-9 units) Q4 hours     MD- Note pt ate 80-100% meals yesterday  Evening CBGs elevated  Please consider:  1. Change frequency of the Novolog SSI to TID AC + HS  2. Start Novolog Meal Coverage: Novolog 3 units TID with meals HOLD if pt NPO HOLD if pt eats <50% meals  3. Increase Semglee Insulin slightly to 27 units Daily    --Will follow patient during hospitalization--  Wyn Quaker RN, MSN, [place] [facility] Coordinator Inpatient Glycemic Control Team Team Pager: [phone] (8a-5p)

## 2022-07-13 NOTE — Progress Notes (Signed)
PROGRESS NOTE    [name]  WEX:[id number] DOB: Nov 13, 1965 DOA: 07/07/2022 PCP: [name], MD   Brief Narrative: 57 year old male with a history of  Prostate Cancer Anemia, DM type 1, radiation cystitis   Presented with  blood clot in urine Pt was sent to ER with Hg of 6.3  fatigued lightheaded SOB  had blood clots  No N/V no abd pain  He received 2 unit of packed RBC with improvement in his hemoglobin. In the ER hemoglobin 6.3 creatinine 1.7 sodium 127 potassium 3.4 Bcid mssa 1/3 Hb 7.4  Assessment & Plan:   Principal Problem:   Symptomatic anemia Active Problems:   Type 1 diabetes mellitus (HCC)   Asthma   Prostate cancer (HCC)   Hyperlipidemia   Essential hypertension   Hematuria   Hyponatremia   Hypokalemia   AKI (acute kidney injury) ([place])   #1 blood loss anemia/symptomatic anemia from gross hematuria likely related to radiation cystitis with history of prostate malignancy-received 3 unit of packed RBC total Hb 8.0 No further clot in the urine   #2 AKI - resolved renal ultrasound showed no urinary retention or obstruction.   Restarted ramipril    #3 type 1 diabetes continue long-acting insulin CBG (last 3)  Recent Labs    07/13/22 0415 07/13/22 0711 07/13/22 1113  GLUCAP 190* 184* 171*    #4 prostate cancer followed by Dr. Alinda Money [facility]  #5 essential hypertension- restart Norvasc  ramipril  #6 hyponatremia in the setting of dehydration and acute bleed Stable 133  #7 hypokalemia replete and recheck labs.  Check mag level 1.8  #8 hyperlipidemia on Crestor prior admission  #9 mssa bacteremia/uti-urine culture with Staph aureus and Enterococcus Blood culture with Staphylococcus -ancef started 1/26 Id consulted Echo normal ejection fraction Repeat blood culture 07/10/2022 no growth  Estimated body mass index is 30.11 kg/m as calculated from the following:   Height as of this encounter: '5\' 10"'$  (1.778 m).   Weight as of this  encounter: 95.2 kg.  DVT prophylaxis: None due to hematuria. Code Status: Full Family Communication: Discussed with wife  consultants:  None  Procedures: None  Antimicrobials: Ancef  Subjective: Urine has cleared up but no further blood clots He is very anxious to go home He prefers not to have a PICC line in place  Objective: Vitals:   07/12/22 1229 07/12/22 2009 07/13/22 0429 07/13/22 1249  BP: (!) 146/76 138/65 (!) 149/75 (!) 145/71  Pulse: 85 84 80 83  Resp: '18 18 18 18  '$ Temp: 97.8 F (36.6 C) 97.8 F (36.6 C) 97.9 F (36.6 C) 98.2 F (36.8 C)  TempSrc: Oral Oral Oral Oral  SpO2: 100% 99% 99% 98%  Weight:      Height:        Intake/Output Summary (Last 24 hours) at 07/13/2022 1316 Last data filed at 07/12/2022 2000 Gross per 24 hour  Intake 500 ml  Output --  Net 500 ml    Filed Weights   07/07/22 1915  Weight: 95.2 kg    Examination:  General exam: Appears in nad  Respiratory system: Clear to auscultation. Respiratory effort normal. Cardiovascular system: S1 & S2 heard, RRR. No JVD, murmurs, rubs, gallops or clicks. No pedal edema. Gastrointestinal system: Abdomen is nondistended, soft and tender. No organomegaly or masses felt. Normal bowel sounds heard. Central nervous system: Alert and oriented. No focal neurological deficits. Extremities: 1 plus b/l edema  Edema skin: No rashes, lesions or ulcers Psychiatry: Judgement  and insight appear normal. Mood & affect appropriate.   Data Reviewed: I have personally reviewed following labs and imaging studies  CBC: Recent Labs  Lab 07/07/22 1917 07/08/22 0545 07/08/22 1454 07/09/22 0514 07/10/22 0525 07/11/22 0631 07/12/22 0609 07/13/22 0501  WBC 8.5 8.1   < > 10.4 9.6 13.0* 10.3 8.3  NEUTROABS 6.7 6.2  --   --   --   --   --   --   HGB 6.3* 6.5*   < > 8.4* 7.4* 8.7* 8.0* 8.0*  HCT 19.1* 19.8*   < > 25.7* 22.6* 26.4* 24.4* 25.0*  MCV 89.7 90.8   < > 89.5 89.0 89.2 88.7 90.3  PLT 396 332   < >  458* 482* 582* 616* 720*   < > = values in this interval not displayed.    Basic Metabolic Panel: Recent Labs  Lab 07/08/22 1445 07/08/22 2358 07/09/22 0514 07/10/22 0525 07/11/22 0631 07/12/22 0609 07/13/22 0501  NA 133*  --  133* 134* 132* 133* 133*  K 3.5  --  3.3* 3.3* 3.4* 3.3* 3.7  CL 100  --  98 99 97* 98 99  CO2 22  --  '24 23 23 25 25  '$ GLUCOSE 256*  --  212* 136* 152* 218* 211*  BUN 33*  --  24* '18 16 16 14  '$ CREATININE 1.28*  --  1.39* 1.07 1.11 1.19 1.10  CALCIUM 7.9*  --  8.1* 8.1* 8.0* 8.0* 8.0*  MG 1.9 1.8  --  1.7 1.8 1.8 1.8  PHOS 1.7* 2.5  --   --   --   --   --     GFR: Estimated Creatinine Clearance: 87.9 mL/min (by C-G formula based on SCr of 1.1 mg/dL). Liver Function Tests: Recent Labs  Lab 07/09/22 0514 07/10/22 0525 07/11/22 0631 07/12/22 0609 07/13/22 0501  AST 40 32 33 23 23  ALT 42 33 36 22 17  ALKPHOS 286* 345* 514* 411* 353*  BILITOT 0.6 0.4 0.4 0.2* 0.3  PROT 6.1* 6.1* 6.3* 5.4* 5.3*  ALBUMIN 2.3* 2.3* 2.3* 2.1* 2.0*    No results for input(s): "LIPASE", "AMYLASE" in the last 168 hours. No results for input(s): "AMMONIA" in the last 168 hours. Coagulation Profile: No results for input(s): "INR", "PROTIME" in the last 168 hours. Cardiac Enzymes: Recent Labs  Lab 07/08/22 2358  CKTOTAL 18*    BNP (last 3 results) No results for input(s): "PROBNP" in the last 8760 hours. HbA1C: No results for input(s): "HGBA1C" in the last 72 hours. CBG: Recent Labs  Lab 07/12/22 2006 07/12/22 2345 07/13/22 0415 07/13/22 0711 07/13/22 1113  GLUCAP 296* 176* 190* 184* 171*    Lipid Profile: No results for input(s): "CHOL", "HDL", "LDLCALC", "TRIG", "CHOLHDL", "LDLDIRECT" in the last 72 hours. Thyroid Function Tests: No results for input(s): "TSH", "T4TOTAL", "FREET4", "T3FREE", "THYROIDAB" in the last 72 hours.  Anemia Panel: No results for input(s): "VITAMINB12", "FOLATE", "FERRITIN", "TIBC", "IRON", "RETICCTPCT" in the last 72  hours.  Sepsis Labs: Recent Labs  Lab 07/08/22 2358  LATICACIDVEN 0.9     Recent Results (from the past 240 hour(s))  Culture, blood (Routine X 2) w Reflex to ID Panel     Status: Abnormal   Collection Time: 07/09/22 10:23 AM   Specimen: BLOOD LEFT ARM  Result Value Ref Range Status   Specimen Description   Final    BLOOD LEFT ARM Performed at [facility] [address], [place], [place] [address]  Special Requests   Final    BOTTLES DRAWN AEROBIC AND ANAEROBIC Blood Culture adequate volume Performed at [facility] [address] [place], [place] [address]    Culture  Setup Time   Final    [facility] POSITIVE COCCI AEROBIC BOTTLE ONLY PHARMD M. Moreen Fowler 07/10/22 @ 0610 BY AB Performed at [facility], [place] [address], [place], [place] [address]    Culture STAPHYLOCOCCUS AUREUS (A)  Final   Report Status 07/12/2022 FINAL  Final   Organism ID, Bacteria STAPHYLOCOCCUS AUREUS  Final      Susceptibility   Staphylococcus aureus - MIC*    CIPROFLOXACIN <=0.5 SENSITIVE Sensitive     ERYTHROMYCIN <=0.25 SENSITIVE Sensitive     GENTAMICIN <=0.5 SENSITIVE Sensitive     OXACILLIN 0.5 SENSITIVE Sensitive     TETRACYCLINE <=1 SENSITIVE Sensitive     VANCOMYCIN <=0.5 SENSITIVE Sensitive     TRIMETH/SULFA <=10 SENSITIVE Sensitive     CLINDAMYCIN <=0.25 SENSITIVE Sensitive     RIFAMPIN <=0.5 SENSITIVE Sensitive     Inducible Clindamycin NEGATIVE Sensitive     * STAPHYLOCOCCUS AUREUS  Culture, blood (Routine X 2) w Reflex to ID Panel     Status: Abnormal   Collection Time: 07/09/22 10:23 AM   Specimen: BLOOD  Result Value Ref Range Status   Specimen Description   Final    BLOOD BLOOD RIGHT HAND Performed at [facility] [address] [place], [place] [address]    Special Requests   Final    BOTTLES DRAWN AEROBIC ONLY Blood Culture adequate volume Performed at [facility] [address], [place], [place]  [address]    Culture  Setup Time   Final    GRAM POSITIVE COCCI IN CLUSTERS AEROBIC BOTTLE ONLY CRITICAL VALUE NOTED.  VALUE IS CONSISTENT WITH PREVIOUSLY REPORTED AND CALLED VALUE.    Culture (A)  Final    STAPHYLOCOCCUS AUREUS SUSCEPTIBILITIES PERFORMED ON PREVIOUS CULTURE WITHIN THE LAST 5 DAYS. Performed at [facility], [place] [address], [place], [place] [address]    Report Status 07/12/2022 FINAL  Final  Blood Culture ID Panel (Reflexed)     Status: Abnormal   Collection Time: 07/09/22 10:23 AM  Result Value Ref Range Status   Enterococcus faecalis NOT DETECTED NOT DETECTED Final   Enterococcus Faecium NOT DETECTED NOT DETECTED Final   Listeria monocytogenes NOT DETECTED NOT DETECTED Final   Staphylococcus species DETECTED (A) NOT DETECTED Final    Comment: CRITICAL RESULT CALLED TO, READ BACK BY AND VERIFIED WITH: PHARMD M. SWAYNE 07/10/22 @ 0610 BY AB    Staphylococcus aureus (BCID) DETECTED (A) NOT DETECTED Final    Comment: CRITICAL RESULT CALLED TO, READ BACK BY AND VERIFIED WITH: PHARMD M. SWAYNE 07/10/22 @ 0610 BY AB    Staphylococcus epidermidis NOT DETECTED NOT DETECTED Final   Staphylococcus lugdunensis NOT DETECTED NOT DETECTED Final   Streptococcus species NOT DETECTED NOT DETECTED Final   Streptococcus agalactiae NOT DETECTED NOT DETECTED Final   Streptococcus pneumoniae NOT DETECTED NOT DETECTED Final   Streptococcus pyogenes NOT DETECTED NOT DETECTED Final   A.calcoaceticus-baumannii NOT DETECTED NOT DETECTED Final   Bacteroides fragilis NOT DETECTED NOT DETECTED Final   Enterobacterales NOT DETECTED NOT DETECTED Final   Enterobacter cloacae complex NOT DETECTED NOT DETECTED Final   Escherichia coli NOT DETECTED NOT DETECTED Final   Klebsiella aerogenes NOT DETECTED NOT DETECTED Final   Klebsiella oxytoca NOT DETECTED NOT DETECTED Final  Klebsiella pneumoniae NOT DETECTED NOT DETECTED Final   Proteus species NOT DETECTED NOT DETECTED Final   Salmonella  species NOT DETECTED NOT DETECTED Final   Serratia marcescens NOT DETECTED NOT DETECTED Final   Haemophilus influenzae NOT DETECTED NOT DETECTED Final   Neisseria meningitidis NOT DETECTED NOT DETECTED Final   Pseudomonas aeruginosa NOT DETECTED NOT DETECTED Final   Stenotrophomonas maltophilia NOT DETECTED NOT DETECTED Final   Candida albicans NOT DETECTED NOT DETECTED Final   Candida auris NOT DETECTED NOT DETECTED Final   Candida glabrata NOT DETECTED NOT DETECTED Final   Candida krusei NOT DETECTED NOT DETECTED Final   Candida parapsilosis NOT DETECTED NOT DETECTED Final   Candida tropicalis NOT DETECTED NOT DETECTED Final   Cryptococcus neoformans/gattii NOT DETECTED NOT DETECTED Final   Meth resistant mecA/C and MREJ NOT DETECTED NOT DETECTED Final    Comment: Performed at [facility], [place] [address], [place], [place] [address]  Urine Culture (for pregnant, neutropenic or urologic patients or patients with an indwelling urinary catheter)     Status: Abnormal   Collection Time: 07/09/22 11:20 AM   Specimen: Urine, Clean Catch  Result Value Ref Range Status   Specimen Description   Final    URINE, CLEAN CATCH Performed at [facility], [place] [address], [place], [place] [address]    Special Requests   Final    NONE Performed at [facility], [place] [address], [place], [place] [address]    Culture (A)  Final    >=100,000 COLONIES/mL STAPHYLOCOCCUS AUREUS >=100,000 COLONIES/mL ENTEROCOCCUS FAECALIS    Report Status 07/13/2022 FINAL  Final   Organism ID, Bacteria STAPHYLOCOCCUS AUREUS (A)  Final   Organism ID, Bacteria ENTEROCOCCUS FAECALIS (A)  Final      Susceptibility   Enterococcus faecalis - MIC*    AMPICILLIN <=2 SENSITIVE Sensitive     NITROFURANTOIN <=16 SENSITIVE Sensitive     VANCOMYCIN 1 SENSITIVE Sensitive     * >=100,000 COLONIES/mL ENTEROCOCCUS FAECALIS   Staphylococcus aureus - MIC*    CIPROFLOXACIN <=0.5 SENSITIVE  Sensitive     GENTAMICIN <=0.5 SENSITIVE Sensitive     NITROFURANTOIN <=16 SENSITIVE Sensitive     OXACILLIN <=0.25 SENSITIVE Sensitive     TETRACYCLINE <=1 SENSITIVE Sensitive     VANCOMYCIN <=0.5 SENSITIVE Sensitive     TRIMETH/SULFA <=10 SENSITIVE Sensitive     CLINDAMYCIN <=0.25 SENSITIVE Sensitive     RIFAMPIN <=0.5 SENSITIVE Sensitive     Inducible Clindamycin NEGATIVE Sensitive     * >=100,000 COLONIES/mL STAPHYLOCOCCUS AUREUS  Gastrointestinal Panel by PCR , Stool     Status: None   Collection Time: 07/10/22  9:22 AM   Specimen: STOOL  Result Value Ref Range Status   Campylobacter species NOT DETECTED NOT DETECTED Final   Plesimonas shigelloides NOT DETECTED NOT DETECTED Final   Salmonella species NOT DETECTED NOT DETECTED Final   Yersinia enterocolitica NOT DETECTED NOT DETECTED Final   Vibrio species NOT DETECTED NOT DETECTED Final   Vibrio cholerae NOT DETECTED NOT DETECTED Final   Enteroaggregative E coli (EAEC) NOT DETECTED NOT DETECTED Final   Enteropathogenic E coli (EPEC) NOT DETECTED NOT DETECTED Final   Enterotoxigenic E coli (ETEC) NOT DETECTED NOT DETECTED Final   Shiga like toxin producing E coli (STEC) NOT DETECTED NOT DETECTED Final   Shigella/Enteroinvasive E coli (EIEC) NOT DETECTED NOT DETECTED Final   Cryptosporidium NOT DETECTED NOT DETECTED Final   Cyclospora cayetanensis NOT DETECTED NOT DETECTED  Final   Entamoeba histolytica NOT DETECTED NOT DETECTED Final   Giardia lamblia NOT DETECTED NOT DETECTED Final   Adenovirus F40/41 NOT DETECTED NOT DETECTED Final   Astrovirus NOT DETECTED NOT DETECTED Final   Norovirus GI/GII NOT DETECTED NOT DETECTED Final   Rotavirus A NOT DETECTED NOT DETECTED Final   Sapovirus (I, II, IV, and V) NOT DETECTED NOT DETECTED Final    Comment: Performed at [facility], [place] [place], [place] [address]  Culture, blood (Routine X 2) w Reflex to ID Panel     Status: None (Preliminary result)   Collection  Time: 07/10/22  2:21 PM   Specimen: BLOOD  Result Value Ref Range Status   Specimen Description   Final    BLOOD BLOOD LEFT ARM AEROBIC BOTTLE ONLY ANAEROBIC BOTTLE ONLY Performed at [facility], [place] [address] [place], [place] [address]    Special Requests   Final    BOTTLES DRAWN AEROBIC AND ANAEROBIC Blood Culture adequate volume Performed at [facility] [address] [place], [place] [address]    Culture   Final    NO GROWTH 3 DAYS Performed at [facility], [place] [address], [place], [place] [address]    Report Status PENDING  Incomplete  Culture, blood (Routine X 2) w Reflex to ID Panel     Status: None (Preliminary result)   Collection Time: 07/10/22  2:24 PM   Specimen: BLOOD  Result Value Ref Range Status   Specimen Description   Final    BLOOD BLOOD LEFT HAND AEROBIC BOTTLE ONLY ANAEROBIC BOTTLE ONLY Performed at [facility] [address] [place], [place] [address]    Special Requests   Final    BOTTLES DRAWN AEROBIC AND ANAEROBIC Blood Culture results may not be optimal due to an inadequate volume of blood received in culture bottles Performed at [facility] [address] [place], [place] [address]    Culture   Final    NO GROWTH 3 DAYS Performed at [facility], [place] [address], [place], [place] [address]    Report Status PENDING  Incomplete         Radiology Studies: ECHOCARDIOGRAM COMPLETE  Result Date: 07/11/2022    ECHOCARDIOGRAM REPORT   Patient Name:   [name] Date of Exam: 07/11/2022 Medical Rec #:  [id number]     Height:       70.0 in Accession #:    [id number]    Weight:       209.9 lb Date of Birth:  20-May-1966     BSA:          2.131 m Patient Age:    75 years      BP:           176/70 mmHg Patient Gender: M             HR:           87 bpm. Exam Location:  Inpatient Procedure: 2D Echo, Cardiac Doppler and Color Doppler Indications:    Bacteremia  R78.81  History:        Patient has no prior history of Echocardiogram examinations.                 Risk Factors:Diabetes and Hypertension.  Sonographer:    Bernadene Person RDCS Referring Phys: [id number] [place]  1. Left ventricular ejection fraction, by estimation, is 60 to  65%. The left ventricle has normal function. The left ventricle has no regional wall motion abnormalities. Left ventricular diastolic parameters were normal.  2. Right ventricular systolic function is normal. The right ventricular size is normal. Tricuspid regurgitation signal is inadequate for assessing PA pressure.  3. Right atrial size was mildly dilated.  4. The mitral valve is normal in structure. Trivial mitral valve regurgitation. No evidence of mitral stenosis.  5. The aortic valve is tricuspid. Aortic valve regurgitation is not visualized. No aortic stenosis is present.  6. The inferior vena cava is normal in size with greater than 50% respiratory variability, suggesting right atrial pressure of 3 mmHg. Comparison(s): No prior Echocardiogram. FINDINGS  Left Ventricle: Left ventricular ejection fraction, by estimation, is 60 to 65%. The left ventricle has normal function. The left ventricle has no regional wall motion abnormalities. The left ventricular internal cavity size was normal in size. There is  no left ventricular hypertrophy. Left ventricular diastolic parameters were normal. Right Ventricle: The right ventricular size is normal. Right ventricular systolic function is normal. Tricuspid regurgitation signal is inadequate for assessing PA pressure. The tricuspid regurgitant velocity is 2.47 m/s, and with an assumed right atrial  pressure of 3 mmHg, the estimated right ventricular systolic pressure is 28.4 mmHg. Left Atrium: Left atrial size was normal in size. Right Atrium: Right atrial size was mildly dilated. Pericardium: There is no evidence of pericardial effusion. Mitral Valve: The mitral valve is  normal in structure. Trivial mitral valve regurgitation. No evidence of mitral valve stenosis. Tricuspid Valve: The tricuspid valve is normal in structure. Tricuspid valve regurgitation is trivial. No evidence of tricuspid stenosis. Aortic Valve: The aortic valve is tricuspid. Aortic valve regurgitation is not visualized. No aortic stenosis is present. Pulmonic Valve: The pulmonic valve was normal in structure. Pulmonic valve regurgitation is trivial. No evidence of pulmonic stenosis. Aorta: The aortic root is normal in size and structure. Venous: The inferior vena cava is normal in size with greater than 50% respiratory variability, suggesting right atrial pressure of 3 mmHg. IAS/Shunts: No atrial level shunt detected by color flow Doppler.  LEFT VENTRICLE PLAX 2D LVIDd:         5.00 cm      Diastology LVIDs:         3.40 cm      LV e' medial:    8.27 cm/s LV PW:         0.90 cm      LV E/e' medial:  13.3 LV IVS:        0.90 cm      LV e' lateral:   9.90 cm/s LVOT diam:     2.00 cm      LV E/e' lateral: 11.1 LV SV:         67 LV SV Index:   31 LVOT Area:     3.14 cm  LV Volumes (MOD) LV vol d, MOD A2C: 128.0 ml LV vol d, MOD A4C: 111.0 ml LV vol s, MOD A2C: 52.0 ml LV vol s, MOD A4C: 44.7 ml LV SV MOD A2C:     76.0 ml LV SV MOD A4C:     111.0 ml LV SV MOD BP:      72.3 ml RIGHT VENTRICLE RV S prime:     13.90 cm/s TAPSE (M-mode): 2.4 cm LEFT ATRIUM             Index        RIGHT ATRIUM  Index LA diam:        4.00 cm 1.88 cm/m   RA Area:     21.30 cm LA Vol (A2C):   48.3 ml 22.67 ml/m  RA Volume:   73.70 ml  34.59 ml/m LA Vol (A4C):   37.6 ml 17.65 ml/m LA Biplane Vol: 44.4 ml 20.84 ml/m  AORTIC VALVE LVOT Vmax:   113.00 cm/s LVOT Vmean:  75.800 cm/s LVOT VTI:    0.212 m  AORTA Ao Root diam: 3.40 cm Ao Asc diam:  3.00 cm MITRAL VALVE                TRICUSPID VALVE MV Area (PHT): 4.06 cm     TR Peak grad:   24.4 mmHg MV Decel Time: 187 msec     TR Vmax:        247.00 cm/s MV E velocity: 110.00 cm/s  MV A velocity: 74.90 cm/s   SHUNTS MV E/A ratio:  1.47         Systemic VTI:  0.21 m                             Systemic Diam: 2.00 cm Kirk Ruths MD Electronically signed by Kirk Ruths MD Signature Date/Time: 07/11/2022/3:00:12 PM    Final         Scheduled Meds:  amLODipine  5 mg Oral Daily   enzalutamide  160 mg Oral QHS   insulin aspart  0-9 Units Subcutaneous Q4H   insulin glargine-yfgn  25 Units Subcutaneous Daily   ramipril  10 mg Oral Daily   rosuvastatin  5 mg Oral Once per day on Mon Thu   Continuous Infusions:   ceFAZolin (ANCEF) IV 2 g (07/13/22 2080)      LOS: 5 days    Time spent: 37 min  Georgette Shell, MD 07/13/2022, 1:16 PM

## 2022-07-13 NOTE — Progress Notes (Signed)
Patient ID: [name], male   DOB: [birth year], 57 y.o.   MRN: [id number]    Subjective: Pt passed a few small clots over the weekend but urine has mostly been yellow/clear in his pads.  No blood transfusions since Friday for Hgb 7.4,  Objective: Vital signs in last 24 hours: Temp:  [97.8 F (36.6 C)-97.9 F (36.6 C)] 97.9 F (36.6 C) (01/29 0429) Pulse Rate:  [80-85] 80 (01/29 0429) Resp:  [18] 18 (01/29 0429) BP: (138-149)/(65-76) 149/75 (01/29 0429) SpO2:  [99 %-100 %] 99 % (01/29 0429)  Intake/Output from previous day: 01/28 0701 - 01/29 0700 In: 980 [P.O.:780; IV Piggyback:200] Out: -  Intake/Output this shift: No intake/output data recorded.  Physical Exam:  General: Alert and oriented  Lab Results: Recent Labs    07/11/22 0631 07/12/22 0609 07/13/22 0501  HGB 8.7* 8.0* 8.0*  HCT 26.4* 24.4* 25.0*   BMET Recent Labs    07/12/22 0609 07/13/22 0501  NA 133* 133*  K 3.3* 3.7  CL 98 99  CO2 25 25  GLUCOSE 218* 211*  BUN 16 14  CREATININE 1.19 1.10  CALCIUM 8.0* 8.0*     Studies/Results:   Assessment/Plan: 1) Metastatic prostate cancer:  Continue current therapy with ADT/Xtandi.  Has f/u in February with me to continue therapy. 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  No indication for catheter or other intervention.     LOS: 5 days   [name] 07/13/2022, 10:05 AM

## 2022-07-14 ENCOUNTER — Inpatient Hospital Stay: Payer: Self-pay

## 2022-07-14 DIAGNOSIS — D649 Anemia, unspecified: Secondary | ICD-10-CM | POA: Diagnosis not present

## 2022-07-14 LAB — BLOOD CULTURE ID PANEL (REFLEXED) - BCID2

## 2022-07-14 LAB — GLUCOSE, CAPILLARY
Glucose-Capillary: 182 mg/dL — ABNORMAL HIGH (ref 70–99)
Glucose-Capillary: 189 mg/dL — ABNORMAL HIGH (ref 70–99)
Glucose-Capillary: 186 mg/dL — ABNORMAL HIGH (ref 70–99)
Glucose-Capillary: 233 mg/dL — ABNORMAL HIGH (ref 70–99)
Glucose-Capillary: 191 mg/dL — ABNORMAL HIGH (ref 70–99)
Glucose-Capillary: 158 mg/dL — ABNORMAL HIGH (ref 70–99)

## 2022-07-14 LAB — CK: Total CK: 45 U/L — ABNORMAL LOW (ref 49–397)

## 2022-07-14 MED ORDER — SODIUM CHLORIDE 0.9 % IV SOLN
2.0000 g | INTRAVENOUS | Status: DC
  Administered 2022-07-14 (×2): 2 g via INTRAVENOUS
  Filled 2022-07-14 (×4): qty 2000

## 2022-07-14 MED ORDER — ALPRAZOLAM 0.25 MG PO TABS
0.2500 mg | ORAL_TABLET | Freq: Every day | ORAL | Status: DC
  Administered 2022-07-14 – 2022-07-15 (×2): 0.25 mg via ORAL
  Filled 2022-07-14 (×2): qty 1

## 2022-07-14 MED ORDER — SODIUM CHLORIDE 0.9 % IV SOLN: INTRAVENOUS | Status: DC

## 2022-07-14 MED ORDER — ENZALUTAMIDE 40 MG PO TABS
80.0000 mg | ORAL_TABLET | Freq: Two times a day (BID) | ORAL | Status: DC
  Administered 2022-07-14 – 2022-07-16 (×3): 80 mg via ORAL

## 2022-07-14 MED ORDER — SODIUM CHLORIDE 0.9 % IV SOLN
8.0000 mg/kg | Freq: Every day | INTRAVENOUS | Status: DC
  Administered 2022-07-14: 750 mg via INTRAVENOUS
  Filled 2022-07-14: qty 15

## 2022-07-14 MED ORDER — POTASSIUM CHLORIDE CRYS ER 20 MEQ PO TBCR
40.0000 meq | EXTENDED_RELEASE_TABLET | Freq: Once | ORAL | Status: AC
  Administered 2022-07-14: 40 meq via ORAL
  Filled 2022-07-14: qty 2

## 2022-07-14 NOTE — Progress Notes (Signed)
[facility] for Infectious Disease    Date of Admission:  07/07/2022   Total days of antibiotics 6           ID: [name] is a 57 y.o. male with  MSSA and efaecalis bacteremia from urinary source Principal Problem:   Symptomatic anemia Active Problems:   Type 1 diabetes mellitus (HCC)   Asthma   Prostate cancer ([place])   Hyperlipidemia   Essential hypertension   Hematuria   Hyponatremia   Hypokalemia   AKI (acute kidney injury) ([place])    Subjective: Afebrile but blood cx + for e.faecalis last night (day 4 -when +growth), started on ampicillin overnight without difficulty. Able to void standing up. Only had passed 1 blood clot last night  Medications:   ALPRAZolam  0.25 mg Oral QHS   amLODipine  5 mg Oral Daily   enzalutamide  80 mg Oral BID WC   insulin aspart  0-9 Units Subcutaneous Q4H   insulin glargine-yfgn  25 Units Subcutaneous Daily   ramipril  10 mg Oral Daily   rosuvastatin  5 mg Oral Once per day on Mon Thu    Objective: Vital signs in last 24 hours: Temp:  [97.8 F (36.6 C)-98.7 F (37.1 C)] 98.4 F (36.9 C) (01/30 1148) Pulse Rate:  [81-86] 86 (01/30 1148) Resp:  [17-19] 17 (01/30 1148) BP: (139-156)/(63-78) 140/63 (01/30 1148) SpO2:  [100 %] 100 % (01/30 1148)  Physical Exam  Constitutional: He is oriented to person, place, and time. He appears well-developed and well-nourished. No distress.  HENT:  Mouth/Throat: Oropharynx is clear and moist. No oropharyngeal exudate.  Cardiovascular: Normal rate, regular rhythm and normal heart sounds. Exam reveals no gallop and no friction rub.  No murmur heard.  Pulmonary/Chest: Effort normal and breath sounds normal. No respiratory distress. He has no wheezes.  Abdominal: Soft. Bowel sounds are normal. He exhibits no distension. There is no tenderness.  Lymphadenopathy:  He has no cervical adenopathy.  Neurological: He is alert and oriented to person, place, and time.  Skin: Skin is warm and dry. No  rash noted. No erythema.  Psychiatric: He has a normal mood and affect. His behavior is normal.    Lab Results Recent Labs    07/12/22 0609 07/13/22 0501  WBC 10.3 8.3  HGB 8.0* 8.0*  HCT 24.4* 25.0*  NA 133* 133*  K 3.3* 3.7  CL 98 99  CO2 25 25  BUN 16 14  CREATININE 1.19 1.10   Liver Panel Recent Labs    07/12/22 0609 07/13/22 0501  PROT 5.4* 5.3*  ALBUMIN 2.1* 2.0*  AST 23 23  ALT 22 17  ALKPHOS 411* 353*  BILITOT 0.2* 0.3    Microbiology: 1/26 blood cx +e.faecalis 1/25 blood cx MSSA 1/25 urine cx: MSSA and e.faecalis Studies/Results: [place] EKG SITE RITE  Result Date: 07/14/2022 If Site Rite image not attached, placement could not be confirmed due to current cardiac rhythm.    Assessment/Plan: Polymicrobial bacteremia = will narrow abtx to daptomycin at '8mg'$ /kg/day. Will check baseline ck. Postpone getting picc line until we have at least 48hrs of blood cx clearance and enterococcal coverage  -plan to get TEE tomorrow to decide length of treatment course  Complicated uti = currently on daptomycin to treat both pathogens.  I have personally spent 35 minutes involved in face-to-face and non-face-to-face activities for this patient on the day of the visit. Professional time spent includes the following activities: Preparing to see the  patient (review of tests), Obtaining and/or reviewing separately obtained history (admission/discharge record), Performing a medically appropriate examination and/or evaluation , Ordering medications/tests/procedures, referring and communicating with other health care professionals, Documenting clinical information in the EMR, Independently interpreting results (not separately reported), Communicating results to the patient/family/caregiver, Counseling and educating the patient/family/caregiver and Care coordination (not separately reported).      [facility] for Infectious Diseases Pager: [phone]  07/14/2022,  1:16 PM

## 2022-07-14 NOTE — Progress Notes (Signed)
PROGRESS NOTE    [name]  OMV:[id number] DOB: 02-22-1966 DOA: 07/07/2022 PCP: [name], MD   Brief Narrative: 57 year old male with a history of  Prostate Cancer Anemia, DM type 1, radiation cystitis   Presented with  blood clot in urine Pt was sent to ER with Hg of 6.3  fatigued lightheaded SOB  had blood clots.  He had 3 units of packed RBCs during this hospital stay with improvement in his hemoglobin. His urine has cleared up Initial blood culture positive for MSSA Repeat blood cultures positive for Enterococcus faecalis. ID was consulted and started daptomycin on 07/14/2022 CPK 45   Assessment & Plan:   Principal Problem:   Symptomatic anemia Active Problems:   Type 1 diabetes mellitus (HCC)   Asthma   Prostate cancer ([place])   Hyperlipidemia   Essential hypertension   Hematuria   Hyponatremia   Hypokalemia   AKI (acute kidney injury) ([place])   #1 blood loss anemia/symptomatic anemia from gross hematuria likely related to radiation cystitis with history of prostate malignancy-received 3 unit of packed RBC total during this hospital stay.  His urine has cleared up.  Hemoglobin is stable at 8.0 from 6.5 on admission. Urology following.  #2 AKI - resolved renal ultrasound showed no urinary retention or obstruction.   Restarted ramipril    #3 type 1 diabetes continue long-acting insulin CBG (last 3)  Recent Labs    07/14/22 0416 07/14/22 0725 07/14/22 1113  GLUCAP 233* 191* 158*    #4 prostate cancer followed by Dr. Alinda Money [facility]  #5 essential hypertension-continue Norvasc  ramipril  #6 hyponatremia in the setting of dehydration and acute bleed Stable 133  #7 hypokalemia replete and recheck labs.  Check mag level 1.8 Labs ordered for tomorrow  #8 hyperlipidemia on Crestor prior admission  #9  MSSA bacteremia/enterococcal bacteremia -was initially on cefazolin and received a dose of ampicillin overnight.  Antibiotics changed to daptomycin on  07/14/2022.   Patient will need a PICC line once blood cultures are cleared.   Blood culture from 07/09/2022 grew MSSA  Blood culture from 07/10/2022 grew Enterococcus  Repeat blood cultures done on 07/14/2022.   Transthoracic echo normal ejection fraction no mention of endocarditis. Transesophageal echo scheduled for 07/15/2022 [facility] cardiology. N.p.o. after midnight  Estimated body mass index is 30.11 kg/m as calculated from the following:   Height as of this encounter: '5\' 10"'$  (1.778 m).   Weight as of this encounter: 95.2 kg.  DVT prophylaxis: None due to hematuria. Code Status: Full Family Communication: Discussed with wife  consultants:  None  Procedures: None  Antimicrobials:  Anti-infectives (From admission, onward)    Start     Dose/Rate Route Frequency Ordered Stop   07/14/22 1600  DAPTOmycin (CUBICIN) 750 mg in sodium chloride 0.9 % IVPB        8 mg/kg  95.2 kg 130 mL/hr over 30 Minutes Intravenous Daily 07/14/22 1206     07/14/22 0400  ampicillin (OMNIPEN) 2 g in sodium chloride 0.9 % 100 mL IVPB  Status:  Discontinued        2 g 300 mL/hr over 20 Minutes Intravenous Every 4 hours 07/14/22 0314 07/14/22 1206   07/10/22 0800  ceFAZolin (ANCEF) IVPB 2g/100 mL premix  Status:  Discontinued        2 g 200 mL/hr over 30 Minutes Intravenous Every 8 hours 07/10/22 0712 07/14/22 1206   07/09/22 1000  cefTRIAXone (ROCEPHIN) 1 g in sodium chloride 0.9 %  100 mL IVPB  Status:  Discontinued        1 g 200 mL/hr over 30 Minutes Intravenous Every 24 hours 07/09/22 0915 07/10/22 0712       Subjective:  Repeat blood culture growing Enterococcus TEE scheduled for 07/15/2022 N.p.o. after midnight Urine has cleared up Objective: Vitals:   07/13/22 1249 07/13/22 1957 07/14/22 0416 07/14/22 1148  BP: (!) 145/71 139/65 (!) 156/78 (!) 140/63  Pulse: 83 81 86 86  Resp: '18 18 19 17  '$ Temp: 98.2 F (36.8 C) 98.7 F (37.1 C) 97.8 F (36.6 C) 98.4 F (36.9 C)  TempSrc: Oral Oral  Oral Oral  SpO2: 98% 100% 100% 100%  Weight:      Height:        Intake/Output Summary (Last 24 hours) at 07/14/2022 1433 Last data filed at 07/13/2022 2113 Gross per 24 hour  Intake 120 ml  Output --  Net 120 ml    Filed Weights   07/07/22 1915  Weight: 95.2 kg    Examination:  General exam: Appears in nad  Respiratory system: Clear to auscultation. Respiratory effort normal. Cardiovascular system: S1 & S2 heard, RRR. No JVD, murmurs, rubs, gallops or clicks. No pedal edema. Gastrointestinal system: Abdomen is nondistended, soft and tender. No organomegaly or masses felt. Normal bowel sounds heard. Central nervous system: Alert and oriented. No focal neurological deficits. Extremities: 1 plus b/l edema  Edema skin: No rashes, lesions or ulcers Psychiatry: Judgement and insight appear normal. Mood & affect appropriate.   Data Reviewed: I have personally reviewed following labs and imaging studies  CBC: Recent Labs  Lab 07/07/22 1917 07/08/22 0545 07/08/22 1454 07/09/22 0514 07/10/22 0525 07/11/22 0631 07/12/22 0609 07/13/22 0501  WBC 8.5 8.1   < > 10.4 9.6 13.0* 10.3 8.3  NEUTROABS 6.7 6.2  --   --   --   --   --   --   HGB 6.3* 6.5*   < > 8.4* 7.4* 8.7* 8.0* 8.0*  HCT 19.1* 19.8*   < > 25.7* 22.6* 26.4* 24.4* 25.0*  MCV 89.7 90.8   < > 89.5 89.0 89.2 88.7 90.3  PLT 396 332   < > 458* 482* 582* 616* 720*   < > = values in this interval not displayed.    Basic Metabolic Panel: Recent Labs  Lab 07/08/22 1445 07/08/22 2358 07/09/22 0514 07/10/22 0525 07/11/22 0631 07/12/22 0609 07/13/22 0501  NA 133*  --  133* 134* 132* 133* 133*  K 3.5  --  3.3* 3.3* 3.4* 3.3* 3.7  CL 100  --  98 99 97* 98 99  CO2 22  --  '24 23 23 25 25  '$ GLUCOSE 256*  --  212* 136* 152* 218* 211*  BUN 33*  --  24* '18 16 16 14  '$ CREATININE 1.28*  --  1.39* 1.07 1.11 1.19 1.10  CALCIUM 7.9*  --  8.1* 8.1* 8.0* 8.0* 8.0*  MG 1.9 1.8  --  1.7 1.8 1.8 1.8  PHOS 1.7* 2.5  --   --   --   --    --     GFR: Estimated Creatinine Clearance: 87.9 mL/min (by C-G formula based on SCr of 1.1 mg/dL). Liver Function Tests: Recent Labs  Lab 07/09/22 0514 07/10/22 0525 07/11/22 0631 07/12/22 0609 07/13/22 0501  AST 40 32 33 23 23  ALT 42 33 36 22 17  ALKPHOS 286* 345* 514* 411* 353*  BILITOT 0.6 0.4 0.4 0.2* 0.3  PROT 6.1* 6.1* 6.3* 5.4* 5.3*  ALBUMIN 2.3* 2.3* 2.3* 2.1* 2.0*    No results for input(s): "LIPASE", "AMYLASE" in the last 168 hours. No results for input(s): "AMMONIA" in the last 168 hours. Coagulation Profile: No results for input(s): "INR", "PROTIME" in the last 168 hours. Cardiac Enzymes: Recent Labs  Lab 07/08/22 2358 07/14/22 1228  CKTOTAL 18* 45*    BNP (last 3 results) No results for input(s): "PROBNP" in the last 8760 hours. HbA1C: No results for input(s): "HGBA1C" in the last 72 hours. CBG: Recent Labs  Lab 07/13/22 1958 07/13/22 2327 07/14/22 0416 07/14/22 0725 07/14/22 1113  GLUCAP 189* 186* 233* 191* 158*    Lipid Profile: No results for input(s): "CHOL", "HDL", "LDLCALC", "TRIG", "CHOLHDL", "LDLDIRECT" in the last 72 hours. Thyroid Function Tests: No results for input(s): "TSH", "T4TOTAL", "FREET4", "T3FREE", "THYROIDAB" in the last 72 hours.  Anemia Panel: No results for input(s): "VITAMINB12", "FOLATE", "FERRITIN", "TIBC", "IRON", "RETICCTPCT" in the last 72 hours.  Sepsis Labs: Recent Labs  Lab 07/08/22 2358  LATICACIDVEN 0.9     Recent Results (from the past 240 hour(s))  Culture, blood (Routine X 2) w Reflex to ID Panel     Status: Abnormal   Collection Time: 07/09/22 10:23 AM   Specimen: BLOOD LEFT ARM  Result Value Ref Range Status   Specimen Description   Final    BLOOD LEFT ARM Performed at [facility] [address] [place], [place] [address]    Special Requests   Final    BOTTLES DRAWN AEROBIC AND ANAEROBIC Blood Culture adequate volume Performed at [facility] [address], [place], [place] [address]    Culture  Setup Time   Final    [facility] POSITIVE COCCI AEROBIC BOTTLE ONLY PHARMD M. Moreen Fowler 07/10/22 @ 0610 BY AB Performed at [facility], [place] [address], [place], [place] [address]    Culture STAPHYLOCOCCUS AUREUS (A)  Final   Report Status 07/12/2022 FINAL  Final   Organism ID, Bacteria STAPHYLOCOCCUS AUREUS  Final      Susceptibility   Staphylococcus aureus - MIC*    CIPROFLOXACIN <=0.5 SENSITIVE Sensitive     ERYTHROMYCIN <=0.25 SENSITIVE Sensitive     GENTAMICIN <=0.5 SENSITIVE Sensitive     OXACILLIN 0.5 SENSITIVE Sensitive     TETRACYCLINE <=1 SENSITIVE Sensitive     VANCOMYCIN <=0.5 SENSITIVE Sensitive     TRIMETH/SULFA <=10 SENSITIVE Sensitive     CLINDAMYCIN <=0.25 SENSITIVE Sensitive     RIFAMPIN <=0.5 SENSITIVE Sensitive     Inducible Clindamycin NEGATIVE Sensitive     * STAPHYLOCOCCUS AUREUS  Culture, blood (Routine X 2) w Reflex to ID Panel     Status: Abnormal   Collection Time: 07/09/22 10:23 AM   Specimen: BLOOD  Result Value Ref Range Status   Specimen Description   Final    BLOOD BLOOD RIGHT HAND Performed at [facility] [address], [place], [place] [address]    Special Requests   Final    BOTTLES DRAWN AEROBIC ONLY Blood Culture adequate volume Performed at [facility] [address], [place], [place] [address]    Culture  Setup Time   Final    GRAM POSITIVE COCCI IN CLUSTERS AEROBIC BOTTLE ONLY CRITICAL VALUE NOTED.  VALUE IS CONSISTENT WITH PREVIOUSLY REPORTED AND CALLED VALUE.    Culture (A)  Final    STAPHYLOCOCCUS AUREUS SUSCEPTIBILITIES PERFORMED ON PREVIOUS CULTURE WITHIN THE LAST 5  DAYS. Performed at [facility], [place] [address], [place], [place] [address]    Report Status 07/12/2022 FINAL  Final  Blood Culture ID Panel (Reflexed)     Status: Abnormal   Collection Time: 07/09/22 10:23 AM  Result Value Ref Range Status   Enterococcus faecalis NOT DETECTED  NOT DETECTED Final   Enterococcus Faecium NOT DETECTED NOT DETECTED Final   Listeria monocytogenes NOT DETECTED NOT DETECTED Final   Staphylococcus species DETECTED (A) NOT DETECTED Final    Comment: CRITICAL RESULT CALLED TO, READ BACK BY AND VERIFIED WITH: PHARMD M. SWAYNE 07/10/22 @ 0610 BY AB    Staphylococcus aureus (BCID) DETECTED (A) NOT DETECTED Final    Comment: CRITICAL RESULT CALLED TO, READ BACK BY AND VERIFIED WITH: PHARMD M. SWAYNE 07/10/22 @ 0610 BY AB    Staphylococcus epidermidis NOT DETECTED NOT DETECTED Final   Staphylococcus lugdunensis NOT DETECTED NOT DETECTED Final   Streptococcus species NOT DETECTED NOT DETECTED Final   Streptococcus agalactiae NOT DETECTED NOT DETECTED Final   Streptococcus pneumoniae NOT DETECTED NOT DETECTED Final   Streptococcus pyogenes NOT DETECTED NOT DETECTED Final   A.calcoaceticus-baumannii NOT DETECTED NOT DETECTED Final   Bacteroides fragilis NOT DETECTED NOT DETECTED Final   Enterobacterales NOT DETECTED NOT DETECTED Final   Enterobacter cloacae complex NOT DETECTED NOT DETECTED Final   Escherichia coli NOT DETECTED NOT DETECTED Final   Klebsiella aerogenes NOT DETECTED NOT DETECTED Final   Klebsiella oxytoca NOT DETECTED NOT DETECTED Final   Klebsiella pneumoniae NOT DETECTED NOT DETECTED Final   Proteus species NOT DETECTED NOT DETECTED Final   Salmonella species NOT DETECTED NOT DETECTED Final   Serratia marcescens NOT DETECTED NOT DETECTED Final   Haemophilus influenzae NOT DETECTED NOT DETECTED Final   Neisseria meningitidis NOT DETECTED NOT DETECTED Final   Pseudomonas aeruginosa NOT DETECTED NOT DETECTED Final   Stenotrophomonas maltophilia NOT DETECTED NOT DETECTED Final   Candida albicans NOT DETECTED NOT DETECTED Final   Candida auris NOT DETECTED NOT DETECTED Final   Candida glabrata NOT DETECTED NOT DETECTED Final   Candida krusei NOT DETECTED NOT DETECTED Final   Candida parapsilosis NOT DETECTED NOT DETECTED Final    Candida tropicalis NOT DETECTED NOT DETECTED Final   Cryptococcus neoformans/gattii NOT DETECTED NOT DETECTED Final   Meth resistant mecA/C and MREJ NOT DETECTED NOT DETECTED Final    Comment: Performed at [facility], [address], [place], [place] [address]  Urine Culture (for pregnant, neutropenic or urologic patients or patients with an indwelling urinary catheter)     Status: Abnormal   Collection Time: 07/09/22 11:20 AM   Specimen: Urine, Clean Catch  Result Value Ref Range Status   Specimen Description   Final    URINE, CLEAN CATCH Performed at [facility], [place] [address] [place], [place] [address]    Special Requests   Final    NONE Performed at [facility], [place] [address] [place], [place] [address]    Culture (A)  Final    >=100,000 COLONIES/mL STAPHYLOCOCCUS AUREUS >=100,000 COLONIES/mL ENTEROCOCCUS FAECALIS    Report Status 07/13/2022 FINAL  Final   Organism ID, Bacteria STAPHYLOCOCCUS AUREUS (A)  Final   Organism ID, Bacteria ENTEROCOCCUS FAECALIS (A)  Final      Susceptibility   Enterococcus faecalis - MIC*    AMPICILLIN <=2 SENSITIVE Sensitive     NITROFURANTOIN <=16 SENSITIVE Sensitive     VANCOMYCIN 1 SENSITIVE Sensitive     * >=  100,000 COLONIES/mL ENTEROCOCCUS FAECALIS   Staphylococcus aureus - MIC*    CIPROFLOXACIN <=0.5 SENSITIVE Sensitive     GENTAMICIN <=0.5 SENSITIVE Sensitive     NITROFURANTOIN <=16 SENSITIVE Sensitive     OXACILLIN <=0.25 SENSITIVE Sensitive     TETRACYCLINE <=1 SENSITIVE Sensitive     VANCOMYCIN <=0.5 SENSITIVE Sensitive     TRIMETH/SULFA <=10 SENSITIVE Sensitive     CLINDAMYCIN <=0.25 SENSITIVE Sensitive     RIFAMPIN <=0.5 SENSITIVE Sensitive     Inducible Clindamycin NEGATIVE Sensitive     * >=100,000 COLONIES/mL STAPHYLOCOCCUS AUREUS  Gastrointestinal Panel by PCR , Stool     Status: None   Collection Time: 07/10/22  9:22 AM   Specimen: STOOL  Result Value Ref Range Status    Campylobacter species NOT DETECTED NOT DETECTED Final   Plesimonas shigelloides NOT DETECTED NOT DETECTED Final   Salmonella species NOT DETECTED NOT DETECTED Final   Yersinia enterocolitica NOT DETECTED NOT DETECTED Final   Vibrio species NOT DETECTED NOT DETECTED Final   Vibrio cholerae NOT DETECTED NOT DETECTED Final   Enteroaggregative E coli (EAEC) NOT DETECTED NOT DETECTED Final   Enteropathogenic E coli (EPEC) NOT DETECTED NOT DETECTED Final   Enterotoxigenic E coli (ETEC) NOT DETECTED NOT DETECTED Final   Shiga like toxin producing E coli (STEC) NOT DETECTED NOT DETECTED Final   Shigella/Enteroinvasive E coli (EIEC) NOT DETECTED NOT DETECTED Final   Cryptosporidium NOT DETECTED NOT DETECTED Final   Cyclospora cayetanensis NOT DETECTED NOT DETECTED Final   Entamoeba histolytica NOT DETECTED NOT DETECTED Final   Giardia lamblia NOT DETECTED NOT DETECTED Final   Adenovirus F40/41 NOT DETECTED NOT DETECTED Final   Astrovirus NOT DETECTED NOT DETECTED Final   Norovirus GI/GII NOT DETECTED NOT DETECTED Final   Rotavirus A NOT DETECTED NOT DETECTED Final   Sapovirus (I, II, IV, and V) NOT DETECTED NOT DETECTED Final    Comment: Performed at [facility], [place] [address] [place], [place] [address]  Culture, blood (Routine X 2) w Reflex to ID Panel     Status: None (Preliminary result)   Collection Time: 07/10/22  2:21 PM   Specimen: BLOOD  Result Value Ref Range Status   Specimen Description   Final    BLOOD BLOOD LEFT ARM AEROBIC BOTTLE ONLY ANAEROBIC BOTTLE ONLY Performed at [facility], [place] [address], [place], [place] [address]    Special Requests   Final    BOTTLES DRAWN AEROBIC AND ANAEROBIC Blood Culture adequate volume Performed at [facility] [address], [place], [place] [address]    Culture   Final    NO GROWTH 4 DAYS Performed at [facility], [place] [address] [place], [place] [address]    Report Status  PENDING  Incomplete  Culture, blood (Routine X 2) w Reflex to ID Panel     Status: None (Preliminary result)   Collection Time: 07/10/22  2:24 PM   Specimen: BLOOD  Result Value Ref Range Status   Specimen Description   Final    BLOOD BLOOD LEFT HAND AEROBIC BOTTLE ONLY ANAEROBIC BOTTLE ONLY Performed at [facility] [address], [place],  [address]    Special Requests   Final    BOTTLES DRAWN AEROBIC AND ANAEROBIC Blood Culture results may not be optimal due to an inadequate volume of blood received in culture bottles Performed at [facility] [address], [place],  [address]  Culture  Setup Time   Final    GRAM POSITIVE COCCI IN PAIRS IN CHAINS ANAEROBIC BOTTLE ONLY CRITICAL RESULT CALLED TO, READ BACK BY AND VERIFIED WITH: PHARMD L. POINTDEXTER 07/14/22 @ 0255 BY AB Performed at [facility], [place] [address], [place], [place] [address]    Culture GRAM POSITIVE COCCI  Final   Report Status PENDING  Incomplete  Blood Culture ID Panel (Reflexed)     Status: Abnormal   Collection Time: 07/10/22  2:24 PM  Result Value Ref Range Status   Enterococcus faecalis DETECTED (A) NOT DETECTED Final    Comment: CRITICAL RESULT CALLED TO, READ BACK BY AND VERIFIED WITH: PHARMD L. POINTDEXTER 07/14/22 @ 0255 BY AB    Enterococcus Faecium NOT DETECTED NOT DETECTED Final   Listeria monocytogenes NOT DETECTED NOT DETECTED Final   Staphylococcus species NOT DETECTED NOT DETECTED Final   Staphylococcus aureus (BCID) NOT DETECTED NOT DETECTED Final   Staphylococcus epidermidis NOT DETECTED NOT DETECTED Final   Staphylococcus lugdunensis NOT DETECTED NOT DETECTED Final   Streptococcus species NOT DETECTED NOT DETECTED Final   Streptococcus agalactiae NOT DETECTED NOT DETECTED Final   Streptococcus pneumoniae NOT DETECTED NOT DETECTED Final   Streptococcus pyogenes NOT DETECTED NOT DETECTED Final   A.calcoaceticus-baumannii NOT  DETECTED NOT DETECTED Final   Bacteroides fragilis NOT DETECTED NOT DETECTED Final   Enterobacterales NOT DETECTED NOT DETECTED Final   Enterobacter cloacae complex NOT DETECTED NOT DETECTED Final   Escherichia coli NOT DETECTED NOT DETECTED Final   Klebsiella aerogenes NOT DETECTED NOT DETECTED Final   Klebsiella oxytoca NOT DETECTED NOT DETECTED Final   Klebsiella pneumoniae NOT DETECTED NOT DETECTED Final   Proteus species NOT DETECTED NOT DETECTED Final   Salmonella species NOT DETECTED NOT DETECTED Final   Serratia marcescens NOT DETECTED NOT DETECTED Final   Haemophilus influenzae NOT DETECTED NOT DETECTED Final   Neisseria meningitidis NOT DETECTED NOT DETECTED Final   Pseudomonas aeruginosa NOT DETECTED NOT DETECTED Final   Stenotrophomonas maltophilia NOT DETECTED NOT DETECTED Final   Candida albicans NOT DETECTED NOT DETECTED Final   Candida auris NOT DETECTED NOT DETECTED Final   Candida glabrata NOT DETECTED NOT DETECTED Final   Candida krusei NOT DETECTED NOT DETECTED Final   Candida parapsilosis NOT DETECTED NOT DETECTED Final   Candida tropicalis NOT DETECTED NOT DETECTED Final   Cryptococcus neoformans/gattii NOT DETECTED NOT DETECTED Final   Vancomycin resistance NOT DETECTED NOT DETECTED Final    Comment: Performed at [facility], [address], [place], [place] [address]         Radiology Studies: [place] EKG SITE RITE  Result Date: 07/14/2022 If Site Rite image not attached, placement could not be confirmed due to current cardiac rhythm.       Scheduled Meds:  ALPRAZolam  0.25 mg Oral QHS   amLODipine  5 mg Oral Daily   enzalutamide  80 mg Oral BID WC   insulin aspart  0-9 Units Subcutaneous Q4H   insulin glargine-yfgn  25 Units Subcutaneous Daily   ramipril  10 mg Oral Daily   rosuvastatin  5 mg Oral Once per day on Mon Thu   Continuous Infusions:  DAPTOmycin (CUBICIN) 750 mg in sodium chloride 0.9 % IVPB        LOS: 6 days    Time  spent: 37 min  Georgette Shell, MD 07/14/2022, 2:33 PM

## 2022-07-14 NOTE — Anesthesia Preprocedure Evaluation (Signed)
Anesthesia Evaluation  Patient identified by MRN, date of birth, ID band Patient awake    Reviewed: Allergy & Precautions, NPO status , Patient's Chart, lab work & pertinent test results  Airway Mallampati: II  TM Distance: >3 FB Neck ROM: Full    Dental no notable dental hx. (+) Teeth Intact, Dental Advisory Given   Pulmonary former smoker   Pulmonary exam normal breath sounds clear to auscultation       Cardiovascular hypertension, Normal cardiovascular exam Rhythm:Regular Rate:Normal  07/11/2022 Echo   1. Left ventricular ejection fraction, by estimation, is 60 to 65%. The  left ventricle has normal function. The left ventricle has no regional  wall motion abnormalities. Left ventricular diastolic parameters were  normal.   2. Right ventricular systolic function is normal. The right ventricular  size is normal. Tricuspid regurgitation signal is inadequate for assessing  PA pressure.   3. Right atrial size was mildly dilated.   4. The mitral valve is normal in structure. Trivial mitral valve  regurgitation. No evidence of mitral stenosis.   5. The aortic valve is tricuspid. Aortic valve regurgitation is not  visualized. No aortic stenosis is present.   6. The inferior vena cava is normal in size with greater than 50%  respiratory variability, suggesting right atrial pressure of 3 mmHg.     Neuro/Psych negative neurological ROS  negative psych ROS   GI/Hepatic   Endo/Other  diabetes, Well Controlled, Type 1    Renal/GU Lab Results      Component                Value               Date                      CREATININE               1.10                07/13/2022                     K                        3.7                 07/13/2022                   Musculoskeletal   Abdominal   Peds  Hematology   Anesthesia Other Findings   Reproductive/Obstetrics                              Anesthesia Physical Anesthesia Plan  ASA: 3  Anesthesia Plan: MAC   Post-op Pain Management: Minimal or no pain anticipated   Induction:   PONV Risk Score and Plan: Treatment may vary due to age or medical condition and Propofol infusion  Airway Management Planned: Natural Airway and Nasal Cannula  Additional Equipment: None  Intra-op Plan:   Post-operative Plan:   Informed Consent: I have reviewed the patients History and Physical, chart, labs and discussed the procedure including the risks, benefits and alternatives for the proposed anesthesia with the patient or authorized representative who has indicated his/her understanding and acceptance.     Dental advisory given  Plan Discussed with: CRNA, Anesthesiologist and Surgeon  Anesthesia Plan Comments:  Anesthesia Quick Evaluation  

## 2022-07-14 NOTE — Consult Note (Signed)
CARDIOLOGY CONSULT NOTE  Patient ID: SALADIN PETRELLI MRN: [id number] DOB/AGE: March 30, 1966 57 y.o.  Admit date: 07/07/2022 Referring Physician  Dr Rodena Piety Primary Physician:  [name], MD Reason for Consultation  bacteremia  Patient ID: [name], male    DOB: 08-25-1965, 57 y.o.   MRN: [id number]  Chief Complaint  Patient presents with   Abnormal Lab   HPI:    [name]  is a 57 y.o. male with past medical history significant for prostate cancer and radiation cystitis who presented to the hospital with dizziness and feeling light-headed. He had gross hematuria from blood clots in his bladder and required transfusion with 3 units of blood. Patient now feeling well with stable hemoglobin. He was found to have bacteremia from a bladder infection and cardiology was consulted to perform TEE and rule out infective endocarditis. No vegetations were noted on any of his valves and his heart function is preserved. Patient denies chest pain, shortness of breath, palpitations, diaphoresis, syncope, edema, PND, orthopnea.   Past Medical History:  Diagnosis Date   Asthma    Cancer ([place])    prostate cancer    Diabetes mellitus without complication ([place])    History of bronchitis    Hypertension    Prostate cancer ([place])    Wears glasses    Past Surgical History:  Procedure Laterality Date   CYSTOSCOPY WITH FULGERATION N/A 03/21/2022   Procedure: CYSTOSCOPY WITH FULGERATION;  Surgeon: Janith Lima, MD;  Location: [facility];  Service: Urology;  Laterality: N/A;   CYSTOSCOPY WITH FULGERATION N/A 06/22/2022   Procedure: CYSTOSCOPY WITH FULGERATION;  Surgeon: Raynelle Bring, MD;  Location: [facility];  Service: Urology;  Laterality: N/A;  30 MINS FOR CASE   EYE SURGERY     laser eye surgery bilat    left groin hernia      LYMPHADENECTOMY Bilateral 09/02/2015   Procedure: BILATERAL LYMPHADENECTOMY;  Surgeon: Raynelle Bring, MD;  Location: [facility];  Service: Urology;  Laterality: Bilateral;   ROBOT  ASSISTED LAPAROSCOPIC RADICAL PROSTATECTOMY N/A 09/02/2015   Procedure: XI ROBOTIC ASSISTED LAPAROSCOPIC RADICAL PROSTATECTOMY LEVEL 2;  Surgeon: Raynelle Bring, MD;  Location: [facility];  Service: Urology;  Laterality: N/A;   schwannoma     removed approx 16 to 18 years ago    TONSILLECTOMY     Social History   Tobacco Use   Smoking status: Former    Packs/day: 0.50    Years: 7.00    Total pack years: 3.50    Types: Cigarettes    Quit date: 06/15/1998    Years since quitting: 24.0   Smokeless tobacco: Never  Substance Use Topics   Alcohol use: Yes    Comment: beer daily Monday through Friday 5 beers through the weekends    History reviewed. No pertinent family history.  Marital Status: Married  ROS  Review of Systems  Cardiovascular:  Negative for chest pain, claudication, dyspnea on exertion, irregular heartbeat, leg swelling, near-syncope, palpitations and paroxysmal nocturnal dyspnea.   Objective      07/14/2022   11:48 AM 07/14/2022    4:16 AM 07/13/2022    7:57 PM  Vitals with BMI  Systolic 144 315 400  Diastolic 63 78 65  Pulse 86 86 81    Blood pressure (!) 140/63, pulse 86, temperature 98.4 F (36.9 C), temperature source Oral, resp. rate 17, height '5\' 10"'$  (1.778 m), weight 95.2 kg, SpO2 100 %.    Physical Exam Vitals reviewed.  HENT:  Head: Normocephalic and atraumatic.  Cardiovascular:     Rate and Rhythm: Normal rate and regular rhythm.     Pulses: Normal pulses.     Heart sounds: Normal heart sounds. No murmur heard. Pulmonary:     Effort: Pulmonary effort is normal.  Abdominal:     General: Bowel sounds are normal.  Musculoskeletal:     Right lower leg: No edema.     Left lower leg: No edema.  Skin:    General: Skin is warm and dry.  Neurological:     Mental Status: He is alert.    Laboratory examination:   Recent Labs    07/11/22 0631 07/12/22 0609 07/13/22 0501  NA 132* 133* 133*  K 3.4* 3.3* 3.7  CL 97* 98 99  CO2 '23 25 25  '$ GLUCOSE  152* 218* 211*  BUN '16 16 14  '$ CREATININE 1.11 1.19 1.10  CALCIUM 8.0* 8.0* 8.0*  GFRNONAA >60 >60 >60   estimated creatinine clearance is 87.9 mL/min (by C-G formula based on SCr of 1.1 mg/dL).     Latest Ref Rng & Units 07/13/2022    5:01 AM 07/12/2022    6:09 AM 07/11/2022    6:31 AM  CMP  Glucose 70 - 99 mg/dL 211  218  152   BUN 6 - 20 mg/dL '14  16  16   '$ Creatinine 0.61 - 1.24 mg/dL 1.10  1.19  1.11   Sodium 135 - 145 mmol/L 133  133  132   Potassium 3.5 - 5.1 mmol/L 3.7  3.3  3.4   Chloride 98 - 111 mmol/L 99  98  97   CO2 22 - 32 mmol/L '25  25  23   '$ Calcium 8.9 - 10.3 mg/dL 8.0  8.0  8.0   Total Protein 6.5 - 8.1 g/dL 5.3  5.4  6.3   Total Bilirubin 0.3 - 1.2 mg/dL 0.3  0.2  0.4   Alkaline Phos 38 - 126 U/L 353  411  514   AST 15 - 41 U/L 23  23  33   ALT 0 - 44 U/L 17  22  36       Latest Ref Rng & Units 07/13/2022    5:01 AM 07/12/2022    6:09 AM 07/11/2022    6:31 AM  CBC  WBC 4.0 - 10.5 K/uL 8.3  10.3  13.0   Hemoglobin 13.0 - 17.0 g/dL 8.0  8.0  8.7   Hematocrit 39.0 - 52.0 % 25.0  24.4  26.4   Platelets 150 - 400 K/uL 720  616  582    Lipid Panel No results for input(s): "CHOL", "TRIG", "LDLCALC", "VLDL", "HDL", "CHOLHDL", "LDLDIRECT" in the last 8760 hours.  HEMOGLOBIN A1C Lab Results  Component Value Date   HGBA1C 7.7 (H) 03/21/2022   MPG 174.29 03/21/2022   TSH Recent Labs    07/08/22 2358  TSH 1.311   BNP (last 3 results) Recent Labs    07/07/22 2332  BNP 197.3*   Cardiac Panel (last 3 results) Recent Labs    07/14/22 1228  CKTOTAL 45*     Medications and allergies  No Known Allergies   Current Meds  Medication Sig   albuterol (VENTOLIN HFA) 108 (90 Base) MCG/ACT inhaler Inhale 2 puffs into the lungs every 6 (six) hours as needed for wheezing or shortness of breath.   ALPRAZolam (XANAX) 0.25 MG tablet Take 0.25 mg by mouth 3 (three) times daily as needed for anxiety.  amLODipine (NORVASC) 5 MG tablet Take 5 mg by mouth daily.    LYUMJEV TEMPO PEN 100 UNIT/ML SOPN Inject 0-12 Units into the skin as directed. Sliding Scale   oxyCODONE (ROXICODONE) 5 MG immediate release tablet Take 1 tablet (5 mg total) by mouth every 8 (eight) hours as needed. (Patient taking differently: Take 5 mg by mouth every 8 (eight) hours as needed for severe pain.)   ramipril (ALTACE) 10 MG capsule Take 10 mg by mouth daily.   rosuvastatin (CRESTOR) 5 MG tablet Take 5 mg by mouth 2 (two) times a week.   TRESIBA FLEXTOUCH 200 UNIT/ML SOPN Inject 40 Units into the skin every morning.   XTANDI 40 MG tablet Take 80 mg by mouth 2 (two) times daily with breakfast and lunch. Taking first dose after breakfast & second dose after lunch. Dr Alinda Money aware    Scheduled Meds:  ALPRAZolam  0.25 mg Oral QHS   amLODipine  5 mg Oral Daily   enzalutamide  80 mg Oral BID WC   insulin aspart  0-9 Units Subcutaneous Q4H   insulin glargine-yfgn  25 Units Subcutaneous Daily   potassium chloride  40 mEq Oral Once   ramipril  10 mg Oral Daily   rosuvastatin  5 mg Oral Once per day on Mon Thu   Continuous Infusions:  DAPTOmycin (CUBICIN) 750 mg in sodium chloride 0.9 % IVPB     PRN Meds:.acetaminophen **OR** acetaminophen, albuterol, ALPRAZolam, HYDROcodone-acetaminophen, oxyCODONE   I/O last 3 completed shifts: In: 2 [P.O.:420] Out: -  No intake/output data recorded.  Net IO Since Admission: 6,132.88 mL [07/14/22 1556]   Radiology:   Imaging results have been reviewed and [place] EKG SITE RITE  Result Date: 07/14/2022 If Site Rite image not attached, placement could not be confirmed due to current cardiac rhythm.   Cardiac Studies:   07/09/2022 EKG: normal sinus rhythm, rate 81 bpm. Normal axis, normal R wave progression. No ischemia.  07/15/2022 TEE: Under moderate sedation, TEE was performed without complications: LV: Normal. Normal EF. RV: Normal LA: Normal. Left atrial appendage: Normal without thrombus. Normal function. Inter atrial septum is intact  without defect.  RA: Normal  MV: Normal Mild MR. TV: Normal No TR AV: Normal. No AI or AS. PV: Normal. No PI. Valves are without vegetation. Thoracic and ascending aorta: Normal without significant plaque or atheromatous changes.    Assessment & Recommendations:   Enterococcus faecalis bacteremia TEE requested to rule out IE No vegetation on any of his valves LVEF is normal, mild MR is present Overall normal TEE   Anemia 2/2 gross hematuria s/p 3 units pRBCs Primary team managing   Please reach out to cardiology if you have further questions or concerns.    Floydene Flock, DO, [facility] 07/14/2022, 3:56 PM Office: [phone]

## 2022-07-14 NOTE — Progress Notes (Signed)
TRH night cross cover note:   I was notified by RN of patient's request to not check his blood sugar overnight so that he is able to get some sleep.  Per chart review, most recent CBG noted to be 158.  I conveyed to the patient's RN that is okay to refrain from checking CBGs overnight on this patient, with plan to resume in the morning.    Babs Bertin, DO Hospitalist

## 2022-07-14 NOTE — Care Plan (Signed)
TEE scheduled for 2 pm tomorrow 07/15/2022 in Endoscopy with me to rule out infective endocarditis.   Floydene Flock, [facility]

## 2022-07-14 NOTE — Progress Notes (Signed)
Patient ID: [name], male   DOB: 1966-02-26, 57 y.o.   MRN: [id number]   Pt voiding clear urine aside from one small clot last night.  No concern for ongoing active bleeding.  Will continue to monitor while hospitalized.

## 2022-07-14 NOTE — TOC Initial Note (Signed)
Transition of Care [facility]) - Initial/Assessment Note    Patient Details  Name: [name] MRN: [id number] Date of Birth: 1966-01-21  Transition of Care [facility]) CM/SW Contact:    Vassie Moselle, LCSW Phone Number: 07/14/2022, 10:54 AM  Clinical Narrative:                 TOC consulted for IV abx at DC. Met with pt who is agreeable to having HHRN arranged to assist. Pt shares that his wife will be able to assist with his IV abx at home as well. Pam with [employer] is following and will provide DME and education for pt/spouse prior to discharge. HHRN has been arranged w/ Alvis Lemmings. HH orders will need to be placed prior to discharge.   Expected Discharge Plan: [place] Barriers to Discharge: Continued Medical Work up   Patient Goals and CMS Choice Patient states their goals for this hospitalization and ongoing recovery are:: To go home   Choice offered to / list presented to : Patient      Expected Discharge Plan and Services In-house Referral: Clinical Social Work Discharge Planning Services: CM Consult Post Acute Care Choice: Durable Medical Equipment, Home Health Living arrangements for the past 2 months: Single Family Home                 DME Arranged: IV pump/equipment DME Agency: Other - Comment Engineer, manufacturing systems) Date DME Agency Contacted: 07/14/22 Time DME Agency Contacted: [id number] Representative spoke with at DME Agency: Carolynn Sayers HH Arranged: RN [place] Agency: Christian Date [place]: 07/14/22 Time [place]: 1054 Representative spoke with at [place]: [place] Arrangements/Services Living arrangements for the past 2 months: [place] with:: Spouse Patient language and need for interpreter reviewed:: Yes Do you feel safe going back to the place where you live?: Yes      Need for Family Participation in Patient Care: No (Comment) Care giver support system in place?: No (comment)   Criminal  Activity/Legal Involvement Pertinent to Current Situation/Hospitalization: No - Comment as needed  Activities of Daily Living Home Assistive Devices/Equipment: None ADL Screening (condition at time of admission) Patient's cognitive ability adequate to safely complete daily activities?: Yes Is the patient deaf or have difficulty hearing?: No Does the patient have difficulty seeing, even when wearing glasses/contacts?: No Does the patient have difficulty concentrating, remembering, or making decisions?: No Patient able to express need for assistance with ADLs?: Yes Does the patient have difficulty dressing or bathing?: No Independently performs ADLs?: Yes (appropriate for developmental age) Does the patient have difficulty walking or climbing stairs?: No Weakness of Legs: None Weakness of Arms/Hands: None  Permission Sought/Granted Permission sought to share information with : Case Manager, Family Supports Permission granted to share information with : Yes, Verbal Permission Granted  Share Information with NAME: Alejos Reinhardt  Permission granted to share info w AGENCY: [facility]  Permission granted to share info w Relationship: Spouse  Permission granted to share info w Contact Information: [phone]  Emotional Assessment Appearance:: Appears stated age Attitude/Demeanor/Rapport: Engaged Affect (typically observed): Accepting, Hopeful Orientation: : Oriented to Self, Oriented to Place, Oriented to  Time, Oriented to Situation Alcohol / Substance Use: Not Applicable Psych Involvement: No (comment)  Admission diagnosis:  Hypokalemia [E87.6] Hyponatremia [E87.1] Radiation cystitis [N30.40] AKI (acute kidney injury) ([place]) [N17.9] Symptomatic anemia [D64.9] Patient Active Problem List   Diagnosis Date Noted   Symptomatic anemia 07/08/2022  Hyponatremia 07/08/2022   Hypokalemia 07/08/2022   AKI (acute kidney injury) ([place]) 07/08/2022   Acute urinary retention 03/20/2022   Hematuria  03/20/2022   Malignant neoplasm of prostate metastatic to bone [facility]) 10/15/2021   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 ([place]) 10/15/2021   Hardening of the aorta (main artery of the heart) ([place]) 08/11/2021   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, associated with type 1 diabetes mellitus ([place]) 08/11/2021   Closed fracture of shaft of left fibula 03/03/2021   Left knee pain 02/14/2021   Pain of joint of left ankle and foot 02/14/2021   Obesity 05/19/2019   Encounter for general adult medical examination without abnormal findings 09/08/2018   Essential hypertension 12/29/2017   Prostate cancer ([place]) 09/02/2015   Family history of malignant neoplasm of gastrointestinal tract 03/09/2011   VIRAL URI 02/25/2010   Asthma 09/23/2009   CHEST DISCOMFORT 09/23/2009   SINUSITIS - ACUTE-NOS 09/21/2009   RHINITIS 09/21/2009   Hyperlipidemia 05/13/2009   Type 1 diabetes mellitus ([place]) 08/23/2008   WHEEZING 08/23/2008   PCP:  [name], MD Pharmacy:   [id number] [place]Phone: [phone]: [phone] [id number] [address]: [phone]: [phone]   Social Determinants of Health (SDOH) Social History: SDSmootNo Food Insecurity (07/08/2022)  Housing: Low Risk  (07/08/2022)  Transportation Needs: No Transportation Needs (07/08/2022)  Utilities: Not At Risk (07/08/2022)  Tobacco Use: Medium Risk (07/07/2022)   SDOH Interventions:     Readmission Risk Interventions    07/14/2022   10:52 AM 07/09/2022    8:58 AM  Readmission Risk Prevention Plan  Transportation Screening Complete Complete  Medication Review (RNBicknellComplete  Complete  PCP or Specialist appointment within 3-5 days of discharge Complete Complete  HRI or [place] Complete  SW Recovery Care/Counseling Consult Complete Complete  Palliative Care Screening Not Applicable Not [place] Applicable Not Applicable

## 2022-07-14 NOTE — Progress Notes (Signed)
PHARMACY - PHYSICIAN COMMUNICATION CRITICAL VALUE ALERT - BLOOD CULTURE IDENTIFICATION (BCID)  [name] is an 57 y.o. male who presented to [facility] on 07/07/2022 with a chief complaint of hematuria  Assessment:  Patient currently on antibiotics for MSSA bacteremia.  BCID now + Enterococcus faecalis (no resistance detected) in 1/4 bottles  Name of physician (or Provider) Contacted: Gershon Cull, FNP  Current antibiotics: Cefazolin  Changes to prescribed antibiotics recommended:  Continue Cefazolin for MSSA Begin Ampicillin per protocol for Enterococcus faecalis.  ID currently following patient and should receive an alert of new BCID via [employer].  Results for orders placed or performed during the hospital encounter of 07/07/22  Blood Culture ID Panel (Reflexed) (Collected: 07/10/2022  2:24 PM)  Result Value Ref Range   Enterococcus faecalis DETECTED (A) NOT DETECTED   Enterococcus Faecium NOT DETECTED NOT DETECTED   Listeria monocytogenes NOT DETECTED NOT DETECTED   Staphylococcus species NOT DETECTED NOT DETECTED   Staphylococcus aureus (BCID) NOT DETECTED NOT DETECTED   Staphylococcus epidermidis NOT DETECTED NOT DETECTED   Staphylococcus lugdunensis NOT DETECTED NOT DETECTED   Streptococcus species NOT DETECTED NOT DETECTED   Streptococcus agalactiae NOT DETECTED NOT DETECTED   Streptococcus pneumoniae NOT DETECTED NOT DETECTED   Streptococcus pyogenes NOT DETECTED NOT DETECTED   A.calcoaceticus-baumannii NOT DETECTED NOT DETECTED   Bacteroides fragilis NOT DETECTED NOT DETECTED   Enterobacterales NOT DETECTED NOT DETECTED   Enterobacter cloacae complex NOT DETECTED NOT DETECTED   Escherichia coli NOT DETECTED NOT DETECTED   Klebsiella aerogenes NOT DETECTED NOT DETECTED   Klebsiella oxytoca NOT DETECTED NOT DETECTED   Klebsiella pneumoniae NOT DETECTED NOT DETECTED   Proteus species NOT DETECTED NOT DETECTED   Salmonella species NOT DETECTED NOT DETECTED   Serratia  marcescens NOT DETECTED NOT DETECTED   Haemophilus influenzae NOT DETECTED NOT DETECTED   Neisseria meningitidis NOT DETECTED NOT DETECTED   Pseudomonas aeruginosa NOT DETECTED NOT DETECTED   Stenotrophomonas maltophilia NOT DETECTED NOT DETECTED   Candida albicans NOT DETECTED NOT DETECTED   Candida auris NOT DETECTED NOT DETECTED   Candida glabrata NOT DETECTED NOT DETECTED   Candida krusei NOT DETECTED NOT DETECTED   Candida parapsilosis NOT DETECTED NOT DETECTED   Candida tropicalis NOT DETECTED NOT DETECTED   Cryptococcus neoformans/gattii NOT DETECTED NOT DETECTED   Vancomycin resistance NOT DETECTED NOT DETECTED    Everette Rank, PharmD 07/14/2022  3:18 AM

## 2022-07-15 ENCOUNTER — Inpatient Hospital Stay (HOSPITAL_COMMUNITY): Payer: BC Managed Care – PPO | Admitting: Anesthesiology

## 2022-07-15 ENCOUNTER — Inpatient Hospital Stay (HOSPITAL_COMMUNITY): Payer: BC Managed Care – PPO

## 2022-07-15 ENCOUNTER — Encounter (HOSPITAL_COMMUNITY): Payer: Self-pay | Admitting: Internal Medicine

## 2022-07-15 ENCOUNTER — Encounter (HOSPITAL_COMMUNITY)
Admission: EM | Disposition: A | Discharge status: Home Health | Payer: Self-pay | Source: Home / Self Care | Attending: Internal Medicine

## 2022-07-15 DIAGNOSIS — D649 Anemia, unspecified: Secondary | ICD-10-CM | POA: Diagnosis not present

## 2022-07-15 HISTORY — PX: TEE WITHOUT CARDIOVERSION: SHX5443

## 2022-07-15 LAB — COMPREHENSIVE METABOLIC PANEL
ALT: 12 U/L (ref 0–44)
AST: 19 U/L (ref 15–41)
Albumin: 2.4 g/dL — ABNORMAL LOW (ref 3.5–5.0)
Alkaline Phosphatase: 298 U/L — ABNORMAL HIGH (ref 38–126)
Anion gap: 9 (ref 5–15)
BUN: 14 mg/dL (ref 6–20)
CO2: 26 mmol/L (ref 22–32)
Calcium: 8.7 mg/dL — ABNORMAL LOW (ref 8.9–10.3)
Chloride: 96 mmol/L — ABNORMAL LOW (ref 98–111)
Creatinine, Ser: 1.17 mg/dL (ref 0.61–1.24)
GFR, Estimated: >60 mL/min (ref >60)
Glucose, Bld: 364 mg/dL — ABNORMAL HIGH (ref 70–99)
Potassium: 4.8 mmol/L (ref 3.5–5.1)
Sodium: 131 mmol/L — ABNORMAL LOW (ref 135–145)
Total Bilirubin: 0.4 mg/dL (ref 0.3–1.2)
Total Protein: 6.5 g/dL (ref 6.5–8.1)

## 2022-07-15 LAB — GLUCOSE, CAPILLARY
Glucose-Capillary: 197 mg/dL — ABNORMAL HIGH (ref 70–99)
Glucose-Capillary: 281 mg/dL — ABNORMAL HIGH (ref 70–99)
Glucose-Capillary: 410 mg/dL — ABNORMAL HIGH (ref 70–99)
Glucose-Capillary: 400 mg/dL — ABNORMAL HIGH (ref 70–99)
Glucose-Capillary: 443 mg/dL — ABNORMAL HIGH (ref 70–99)
Glucose-Capillary: 282 mg/dL — ABNORMAL HIGH (ref 70–99)
Glucose-Capillary: 221 mg/dL — ABNORMAL HIGH (ref 70–99)
Glucose-Capillary: 244 mg/dL — ABNORMAL HIGH (ref 70–99)
Glucose-Capillary: 185 mg/dL — ABNORMAL HIGH (ref 70–99)
Glucose-Capillary: 172 mg/dL — ABNORMAL HIGH (ref 70–99)
Glucose-Capillary: 199 mg/dL — ABNORMAL HIGH (ref 70–99)

## 2022-07-15 LAB — CBC
HCT: 25.8 % — ABNORMAL LOW (ref 39.0–52.0)
Hemoglobin: 8.2 g/dL — ABNORMAL LOW (ref 13.0–17.0)
MCH: 28.8 pg (ref 26.0–34.0)
MCHC: 31.8 g/dL (ref 30.0–36.0)
MCV: 90.5 fL (ref 80.0–100.0)
Platelets: 1001 10*3/uL (ref 150–400)
RBC: 2.85 MIL/uL — ABNORMAL LOW (ref 4.22–5.81)
RDW: 14.6 % (ref 11.5–15.5)
WBC: 10.6 10*3/uL — ABNORMAL HIGH (ref 4.0–10.5)
nRBC: 0 % (ref 0.0–0.2)

## 2022-07-15 LAB — CK: Total CK: 33 U/L — ABNORMAL LOW (ref 49–397)

## 2022-07-15 LAB — CULTURE, BLOOD (ROUTINE X 2)
Culture: NO GROWTH
Special Requests: ADEQUATE

## 2022-07-15 SURGERY — ECHOCARDIOGRAM, TRANSESOPHAGEAL
Anesthesia: Monitor Anesthesia Care

## 2022-07-15 MED ORDER — INSULIN GLARGINE-YFGN 100 UNIT/ML ~~LOC~~ SOLN
20.0000 [IU] | Freq: Two times a day (BID) | SUBCUTANEOUS | Status: DC
  Filled 2022-07-15: qty 0.2

## 2022-07-15 MED ORDER — INSULIN GLARGINE-YFGN 100 UNIT/ML ~~LOC~~ SOLN
40.0000 [IU] | Freq: Every day | SUBCUTANEOUS | Status: DC
  Administered 2022-07-16: 40 [IU] via SUBCUTANEOUS
  Filled 2022-07-15 (×2): qty 0.4

## 2022-07-15 MED ORDER — SODIUM CHLORIDE 0.9 % IV SOLN
10.0000 mg/kg | Freq: Every day | INTRAVENOUS | Status: DC
  Administered 2022-07-15 – 2022-07-16 (×2): 800 mg via INTRAVENOUS
  Filled 2022-07-15 (×2): qty 16

## 2022-07-15 MED ORDER — PROPOFOL 500 MG/50ML IV EMUL
INTRAVENOUS | Status: DC | PRN
  Administered 2022-07-15: 125 ug/kg/min via INTRAVENOUS

## 2022-07-15 MED ORDER — PROPOFOL 10 MG/ML IV BOLUS
INTRAVENOUS | Status: DC | PRN
  Administered 2022-07-15: 40 mg via INTRAVENOUS
  Administered 2022-07-15: 60 mg via INTRAVENOUS
  Administered 2022-07-15: 100 mg via INTRAVENOUS

## 2022-07-15 MED ORDER — INSULIN GLARGINE-YFGN 100 UNIT/ML ~~LOC~~ SOLN
10.0000 [IU] | Freq: Every day | SUBCUTANEOUS | Status: DC
  Filled 2022-07-15: qty 0.1

## 2022-07-15 MED ORDER — INSULIN ASPART 100 UNIT/ML IJ SOLN
3.0000 [IU] | Freq: Three times a day (TID) | INTRAMUSCULAR | Status: DC
  Administered 2022-07-15 – 2022-07-16 (×3): 3 [IU] via SUBCUTANEOUS

## 2022-07-15 NOTE — Progress Notes (Signed)
  Echocardiogram Echocardiogram Transesophageal has been performed.  Eartha Inch 07/15/2022, 2:14 PM

## 2022-07-15 NOTE — Anesthesia Procedure Notes (Signed)
Procedure Name: MAC Date/Time: 07/15/2022 1:59 PM  Performed by: Darletta Moll, CRNAPre-anesthesia Checklist: Patient identified, Emergency Drugs available, Suction available and Patient being monitored Patient Re-evaluated:Patient Re-evaluated prior to induction Oxygen Delivery Method: Nasal cannula

## 2022-07-15 NOTE — Progress Notes (Signed)
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, symptomatic anemia, 6.3,  Subjective:  He is seen after return from TEE, no acute complaints, wondering when he can go home  Assessment & Plan:  Principal Problem:   Symptomatic anemia Active Problems:   Type 1 diabetes mellitus ([place])   Asthma   Prostate cancer ([place])   Hyperlipidemia   Essential hypertension   Hematuria   Hyponatremia   Hypokalemia   AKI (acute kidney injury) ([place])    Assessment and Plan:   Blood loss anemia/symptomatic anemia, presenting symptoms -Presented with gross hematuria with history of radiation cystitis/metastatic prostate cancer on ADT/Xtandi -Seen by urology Dr Alinda Money, hematuria clearing up, no indication for catheter or other intervention -Hemoglobin 6.3 on presentation, received PRBC x 3 unit, iron deficiency anemia, TSAT 5% - will defer iv iron for now due to active infection per ID recommendation    Thrombocytosis -Platelet 480 on presentation, increased to 1000 today, case discussed with hematology oncology Dr. Julien Nordmann states thrombocytosis likely reactive due to iron deficiency anemia, he recommended IV iron infusion, advised platelet showed start to come down in about a week after iron infusion, per oncology ,currently does not need to take aspirin, recommend hematology oncology follow-up if thrombocytosis persists after iron infusion -will give iv iron once cleared by ID  Polymicrobial bacteremia/polymicrobial UTI -Initial urine culture and Initial blood culture positive for MSSA and  Enterococcus faecalis. ID was consulted , was on cefazolin , started daptomycin on 07/14/2022 CPK 45 TEE on 1/31 Plan for PICC placement once repeat blood culture from  1/30 no growthx48hrs -Will follow ID recommendation  AKI Resolved  Hyponatremia/Hypokalemia/Hypomagnesemia Improved  Insulin-dependent type 2 diabetes, uncontrolled with hyperglycemia A1c 7.7 in 03/2022 Elevated blood glucose due to infection acute distress Adjust insulin while in the hospital Plan to discharge on home regimen once infection under control And follow-up with Dr. Forde Dandy  HTN, BP stable on current regimen HLD, continue statin, monitor CK while on daptomycin  Chronic bilateral lower extremity edema, report due to Xtandi, no change from baseline   Body mass index is 30.11 kg/m.Marland Kitchen  Meet criteria for obesity      I have Reviewed nursing notes, Vitals, pain scores, I/o's, Lab results and  imaging results since pt's last encounter, details please see discussion above  I ordered the following labs:  Unresulted Labs (From admission, onward)     Start     Ordered   07/16/22 0500  CBC with Differential/Platelet  Tomorrow morning,   R       Question:  Specimen collection method  Answer:  Lab=Lab collect   07/15/22 0738   07/16/22 3329  Basic metabolic panel  Daily,   R     Question:  Specimen collection method  Answer:  Lab=Lab collect   07/15/22 0738   07/16/22 0500  Magnesium  Tomorrow morning,   R       Question:  Specimen collection method  Answer:  Lab=Lab collect   07/15/22 0738   07/16/22 0500  Phosphorus  Tomorrow morning,   R       Question:  Specimen collection method  Answer:  Lab=Lab collect   07/15/22 0738   07/15/22 0500  CK  Weekly,   R  Question:  Specimen collection method  Answer:  Lab=Lab collect   07/14/22 1232             DVT prophylaxis: SCDs Start: 07/08/22 0856, no pharmacological DVT prophylaxis due to hematuria   Code Status:   Code Status: Full Code  Family Communication: Patient Disposition:    Dispo: The patient is from: Home              Anticipated d/c is to: Home              Anticipated d/c date is: Once cleared by  ID  Antimicrobials:    Anti-infectives (From admission, onward)    Start     Dose/Rate Route Frequency Ordered Stop   07/14/22 1600  DAPTOmycin (CUBICIN) 750 mg in sodium chloride 0.9 % IVPB        8 mg/kg  95.2 kg 130 mL/hr over 30 Minutes Intravenous Daily 07/14/22 1206     07/14/22 0400  ampicillin (OMNIPEN) 2 g in sodium chloride 0.9 % 100 mL IVPB  Status:  Discontinued        2 g 300 mL/hr over 20 Minutes Intravenous Every 4 hours 07/14/22 0314 07/14/22 1206   07/10/22 0800  ceFAZolin (ANCEF) IVPB 2g/100 mL premix  Status:  Discontinued        2 g 200 mL/hr over 30 Minutes Intravenous Every 8 hours 07/10/22 0712 07/14/22 1206   07/09/22 1000  cefTRIAXone (ROCEPHIN) 1 g in sodium chloride 0.9 % 100 mL IVPB  Status:  Discontinued        1 g 200 mL/hr over 30 Minutes Intravenous Every 24 hours 07/09/22 0915 07/10/22 0712          Objective: Vitals:   07/14/22 0416 07/14/22 1148 07/14/22 1927 07/15/22 0414  BP: (!) 156/78 (!) 140/63 (!) 154/71 (!) 159/72  Pulse: 86 86 88 84  Resp: '19 17 17 17  '$ Temp: 97.8 F (36.6 C) 98.4 F (36.9 C) 98.2 F (36.8 C) 99.6 F (37.6 C)  TempSrc: Oral Oral Oral Oral  SpO2: 100% 100% 97% 97%  Weight:      Height:        Intake/Output Summary (Last 24 hours) at 07/15/2022 0738 Last data filed at 07/14/2022 1800 Gross per 24 hour  Intake 3.51 ml  Output --  Net 3.51 ml   Filed Weights   07/07/22 1915  Weight: 95.2 kg    Examination:  General exam: alert, awake, communicative,calm, NAD Respiratory system: Clear to auscultation. Respiratory effort normal. Cardiovascular system:  RRR.  Gastrointestinal system: Abdomen is nondistended, soft and nontender.  Normal bowel sounds heard. Central nervous system: Alert and oriented. No focal neurological deficits. Extremities: Bilateral lower extremity edema, reports chronic, no change from baseline Skin: No rashes, lesions or ulcers Psychiatry: Judgement and insight appear normal. Mood  & affect appropriate.     Data Reviewed: I have personally reviewed  labs and visualized  imaging studies since the last encounter and formulate the plan        Scheduled Meds:  ALPRAZolam  0.25 mg Oral QHS   amLODipine  5 mg Oral Daily   enzalutamide  80 mg Oral BID WC   insulin aspart  0-9 Units Subcutaneous Q4H   insulin glargine-yfgn  25 Units Subcutaneous Daily   ramipril  10 mg Oral Daily   rosuvastatin  5 mg Oral Once per day on Mon Thu   Continuous Infusions:  sodium chloride     DAPTOmycin (CUBICIN)  750 mg in sodium chloride 0.9 % IVPB 750 mg (07/14/22 1642)     LOS: 7 days   Time spent: 25mns  FFlorencia Reasons MD PhD FACP [facility]  Available via Epic secure chat 7am-7pm for nonurgent issues Please page for urgent issues To page the attending provider between 7A-7P or the covering provider during after hours 7P-7A, please log into the web site [email or web address] and access using universal [place] password for that web site. If you do not have the password, please call the hospital operator.    07/15/2022, 7:38 AM

## 2022-07-15 NOTE — Inpatient Diabetes Management (Signed)
Inpatient Diabetes Program Recommendations  AACE/ADA: New Consensus Statement on Inpatient Glycemic Control (2015)  Target Ranges:  Prepandial:   less than 140 mg/dL      Peak postprandial:   less than 180 mg/dL (1-2 hours)      Critically ill patients:  140 - 180 mg/dL   Lab Results  Component Value Date   GLUCAP 244 (H) 07/15/2022   HGBA1C 7.7 (H) 03/21/2022    Review of Glycemic Control  Latest Reference Range & Units 07/15/22 08:30 07/15/22 08:47 07/15/22 10:05 07/15/22 13:05  Glucose-Capillary 70 - 99 mg/dL 443 (H) 400 (H) 282 (H) 244 (H)  (H): Data is abnormally high Diabetes history: Type 1 DM Outpatient Diabetes medications: Lyumjev 0-12 units TID, Tresiba 40 units QD Current orders for Inpatient glycemic control: Novolog 0-9 units q4H, semglee 20 units qd   Inpatient Diabetes Program Recommendations:     Consider further increasing Semglee to 30 units Qd.   Thanks, Bronson Curb, MSN, RNC-OB Diabetes Coordinator [phone] (8a-5p)

## 2022-07-15 NOTE — Transfer of Care (Signed)
Immediate Anesthesia Transfer of Care Note  Patient: [name]  Procedure(s) Performed: TRANSESOPHAGEAL ECHOCARDIOGRAM (TEE)  Patient Location: Endoscopy Unit  Anesthesia Type:MAC  Level of Consciousness: drowsy and patient cooperative  Airway & Oxygen Therapy: Patient Spontanous Breathing  Post-op Assessment: Report given to RN, Post -op Vital signs reviewed and stable, and Patient moving all extremities X 4  Post vital signs: Reviewed and stable  Last Vitals:  Vitals Value Taken Time  BP    Temp    Pulse 79 07/15/22 1413  Resp 19 07/15/22 1413  SpO2 96 % 07/15/22 1413  Vitals shown include unvalidated device data.  Last Pain:  Vitals:   07/15/22 1307  TempSrc: Temporal  PainSc: 0-No pain         Complications: No notable events documented.

## 2022-07-15 NOTE — Anesthesia Postprocedure Evaluation (Signed)
Anesthesia Post Note  Patient: [name]  Procedure(s) Performed: TRANSESOPHAGEAL ECHOCARDIOGRAM (TEE)     Patient location during evaluation: Endoscopy Anesthesia Type: MAC Level of consciousness: awake and alert Pain management: pain level controlled Vital Signs Assessment: post-procedure vital signs reviewed and stable Respiratory status: spontaneous breathing, nonlabored ventilation, respiratory function stable and patient connected to nasal cannula oxygen Cardiovascular status: blood pressure returned to baseline and stable Postop Assessment: no apparent nausea or vomiting Anesthetic complications: no  No notable events documented.  Last Vitals:  Vitals:   07/15/22 1307 07/15/22 1415  BP: (!) 156/76 132/60  Pulse: 85 80  Resp: 13 16  Temp: (!) 36.3 C   SpO2: 99% 93%    Last Pain:  Vitals:   07/15/22 1415  TempSrc:   PainSc: [place]

## 2022-07-16 ENCOUNTER — Encounter (HOSPITAL_COMMUNITY): Payer: Self-pay | Admitting: Internal Medicine

## 2022-07-16 ENCOUNTER — Inpatient Hospital Stay: Payer: Self-pay

## 2022-07-16 DIAGNOSIS — D649 Anemia, unspecified: Secondary | ICD-10-CM | POA: Diagnosis not present

## 2022-07-16 LAB — CBC WITH DIFFERENTIAL/PLATELET
Abs Immature Granulocytes: 0.06 10*3/uL (ref 0.00–0.07)
Basophils Absolute: 0 10*3/uL (ref 0.0–0.1)
Basophils Relative: 0 %
Eosinophils Absolute: 0.1 10*3/uL (ref 0.0–0.5)
Eosinophils Relative: 1 %
HCT: 25.8 % — ABNORMAL LOW (ref 39.0–52.0)
Hemoglobin: 8.2 g/dL — ABNORMAL LOW (ref 13.0–17.0)
Immature Granulocytes: 1 %
Lymphocytes Relative: 6 %
Lymphs Abs: 0.7 10*3/uL (ref 0.7–4.0)
MCH: 28.8 pg (ref 26.0–34.0)
MCHC: 31.8 g/dL (ref 30.0–36.0)
MCV: 90.5 fL (ref 80.0–100.0)
Monocytes Absolute: 1.3 10*3/uL — ABNORMAL HIGH (ref 0.1–1.0)
Monocytes Relative: 11 %
Neutro Abs: 9.5 10*3/uL — ABNORMAL HIGH (ref 1.7–7.7)
Neutrophils Relative %: 81 %
Platelets: 1147 10*3/uL (ref 150–400)
RBC: 2.85 MIL/uL — ABNORMAL LOW (ref 4.22–5.81)
RDW: 14.4 % (ref 11.5–15.5)
WBC: 11.7 10*3/uL — ABNORMAL HIGH (ref 4.0–10.5)
nRBC: 0 % (ref 0.0–0.2)

## 2022-07-16 LAB — BASIC METABOLIC PANEL
Anion gap: 12 (ref 5–15)
BUN: 11 mg/dL (ref 6–20)
CO2: 26 mmol/L (ref 22–32)
Calcium: 8.9 mg/dL (ref 8.9–10.3)
Chloride: 97 mmol/L — ABNORMAL LOW (ref 98–111)
Creatinine, Ser: 1.09 mg/dL (ref 0.61–1.24)
GFR, Estimated: >60 mL/min (ref >60)
Glucose, Bld: 144 mg/dL — ABNORMAL HIGH (ref 70–99)
Potassium: 4.4 mmol/L (ref 3.5–5.1)
Sodium: 135 mmol/L (ref 135–145)

## 2022-07-16 LAB — CULTURE, BLOOD (ROUTINE X 2)

## 2022-07-16 LAB — PHOSPHORUS: Phosphorus: 3.7 mg/dL (ref 2.5–4.6)

## 2022-07-16 LAB — GLUCOSE, CAPILLARY
Glucose-Capillary: 154 mg/dL — ABNORMAL HIGH (ref 70–99)
Glucose-Capillary: 132 mg/dL — ABNORMAL HIGH (ref 70–99)
Glucose-Capillary: 155 mg/dL — ABNORMAL HIGH (ref 70–99)
Glucose-Capillary: 204 mg/dL — ABNORMAL HIGH (ref 70–99)

## 2022-07-16 LAB — MAGNESIUM: Magnesium: 1.8 mg/dL (ref 1.7–2.4)

## 2022-07-16 MED ORDER — HEPARIN SOD (PORK) LOCK FLUSH 100 UNIT/ML IV SOLN
250.0000 [IU] | INTRAVENOUS | Status: AC | PRN
  Administered 2022-07-16: 250 [IU]

## 2022-07-16 MED ORDER — CHLORHEXIDINE GLUCONATE CLOTH 2 % EX PADS: 6.0000 | MEDICATED_PAD | Freq: Every day | CUTANEOUS | Status: DC

## 2022-07-16 MED ORDER — ROSUVASTATIN CALCIUM 5 MG PO TABS: 5.0000 mg | ORAL_TABLET | ORAL | Status: AC

## 2022-07-16 MED ORDER — SODIUM CHLORIDE 0.9% FLUSH: 10.0000 mL | INTRAVENOUS | Status: DC | PRN

## 2022-07-16 MED ORDER — SODIUM CHLORIDE 0.9% FLUSH: 10.0000 mL | Freq: Two times a day (BID) | INTRAVENOUS | Status: DC

## 2022-07-16 MED ORDER — DAPTOMYCIN IV (FOR PTA / DISCHARGE USE ONLY): 800.0000 mg | INTRAVENOUS | 0 refills | Status: AC

## 2022-07-16 NOTE — Progress Notes (Signed)
PHARMACY CONSULT NOTE FOR:  OUTPATIENT  PARENTERAL ANTIBIOTIC THERAPY (OPAT)  Indication: MSSA and enterococcal bacteremia Regimen: daptomycin '800mg'$  IV q24h End date: 07/27/2022  IV antibiotic discharge orders are pended. To discharging provider:  please sign these orders via discharge navigator,  Select New Orders & click on the button choice - Manage This Unsigned Work.     Thank you for allowing pharmacy to be a part of this patient's care.  Candie Mile 07/16/2022, 1:43 PM

## 2022-07-16 NOTE — Progress Notes (Signed)
Patient ID: [name], male   DOB: 1966-03-04, 57 y.o.   MRN: [id number]  Clair Gulling continues to have clear urine.  He has now been able to void some standing up and not be completely incontinent which is encouraging.  Hgb 8.2 remains stable.  He will keep his current f/u with me in mid February for his next routine prostate cancer visit.  Please call if additional questions.

## 2022-07-16 NOTE — Progress Notes (Addendum)
[facility]    Date of Admission:  07/07/2022   Total days of antibiotics 7          ID: [name] is a 56 y.o. male with  polymicrobial bacteremia from urinary source Principal Problem:   Symptomatic anemia Active Problems:   Type 1 diabetes mellitus ([facility])   Asthma   Prostate cancer ([place])   Hyperlipidemia   Essential hypertension   Hematuria   Hyponatremia   Hypokalemia   AKI (acute kidney injury) ([place])    Subjective: Afebrile. Had TEE yesterday no vegetation per patient  Medications:   ALPRAZolam  0.25 mg Oral QHS   amLODipine  5 mg Oral Daily   enzalutamide  80 mg Oral BID WC   insulin aspart  0-9 Units Subcutaneous Q4H   insulin aspart  3 Units Subcutaneous TID WC   insulin glargine-yfgn  40 Units Subcutaneous Daily   ramipril  10 mg Oral Daily   rosuvastatin  5 mg Oral Once per day on Mon Thu    Objective: Vital signs in last 24 hours: Temp:  [97.3 F (36.3 C)-98.5 F (36.9 C)] 98 F (36.7 C) (02/01 0836) Pulse Rate:  [79-92] 88 (02/01 0836) Resp:  [13-22] 18 (02/01 0836) BP: (132-161)/(60-80) 146/76 (02/01 0836) SpO2:  [92 %-99 %] 98 % (02/01 0836) Weight:  [93 kg] 93 kg (01/31 1307)  Physical Exam  Constitutional: He is oriented to person, place, and time. He appears well-developed and well-nourished. No distress.  HENT:  Mouth/Throat: Oropharynx is clear and moist. No oropharyngeal exudate.  Cardiovascular: Normal rate, regular rhythm and normal heart sounds. Exam reveals no gallop and no friction rub.  No murmur heard.  Pulmonary/Chest: Effort normal and breath sounds normal. No respiratory distress. He has no wheezes.  Abdominal: Soft. Bowel sounds are normal. He exhibits no distension. There is no tenderness.  Lymphadenopathy:  He has no cervical adenopathy.  Neurological: He is alert and oriented to person, place, and time.  Skin: Skin is warm and dry. No rash noted. No erythema.  Psychiatric: He has a normal  mood and affect. His behavior is normal.    Lab Results Recent Labs    07/15/22 0555 07/16/22 0529  WBC 10.6* 11.7*  HGB 8.2* 8.2*  HCT 25.8* 25.8*  NA 131* 135  K 4.8 4.4  CL 96* 97*  CO2 26 26  BUN 14 11  CREATININE 1.17 1.09   Liver Panel Recent Labs    07/15/22 0555  PROT 6.5  ALBUMIN 2.4*  AST 19  ALT 12  ALKPHOS 298*  BILITOT 0.4    Microbiology: 1/30 blood cx NGTD 1/26 blood cx - e.faecalis 1/25 blood cx - MSSA 1/25 urine cx - Staphylococcus aureus Enterococcus faecalis      MIC MIC    AMPICILLIN   <=2 SENSITIVE Sensitive    CIPROFLOXACIN <=0.5 SENSI... Sensitive      CLINDAMYCIN <=0.25 SENS... Sensitive      GENTAMICIN <=0.5 SENSI... Sensitive      Inducible Clindamycin NEGATIVE Sensitive      NITROFURANTOIN <=16 SENSIT... Sensitive <=16 SENSIT... Sensitive    OXACILLIN <=0.25 SENS... Sensitive      RIFAMPIN <=0.5 SENSI... Sensitive      TETRACYCLINE <=1 SENSITIVE Sensitive      TRIMETH/SULFA <=10 SENSIT... Sensitive      VANCOMYCIN <=0.5 SENSI... Sensitive 1 SENSITIVE Sensitive    Studies/Results: [place] EKG SITE RITE  Result Date: 07/16/2022 If Site [place]  not attached, placement could not be confirmed due to current cardiac rhythm.    Assessment/Plan: Polymicrobial bacteremia from urinary source = mssa and enterococcal bacteremia. Stabilized quickly since onset of starting abtx. TEE did not find vegetation.   Picc line today anticipate to go home tomorrow if no other testing needed  Would recommend IV iron infusion at conclusion of antibiotic.  Plan for 14 days using 1/30 as day 1, plan with daptomycin ------------------------ Diagnosis: Staph aureus and enterococcal bacteremia  Culture Result: above.  No Known Allergies  OPAT Orders Discharge antibiotics to be given via PICC line Discharge antibiotics: Per pharmacy protocol : daptomycin '10mg'$ /kg/day  Aim for Vancomycin trough 15-20 or AUC 400-550 (unless otherwise  indicated) Duration: 14 day  End Date: Feb 12th  [facility] Per Protocol:  Home health RN for IV administration and teaching; PICC line care and labs.    Labs weekly while on IV antibiotics: x__ CBC with differential _x_ BMP  _x_ CK  __x Please pull PIC at completion of IV antibiotics   Fax weekly labs to [phone]  Clinic Follow Up Appt: In 2-3 wk  @ [facility] Pager: [phone]  07/16/2022, 11:29 AM

## 2022-07-16 NOTE — Discharge Summary (Signed)
Discharge Summary  [name] URK:[id number] DOB: 10/23/1965  PCP: [name], MD  Admit date: 07/07/2022 Discharge date: 07/16/2022  30 Day Unplanned Readmission Risk Score    Flowsheet Row ED to Hosp-Admission (Current) from 07/07/2022 in [place] [facility]  30 Day Unplanned Readmission Risk Score (%) 32.49 Filed at 07/16/2022 1600       This score is the patient's risk of an unplanned readmission within 30 days of being discharged (0 -100%). The score is based on dignosis, age, lab data, medications, orders, and past utilization.   Low:  0-14.9   Medium: 15-21.9   High: 22-29.9   Extreme: 30 and above         Time spent: 36mns, more than 50% time spent on coordination of care.   Recommendations for Outpatient Follow-up:  F/u with PCP within a week  for hospital discharge follow up, repeat cbc/bmp at follow up F/u ID F/u with hematology for iron deficiency and thrombocytosis F/u with urology  Unresulted Labs (From admission, onward)     Start     Ordered   07/15/22 0500  CK  Weekly,   R     Question:  Specimen collection method  Answer:  Lab=Lab collect   07/14/22 1232            Discharge Diagnoses:  Active Hospital Problems   Diagnosis Date Noted   Symptomatic anemia 07/08/2022   Hyponatremia 07/08/2022   Hypokalemia 07/08/2022   AKI (acute kidney injury) ([facility] [facility] 07/08/2022   Hematuria 03/20/2022   Essential hypertension 12/29/2017   Prostate cancer ([facility] 09/02/2015   Asthma 09/23/2009   Hyperlipidemia 05/13/2009   Type 1 diabetes mellitus ([facility] 08/23/2008    Resolved Hospital Problems  No resolved problems to display.    Discharge Condition: stable  Diet recommendation: carb modified  Filed Weights   07/07/22 1915 07/15/22 1307  Weight: 95.2 kg 93 kg    History of present illness: ( per admitting MD Dr DRoel Cluck JMarval Regalis a 57y.o. male with medical history significant of Prostate Cancer  Anemia, DM  type 1,    Presented with  blood clot in urine Pt was sent to ER with Hg of 6.9 on Thursday has been fatigued lightheaded SOB  Post op for radiation cytitis Now pink urine but had blood clots before  No N/V no abd pain   Hospital Course:  Principal Problem:   Symptomatic anemia Active Problems:   Type 1 diabetes mellitus (HCC)   Asthma   [place]state cancer ([place]   Hyperlipidemia   Essential hypertension   Hematuria   Hyponatremia   Hypokalemia   AKI (acute kidney injury) ([facility]   Assessment and Plan:  Blood loss anemia/symptomatic anemia, presenting symptoms -Presented with gross hematuria with 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 MJulien Nordmannstates thrombocytosis likely reactive due to iron deficiency anemia, he recommended IV iron infusion, advised platelet showed start to come down in about a week after iron infusion, per oncology ,currently does not  need to take aspirin, -defer iv iron for now per ID recommendation,  f/u with hematology oncology    Polymicrobial bacteremia/polymicrobial UTI -Initial urine culture and Initial blood culture positive for MSSA and  Enterococcus faecalis. ID was consulted , was on cefazolin , started daptomycin on 07/14/2022 CPK 45 TEE on 1/31  repeat blood culture from 1/30 no growthx48hrs, picc line placement ordered on 2/1 -ID cleared him to go home once picc line placed , plan to finish iv daptomycin x14days from 1/30   AKI Resolved    Hyponatremia/Hypokalemia/Hypomagnesemia Improved   Insulin-dependent type 2 diabetes, uncontrolled with hyperglycemia A1c 7.7 in 03/2022 Elevated blood glucose due to infection and acute distress Adjust insulin while in the hospital Plan to discharge on home regimen once infection under control And follow-up with Dr. Forde Dandy   HTN, BP stable on current regimen HLD, hold statin, monitor CK while on daptomycin, resume statin once complete daptomycin treatment   Chronic bilateral lower extremity edema, report due to Xtandi, no change from baseline    Body mass index is 29.41 kg/m.[name]  Meet criteria for obesity        Procedures: TEE PICC placement  Consultations: Urology ID Cardiology for TEE Phone conversation with hematology Dr Julien Nordmann   Discharge Exam: BP 139/69 (BP Location: Right Arm)   Pulse 89   Temp 98.2 F (36.8 C) (Oral)   Resp 16   Ht '5\' 10"'$  (1.778 m)   Wt 93 kg   SpO2 100%   BMI 29.41 kg/m   General: NAD Cardiovascular: RRR Respiratory: Normal respiratory effort     Discharge Instructions     [facility] pharmacist to adjust dose for Vancomycin, Aminoglycosides and other anti-infective therapies as requested by physician.   Complete by: As directed    [facility] to provide Cath Flo '2mg'$    Complete by: As directed    Administer for PICC line occlusion and as ordered by physician for other access device issues.   Ambulatory referral to Hematology / Oncology   Complete by: As directed    For iron deficiency ( needs iv iron infusion) and thrombocytosis   Anaphylaxis Kit: Provided to treat any anaphylactic reaction to the medication being provided to the patient if First Dose or when requested by physician   Complete by: As directed    Epinephrine '1mg'$ /ml vial / amp: Administer 0.'3mg'$  (0.50m) subcutaneously once for moderate to severe anaphylaxis, nurse to call physician and pharmacy when reaction occurs and call 911 if needed for  immediate care   Diphenhydramine '50mg'$ /ml IV vial: Administer 25-'50mg'$  IV/IM PRN for first dose reaction, rash, itching, mild reaction, nurse to call physician and pharmacy when reaction occurs   Sodium Chloride 0.9% NS 5057mIV: Administer if needed for hypovolemic blood pressure drop or as ordered by physician after call to physician with anaphylactic reaction   Change dressing on IV access line weekly and PRN   Complete by: As directed    Diet Carb Modified   Complete by: As directed    Flush IV access with Sodium Chloride 0.9% and Heparin 10 units/ml or 100 units/ml   Complete by: As directed    Home infusion instructions - [facility]   Complete by: As directed    Instructions: Flush IV access with Sodium Chloride 0.9% and Heparin 10units/ml or 100units/ml   Change dressing on IV access line: Weekly and PRN   Instructions Cath Flo '2mg'$ : Administer for PICC Line occlusion and as ordered by  physician for other access device   [facility] pharmacist to adjust dose for: Vancomycin, Aminoglycosides and other anti-infective therapies as requested by physician   Increase activity slowly   Complete by: As directed    Method of administration may be changed at the discretion of home infusion pharmacist based upon assessment of the patient and/or caregiver's ability to self-administer the medication ordered   Complete by: As directed    No wound care   Complete by: As directed    Outpatient Parenteral Antibiotic Therapy Information Antibiotic: Daptomycin (Cubicin) IVPB; Indications for use: MSSA and enterococcal bacteremia; End Date: 07/27/2022   Complete by: As directed    Antibiotic: Daptomycin (Cubicin) IVPB   Indications for use: MSSA and enterococcal bacteremia   End Date: 07/27/2022      Allergies as of 07/16/2022   No Known Allergies      Medication List     TAKE these medications    albuterol 108 (90 Base) MCG/ACT inhaler Commonly known as: VENTOLIN  HFA Inhale 2 puffs into the lungs every 6 (six) hours as needed for wheezing or shortness of breath.   ALPRAZolam 0.25 MG tablet Commonly known as: XANAX Take 0.25 mg by mouth 3 (three) times daily as needed for anxiety.   amLODipine 5 MG tablet Commonly known as: NORVASC Take 5 mg by mouth daily.   B-D INS SYRINGE 0.5CC/31GX5/16 31G X 5/16" 0.5 ML Misc Generic drug: Insulin Syringe-Needle U-100 use 1 syringe as directed (does 5 injections a day   B-D UF III MINI PEN NEEDLES 31G X 5 MM Misc Generic drug: Insulin Pen Needle USE TO INJECT INSULIN 4 TIMES DAILY E10.9   daptomycin  IVPB Commonly known as: CUBICIN Inject 800 mg into the vein daily for 11 days. Indication:  MSSA and enterococcal bacteremia First Dose: No Last Day of Therapy:  07/27/22 Labs - Once weekly:  CBC/D, BMP, and CPK Labs - Every other week:  ESR and CRP Method of administration: IV Push Method of administration may be changed at the discretion of home infusion pharmacist based upon assessment of the patient and/or caregiver's ability to self-administer the medication ordered.   FreeStyle Libre 2 Sensor Misc USE TO MONITOR CBG EVERY 14 DAYS AS DIRECTED   Lyumjev Tempo Pen 100 UNIT/ML Sopn Generic drug: Insulin Lispro-aabc w/TranPort Inject 0-12 Units into the skin as directed. Sliding Scale   oxyCODONE 5 MG immediate release tablet Commonly known as: Roxicodone Take 1 tablet (5 mg total) by mouth every 8 (eight) hours as needed. What changed: reasons to take this   ramipril 10 MG capsule Commonly known as: ALTACE Take 10 mg by mouth daily.   rosuvastatin 5 MG tablet Commonly known as: CRESTOR Take 1 tablet (5 mg total) by mouth 2 (two) times a week. Please hold  rosuvastatin for now, resume after  IV antibiotic treatment completed What changed: additional instructions   Tresiba FlexTouch 200 UNIT/ML FlexTouch Pen Generic drug: insulin degludec Inject 40 Units into the skin every morning.    Xtandi 40 MG tablet Generic drug: enzalutamide Take 80 mg by mouth 2 (two) times daily with breakfast and lunch. Taking first dose after breakfast & second dose after lunch. Dr Alinda Money aware               Discharge Care Instructions  (From admission, onward)           Start     Ordered   07/16/22 0000  Change dressing on IV access  line weekly and PRN  (Home infusion instructions - [facility] )        07/16/22 1406           No Known Allergies  Follow-up Information     [name], MD Follow up in 1 week(s).   Specialty: Endocrinology Why: Hospital discharge follow up, repeat basic labs including cbc/bmp at follow up pcp and hematology to monitor platelet count and  iron level Contact information: [place] [address] [phone]         Curt Bears, MD Follow up.   Specialty: Oncology Why: for iron deficiency and platelet count Contact information: [place] [phone]         Raynelle Bring, MD Follow up.   Specialty: Urology Contact information: [place] [address] [phone]         [facility]              Follow up.   Why: 2-3 weeks Contact information: [address] [place] [phone]                 The results of significant diagnostics from this hospitalization (including imaging, microbiology, ancillary and laboratory) are listed below for reference.    Significant Diagnostic Studies: [place] EKG SITE RITE  Result Date: 07/16/2022 If Site Rite image not attached, placement could not be confirmed due to current cardiac rhythm.  [place] EKG SITE RITE  Result Date: 07/14/2022 If Site Rite image not attached, placement could not be confirmed due to current cardiac rhythm.  ECHOCARDIOGRAM COMPLETE  Result Date: 07/11/2022    ECHOCARDIOGRAM REPORT   Patient Name:   [name]  Date of Exam: 07/11/2022 Medical Rec #:  [id number]     Height:       70.0 in Accession #:    [id number]    Weight:       209.9 lb Date of Birth:  [birth year]     BSA:          2.131 m Patient Age:    57 years      BP:           176/70 mmHg Patient Gender: M             HR:           87 bpm. Exam Location:  Inpatient Procedure: 2D Echo, Cardiac Doppler and Color Doppler Indications:    Bacteremia R78.81  History:        Patient has no prior history of Echocardiogram examinations.                 Risk Factors:Diabetes and Hypertension.  Sonographer:    Bernadene Person RDCS Referring Phys: [id number] [place]  1. Left ventricular ejection fraction, by estimation, is 60 to 65%. The left ventricle has normal function. The left ventricle has no regional wall motion abnormalities. Left ventricular diastolic parameters were normal.  2. Right ventricular systolic function is normal. The right ventricular size is normal. Tricuspid regurgitation signal is inadequate for assessing PA pressure.  3. Right atrial size was mildly dilated.  4. The mitral valve is normal in structure. Trivial mitral valve regurgitation. No evidence of mitral stenosis.  5. The aortic valve is tricuspid. Aortic valve regurgitation is not visualized. No aortic stenosis is present.  6. The inferior vena cava is normal in size with  greater than 50% respiratory variability, suggesting right atrial pressure of 3 mmHg. Comparison(s): No prior Echocardiogram. FINDINGS  Left Ventricle: Left ventricular ejection fraction, by estimation, is 60 to 65%. The left ventricle has normal function. The left ventricle has no regional wall motion abnormalities. The left ventricular internal cavity size was normal in size. There is  no left ventricular hypertrophy. Left ventricular diastolic parameters were normal. Right Ventricle: The right ventricular size is normal. Right ventricular systolic function is normal. Tricuspid regurgitation signal is  inadequate for assessing PA pressure. The tricuspid regurgitant velocity is 2.47 m/s, and with an assumed right atrial  pressure of 3 mmHg, the estimated right ventricular systolic pressure is 51.7 mmHg. Left Atrium: Left atrial size was normal in size. Right Atrium: Right atrial size was mildly dilated. Pericardium: There is no evidence of pericardial effusion. Mitral Valve: The mitral valve is normal in structure. Trivial mitral valve regurgitation. No evidence of mitral valve stenosis. Tricuspid Valve: The tricuspid valve is normal in structure. Tricuspid valve regurgitation is trivial. No evidence of tricuspid stenosis. Aortic Valve: The aortic valve is tricuspid. Aortic valve regurgitation is not visualized. No aortic stenosis is present. Pulmonic Valve: The pulmonic valve was normal in structure. Pulmonic valve regurgitation is trivial. No evidence of pulmonic stenosis. Aorta: The aortic root is normal in size and structure. Venous: The inferior vena cava is normal in size with greater than 50% respiratory variability, suggesting right atrial pressure of 3 mmHg. IAS/Shunts: No atrial level shunt detected by color flow Doppler.  LEFT VENTRICLE PLAX 2D LVIDd:         5.00 cm      Diastology LVIDs:         3.40 cm      LV e' medial:    8.27 cm/s LV PW:         0.90 cm      LV E/e' medial:  13.3 LV IVS:        0.90 cm      LV e' lateral:   9.90 cm/s LVOT diam:     2.00 cm      LV E/e' lateral: 11.1 LV SV:         67 LV SV Index:   31 LVOT Area:     3.14 cm  LV Volumes (MOD) LV vol d, MOD A2C: 128.0 ml LV vol d, MOD A4C: 111.0 ml LV vol s, MOD A2C: 52.0 ml LV vol s, MOD A4C: 44.7 ml LV SV MOD A2C:     76.0 ml LV SV MOD A4C:     111.0 ml LV SV MOD BP:      72.3 ml RIGHT VENTRICLE RV S prime:     13.90 cm/s TAPSE (M-mode): 2.4 cm LEFT ATRIUM             Index        RIGHT ATRIUM           Index LA diam:        4.00 cm 1.88 cm/m   RA Area:     21.30 cm LA Vol (A2C):   48.3 ml 22.67 ml/m  RA Volume:   73.70 ml   34.59 ml/m LA Vol (A4C):   37.6 ml 17.65 ml/m LA Biplane Vol: 44.4 ml 20.84 ml/m  AORTIC VALVE LVOT Vmax:   113.00 cm/s LVOT Vmean:  75.800 cm/s LVOT VTI:    0.212 m  AORTA Ao Root diam: 3.40 cm Ao Asc diam:  3.00 cm  MITRAL VALVE                TRICUSPID VALVE MV Area (PHT): 4.06 cm     TR Peak grad:   24.4 mmHg MV Decel Time: 187 msec     TR Vmax:        247.00 cm/s MV E velocity: 110.00 cm/s MV A velocity: 74.90 cm/s   SHUNTS MV E/A ratio:  1.47         Systemic VTI:  0.21 m                             Systemic Diam: 2.00 cm Kirk Ruths MD Electronically signed by Kirk Ruths MD Signature Date/Time: 07/11/2022/3:00:12 PM    Final    US Renal  Result Date: 07/07/2022 CLINICAL DATA:  Acute renal insufficiency. EXAM: RENAL / URINARY TRACT ULTRASOUND COMPLETE COMPARISON:  CT abdomen pelvis dated 03/21/2022. FINDINGS: Right Kidney: Renal measurements: 11.4 x 6.1 x 6.0 cm = volume: 216 mL. Normal echogenicity. No hydronephrosis or shadowing stone. Left Kidney: Renal measurements: 12.7 x 6.3 x 6.9 cm = volume: 290 mL. Normal echogenicity. No hydronephrosis or shadowing stone. Bladder: There is slight irregularity of the bladder wall which may represent trabeculation related to chronic bladder outlet obstruction. Focal area of nodular protrusion from the right lateral bladder wall, likely related to underdistention. A bladder lesion is not excluded. Clinical correlation and further evaluation with cystoscopy, if clinically indicated, recommended. Other: The prostate gland is enlarged measuring 6.0 x 3.7 x 3.7 cm. There is median lobe hypertrophy indenting the base of the bladder. IMPRESSION: 1. Unremarkable kidneys. 2. Focal protrusion of the right bladder wall may be related to underdistention. A bladder lesion is not entirely excluded clinical correlation is recommended. 3. Enlarged prostate gland. Electronically Signed   By: Anner Crete M.D.   On: 07/07/2022 22:37    Microbiology: Recent Results  (from the past 240 hour(s))  Culture, blood (Routine X 2) w Reflex to ID Panel     Status: Abnormal   Collection Time: 07/09/22 10:23 AM   Specimen: BLOOD LEFT ARM  Result Value Ref Range Status   Specimen Description   Final    BLOOD LEFT ARM Performed at [facility], [address], [place], [place] [address]    Special Requests   Final    BOTTLES DRAWN AEROBIC AND ANAEROBIC Blood Culture adequate volume Performed at [place] [address], [place], [place] [address]    Culture  Setup Time   Final    [facility] POSITIVE COCCI AEROBIC BOTTLE ONLY PHARMD M. Moreen Fowler 07/10/22 @ 0610 BY AB Performed at [facility], [place] [address], [place],  [address]    Culture STAPHYLOCOCCUS AUREUS (A)  Final   Report Status 07/12/2022 FINAL  Final   Organism ID, Bacteria STAPHYLOCOCCUS AUREUS  Final      Susceptibility   Staphylococcus aureus - MIC*    CIPROFLOXACIN <=0.5 SENSITIVE Sensitive     ERYTHROMYCIN <=0.25 SENSITIVE Sensitive     GENTAMICIN <=0.5 SENSITIVE Sensitive     OXACILLIN 0.5 SENSITIVE Sensitive     TETRACYCLINE <=1 SENSITIVE Sensitive     VANCOMYCIN <=0.5 SENSITIVE Sensitive     TRIMETH/SULFA <=10 SENSITIVE Sensitive     CLINDAMYCIN <=0.25 SENSITIVE Sensitive     RIFAMPIN <=0.5 SENSITIVE Sensitive     Inducible Clindamycin NEGATIVE Sensitive     * STAPHYLOCOCCUS AUREUS  Culture, blood (Routine X 2) w Reflex to ID Panel     Status: Abnormal   Collection Time: 07/09/22 10:23 AM   Specimen: BLOOD  Result Value Ref Range Status   Specimen Description   Final    BLOOD BLOOD RIGHT HAND Performed at [facility] [address] [place], [place] [address]    Special Requests   Final    BOTTLES DRAWN AEROBIC ONLY Blood Culture adequate volume Performed at [facility] [address] [place], [place] [address]    Culture  Setup Time   Final    GRAM POSITIVE COCCI IN CLUSTERS AEROBIC BOTTLE  ONLY CRITICAL VALUE NOTED.  VALUE IS CONSISTENT WITH PREVIOUSLY REPORTED AND CALLED VALUE.    Culture (A)  Final    STAPHYLOCOCCUS AUREUS SUSCEPTIBILITIES PERFORMED ON PREVIOUS CULTURE WITHIN THE LAST 5 DAYS. Performed at [facility], [place] [address], [place], [place] [address]    Report Status 07/12/2022 FINAL  Final  Blood Culture ID Panel (Reflexed)     Status: Abnormal   Collection Time: 07/09/22 10:23 AM  Result Value Ref Range Status   Enterococcus faecalis NOT DETECTED NOT DETECTED Final   Enterococcus Faecium NOT DETECTED NOT DETECTED Final   Listeria monocytogenes NOT DETECTED NOT DETECTED Final   Staphylococcus species DETECTED (A) NOT DETECTED Final    Comment: CRITICAL RESULT CALLED TO, READ BACK BY AND VERIFIED WITH: PHARMD M. SWAYNE 07/10/22 @ 0610 BY AB    Staphylococcus aureus (BCID) DETECTED (A) NOT DETECTED Final    Comment: CRITICAL RESULT CALLED TO, READ BACK BY AND VERIFIED WITH: PHARMD M. SWAYNE 07/10/22 @ 0610 BY AB    Staphylococcus epidermidis NOT DETECTED NOT DETECTED Final   Staphylococcus lugdunensis NOT DETECTED NOT DETECTED Final   Streptococcus species NOT DETECTED NOT DETECTED Final   Streptococcus agalactiae NOT DETECTED NOT DETECTED Final   Streptococcus pneumoniae NOT DETECTED NOT DETECTED Final   Streptococcus pyogenes NOT DETECTED NOT DETECTED Final   A.calcoaceticus-baumannii NOT DETECTED NOT DETECTED Final   Bacteroides fragilis NOT DETECTED NOT DETECTED Final   Enterobacterales NOT DETECTED NOT DETECTED Final   Enterobacter cloacae complex NOT DETECTED NOT DETECTED Final   Escherichia coli NOT DETECTED NOT DETECTED Final   Klebsiella aerogenes NOT DETECTED NOT DETECTED Final   Klebsiella oxytoca NOT DETECTED NOT DETECTED Final   Klebsiella pneumoniae NOT DETECTED NOT DETECTED Final   Proteus species NOT DETECTED NOT DETECTED Final   Salmonella species NOT DETECTED NOT DETECTED Final   Serratia marcescens NOT DETECTED NOT DETECTED  Final   Haemophilus influenzae NOT DETECTED NOT DETECTED Final   Neisseria meningitidis NOT DETECTED NOT DETECTED Final   Pseudomonas aeruginosa NOT DETECTED NOT DETECTED Final   Stenotrophomonas maltophilia NOT DETECTED NOT DETECTED Final   Candida albicans NOT DETECTED NOT DETECTED Final   Candida auris NOT DETECTED NOT DETECTED Final   Candida glabrata NOT DETECTED NOT DETECTED Final   Candida krusei NOT DETECTED NOT DETECTED Final   Candida parapsilosis NOT DETECTED NOT DETECTED Final   Candida tropicalis NOT DETECTED NOT DETECTED Final   Cryptococcus neoformans/gattii NOT DETECTED NOT DETECTED Final   Meth resistant mecA/C and MREJ NOT DETECTED NOT DETECTED Final    Comment: Performed at [facility], [address] [place], [place] [address]  Urine Culture (for pregnant, neutropenic or urologic patients or patients with an indwelling urinary catheter)     Status: Abnormal   Collection Time: 07/09/22 11:20 AM  Specimen: Urine, Clean Catch  Result Value Ref Range Status   Specimen Description   Final    URINE, CLEAN CATCH Performed at [facility], [place] [address], [place], [place] [address]    Special Requests   Final    NONE Performed at [facility], [place] [address] [place], [place] [address]    Culture (A)  Final    >=100,000 COLONIES/mL STAPHYLOCOCCUS AUREUS >=100,000 COLONIES/mL ENTEROCOCCUS FAECALIS    Report Status 07/13/2022 FINAL  Final   Organism ID, Bacteria STAPHYLOCOCCUS AUREUS (A)  Final   Organism ID, Bacteria ENTEROCOCCUS FAECALIS (A)  Final      Susceptibility   Enterococcus faecalis - MIC*    AMPICILLIN <=2 SENSITIVE Sensitive     NITROFURANTOIN <=16 SENSITIVE Sensitive     VANCOMYCIN 1 SENSITIVE Sensitive     * >=100,000 COLONIES/mL ENTEROCOCCUS FAECALIS   Staphylococcus aureus - MIC*    CIPROFLOXACIN <=0.5 SENSITIVE Sensitive     GENTAMICIN <=0.5 SENSITIVE Sensitive     NITROFURANTOIN <=16 SENSITIVE  Sensitive     OXACILLIN <=0.25 SENSITIVE Sensitive     TETRACYCLINE <=1 SENSITIVE Sensitive     VANCOMYCIN <=0.5 SENSITIVE Sensitive     TRIMETH/SULFA <=10 SENSITIVE Sensitive     CLINDAMYCIN <=0.25 SENSITIVE Sensitive     RIFAMPIN <=0.5 SENSITIVE Sensitive     Inducible Clindamycin NEGATIVE Sensitive     * >=100,000 COLONIES/mL STAPHYLOCOCCUS AUREUS  Gastrointestinal Panel by PCR , Stool     Status: None   Collection Time: 07/10/22  9:22 AM   Specimen: STOOL  Result Value Ref Range Status   Campylobacter species NOT DETECTED NOT DETECTED Final   Plesimonas shigelloides NOT DETECTED NOT DETECTED Final   Salmonella species NOT DETECTED NOT DETECTED Final   Yersinia enterocolitica NOT DETECTED NOT DETECTED Final   Vibrio species NOT DETECTED NOT DETECTED Final   Vibrio cholerae NOT DETECTED NOT DETECTED Final   Enteroaggregative E coli (EAEC) NOT DETECTED NOT DETECTED Final   Enteropathogenic E coli (EPEC) NOT DETECTED NOT DETECTED Final   Enterotoxigenic E coli (ETEC) NOT DETECTED NOT DETECTED Final   Shiga like toxin producing E coli (STEC) NOT DETECTED NOT DETECTED Final   Shigella/Enteroinvasive E coli (EIEC) NOT DETECTED NOT DETECTED Final   Cryptosporidium NOT DETECTED NOT DETECTED Final   Cyclospora cayetanensis NOT DETECTED NOT DETECTED Final   Entamoeba histolytica NOT DETECTED NOT DETECTED Final   Giardia lamblia NOT DETECTED NOT DETECTED Final   Adenovirus F40/41 NOT DETECTED NOT DETECTED Final   Astrovirus NOT DETECTED NOT DETECTED Final   Norovirus GI/GII NOT DETECTED NOT DETECTED Final   Rotavirus A NOT DETECTED NOT DETECTED Final   Sapovirus (I, II, IV, and V) NOT DETECTED NOT DETECTED Final    Comment: Performed at [facility], [address] [place], [place] [address]  Culture, blood (Routine X 2) w Reflex to ID Panel     Status: None   Collection Time: 07/10/22  2:21 PM   Specimen: BLOOD  Result Value Ref Range Status   Specimen Description   Final     BLOOD BLOOD LEFT ARM AEROBIC BOTTLE ONLY ANAEROBIC BOTTLE ONLY Performed at [facility], [place] [address] [name], [place] [address]    Special Requests   Final    BOTTLES DRAWN AEROBIC AND ANAEROBIC Blood Culture adequate volume Performed at [place] [address], [place], [place] [address]    Culture   Final  NO GROWTH 5 DAYS Performed at [facility], [place] [address], [place], [place] [address]    Report Status 07/15/2022 FINAL  Final  Culture, blood (Routine X 2) w Reflex to ID Panel     Status: Abnormal   Collection Time: 07/10/22  2:24 PM   Specimen: BLOOD  Result Value Ref Range Status   Specimen Description   Final    BLOOD BLOOD LEFT HAND AEROBIC BOTTLE ONLY ANAEROBIC BOTTLE ONLY Performed at [facility] [address], [place], [place] [address]    Special Requests   Final    BOTTLES DRAWN AEROBIC AND ANAEROBIC Blood Culture results may not be optimal due to an inadequate volume of blood received in culture bottles Performed at [facility] [address] [place], [place] [address]    Culture  Setup Time   Final    GRAM POSITIVE COCCI IN PAIRS IN CHAINS ANAEROBIC BOTTLE ONLY CRITICAL RESULT CALLED TO, READ BACK BY AND VERIFIED WITH: PHARMD L. POINTDEXTER 07/14/22 @ 0255 BY AB Performed at [facility], [place] [address], [place],  [address]    Culture ENTEROCOCCUS FAECALIS (A)  Final   Report Status 07/16/2022 FINAL  Final   Organism ID, Bacteria ENTEROCOCCUS FAECALIS  Final      Susceptibility   Enterococcus faecalis - MIC*    AMPICILLIN <=2 SENSITIVE Sensitive     VANCOMYCIN 1 SENSITIVE Sensitive     GENTAMICIN SYNERGY SENSITIVE Sensitive     * ENTEROCOCCUS FAECALIS  Blood Culture ID Panel (Reflexed)     Status: Abnormal   Collection Time: 07/10/22  2:24 PM  Result Value Ref Range Status   Enterococcus faecalis DETECTED (A) NOT DETECTED Final    Comment:  CRITICAL RESULT CALLED TO, READ BACK BY AND VERIFIED WITH: PHARMD L. POINTDEXTER 07/14/22 @ 0255 BY AB    Enterococcus Faecium NOT DETECTED NOT DETECTED Final   Listeria monocytogenes NOT DETECTED NOT DETECTED Final   Staphylococcus species NOT DETECTED NOT DETECTED Final   Staphylococcus aureus (BCID) NOT DETECTED NOT DETECTED Final   Staphylococcus epidermidis NOT DETECTED NOT DETECTED Final   Staphylococcus lugdunensis NOT DETECTED NOT DETECTED Final   Streptococcus species NOT DETECTED NOT DETECTED Final   Streptococcus agalactiae NOT DETECTED NOT DETECTED Final   Streptococcus pneumoniae NOT DETECTED NOT DETECTED Final   Streptococcus pyogenes NOT DETECTED NOT DETECTED Final   A.calcoaceticus-baumannii NOT DETECTED NOT DETECTED Final   Bacteroides fragilis NOT DETECTED NOT DETECTED Final   Enterobacterales NOT DETECTED NOT DETECTED Final   Enterobacter cloacae complex NOT DETECTED NOT DETECTED Final   Escherichia coli NOT DETECTED NOT DETECTED Final   Klebsiella aerogenes NOT DETECTED NOT DETECTED Final   Klebsiella oxytoca NOT DETECTED NOT DETECTED Final   Klebsiella pneumoniae NOT DETECTED NOT DETECTED Final   Proteus species NOT DETECTED NOT DETECTED Final   Salmonella species NOT DETECTED NOT DETECTED Final   Serratia marcescens NOT DETECTED NOT DETECTED Final   Haemophilus influenzae NOT DETECTED NOT DETECTED Final   Neisseria meningitidis NOT DETECTED NOT DETECTED Final   Pseudomonas aeruginosa NOT DETECTED NOT DETECTED Final   Stenotrophomonas maltophilia NOT DETECTED NOT DETECTED Final   Candida albicans NOT DETECTED NOT DETECTED Final   Candida auris NOT DETECTED NOT DETECTED Final   Candida glabrata NOT DETECTED NOT DETECTED Final   Candida krusei NOT DETECTED NOT DETECTED Final   Candida parapsilosis NOT DETECTED NOT DETECTED Final   Candida tropicalis NOT DETECTED NOT DETECTED  Final   Cryptococcus neoformans/gattii NOT DETECTED NOT DETECTED Final   Vancomycin  resistance NOT DETECTED NOT DETECTED Final    Comment: Performed at [facility], [place] [address] [place], [place] [address]  Culture, blood (Routine X 2) w Reflex to ID Panel     Status: None (Preliminary result)   Collection Time: 07/14/22 12:28 PM   Specimen: BLOOD LEFT ARM  Result Value Ref Range Status   Specimen Description   Final    BLOOD LEFT ARM Performed at [facility] [address] [place], [place] [address]    Special Requests   Final    BOTTLES DRAWN AEROBIC ONLY Blood Culture adequate volume Performed at [facility] [address], [place], [place] [address]    Culture   Final    NO GROWTH 2 DAYS Performed at [facility] [address] [place], [place] [address]    Report Status PENDING  Incomplete  Culture, blood (Routine X 2) w Reflex to ID Panel     Status: None (Preliminary result)   Collection Time: 07/14/22 12:28 PM   Specimen: BLOOD LEFT HAND  Result Value Ref Range Status   Specimen Description   Final    BLOOD LEFT HAND Performed at [facility] [address], [place], [place] [address]    Special Requests   Final    BOTTLES DRAWN AEROBIC ONLY Blood Culture adequate volume Performed at [facility] [address] [place], [place] [address]    Culture   Final    NO GROWTH 2 DAYS Performed at [facility] [address] [place], [place] [address]    Report Status PENDING  Incomplete     Labs: Basic Metabolic Panel: Recent Labs  Lab 07/10/22 0525 07/11/22 0631 07/12/22 0609 07/13/22 0501 07/15/22 0555 07/16/22 0529  NA 134* 132* 133* 133* 131* 135  K 3.3* 3.4* 3.3* 3.7 4.8 4.4  CL 99 97* 98 99 96* 97*  CO2 '23 23 25 25 26 26  '$ GLUCOSE 136* 152* 218* 211* 364* 144*  BUN '18 16 16 14 14 11  '$ CREATININE 1.07 1.11 1.19 1.10 1.17 1.09  CALCIUM 8.1* 8.0* 8.0* 8.0* 8.7* 8.9  MG 1.7 1.8 1.8 1.8  --  1.8  PHOS  --   --   --   --   --  3.7    Liver Function Tests: Recent Labs  Lab 07/10/22 0525 07/11/22 0631 07/12/22 0609 07/13/22 0501 07/15/22 0555  AST 32 33 '23 23 19  '$ ALT 33 36 '22 17 12  '$ ALKPHOS 345* 514* 411* 353* 298*  BILITOT 0.4 0.4 0.2* 0.3 0.4  PROT 6.1* 6.3* 5.4* 5.3* 6.5  ALBUMIN 2.3* 2.3* 2.1* 2.0* 2.4*   No results for input(s): "LIPASE", "AMYLASE" in the last 168 hours. No results for input(s): "AMMONIA" in the last 168 hours. CBC: Recent Labs  Lab 07/11/22 0631 07/12/22 0609 07/13/22 0501 07/15/22 0555 07/16/22 0529  WBC 13.0* 10.3 8.3 10.6* 11.7*  NEUTROABS  --   --   --   --  9.5*  HGB 8.7* 8.0* 8.0* 8.2* 8.2*  HCT 26.4* 24.4* 25.0* 25.8* 25.8*  MCV 89.2 88.7 90.3 90.5 90.5  PLT 582* 616* 720* 1,001* 1,147*   Cardiac Enzymes: Recent Labs  Lab 07/14/22 1228 07/15/22 0555  CKTOTAL 45* 33*   BNP: BNP (last 3 results) Recent Labs    07/07/22 2332  BNP 197.3*  ProBNP (last 3 results) No results for input(s): "PROBNP" in the last 8760 hours.  CBG: Recent Labs  Lab 07/15/22 2317 07/16/22 0357 07/16/22 0742 07/16/22 1127 07/16/22 1521  GLUCAP 199* 154* 132* 155* 204*    FURTHER DISCHARGE INSTRUCTIONS:   Get Medicines reviewed and adjusted: Please take all your medications with you for your next visit with your Primary MD   Laboratory/radiological data: Please request your Primary MD to go over all hospital tests and procedure/radiological results at the follow up, please ask your Primary MD to get all Hospital records sent to his/her office.   In some cases, they will be blood work, cultures and biopsy results pending at the time of your discharge. Please request that your primary care M.D. goes through all the records of your hospital data and follows up on these results.   Also Note the following: If you experience worsening of your admission symptoms, develop shortness of breath, life threatening emergency, suicidal or homicidal thoughts you must seek medical  attention immediately by calling 911 or calling your MD immediately  if symptoms less severe.   You must read complete instructions/literature along with all the possible adverse reactions/side effects for all the Medicines you take and that have been prescribed to you. Take any new Medicines after you have completely understood and accpet all the possible adverse reactions/side effects.    Do not drive when taking Pain medications or sleeping medications (Benzodaizepines)   Do not take more than prescribed Pain, Sleep and Anxiety Medications. It is not advisable to combine anxiety,sleep and pain medications without talking with your primary care practitioner   Special Instructions: If you have smoked or chewed Tobacco  in the last 2 yrs please stop smoking, stop any regular Alcohol  and or any Recreational drug use.   Wear Seat belts while driving.   Please note: You were cared for by a hospitalist during your hospital stay. Once you are discharged, your primary care physician will handle any further medical issues. Please note that NO REFILLS for any discharge medications will be authorized once you are discharged, as it is imperative that you return to your primary care physician (or establish a relationship with a primary care physician if you do not have one) for your post hospital discharge needs so that they can reassess your need for medications and monitor your lab values.     Signed:  Florencia Reasons MD, PhD, FACP  [facility] 07/16/2022, 7:56 PM

## 2022-07-16 NOTE — Progress Notes (Signed)
PIV removed, pt. Packed home medication, AVS discussed with patient and wife, all questions answered.  Pt. Is awaiting PICC placement and then will be okay to discharge

## 2022-07-16 NOTE — Progress Notes (Addendum)
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 blood glucose 144  no acute complaints, wondering when he can go home  Assessment & Plan:  Principal Problem:   Symptomatic anemia Active Problems:   Type 1 diabetes mellitus (HCC)   Asthma   Prostate cancer ([place])   Hyperlipidemia   Essential hypertension   Hematuria   Hyponatremia   Hypokalemia   AKI (acute kidney injury) ([place] [place])    Assessment and Plan:   Blood loss anemia/symptomatic anemia, presenting symptoms -Presented with gross hematuria with history of radiation cystitis/metastatic prostate cancer on ADT/Xtandi -Seen by urology Dr Alinda Money, hematuria clearing up, no indication for catheter or other intervention -Hemoglobin 6.3 on presentation, received PRBC x 3 unit, iron deficiency anemia, TSAT 5% - ID prefers to defer iv iron for now due to active infection, can get iv iron after finishing iv abx treatment,  message sent to hematology/oncology Dr Julien Nordmann    Thrombocytosis -Platelet 480 on presentation, increased to 1000 , case discussed with hematology oncology Dr. Julien Nordmann states thrombocytosis likely reactive due to iron deficiency anemia, he recommended IV iron infusion, advised platelet showed start to come down in about a week after iron infusion, per oncology ,currently does not need to take aspirin, -defer iv iron for now per ID recommendation,  f/u with hematology oncology   Polymicrobial bacteremia/polymicrobial UTI -Initial urine culture and Initial blood culture positive for MSSA and  Enterococcus faecalis. ID was consulted , was on cefazolin , started daptomycin on 07/14/2022 CPK 45 TEE  on 1/31  repeat blood culture from 1/30 no growthx48hrs, picc line placement ordered on 2/1 -ID cleared him to go home once picc line placed , plan to finish iv daptomycin x14days from 1/30  AKI Resolved  Hyponatremia/Hypokalemia/Hypomagnesemia Improved  Insulin-dependent type 2 diabetes, uncontrolled with hyperglycemia A1c 7.7 in 03/2022 Elevated blood glucose due to infection and acute distress Adjust insulin while in the hospital Plan to discharge on home regimen once infection under control And follow-up with Dr. Forde Dandy  HTN, BP stable on current regimen HLD, hold statin, monitor CK while on daptomycin, resume statin once complete daptomycin treatment  Chronic bilateral lower extremity edema, report due to Xtandi, no change from baseline   Body mass index is 29.41 kg/m.Marland Kitchen  Meet criteria for obesity      I have Reviewed nursing notes, Vitals, pain scores, I/o's, Lab results and  imaging results since pt's last encounter, details please see discussion above  I ordered the following labs:  Unresulted Labs (From admission, onward)     Start     Ordered   07/15/22 0500  CK  Weekly,   R     Question:  Specimen collection method  Answer:  Lab=Lab collect   07/14/22 1232             DVT prophylaxis: SCDs Start: 07/08/22 0856, no pharmacological DVT prophylaxis due to hematuria   Code Status:   Code Status: Full Code  Family Communication: Patient Disposition:    Dispo: The patient is from: Home              Anticipated d/c is to: Home with [facility]  Anticipated d/c date is: Once picc line placed   Antimicrobials:    Anti-infectives (From admission, onward)    Start     Dose/Rate Route Frequency Ordered Stop   07/16/22 0000  daptomycin (CUBICIN) IVPB        800 mg Intravenous Every 24 hours 07/16/22 1406 07/27/22 2359   07/15/22 2000  DAPTOmycin (CUBICIN) 800 mg in sodium chloride 0.9 % IVPB        10 mg/kg  81.9 kg (Adjusted) 132 mL/hr over 30 Minutes  Intravenous Daily 07/15/22 0825     07/14/22 1600  DAPTOmycin (CUBICIN) 750 mg in sodium chloride 0.9 % IVPB  Status:  Discontinued        8 mg/kg  95.2 kg 130 mL/hr over 30 Minutes Intravenous Daily 07/14/22 1206 07/15/22 0825   07/14/22 0400  ampicillin (OMNIPEN) 2 g in sodium chloride 0.9 % 100 mL IVPB  Status:  Discontinued        2 g 300 mL/hr over 20 Minutes Intravenous Every 4 hours 07/14/22 0314 07/14/22 1206   07/10/22 0800  ceFAZolin (ANCEF) IVPB 2g/100 mL premix  Status:  Discontinued        2 g 200 mL/hr over 30 Minutes Intravenous Every 8 hours 07/10/22 0712 07/14/22 1206   07/09/22 1000  cefTRIAXone (ROCEPHIN) 1 g in sodium chloride 0.9 % 100 mL IVPB  Status:  Discontinued        1 g 200 mL/hr over 30 Minutes Intravenous Every 24 hours 07/09/22 0915 07/10/22 0712          Objective: Vitals:   07/15/22 1959 07/16/22 0358 07/16/22 0836 07/16/22 1515  BP: 137/64 (!) 153/77 (!) 146/76 139/69  Pulse: 92 89 88 89  Resp: '18 18 18 16  '$ Temp: 98.2 F (36.8 C) 98.4 F (36.9 C) 98 F (36.7 C) 98.2 F (36.8 C)  TempSrc: Oral Oral Oral Oral  SpO2: 98% 98% 98% 100%  Weight:      Height:        Intake/Output Summary (Last 24 hours) at 07/16/2022 1546 Last data filed at 07/16/2022 9147 Gross per 24 hour  Intake 1276 ml  Output --  Net 1276 ml   Filed Weights   07/07/22 1915 07/15/22 1307  Weight: 95.2 kg 93 kg    Examination:  General exam: alert, awake, communicative,calm, NAD Respiratory system: Clear to auscultation. Respiratory effort normal. Cardiovascular system:  RRR.  Gastrointestinal system: Abdomen is nondistended, soft and nontender.  Normal bowel sounds heard. Central nervous system: Alert and oriented. No focal neurological deficits. Extremities: Bilateral lower extremity edema, reports chronic, no change from baseline Skin: No rashes, lesions or ulcers Psychiatry: Judgement and insight appear normal. Mood & affect appropriate.     Data Reviewed: I  have personally reviewed  labs and visualized  imaging studies since the last encounter and formulate the plan        Scheduled Meds:  ALPRAZolam  0.25 mg Oral QHS   amLODipine  5 mg Oral Daily   enzalutamide  80 mg Oral BID WC   insulin aspart  0-9 Units Subcutaneous Q4H   insulin aspart  3 Units Subcutaneous TID WC   insulin glargine-yfgn  40 Units Subcutaneous Daily   ramipril  10 mg Oral Daily   Continuous Infusions:  DAPTOmycin (CUBICIN) 800 mg in sodium chloride 0.9 % IVPB 800 mg (07/16/22 1513)     LOS: 8 days   Time spent: 58mns  FFlorencia Reasons MD PhD FACP [facility]  Available via Epic secure chat 7am-7pm for nonurgent issues Please page for urgent issues To page the attending provider between 7A-7P or the covering provider during after hours 7P-7A, please log into the web site [email or web address] and access using universal [place] password for that web site. If you do not have the password, please call the hospital operator.    07/16/2022, 3:46 PM

## 2022-07-16 NOTE — Progress Notes (Signed)
Peripherally Inserted Central Catheter Placement  The IV Nurse has discussed with the patient and/or persons authorized to consent for the patient, the purpose of this procedure and the potential benefits and risks involved with this procedure.  The benefits include less needle sticks, lab draws from the catheter, and the patient may be discharged home with the catheter. Risks include, but not limited to, infection, bleeding, blood clot (thrombus formation), and puncture of an artery; nerve damage and irregular heartbeat and possibility to perform a PICC exchange if needed/ordered by physician.  Alternatives to this procedure were also discussed.  Bard Power PICC patient education guide, fact sheet on infection prevention and patient information card has been provided to patient /or left at bedside.    PICC Placement Documentation  PICC Single Lumen 86/16/83 Right Basilic 37 cm 0 cm (Active)  Indication for Insertion or Continuance of Line Home intravenous therapies (PICC only) 07/16/22 1926  Exposed Catheter (cm) 0 cm 07/16/22 1926  Site Assessment Clean, Dry, Intact 07/16/22 1926  Line Status Flushed;Saline locked;Blood return noted 07/16/22 1926  Dressing Type Transparent;Securing device 07/16/22 1926  Dressing Status Antimicrobial disc in place 07/16/22 [place] Not Applicable 72/90/21 1155  Line Care Connections checked and tightened 07/16/22 1926  Line Adjustment (NICU/IV Team Only) No 07/16/22 1926  Dressing Intervention New dressing 07/16/22 1926  Dressing Change Due 07/23/22 07/16/22 [place] 07/16/2022, 7:28 PM

## 2022-07-17 ENCOUNTER — Telehealth: Payer: Self-pay | Admitting: Internal Medicine

## 2022-07-17 NOTE — Telephone Encounter (Signed)
Scheduled appt per 2/1 referral. Pt is aware of appt date and time. Pt is aware to arrive 15 mins prior to appt time and to bring and updated insurance card. Pt is aware of appt location.

## 2022-07-19 LAB — CULTURE, BLOOD (ROUTINE X 2)
Culture: NO GROWTH
Culture: NO GROWTH
Special Requests: ADEQUATE
Special Requests: ADEQUATE

## 2022-07-21 ENCOUNTER — Telehealth: Payer: Self-pay

## 2022-07-21 NOTE — Telephone Encounter (Signed)
At 9:09 received a call from [place] at [facility] [phone]) for critical lab of platelets 1064.

## 2022-07-24 LAB — ECHO TEE

## 2022-07-28 ENCOUNTER — Telehealth: Payer: Self-pay

## 2022-07-28 ENCOUNTER — Other Ambulatory Visit: Payer: Self-pay

## 2022-07-28 DIAGNOSIS — D696 Thrombocytopenia, unspecified: Secondary | ICD-10-CM

## 2022-07-28 NOTE — Telephone Encounter (Signed)
Per 07/16/22 note from Dr. Baxter Flattery, patient needs clinic follow up in 2-3 weeks.   Beryle Flock, RN

## 2022-07-28 NOTE — Telephone Encounter (Signed)
Pt called and left VM checking to see if he is in need of a f/u appt after his d/c. Did see a prev note regarding pt's labs and that Dr. Baxter Flattery was working with him while he was an inpatient. I wanted to verify prior to scheduling.

## 2022-07-29 ENCOUNTER — Encounter: Payer: Self-pay | Admitting: Internal Medicine

## 2022-07-29 ENCOUNTER — Inpatient Hospital Stay: Payer: BC Managed Care – PPO | Attending: Internal Medicine | Admitting: Internal Medicine

## 2022-07-29 ENCOUNTER — Inpatient Hospital Stay: Payer: BC Managed Care – PPO

## 2022-07-29 ENCOUNTER — Other Ambulatory Visit: Payer: Self-pay

## 2022-07-29 VITALS — BP 153/78 | HR 93 | Resp 19 | Wt 207.4 lb

## 2022-07-29 DIAGNOSIS — Z794 Long term (current) use of insulin: Secondary | ICD-10-CM | POA: Insufficient documentation

## 2022-07-29 DIAGNOSIS — D75838 Other thrombocytosis: Secondary | ICD-10-CM | POA: Diagnosis present

## 2022-07-29 DIAGNOSIS — Z79899 Other long term (current) drug therapy: Secondary | ICD-10-CM | POA: Insufficient documentation

## 2022-07-29 DIAGNOSIS — Z8546 Personal history of malignant neoplasm of prostate: Secondary | ICD-10-CM | POA: Diagnosis not present

## 2022-07-29 DIAGNOSIS — E119 Type 2 diabetes mellitus without complications: Secondary | ICD-10-CM | POA: Insufficient documentation

## 2022-07-29 DIAGNOSIS — D696 Thrombocytopenia, unspecified: Secondary | ICD-10-CM

## 2022-07-29 DIAGNOSIS — Z87891 Personal history of nicotine dependence: Secondary | ICD-10-CM | POA: Diagnosis not present

## 2022-07-29 DIAGNOSIS — I10 Essential (primary) hypertension: Secondary | ICD-10-CM | POA: Insufficient documentation

## 2022-07-29 DIAGNOSIS — D649 Anemia, unspecified: Secondary | ICD-10-CM

## 2022-07-29 DIAGNOSIS — D509 Iron deficiency anemia, unspecified: Secondary | ICD-10-CM | POA: Insufficient documentation

## 2022-07-29 LAB — COMPREHENSIVE METABOLIC PANEL
ALT: 12 U/L (ref 0–44)
AST: 15 U/L (ref 15–41)
Albumin: 3.6 g/dL (ref 3.5–5.0)
Alkaline Phosphatase: 129 U/L — ABNORMAL HIGH (ref 38–126)
Anion gap: 7 (ref 5–15)
BUN: 15 mg/dL (ref 6–20)
CO2: 27 mmol/L (ref 22–32)
Calcium: 9.6 mg/dL (ref 8.9–10.3)
Chloride: 100 mmol/L (ref 98–111)
Creatinine, Ser: 1.04 mg/dL (ref 0.61–1.24)
GFR, Estimated: >60 mL/min (ref >60)
Glucose, Bld: 166 mg/dL — ABNORMAL HIGH (ref 70–99)
Potassium: 4.2 mmol/L (ref 3.5–5.1)
Sodium: 134 mmol/L — ABNORMAL LOW (ref 135–145)
Total Bilirubin: 0.2 mg/dL — ABNORMAL LOW (ref 0.3–1.2)
Total Protein: 7.1 g/dL (ref 6.5–8.1)

## 2022-07-29 LAB — CBC WITH DIFFERENTIAL/PLATELET
Abs Immature Granulocytes: 0.02 10*3/uL (ref 0.00–0.07)
Basophils Absolute: 0 10*3/uL (ref 0.0–0.1)
Basophils Relative: 1 %
Eosinophils Absolute: 0.2 10*3/uL (ref 0.0–0.5)
Eosinophils Relative: 3 %
HCT: 26.1 % — ABNORMAL LOW (ref 39.0–52.0)
Hemoglobin: 8.7 g/dL — ABNORMAL LOW (ref 13.0–17.0)
Immature Granulocytes: 0 %
Lymphocytes Relative: 11 %
Lymphs Abs: 0.6 10*3/uL — ABNORMAL LOW (ref 0.7–4.0)
MCH: 28.5 pg (ref 26.0–34.0)
MCHC: 33.3 g/dL (ref 30.0–36.0)
MCV: 85.6 fL (ref 80.0–100.0)
Monocytes Absolute: 0.6 10*3/uL (ref 0.1–1.0)
Monocytes Relative: 11 %
Neutro Abs: 3.8 10*3/uL (ref 1.7–7.7)
Neutrophils Relative %: 74 %
Platelets: 423 10*3/uL — ABNORMAL HIGH (ref 150–400)
RBC: 3.05 MIL/uL — ABNORMAL LOW (ref 4.22–5.81)
RDW: 13.8 % (ref 11.5–15.5)
WBC: 5.1 10*3/uL (ref 4.0–10.5)
nRBC: 0 % (ref 0.0–0.2)

## 2022-07-29 LAB — FERRITIN: Ferritin: 49 ng/mL (ref 24–336)

## 2022-07-29 LAB — VITAMIN B12: Vitamin B-12: 478 pg/mL (ref 180–914)

## 2022-07-29 LAB — IRON AND IRON BINDING CAPACITY (CC-WL,HP ONLY)
Iron: 36 ug/dL — ABNORMAL LOW (ref 45–182)
Saturation Ratios: 9 % — ABNORMAL LOW (ref 17.9–39.5)
TIBC: 423 ug/dL (ref 250–450)
UIBC: 387 ug/dL — ABNORMAL HIGH (ref 117–376)

## 2022-07-29 LAB — FOLATE: Folate: 11.4 ng/mL (ref >5.9)

## 2022-07-29 NOTE — Progress Notes (Signed)
[place] [phone]   [phone]  CONSULT NOTE  REFERRING PHYSICIAN: Dr. Florencia [name]  REASON FOR CONSULTATION:  57 years old white male with thrombocytosis and anemia.  HPI [name] is a 57 y.o. male with past medical history significant for type 1 diabetes mellitus, asthma, history of prostate adenocarcinoma with Gleason score of 84+4 diagnosed in 2017 status post prostatectomy under the care of Dr. Alinda [name].  He is currently undergoing treatment with Lupron and Xtandi by Dr. Alinda [name].  He also underwent palliative radiotherapy under the care of Dr. Tammi [name] with a resultant of radiation cystitis in March 2023.  The patient has been complaining of passing clots in his urine for several months.  He was admitted to the hospital on July 07, 2022 complaining of significant fatigue and weakness as well as the hematuria.  CBC on the day of admission showed hemoglobin of 6.3 and hematocrit 19.1% with normal platelets count.  The patient received 3 units of PRBCs transfusion during his hospitalization and he started treatment for UTI with cefazolin and daptomycin.  During his admission he was noted to have elevated platelet count that reach 1,147,000 on 07/16/2022.  I was called by the hospitalist during his admission regarding this abnormality and they indicated for them that this is likely reactive thrombocytosis secondary to his significant iron deficiency anemia.  I recommended for him to receive iron infusion but because of his current urinary tract infection and treatment with daptomycin infectious disease recommended to hold on the iron infusion until completion of his treatment.  The patient was referred to me for evaluation after his discharge from the hospital. When seen today he continues to complain of fatigue as well as the bladder spasm and intermittent clots in his urine.  He takes a nap every day.  He denied having any current chest pain, shortness of  breath, cough or hemoptysis.  He has no nausea, vomiting, diarrhea or constipation.  He has no headache or visual changes.  He has no recent weight loss or night sweats. Family history: He is adopted. The patient is married and has 1 son age 62.  He works in a [employer], engineering.  He was accompanied by his wife [name].  The patient has remote history of smoking during college but quit more than 20 years ago.  He has no history of alcohol or drug abuse.  HPI  Past Medical History:  Diagnosis Date   Asthma    Cancer ([place])    prostate cancer    Diabetes mellitus without complication ([place])    History of bronchitis    Hypertension    Prostate cancer ([place])    Wears glasses     Past Surgical History:  Procedure Laterality Date   CYSTOSCOPY WITH FULGERATION N/A 03/21/2022   Procedure: CYSTOSCOPY WITH FULGERATION;  Surgeon: Dillon Lima, MD;  Location: [facility];  Service: Urology;  Laterality: N/A;   CYSTOSCOPY WITH FULGERATION N/A 06/22/2022   Procedure: CYSTOSCOPY WITH FULGERATION;  Surgeon: Dillon Bring, MD;  Location: [facility];  Service: Urology;  Laterality: N/A;  30 MINS FOR CASE   EYE SURGERY     laser eye surgery bilat    left groin hernia      LYMPHADENECTOMY Bilateral 09/02/2015   Procedure: BILATERAL LYMPHADENECTOMY;  Surgeon: Dillon Bring, MD;  Location: [facility];  Service: Urology;  Laterality: Bilateral;   ROBOT ASSISTED LAPAROSCOPIC RADICAL PROSTATECTOMY N/A 09/02/2015   Procedure: XI ROBOTIC ASSISTED LAPAROSCOPIC  RADICAL PROSTATECTOMY LEVEL 2;  Surgeon: Dillon Bring, MD;  Location: [facility];  Service: Urology;  Laterality: N/A;   schwannoma     removed approx 16 to 18 years ago    TEE WITHOUT CARDIOVERSION N/A 07/15/2022   Procedure: TRANSESOPHAGEAL ECHOCARDIOGRAM (TEE);  Surgeon: Dillon Flock, DO;  Location: [facility];  Service: Cardiovascular;  Laterality: N/A;   TONSILLECTOMY      No family history on file.  Social History Social History   Tobacco Use    Smoking status: Former    Packs/day: 0.50    Years: 7.00    Total pack years: 3.50    Types: Cigarettes    Quit date: 06/15/1998    Years since quitting: 24.1   Smokeless tobacco: Never  Vaping Use   Vaping Use: Never used  Substance Use Topics   Alcohol use: Yes    Comment: beer daily Monday through Friday 5 beers through the weekends   Drug use: No    No Known Allergies  Current Outpatient Medications  Medication Sig Dispense Refill   albuterol (VENTOLIN HFA) 108 (90 Base) MCG/ACT inhaler Inhale 2 puffs into the lungs every 6 (six) hours as needed for wheezing or shortness of breath.     ALPRAZolam (XANAX) 0.25 MG tablet Take 0.25 mg by mouth 3 (three) times daily as needed for anxiety.     amLODipine (NORVASC) 5 MG tablet Take 5 mg by mouth daily.     Continuous Blood Gluc Sensor (FREESTYLE LIBRE 2 SENSOR) MISC USE TO MONITOR CBG EVERY 14 DAYS AS DIRECTED     Insulin Pen Needle (B-D UF III MINI PEN NEEDLES) 31G X 5 MM MISC USE TO INJECT INSULIN 4 TIMES DAILY E10.9     Insulin Syringe-Needle U-100 (B-D INS SYRINGE 0.5CC/31GX5/16) 31G X 5/16" 0.5 ML MISC use 1 syringe as directed (does 5 injections a day     LYUMJEV TEMPO PEN 100 UNIT/ML SOPN Inject 0-12 Units into the skin as directed. Sliding Scale     oxyCODONE (ROXICODONE) 5 MG immediate release tablet Take 1 tablet (5 mg total) by mouth every 8 (eight) hours as needed. (Patient taking differently: Take 5 mg by mouth every 8 (eight) hours as needed for severe pain.) 20 tablet 0   ramipril (ALTACE) 10 MG capsule Take 10 mg by mouth daily.     rosuvastatin (CRESTOR) 5 MG tablet Take 1 tablet (5 mg total) by mouth 2 (two) times a week. Please hold  rosuvastatin for now, resume after  IV antibiotic treatment completed     TRESIBA FLEXTOUCH 200 UNIT/ML SOPN Inject 40 Units into the skin every morning.     XTANDI 40 MG tablet Take 80 mg by mouth 2 (two) times daily with breakfast and lunch. Taking first dose after breakfast & second dose  after lunch. Dr [name] aware     No current facility-administered medications for this visit.    Review of Systems  Constitutional: positive for fatigue Eyes: negative Ears, nose, mouth, throat, and face: negative Respiratory: positive for dyspnea on exertion Cardiovascular: negative Gastrointestinal: negative Genitourinary:positive for hematuria Integument/breast: negative Hematologic/lymphatic: negative Musculoskeletal:negative Neurological: negative Behavioral/Psych: negative Endocrine: negative Allergic/Immunologic: negative  Physical Exam  [id number], healthy, no distress, well nourished, and well developed SKIN: skin color, texture, turgor are normal, no rashes or significant lesions HEAD: Normocephalic, No masses, lesions, tenderness or abnormalities EYES: normal, PERRLA, Conjunctiva are pink and non-injected EARS: External ears normal, Canals clear OROPHARYNX:no exudate, no erythema, and lips, buccal mucosa, and  tongue normal  NECK: supple, no adenopathy, no JVD LYMPH:  no palpable lymphadenopathy, no hepatosplenomegaly LUNGS: clear to auscultation , and palpation HEART: regular rate & rhythm, no murmurs, and no gallops ABDOMEN:abdomen soft, non-tender, normal bowel sounds, and no masses or organomegaly BACK: Back symmetric, no curvature., No CVA tenderness EXTREMITIES:no joint deformities, effusion, or inflammation, no edema  NEURO: alert & oriented x 3 with fluent speech, no focal motor/sensory deficits  PERFORMANCE STATUS: ECOG 1  LABORATORY DATA: Lab Results  Component Value Date   WBC 5.1 07/29/2022   HGB 8.7 (L) 07/29/2022   HCT 26.1 (L) 07/29/2022   MCV 85.6 07/29/2022   PLT 423 (H) 07/29/2022      Chemistry      Component Value Date/Time   NA 135 07/16/2022 0529   K 4.4 07/16/2022 0529   CL 97 (L) 07/16/2022 0529   CO2 26 07/16/2022 0529   BUN 11 07/16/2022 0529   CREATININE 1.09 07/16/2022 0529      Component Value Date/Time   CALCIUM 8.9  07/16/2022 0529   ALKPHOS 298 (H) 07/15/2022 0555   AST 19 07/15/2022 0555   ALT 12 07/15/2022 0555   BILITOT 0.4 07/15/2022 0555       RADIOGRAPHIC STUDIES: ECHO TEE  Result Date: 07/24/2022    TRANSESOPHOGEAL ECHO REPORT   Patient Name:   [name] Date of Exam: 07/15/2022 Medical Rec #:  [id number]     Height:       70.0 in Accession #:    [id number]    Weight:       205.0 lb Date of Birth:  10/26/1965     BSA:          2.109 m Patient Age:    50 years      BP:           156/76 mmHg Patient Gender: M             HR:           82 bpm. Exam Location:  Inpatient Procedure: Transesophageal Echo, Cardiac Doppler and Color Doppler Indications:    Endocarditis  History:        Patient has prior history of Echocardiogram examinations, most                 recent 07/11/2022. Risk Factors:Hypertension and Diabetes.                 Cancer.  Sonographer:    Eartha Inch Referring Phys: [name] PROCEDURE: After discussion of the risks and benefits of a TEE, an informed consent was obtained from the patient. TEE procedure time was 11 minutes. The transesophogeal probe was passed without difficulty through the esophogus of the patient. Imaged were obtained with the patient in a left lateral decubitus position. Sedation performed by different physician. The patient was monitored while under deep sedation. Anesthestetic sedation was provided intravenously by Anesthesiology: 262.49m of Propofol. Image quality was good. The patient's vital signs; including heart rate, blood pressure, and oxygen saturation; remained stable throughout the procedure. The patient developed no complications during the procedure.  IMPRESSIONS  1. Left ventricular ejection fraction, by estimation, is 60 to 65%. The left ventricle has normal function. The left ventricle has no regional wall motion abnormalities. Left ventricular diastolic function could not be evaluated.  2. Right ventricular systolic function is normal. The right  ventricular size is normal.  3. No left atrial/left atrial appendage thrombus was detected.  4.  The mitral valve is grossly normal. Mild mitral valve regurgitation.  5. The aortic valve is grossly normal. Aortic valve regurgitation is not visualized. Conclusion(s)/Recommendation(s): Normal biventricular function without evidence of hemodynamically significant valvular heart disease. No evidence of vegetation/infective endocarditis on this transesophageael echocardiogram. FINDINGS  Left Ventricle: Left ventricular ejection fraction, by estimation, is 60 to 65%. The left ventricle has normal function. The left ventricle has no regional wall motion abnormalities. The left ventricular internal cavity size was normal in size. There is  no left ventricular hypertrophy. Left ventricular diastolic function could not be evaluated. Right Ventricle: The right ventricular size is normal. No increase in right ventricular wall thickness. Right ventricular systolic function is normal. Left Atrium: Left atrial size was normal in size. No left atrial/left atrial appendage thrombus was detected. Right Atrium: Right atrial size was normal in size. Pericardium: There is no evidence of pericardial effusion. Mitral Valve: The mitral valve is grossly normal. Mild mitral valve regurgitation. Tricuspid Valve: The tricuspid valve is grossly normal. Tricuspid valve regurgitation is not demonstrated. Aortic Valve: The aortic valve is grossly normal. Aortic valve regurgitation is not visualized. Pulmonic Valve: The pulmonic valve was grossly normal. Pulmonic valve regurgitation is not visualized. Aorta: The aortic root is normal in size and structure. IAS/Shunts: No atrial level shunt detected by color flow Doppler. Additional Comments: Spectral Doppler performed. Collene Mares Custovic Electronically signed by [name] Signature Date/Time: 07/24/2022/12:39:58 PM    Final    [place] EKG SITE RITE  Result Date: 07/16/2022 If Site Rite image not  attached, placement could not be confirmed due to current cardiac rhythm.  [place] EKG SITE RITE  Result Date: 07/14/2022 If Site Rite image not attached, placement could not be confirmed due to current cardiac rhythm.  ECHOCARDIOGRAM COMPLETE  Result Date: 07/11/2022    ECHOCARDIOGRAM REPORT   Patient Name:   [name] Date of Exam: 07/11/2022 Medical Rec #:  [id number]     Height:       70.0 in Accession #:    [id number]    Weight:       209.9 lb Date of Birth:  [birth year]     BSA:          2.131 m Patient Age:    37 years      BP:           176/70 mmHg Patient Gender: M             HR:           87 bpm. Exam Location:  Inpatient Procedure: 2D Echo, Cardiac Doppler and Color Doppler Indications:    Bacteremia R78.81  History:        Patient has no prior history of Echocardiogram examinations.                 Risk Factors:Diabetes and Hypertension.  Sonographer:    Bernadene Person RDCS Referring Phys: [id number] [place]  1. Left ventricular ejection fraction, by estimation, is 60 to 65%. The left ventricle has normal function. The left ventricle has no regional wall motion abnormalities. Left ventricular diastolic parameters were normal.  2. Right ventricular systolic function is normal. The right ventricular size is normal. Tricuspid regurgitation signal is inadequate for assessing PA pressure.  3. Right atrial size was mildly dilated.  4. The mitral valve is normal in structure. Trivial mitral valve regurgitation. No evidence of mitral stenosis.  5. The aortic valve is tricuspid. Aortic valve regurgitation is not  visualized. No aortic stenosis is present.  6. The inferior vena cava is normal in size with greater than 50% respiratory variability, suggesting right atrial pressure of 3 mmHg. Comparison(s): No prior Echocardiogram. FINDINGS  Left Ventricle: Left ventricular ejection fraction, by estimation, is 60 to 65%. The left ventricle has normal function. The left ventricle has no  regional wall motion abnormalities. The left ventricular internal cavity size was normal in size. There is  no left ventricular hypertrophy. Left ventricular diastolic parameters were normal. Right Ventricle: The right ventricular size is normal. Right ventricular systolic function is normal. Tricuspid regurgitation signal is inadequate for assessing PA pressure. The tricuspid regurgitant velocity is 2.47 m/s, and with an assumed right atrial  pressure of 3 mmHg, the estimated right ventricular systolic pressure is [id number]. Left Atrium: Left atrial size was normal in size. Right Atrium: Right atrial size was mildly dilated. Pericardium: There is no evidence of pericardial effusion. Mitral Valve: The mitral valve is normal in structure. Trivial mitral valve regurgitation. No evidence of mitral valve stenosis. Tricuspid Valve: The tricuspid valve is normal in structure. Tricuspid valve regurgitation is trivial. No evidence of tricuspid stenosis. Aortic Valve: The aortic valve is tricuspid. Aortic valve regurgitation is not visualized. No aortic stenosis is present. Pulmonic Valve: The pulmonic valve was normal in structure. Pulmonic valve regurgitation is trivial. No evidence of pulmonic stenosis. Aorta: The aortic root is normal in size and structure. Venous: The inferior vena cava is normal in size with greater than 50% respiratory variability, suggesting right atrial pressure of 3 mmHg. IAS/Shunts: No atrial level shunt detected by color flow Doppler.  LEFT VENTRICLE PLAX 2D LVIDd:         5.00 cm      Diastology LVIDs:         3.40 cm      LV e' medial:    8.27 cm/s LV PW:         0.90 cm      LV E/e' medial:  13.3 LV IVS:        0.90 cm      LV e' lateral:   9.90 cm/s LVOT diam:     2.00 cm      LV E/e' lateral: 11.1 LV SV:         67 LV SV Index:   31 LVOT Area:     3.14 cm  LV Volumes (MOD) LV vol d, MOD A2C: 128.0 ml LV vol d, MOD A4C: 111.0 ml LV vol s, MOD A2C: 52.0 ml LV vol s, MOD A4C: 44.7 ml LV SV MOD  A2C:     76.0 ml LV SV MOD A4C:     111.0 ml LV SV MOD BP:      72.3 ml RIGHT VENTRICLE RV S prime:     13.90 cm/s TAPSE (M-mode): 2.4 cm LEFT ATRIUM             Index        RIGHT ATRIUM           Index LA diam:        4.00 cm 1.88 cm/m   RA Area:     21.30 cm LA Vol (A2C):   48.3 ml 22.67 ml/m  RA Volume:   73.70 ml  34.59 ml/m LA Vol (A4C):   37.6 ml 17.65 ml/m LA Biplane Vol: 44.4 ml 20.84 ml/m  AORTIC VALVE LVOT Vmax:   113.00 cm/s LVOT Vmean:  75.800 cm/s LVOT VTI:  0.212 m  AORTA Ao Root diam: 3.40 cm Ao Asc diam:  3.00 cm MITRAL VALVE                TRICUSPID VALVE MV Area (PHT): 4.06 cm     TR Peak grad:   24.4 mmHg MV Decel Time: 187 msec     TR Vmax:        247.00 cm/s MV E velocity: 110.00 cm/s MV A velocity: 74.90 cm/s   SHUNTS MV E/A ratio:  1.47         Systemic VTI:  0.21 m                             Systemic Diam: 2.00 cm Kirk Ruths MD Electronically signed by Kirk Ruths MD Signature Date/Time: 07/11/2022/3:00:12 PM    Final    US Renal  Result Date: 07/07/2022 CLINICAL DATA:  Acute renal insufficiency. EXAM: RENAL / URINARY TRACT ULTRASOUND COMPLETE COMPARISON:  CT abdomen pelvis dated 03/21/2022. FINDINGS: Right Kidney: Renal measurements: 11.4 x 6.1 x 6.0 cm = volume: 216 mL. Normal echogenicity. No hydronephrosis or shadowing stone. Left Kidney: Renal measurements: 12.7 x 6.3 x 6.9 cm = volume: 290 mL. Normal echogenicity. No hydronephrosis or shadowing stone. Bladder: There is slight irregularity of the bladder wall which may represent trabeculation related to chronic bladder outlet obstruction. Focal area of nodular protrusion from the right lateral bladder wall, likely related to underdistention. A bladder lesion is not excluded. Clinical correlation and further evaluation with cystoscopy, if clinically indicated, recommended. Other: The prostate gland is enlarged measuring 6.0 x 3.7 x 3.7 cm. There is median lobe hypertrophy indenting the base of the bladder. IMPRESSION:  1. Unremarkable kidneys. 2. Focal protrusion of the right bladder wall may be related to underdistention. A bladder lesion is not entirely excluded clinical correlation is recommended. 3. Enlarged prostate gland. Electronically Signed   By: Anner Crete M.D.   On: 07/07/2022 22:37    ASSESSMENT: This is a very pleasant 57 years old white male with iron deficiency anemia and reactive thrombocytosis secondary to hematuria with blood clotting from radiation cystitis.  The patient also has a history of prostate cancer and followed by Dr. Alinda [name] and currently on treatment with Lupron and Xtandi   PLAN: I had a lengthy discussion with the patient and his wife today about his current condition and treatment options. I ordered several studies today including repeat CBC that showed hemoglobin of 8.7 and hematocrit 26.1.  His platelets count improved down to 423,000.  Iron studies showed the persistent iron deficiency with serum iron of 36 and iron saturation 9%.  Ferritin level is still pending.  Vitamin B12 and serum folate are normal. I recommended for the patient to proceed with iron infusion with Venofer 300 Mg IV weekly for 3 weeks. I will see him back for follow-up visit in 2 months for evaluation and repeat CBC, iron study and ferritin. For the persistent hematuria and clots in his urine, he is followed by Dr. Alinda [name] and currently has Foley's catheter placed. The patient was advised to call immediately if he has any other concerning symptoms in the interval. The patient voices understanding of current disease status and treatment options and is in agreement with the current care plan.  All questions were answered. The patient knows to call the clinic with any problems, questions or concerns. We can certainly see the patient much sooner if necessary.  Thank you so much for allowing me to participate in the care of [name]. I will continue to follow up the patient with you and assist in his  care.  The total time spent in the appointment was 60 minutes.  Disclaimer: This note was dictated with voice recognition software. Similar sounding words can inadvertently be transcribed and may not be corrected upon review.   Eilleen Kempf July 29, 2022, 11:41 AM

## 2022-07-31 ENCOUNTER — Other Ambulatory Visit: Payer: Self-pay | Admitting: Medical Oncology

## 2022-07-31 ENCOUNTER — Other Ambulatory Visit: Payer: Self-pay | Admitting: Physician Assistant

## 2022-08-04 ENCOUNTER — Telehealth: Payer: Self-pay | Admitting: Pharmacy Technician

## 2022-08-04 NOTE — Telephone Encounter (Signed)
Auth Submission: NO AUTH NEEDED Payer: [employer] Medication & CPT/J Code(s) submitted: Venofer (Iron Sucrose) J1756 Route of submission (phone, fax, portal):  Phone # Fax # Auth type: Buy/Bill Units/visits requested: 3 Reference number:  Approval from: 08/04/22 to 121/31/24

## 2022-08-06 ENCOUNTER — Ambulatory Visit (INDEPENDENT_AMBULATORY_CARE_PROVIDER_SITE_OTHER): Payer: BC Managed Care – PPO | Admitting: *Deleted

## 2022-08-06 VITALS — BP 143/80 | HR 86 | Temp 98.1°F | Resp 18 | Ht 70.0 in | Wt 209.4 lb

## 2022-08-06 DIAGNOSIS — D649 Anemia, unspecified: Secondary | ICD-10-CM

## 2022-08-06 MED ORDER — ACETAMINOPHEN 325 MG PO TABS
650.0000 mg | ORAL_TABLET | Freq: Once | ORAL | Status: AC
  Administered 2022-08-06: 650 mg via ORAL
  Filled 2022-08-06: qty 2

## 2022-08-06 MED ORDER — SODIUM CHLORIDE 0.9 % IV SOLN
300.0000 mg | Freq: Once | INTRAVENOUS | Status: AC
  Administered 2022-08-06: 300 mg via INTRAVENOUS
  Filled 2022-08-06: qty 15

## 2022-08-06 MED ORDER — DIPHENHYDRAMINE HCL 25 MG PO CAPS
25.0000 mg | ORAL_CAPSULE | Freq: Once | ORAL | Status: AC
  Administered 2022-08-06: 25 mg via ORAL
  Filled 2022-08-06: qty 1

## 2022-08-06 NOTE — Progress Notes (Signed)
Diagnosis: Iron Deficiency Anemia  Provider:  Marshell Garfinkel MD  Procedure: Infusion  IV Type: Peripheral, IV Location: L Antecubital  Venofer (Iron Sucrose), Dose: 300 mg  Infusion Start Time: [id number] pm  Infusion Stop Time: 1549 pm  Post Infusion IV Care: Observation period completed and Peripheral IV Discontinued  Discharge: Condition: Good, Destination: Home . AVS Provided  Performed by:  Oren Beckmann, RN

## 2022-08-13 ENCOUNTER — Ambulatory Visit (INDEPENDENT_AMBULATORY_CARE_PROVIDER_SITE_OTHER): Payer: BC Managed Care – PPO | Admitting: *Deleted

## 2022-08-13 VITALS — BP 158/77 | HR 89 | Temp 98.3°F | Resp 18 | Ht 70.0 in | Wt 213.6 lb

## 2022-08-13 DIAGNOSIS — D649 Anemia, unspecified: Secondary | ICD-10-CM | POA: Diagnosis not present

## 2022-08-13 MED ORDER — SODIUM CHLORIDE 0.9 % IV SOLN
300.0000 mg | Freq: Once | INTRAVENOUS | Status: AC
  Administered 2022-08-13: 300 mg via INTRAVENOUS
  Filled 2022-08-13: qty 15

## 2022-08-13 MED ORDER — DIPHENHYDRAMINE HCL 25 MG PO CAPS: 25.0000 mg | ORAL_CAPSULE | Freq: Once | ORAL | Status: DC

## 2022-08-13 MED ORDER — ACETAMINOPHEN 325 MG PO TABS: 650.0000 mg | ORAL_TABLET | Freq: Once | ORAL | Status: DC

## 2022-08-13 NOTE — Progress Notes (Deleted)
Diagnosis: Iron Deficiency Anemia  Provider:  Marshell Garfinkel MD  Procedure: {Injection/Infusion:25339}  {IV Type:25393::"Peripheral"}, {IV Location:25394}  {Medications:25395}, {Dose:25397}  {Infusion Start Time:25399}  {Infusion Stop Time:25400}  Post Infusion IV Care: {CHINF Post Infusion:25398}  Discharge: {Condition:19696:::1}, {Destination:18313::"Home":1} . [id number] Provided"}  Performed by:  Oren Beckmann, RN

## 2022-08-13 NOTE — Progress Notes (Signed)
Diagnosis: Iron Deficiency Anemia  Provider:  Marshell Garfinkel MD  Procedure: Infusion  IV Type: Peripheral, IV Location: L Antecubital  Venofer (Iron Sucrose), Dose: 300 mg  Infusion Start Time: [id number] pm  Infusion Stop Time: 1523 pm  Post Infusion IV Care: Observation period completed and Peripheral IV Discontinued  Discharge: Condition: Good, Destination: Home . AVS Provided and AVS Declined  Performed by:  Oren Beckmann, RN

## 2022-08-20 ENCOUNTER — Telehealth: Payer: Self-pay | Admitting: Internal Medicine

## 2022-08-20 ENCOUNTER — Ambulatory Visit (INDEPENDENT_AMBULATORY_CARE_PROVIDER_SITE_OTHER): Payer: BC Managed Care – PPO

## 2022-08-20 VITALS — BP 146/75 | HR 79 | Temp 98.1°F | Resp 18 | Ht 70.0 in | Wt 213.4 lb

## 2022-08-20 DIAGNOSIS — D649 Anemia, unspecified: Secondary | ICD-10-CM | POA: Diagnosis not present

## 2022-08-20 MED ORDER — ACETAMINOPHEN 325 MG PO TABS: 650.0000 mg | ORAL_TABLET | Freq: Once | ORAL | Status: DC

## 2022-08-20 MED ORDER — SODIUM CHLORIDE 0.9 % IV SOLN
300.0000 mg | Freq: Once | INTRAVENOUS | Status: AC
  Administered 2022-08-20: 300 mg via INTRAVENOUS
  Filled 2022-08-20: qty 15

## 2022-08-20 MED ORDER — DIPHENHYDRAMINE HCL 25 MG PO CAPS: 25.0000 mg | ORAL_CAPSULE | Freq: Once | ORAL | Status: DC

## 2022-08-20 NOTE — Telephone Encounter (Signed)
Called patient regarding upcoming April appointments, left a voicemail.

## 2022-08-20 NOTE — Progress Notes (Signed)
Diagnosis: Iron Deficiency Anemia  Provider:  Marshell Garfinkel MD  Procedure: Infusion  IV Type: Peripheral, IV Location: L Forearm  Venofer (Iron Sucrose), Dose: 300 mg  Infusion Start Time: [id number]  Infusion Stop Time: 1518  Post Infusion IV Care: Peripheral IV Discontinued  Discharge: Condition: Good, Destination: Home . AVS Declined  Performed by:  Arnoldo Morale, RN

## 2022-09-29 ENCOUNTER — Inpatient Hospital Stay: Payer: BC Managed Care – PPO | Attending: Internal Medicine

## 2022-09-29 ENCOUNTER — Other Ambulatory Visit: Payer: Self-pay

## 2022-09-29 ENCOUNTER — Inpatient Hospital Stay (HOSPITAL_BASED_OUTPATIENT_CLINIC_OR_DEPARTMENT_OTHER): Payer: BC Managed Care – PPO | Admitting: Internal Medicine

## 2022-09-29 VITALS — BP 150/64 | HR 87 | Temp 98.4°F | Resp 15 | Wt 216.3 lb

## 2022-09-29 DIAGNOSIS — Z8546 Personal history of malignant neoplasm of prostate: Secondary | ICD-10-CM | POA: Insufficient documentation

## 2022-09-29 DIAGNOSIS — M7989 Other specified soft tissue disorders: Secondary | ICD-10-CM | POA: Insufficient documentation

## 2022-09-29 DIAGNOSIS — E119 Type 2 diabetes mellitus without complications: Secondary | ICD-10-CM | POA: Diagnosis not present

## 2022-09-29 DIAGNOSIS — Z9079 Acquired absence of other genital organ(s): Secondary | ICD-10-CM | POA: Diagnosis not present

## 2022-09-29 DIAGNOSIS — Z79899 Other long term (current) drug therapy: Secondary | ICD-10-CM | POA: Diagnosis not present

## 2022-09-29 DIAGNOSIS — Z794 Long term (current) use of insulin: Secondary | ICD-10-CM | POA: Diagnosis not present

## 2022-09-29 DIAGNOSIS — D649 Anemia, unspecified: Secondary | ICD-10-CM

## 2022-09-29 DIAGNOSIS — D509 Iron deficiency anemia, unspecified: Secondary | ICD-10-CM | POA: Diagnosis present

## 2022-09-29 DIAGNOSIS — D75838 Other thrombocytosis: Secondary | ICD-10-CM | POA: Diagnosis not present

## 2022-09-29 DIAGNOSIS — R32 Unspecified urinary incontinence: Secondary | ICD-10-CM | POA: Insufficient documentation

## 2022-09-29 DIAGNOSIS — I10 Essential (primary) hypertension: Secondary | ICD-10-CM | POA: Diagnosis not present

## 2022-09-29 LAB — CBC WITH DIFFERENTIAL (CANCER CENTER ONLY)
Abs Immature Granulocytes: 0.01 10*3/uL (ref 0.00–0.07)
Basophils Absolute: 0 10*3/uL (ref 0.0–0.1)
Basophils Relative: 1 %
Eosinophils Absolute: 0.2 10*3/uL (ref 0.0–0.5)
Eosinophils Relative: 4 %
HCT: 31.1 % — ABNORMAL LOW (ref 39.0–52.0)
Hemoglobin: 10.7 g/dL — ABNORMAL LOW (ref 13.0–17.0)
Immature Granulocytes: 0 %
Lymphocytes Relative: 16 %
Lymphs Abs: 0.7 10*3/uL (ref 0.7–4.0)
MCH: 30.5 pg (ref 26.0–34.0)
MCHC: 34.4 g/dL (ref 30.0–36.0)
MCV: 88.6 fL (ref 80.0–100.0)
Monocytes Absolute: 0.7 10*3/uL (ref 0.1–1.0)
Monocytes Relative: 16 %
Neutro Abs: 2.6 10*3/uL (ref 1.7–7.7)
Neutrophils Relative %: 63 %
Platelet Count: 309 10*3/uL (ref 150–400)
RBC: 3.51 MIL/uL — ABNORMAL LOW (ref 4.22–5.81)
RDW: 14.9 % (ref 11.5–15.5)
WBC Count: 4.2 10*3/uL (ref 4.0–10.5)
nRBC: 0 % (ref 0.0–0.2)

## 2022-09-29 LAB — IRON AND IRON BINDING CAPACITY (CC-WL,HP ONLY)
Iron: 77 ug/dL (ref 45–182)
Saturation Ratios: 23 % (ref 17.9–39.5)
TIBC: 339 ug/dL (ref 250–450)
UIBC: 262 ug/dL (ref 117–376)

## 2022-09-29 LAB — FERRITIN: Ferritin: 45 ng/mL (ref 24–336)

## 2022-09-29 NOTE — Progress Notes (Signed)
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  The patient also has a history of prostate cancer and followed by Dr. Laverle Patter and currently on treatment with Lupron and Xtandi    PRIOR THERAPY: Venofer 300 Mg IV weekly for 3 weeks.  CURRENT THERAPY: None  INTERVAL HISTORY: [name] 57 y.o. male returns to the clinic today for follow-up visit.  The patient is feeling fine today with no concerning complaints except for mild fatigue.  He continues to have the urinary incontinence and followed by Dr. Laverle Patter.  He also has swelling of the lower extremity secondary to his treatment with Xtandi.  He denied having any current chest pain, shortness of breath, cough or hemoptysis.  He has no nausea, vomiting, diarrhea or constipation.  He has no headache or visual changes.  He tolerated the iron infusion fairly well.  He is here today for evaluation and repeat blood work.  MEDICAL HISTORY: Past Medical History:  Diagnosis Date   Asthma    Cancer [facility])    prostate cancer    Diabetes mellitus without complication (HCC)    History of bronchitis    Hypertension    Prostate cancer (HCC)    Wears glasses     ALLERGIES:  has No Known Allergies.  MEDICATIONS:  Current Outpatient Medications  Medication Sig Dispense Refill   albuterol (VENTOLIN HFA) 108 (90 Base) MCG/ACT inhaler Inhale 2 puffs into the lungs every 6 (six) hours as needed for wheezing or shortness of breath.     ALPRAZolam (XANAX) 0.25 MG tablet Take 0.25 mg by mouth 3 (three) times daily as needed for anxiety.     amLODipine (NORVASC) 5 MG tablet Take 5 mg by mouth daily.     Continuous Blood Gluc Sensor (FREESTYLE LIBRE 2 SENSOR) MISC USE TO MONITOR CBG EVERY 14 DAYS AS DIRECTED      Insulin Pen Needle (B-D UF III MINI PEN NEEDLES) 31G X 5 MM MISC USE TO INJECT INSULIN 4 TIMES DAILY E10.9     Insulin Syringe-Needle U-100 (B-D INS SYRINGE 0.5CC/31GX5/16) 31G X 5/16" 0.5 ML MISC use 1 syringe as directed (does 5 injections a day     LYUMJEV TEMPO PEN 100 UNIT/ML SOPN Inject 0-12 Units into the skin as directed. Sliding Scale     oxyCODONE (ROXICODONE) 5 MG immediate release tablet Take 1 tablet (5 mg total) by mouth every 8 (eight) hours as needed. (Patient taking differently: Take 5 mg by mouth every 8 (eight) hours as needed for severe pain.) 20 tablet 0   ramipril (ALTACE) 10 MG capsule Take 10 mg by mouth daily.     rosuvastatin (CRESTOR) 5 MG tablet Take 1 tablet (5 mg total) by mouth 2 (two) times a week. Please hold  rosuvastatin for now, resume after  IV antibiotic treatment completed     TRESIBA FLEXTOUCH 200 UNIT/ML SOPN Inject 40 Units into the skin every morning.     XTANDI 40 MG tablet Take 80 mg by mouth 2 (two) times daily with breakfast and lunch. Taking first dose after breakfast & second dose after lunch. Dr Laverle Patter aware     No current facility-administered medications for this visit.    SURGICAL HISTORY:  Past Surgical History:  Procedure Laterality Date   CYSTOSCOPY WITH  FULGERATION N/A 03/21/2022   Procedure: CYSTOSCOPY WITH FULGERATION;  Surgeon: Jannifer Hick, MD;  Location: [facility];  Service: Urology;  Laterality: N/A;   CYSTOSCOPY WITH FULGERATION N/A 06/22/2022   Procedure: CYSTOSCOPY WITH FULGERATION;  Surgeon: Heloise Purpura, MD;  Location: [facility];  Service: Urology;  Laterality: N/A;  30 MINS FOR CASE   EYE SURGERY     laser eye surgery bilat    left groin hernia      LYMPHADENECTOMY Bilateral 09/02/2015   Procedure: BILATERAL LYMPHADENECTOMY;  Surgeon: Heloise Purpura, MD;  Location: [facility];  Service: Urology;  Laterality: Bilateral;   ROBOT ASSISTED LAPAROSCOPIC RADICAL PROSTATECTOMY N/A 09/02/2015   Procedure: XI ROBOTIC ASSISTED LAPAROSCOPIC  RADICAL PROSTATECTOMY LEVEL 2;  Surgeon: Heloise Purpura, MD;  Location: [facility];  Service: Urology;  Laterality: N/A;   schwannoma     removed approx 16 to 18 years ago    TEE WITHOUT CARDIOVERSION N/A 07/15/2022   Procedure: TRANSESOPHAGEAL ECHOCARDIOGRAM (TEE);  Surgeon: Clotilde Dieter, DO;  Location: [facility];  Service: Cardiovascular;  Laterality: N/A;   TONSILLECTOMY      REVIEW OF SYSTEMS:  A comprehensive review of systems was negative except for: Constitutional: positive for fatigue   PHYSICAL EXAMINATION: General appearance: alert, cooperative, fatigued, and no distress Head: Normocephalic, without obvious abnormality, atraumatic Neck: no adenopathy, no JVD, supple, symmetrical, trachea midline, and thyroid not enlarged, symmetric, no tenderness/mass/nodules Lymph nodes: Cervical, supraclavicular, and axillary nodes normal. Resp: clear to auscultation bilaterally Back: symmetric, no curvature. ROM normal. No CVA tenderness. Cardio: regular rate and rhythm, S1, S2 normal, no murmur, click, rub or gallop GI: soft, non-tender; bowel sounds normal; no masses,  no organomegaly Extremities: extremities normal, atraumatic, no cyanosis or edema  ECOG PERFORMANCE STATUS: 1 - Symptomatic but completely ambulatory  Blood pressure (!) 150/64, pulse 87, temperature 98.4 F (36.9 C), temperature source Oral, resp. rate 15, weight 216 lb 4.8 oz (98.1 kg), SpO2 100 %.  LABORATORY DATA: Lab Results  Component Value Date   WBC 4.2 09/29/2022   HGB 10.7 (L) 09/29/2022   HCT 31.1 (L) 09/29/2022   MCV 88.6 09/29/2022   PLT 309 09/29/2022      Chemistry      Component Value Date/Time   NA 134 (L) 07/29/2022 1115   K 4.2 07/29/2022 1115   CL 100 07/29/2022 1115   CO2 27 07/29/2022 1115   BUN 15 07/29/2022 1115   CREATININE 1.04 07/29/2022 1115      Component Value Date/Time   CALCIUM 9.6 07/29/2022 1115   ALKPHOS 129 (H) 07/29/2022 1115   AST 15 07/29/2022 1115   ALT 12  07/29/2022 1115   BILITOT 0.2 (L) 07/29/2022 1115       RADIOGRAPHIC STUDIES: No results found.  ASSESSMENT AND PLAN: This is a very pleasant 57 years old white male with iron deficiency anemia and reactive thrombocytosis secondary to hematuria and blood loss secondary to radiation cystitis. The patient was treated with iron infusion with Venofer 300 Mg IV weekly for 3 weeks and tolerated this treatment well except for hypoglycemia during the first 2 doses. Repeat CBC today showed improvement of his hemoglobin up to 10.7 and hematocrit 31.1%. Iron studies showed improvement of his serum iron to 77 with iron saturation of 23%. Ferritin level is still pending. I recommended for the patient to continue on observation with repeat CBC, iron study and ferritin in 3 months. He was advised to call immediately if he has any other concerning symptoms  in the interval. The patient voices understanding of current disease status and treatment options and is in agreement with the current care plan.  All questions were answered. The patient knows to call the clinic with any problems, questions or concerns. We can certainly see the patient much sooner if necessary.  The total time spent in the appointment was 20 minutes.  Disclaimer: This note was dictated with voice recognition software. Similar sounding words can inadvertently be transcribed and may not be corrected upon review.

## 2022-11-05 ENCOUNTER — Ambulatory Visit (HOSPITAL_COMMUNITY)
Admission: RE | Admit: 2022-11-05 | Discharge: 2022-11-05 | Disposition: A | Discharge status: Home / Self Care | Payer: BC Managed Care – PPO | Source: Ambulatory Visit | Attending: Urology | Admitting: Urology

## 2022-11-05 ENCOUNTER — Other Ambulatory Visit (HOSPITAL_COMMUNITY): Payer: Self-pay | Admitting: Urology

## 2022-11-05 DIAGNOSIS — C61 Malignant neoplasm of prostate: Secondary | ICD-10-CM | POA: Insufficient documentation

## 2022-11-19 NOTE — Progress Notes (Signed)
[name] [address] [place] [address] Phone: [phone]   Assessment and Plan:     1. Rib pain on left side 2. Rib pain on right side 3. Somatic dysfunction of thoracic region 4. Somatic dysfunction of rib region 5. Somatic dysfunction of upper extremities -Acute, initial sports medicine visit - Most consistent with rib cage dysfunction bilaterally causing upper back pain radiating around the sides to front side of chest.  Cause is currently unclear, though could have occurred from sleeping, coughing, physical activity - Start prednisone Dosepak.  Educated that this will elevate patient's blood glucose with past medical history of DM type I.  Patient should be cautious that he needs to adjust his insulin injections to compensate and should have a high sugar snack on hand in case of lows while taking medication - Do not recommend NSAID use as patient has past medical history of radiation cystitis and when he previously used Aleve, he had large blood clot formation in bladder requiring catheterization - Patient elected to trial OMT today.  Tolerated well per note below.  Patient had complete resolution of right-sided pain with mild improvement in left-sided pain after treatment - Decision today to treat with OMT was based on Physical Exam  After verbal consent patient was treated with HVLA (high velocity low amplitude), ME (muscle energy), FPR (flex positional release), ST (soft tissue),   techniques in upper extremity, rib, thoracic, . Patient tolerated the procedure well with improvement in symptoms.  Patient educated on potential side effects of soreness and recommended to rest, hydrate, and use Tylenol as needed for pain control.   Other orders - methylPREDNISolone (MEDROL DOSEPAK) 4 MG TBPK tablet; Take 6 tablets on day 1.  Take 5 tablets on day 2.  Take 4 tablets on day 3.  Take 3 tablets on day 4.  Take 2 tablets on day 5.   Take 1 tablet on day 6.    Pertinent previous records reviewed include chest x-ray 11/05/2022, oncology note 09/29/2022, hemoglobin A1c 7.7 on 03/21/2022   Follow Up: 2 weeks for reevaluation.  Could consider repeat OMT   Subjective:   I, [name], am serving as a Neurosurgeon for [name]  Chief Complaint: thoracic back pain   HPI:   11/20/2022 Patient is a 57 year old male complaining of thoracic back pain. Patient states hx of cancer , radiation cystitis , oct admitted to hospital due to radiation , 3 weeks ago he had pain with laughing , deep breaths, strained lat muscle right side, pec left side, he gets a shot every six months pituitary blocker, cancer is undetectable at the moment , he is taking tylenol for the pain but doesn't help much, can't take Advil   Relevant Historical Information: DM type I, history of prostate cancer with mets to bone status posttreatment and currently in remission according to patient.  Radiation cystitis  Additional pertinent review of systems negative.   Current Outpatient Medications:    albuterol (VENTOLIN HFA) 108 (90 Base) MCG/ACT inhaler, Inhale 2 puffs into the lungs every 6 (six) hours as needed for wheezing or shortness of breath., Disp: , Rfl:    ALPRAZolam (XANAX) 0.25 MG tablet, Take 0.25 mg by mouth 3 (three) times daily as needed for anxiety., Disp: , Rfl:    amLODipine (NORVASC) 5 MG tablet, Take 5 mg by mouth daily., Disp: , Rfl:    Continuous Blood Gluc Sensor (FREESTYLE LIBRE 2  SENSOR) MISC, USE TO MONITOR CBG EVERY 14 DAYS AS DIRECTED, Disp: , Rfl:    Insulin Pen Needle (B-D UF III MINI PEN NEEDLES) 31G X 5 MM MISC, USE TO INJECT INSULIN 4 TIMES DAILY E10.9, Disp: , Rfl:    Insulin Syringe-Needle U-100 (B-D INS SYRINGE 0.5CC/31GX5/16) 31G X 5/16" 0.5 ML MISC, use 1 syringe as directed (does 5 injections a day, Disp: , Rfl:    LYUMJEV TEMPO PEN 100 UNIT/ML SOPN, Inject 0-12 Units into the skin as directed. Sliding Scale,  Disp: , Rfl:    methylPREDNISolone (MEDROL DOSEPAK) 4 MG TBPK tablet, Take 6 tablets on day 1.  Take 5 tablets on day 2.  Take 4 tablets on day 3.  Take 3 tablets on day 4.  Take 2 tablets on day 5.  Take 1 tablet on day 6., Disp: 21 tablet, Rfl: 0   ramipril (ALTACE) 10 MG capsule, Take 10 mg by mouth daily., Disp: , Rfl:    rosuvastatin (CRESTOR) 5 MG tablet, Take 1 tablet (5 mg total) by mouth 2 (two) times a week. Please hold  rosuvastatin for now, resume after  IV antibiotic treatment completed, Disp: , Rfl:    TRESIBA FLEXTOUCH 200 UNIT/ML SOPN, Inject 40 Units into the skin every morning., Disp: , Rfl:    XTANDI 40 MG tablet, Take 80 mg by mouth 2 (two) times daily with breakfast and lunch. Taking first dose after breakfast & second dose after lunch. Dr Laverle Patter aware, Disp: , Rfl:    Objective:     Vitals:   11/20/22 0756  BP: 132/78  Pulse: 88  SpO2: 98%  Weight: 217 lb (98.4 kg)  Height: 5\' 10"  (1.778 m)      Body mass index is 31.14 kg/m.    Physical Exam:    General: Well-appearing, cooperative, sitting comfortably in no acute distress.   OMT Physical Exam:  Rib: Bilateral elevated first rib with TTP Thoracic: TTP paraspinal, T4 RL SL, T5-7 RRSL, T8-10 RLSR Upper extremity: Pain along right-sided rib cage with right shoulder flexion and abduction   Back - Normal skin, Spine with normal alignment and no deformity.   No tenderness to vertebral process palpation.   Bilateral thoracic paraspinous muscles are tender and without spasm, worse on right  Electronically signed by:  [name] 9:05 AM 11/20/22

## 2022-11-20 ENCOUNTER — Ambulatory Visit (INDEPENDENT_AMBULATORY_CARE_PROVIDER_SITE_OTHER): Payer: BC Managed Care – PPO | Admitting: Sports Medicine

## 2022-11-20 VITALS — BP 132/78 | HR 88 | Ht 70.0 in | Wt 217.0 lb

## 2022-11-20 DIAGNOSIS — M9902 Segmental and somatic dysfunction of thoracic region: Secondary | ICD-10-CM

## 2022-11-20 DIAGNOSIS — M9908 Segmental and somatic dysfunction of rib cage: Secondary | ICD-10-CM

## 2022-11-20 DIAGNOSIS — M9907 Segmental and somatic dysfunction of upper extremity: Secondary | ICD-10-CM

## 2022-11-20 DIAGNOSIS — R0781 Pleurodynia: Secondary | ICD-10-CM | POA: Diagnosis not present

## 2022-11-20 MED ORDER — METHYLPREDNISOLONE 4 MG PO TBPK: ORAL_TABLET | ORAL | 0 refills | Status: AC

## 2022-11-20 NOTE — Patient Instructions (Addendum)
Good to see you  Prednisone dos pak  Carefully watch blood glucose , it will elevate while on prednisone 2 week follow up

## 2022-12-03 NOTE — Progress Notes (Signed)
Aleen Sells D.[facility] [address] [place] [address] Phone: [phone]   Assessment and Plan:     1. Rib pain on left side 2. Rib pain on right side  -Acute, improving, subsequent visit - Significant improvement in bilateral rib cage pain and dysfunction after OMT produced several articulations and completing prednisone pack - With patient "90%" improved and only rare and intermittent pain, we elected to not repeat OMT at today's visit - May discontinue prednisone at this time  Pertinent previous records reviewed include none   Follow Up: As needed   Subjective:   I, Jerene Canny, am serving as a Neurosurgeon for Doctor Richardean Sale   Chief Complaint: thoracic back pain    HPI:    11/20/2022 Patient is a 57 year old male complaining of thoracic back pain. 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  Radiation cystitis Additional pertinent review of systems negative.   Current Outpatient Medications:    albuterol (VENTOLIN HFA) 108 (90 Base) MCG/ACT inhaler, Inhale 2 puffs into the lungs every 6 (six) hours as needed for wheezing or shortness of breath., Disp: , Rfl:    ALPRAZolam (XANAX) 0.25 MG tablet, Take 0.25 mg by mouth 3 (three) times daily as needed for anxiety., Disp: , Rfl:    amLODipine (NORVASC) 5 MG tablet, Take 5 mg by mouth daily., Disp: , Rfl:    Continuous Blood Gluc  Sensor (FREESTYLE LIBRE 2 SENSOR) MISC, USE TO MONITOR CBG EVERY 14 DAYS AS DIRECTED, Disp: , Rfl:    Insulin Pen Needle (B-D UF III MINI PEN NEEDLES) 31G X 5 MM MISC, USE TO INJECT INSULIN 4 TIMES DAILY E10.9, Disp: , Rfl:    Insulin Syringe-Needle U-100 (B-D INS SYRINGE 0.5CC/31GX5/16) 31G X 5/16" 0.5 ML MISC, use 1 syringe as directed (does 5 injections a day, Disp: , Rfl:    LYUMJEV TEMPO PEN 100 UNIT/ML SOPN, Inject 0-12 Units into the skin as directed. Sliding Scale, Disp: , Rfl:    methylPREDNISolone (MEDROL DOSEPAK) 4 MG TBPK tablet, Take 6 tablets on day 1.  Take 5 tablets on day 2.  Take 4 tablets on day 3.  Take 3 tablets on day 4.  Take 2 tablets on day 5.  Take 1 tablet on day 6., Disp: 21 tablet, Rfl: 0   ramipril (ALTACE) 10 MG capsule, Take 10 mg by mouth daily., Disp: , Rfl:    rosuvastatin (CRESTOR) 5 MG tablet, Take 1 tablet (5 mg total) by mouth 2 (two) times a week. Please hold  rosuvastatin for now, resume after  IV antibiotic treatment completed, Disp: , Rfl:    TRESIBA FLEXTOUCH 200 UNIT/ML SOPN, Inject 40 Units into the skin every morning., Disp: , Rfl:    XTANDI 40 MG tablet, Take 80 mg by mouth 2 (two) times daily with breakfast and lunch. Taking first dose after breakfast & second  dose after lunch. Dr Laverle Patter aware, Disp: , Rfl:    Objective:     Vitals:   12/04/22 0811  BP: 122/80  Pulse: 93  SpO2: 98%  Weight: 217 lb (98.4 kg)  Height: 5\' 10"  (1.778 m)      Body mass index is 31.14 kg/m.    Physical Exam:    Gen: Appears well, nad, nontoxic and pleasant Psych: Alert and oriented, appropriate mood and affect Neuro: sensation intact, strength is 5/5 in upper and lower extremities, muscle tone wnl Skin: no susupicious lesions or rashes  Back - Normal skin, Spine with normal alignment and no deformity.   No tenderness to vertebral process palpation.   Paraspinous muscles are not tender and without spasm     Electronically signed by:  Aleen Sells D.[facility] 8:27 AM 12/04/22

## 2022-12-04 ENCOUNTER — Ambulatory Visit (INDEPENDENT_AMBULATORY_CARE_PROVIDER_SITE_OTHER): Payer: BC Managed Care – PPO | Admitting: Sports Medicine

## 2022-12-04 VITALS — BP 122/80 | HR 93 | Ht 70.0 in | Wt 217.0 lb

## 2022-12-04 DIAGNOSIS — R0781 Pleurodynia: Secondary | ICD-10-CM | POA: Diagnosis not present
